# Patient Record
Sex: Female | Born: 1950
Health system: Southern US, Community
[De-identification: ages and names within clinical notes are randomized; demographics above are authoritative.]

## PROBLEM LIST (undated history)

## (undated) DIAGNOSIS — M5030 Other cervical disc degeneration, unspecified cervical region: Secondary | ICD-10-CM

## (undated) DIAGNOSIS — K5790 Diverticulosis of intestine, part unspecified, without perforation or abscess without bleeding: Secondary | ICD-10-CM

## (undated) DIAGNOSIS — G4730 Sleep apnea, unspecified: Secondary | ICD-10-CM

## (undated) DIAGNOSIS — Z8614 Personal history of Methicillin resistant Staphylococcus aureus infection: Secondary | ICD-10-CM

## (undated) DIAGNOSIS — L409 Psoriasis, unspecified: Secondary | ICD-10-CM

## (undated) DIAGNOSIS — R05 Cough: Secondary | ICD-10-CM

## (undated) DIAGNOSIS — I10 Essential (primary) hypertension: Secondary | ICD-10-CM

## (undated) DIAGNOSIS — N189 Chronic kidney disease, unspecified: Secondary | ICD-10-CM

## (undated) DIAGNOSIS — J45909 Unspecified asthma, uncomplicated: Secondary | ICD-10-CM

## (undated) DIAGNOSIS — K219 Gastro-esophageal reflux disease without esophagitis: Secondary | ICD-10-CM

## (undated) DIAGNOSIS — D631 Anemia in chronic kidney disease: Secondary | ICD-10-CM

## (undated) DIAGNOSIS — T7840XA Allergy, unspecified, initial encounter: Secondary | ICD-10-CM

## (undated) DIAGNOSIS — R42 Dizziness and giddiness: Secondary | ICD-10-CM

## (undated) DIAGNOSIS — E785 Hyperlipidemia, unspecified: Secondary | ICD-10-CM

## (undated) DIAGNOSIS — D126 Benign neoplasm of colon, unspecified: Secondary | ICD-10-CM

## (undated) DIAGNOSIS — F419 Anxiety disorder, unspecified: Secondary | ICD-10-CM

## (undated) DIAGNOSIS — F329 Major depressive disorder, single episode, unspecified: Secondary | ICD-10-CM

## (undated) DIAGNOSIS — G4700 Insomnia, unspecified: Secondary | ICD-10-CM

## (undated) DIAGNOSIS — Z860100 Personal history of colon polyps, unspecified: Secondary | ICD-10-CM

## (undated) DIAGNOSIS — R059 Cough, unspecified: Secondary | ICD-10-CM

## (undated) DIAGNOSIS — J302 Other seasonal allergic rhinitis: Secondary | ICD-10-CM

## (undated) DIAGNOSIS — M5137 Other intervertebral disc degeneration, lumbosacral region: Secondary | ICD-10-CM

## (undated) DIAGNOSIS — F32A Depression, unspecified: Secondary | ICD-10-CM

## (undated) DIAGNOSIS — E1140 Type 2 diabetes mellitus with diabetic neuropathy, unspecified: Secondary | ICD-10-CM

## (undated) DIAGNOSIS — Z8709 Personal history of other diseases of the respiratory system: Secondary | ICD-10-CM

## (undated) DIAGNOSIS — M1990 Unspecified osteoarthritis, unspecified site: Secondary | ICD-10-CM

## (undated) DIAGNOSIS — R112 Nausea with vomiting, unspecified: Secondary | ICD-10-CM

## (undated) DIAGNOSIS — R51 Headache: Secondary | ICD-10-CM

## (undated) DIAGNOSIS — Z9889 Other specified postprocedural states: Secondary | ICD-10-CM

## (undated) HISTORY — DX: Anemia in chronic kidney disease: D63.1

## (undated) HISTORY — PX: SHOULDER ARTHROSCOPY: SHX128

## (undated) HISTORY — PX: COLONOSCOPY: SHX174

## (undated) HISTORY — PX: CARDIAC CATHETERIZATION: SHX172

## (undated) HISTORY — DX: Chronic kidney disease, unspecified: N18.9

## (undated) HISTORY — DX: Other cervical disc degeneration, unspecified cervical region: M50.30

## (undated) HISTORY — DX: Benign neoplasm of colon, unspecified: D12.6

## (undated) HISTORY — PX: ELBOW SURGERY: SHX618

## (undated) HISTORY — PX: DILATION AND CURETTAGE OF UTERUS: SHX78

## (undated) HISTORY — PX: POLYPECTOMY: SHX149

## (undated) HISTORY — DX: Diverticulosis of intestine, part unspecified, without perforation or abscess without bleeding: K57.90

## (undated) HISTORY — PX: ESOPHAGOGASTRODUODENOSCOPY: SHX1529

## (undated) HISTORY — PX: TOE AMPUTATION: SHX809

## (undated) HISTORY — DX: Essential (primary) hypertension: I10

## (undated) HISTORY — DX: Allergy, unspecified, initial encounter: T78.40XA

## (undated) HISTORY — DX: Psoriasis, unspecified: L40.9

## (undated) HISTORY — DX: Other intervertebral disc degeneration, lumbosacral region: M51.37

## (undated) HISTORY — DX: Unspecified osteoarthritis, unspecified site: M19.90

## (undated) HISTORY — PX: BACK SURGERY: SHX140

## (undated) HISTORY — PX: ABDOMINAL HYSTERECTOMY: SHX81

## (undated) HISTORY — PX: CERVICAL SPINE SURGERY: SHX589

## (undated) HISTORY — PX: OTHER SURGICAL HISTORY: SHX169

---

## 1975-05-08 HISTORY — PX: CHOLECYSTECTOMY: SHX55

## 1998-03-07 ENCOUNTER — Other Ambulatory Visit: Admission: RE | Admit: 1998-03-07 | Discharge: 1998-03-07 | Payer: Self-pay | Admitting: Obstetrics & Gynecology

## 1998-09-01 ENCOUNTER — Encounter: Admission: RE | Admit: 1998-09-01 | Discharge: 1998-11-24 | Payer: Self-pay | Admitting: Anesthesiology

## 1999-05-02 ENCOUNTER — Other Ambulatory Visit: Admission: RE | Admit: 1999-05-02 | Discharge: 1999-05-02 | Payer: Self-pay | Admitting: Obstetrics & Gynecology

## 1999-06-04 ENCOUNTER — Emergency Department (HOSPITAL_COMMUNITY): Admission: EM | Admit: 1999-06-04 | Discharge: 1999-06-04 | Payer: Self-pay | Admitting: Podiatry

## 1999-07-30 ENCOUNTER — Emergency Department (HOSPITAL_COMMUNITY): Admission: EM | Admit: 1999-07-30 | Discharge: 1999-07-30 | Payer: Self-pay

## 1999-08-10 ENCOUNTER — Encounter: Payer: Self-pay | Admitting: Family Medicine

## 1999-08-10 ENCOUNTER — Encounter: Admission: RE | Admit: 1999-08-10 | Discharge: 1999-08-10 | Payer: Self-pay | Admitting: Family Medicine

## 1999-09-08 ENCOUNTER — Encounter: Admission: RE | Admit: 1999-09-08 | Discharge: 1999-09-08 | Payer: Self-pay | Admitting: Neurological Surgery

## 1999-09-08 ENCOUNTER — Encounter: Payer: Self-pay | Admitting: Neurological Surgery

## 2000-04-04 ENCOUNTER — Other Ambulatory Visit: Admission: RE | Admit: 2000-04-04 | Discharge: 2000-04-04 | Payer: Self-pay | Admitting: Obstetrics & Gynecology

## 2000-05-08 ENCOUNTER — Encounter: Admission: RE | Admit: 2000-05-08 | Discharge: 2000-06-19 | Payer: Self-pay | Admitting: Orthopedic Surgery

## 2000-08-21 ENCOUNTER — Encounter: Admission: RE | Admit: 2000-08-21 | Discharge: 2000-11-19 | Payer: Self-pay | Admitting: Orthopedic Surgery

## 2000-10-09 ENCOUNTER — Encounter: Admission: RE | Admit: 2000-10-09 | Discharge: 2000-10-09 | Payer: Self-pay | Admitting: Orthopedic Surgery

## 2000-10-09 ENCOUNTER — Encounter: Payer: Self-pay | Admitting: Orthopedic Surgery

## 2001-04-09 ENCOUNTER — Other Ambulatory Visit: Admission: RE | Admit: 2001-04-09 | Discharge: 2001-04-09 | Payer: Self-pay | Admitting: Obstetrics and Gynecology

## 2001-05-01 ENCOUNTER — Encounter: Admission: RE | Admit: 2001-05-01 | Discharge: 2001-05-01 | Payer: Self-pay | Admitting: Orthopaedic Surgery

## 2001-05-01 ENCOUNTER — Encounter: Payer: Self-pay | Admitting: Orthopaedic Surgery

## 2001-06-10 ENCOUNTER — Ambulatory Visit (HOSPITAL_BASED_OUTPATIENT_CLINIC_OR_DEPARTMENT_OTHER): Admission: RE | Admit: 2001-06-10 | Discharge: 2001-06-10 | Payer: Self-pay | Admitting: Orthopaedic Surgery

## 2002-08-06 ENCOUNTER — Other Ambulatory Visit: Admission: RE | Admit: 2002-08-06 | Discharge: 2002-08-06 | Payer: Self-pay | Admitting: Obstetrics and Gynecology

## 2002-08-27 ENCOUNTER — Encounter: Admission: RE | Admit: 2002-08-27 | Discharge: 2002-11-25 | Payer: Self-pay | Admitting: Family Medicine

## 2004-07-17 ENCOUNTER — Ambulatory Visit: Admission: RE | Admit: 2004-07-17 | Discharge: 2004-07-17 | Payer: Self-pay | Admitting: Neurosurgery

## 2004-10-27 ENCOUNTER — Inpatient Hospital Stay (HOSPITAL_COMMUNITY): Admission: RE | Admit: 2004-10-27 | Discharge: 2004-10-29 | Payer: Self-pay | Admitting: Neurosurgery

## 2005-01-09 ENCOUNTER — Encounter: Admission: RE | Admit: 2005-01-09 | Discharge: 2005-01-09 | Payer: Self-pay | Admitting: Neurosurgery

## 2005-04-17 ENCOUNTER — Encounter: Admission: RE | Admit: 2005-04-17 | Discharge: 2005-04-17 | Payer: Self-pay | Admitting: Neurosurgery

## 2006-09-05 ENCOUNTER — Encounter: Admission: RE | Admit: 2006-09-05 | Discharge: 2006-09-05 | Payer: Self-pay | Admitting: Neurosurgery

## 2006-09-08 ENCOUNTER — Encounter: Admission: RE | Admit: 2006-09-08 | Discharge: 2006-09-08 | Payer: Self-pay | Admitting: Neurosurgery

## 2008-05-07 HISTORY — PX: CARPAL TUNNEL RELEASE: SHX101

## 2008-09-15 ENCOUNTER — Ambulatory Visit (HOSPITAL_COMMUNITY): Admission: RE | Admit: 2008-09-15 | Discharge: 2008-09-15 | Payer: Self-pay | Admitting: Neurosurgery

## 2008-10-25 ENCOUNTER — Ambulatory Visit (HOSPITAL_COMMUNITY): Admission: RE | Admit: 2008-10-25 | Discharge: 2008-10-26 | Payer: Self-pay | Admitting: Neurosurgery

## 2010-06-27 ENCOUNTER — Other Ambulatory Visit: Payer: Self-pay | Admitting: Orthopaedic Surgery

## 2010-06-27 DIAGNOSIS — M25512 Pain in left shoulder: Secondary | ICD-10-CM

## 2010-06-29 ENCOUNTER — Ambulatory Visit
Admission: RE | Admit: 2010-06-29 | Discharge: 2010-06-29 | Disposition: A | Discharge status: Home / Self Care | Payer: BC Managed Care – PPO | Source: Ambulatory Visit | Attending: Orthopaedic Surgery | Admitting: Orthopaedic Surgery

## 2010-06-29 DIAGNOSIS — M25512 Pain in left shoulder: Secondary | ICD-10-CM

## 2010-08-14 LAB — POCT I-STAT EG7
Calcium, Ion: 1.18 mmol/L (ref 1.12–1.32)
Hemoglobin: 9.5 g/dL — ABNORMAL LOW (ref 12.0–15.0)
O2 Saturation: 86 %
Potassium: 4.3 mEq/L (ref 3.5–5.1)
Sodium: 139 mEq/L (ref 135–145)
pH, Ven: 7.401 — ABNORMAL HIGH (ref 7.250–7.300)

## 2010-08-14 LAB — COMPREHENSIVE METABOLIC PANEL
Albumin: 3.4 g/dL — ABNORMAL LOW (ref 3.5–5.2)
BUN: 20 mg/dL (ref 6–23)
CO2: 27 mEq/L (ref 19–32)
Calcium: 9.3 mg/dL (ref 8.4–10.5)
Chloride: 102 mEq/L (ref 96–112)
Sodium: 139 mEq/L (ref 135–145)
Total Bilirubin: 0.4 mg/dL (ref 0.3–1.2)
Total Protein: 6.2 g/dL (ref 6.0–8.3)

## 2010-08-14 LAB — CBC
HCT: 30.9 % — ABNORMAL LOW (ref 36.0–46.0)
Hemoglobin: 10.6 g/dL — ABNORMAL LOW (ref 12.0–15.0)
RBC: 3.35 MIL/uL — ABNORMAL LOW (ref 3.87–5.11)
WBC: 8.9 10*3/uL (ref 4.0–10.5)

## 2010-08-14 LAB — GLUCOSE, CAPILLARY
Glucose-Capillary: 146 mg/dL — ABNORMAL HIGH (ref 70–99)
Glucose-Capillary: 160 mg/dL — ABNORMAL HIGH (ref 70–99)
Glucose-Capillary: 220 mg/dL — ABNORMAL HIGH (ref 70–99)

## 2010-08-15 LAB — CBC
HCT: 30.2 % — ABNORMAL LOW (ref 36.0–46.0)
MCV: 92 fL (ref 78.0–100.0)
Platelets: 206 10*3/uL (ref 150–400)
WBC: 5.2 10*3/uL (ref 4.0–10.5)

## 2010-08-15 LAB — BASIC METABOLIC PANEL
CO2: 27 mEq/L (ref 19–32)
Chloride: 102 mEq/L (ref 96–112)
GFR calc Af Amer: >60 mL/min (ref >60)
GFR calc non Af Amer: >60 mL/min (ref >60)

## 2010-09-11 ENCOUNTER — Encounter (HOSPITAL_COMMUNITY)
Admission: RE | Admit: 2010-09-11 | Discharge: 2010-09-11 | Disposition: A | Discharge status: Home / Self Care | Payer: BC Managed Care – PPO | Source: Ambulatory Visit | Attending: Neurosurgery | Admitting: Neurosurgery

## 2010-09-11 ENCOUNTER — Ambulatory Visit (HOSPITAL_COMMUNITY)
Admission: RE | Admit: 2010-09-11 | Discharge: 2010-09-11 | Disposition: A | Discharge status: Home / Self Care | Payer: BC Managed Care – PPO | Source: Ambulatory Visit | Attending: Neurosurgery | Admitting: Neurosurgery

## 2010-09-11 ENCOUNTER — Other Ambulatory Visit (HOSPITAL_COMMUNITY): Payer: Self-pay | Admitting: Neurosurgery

## 2010-09-11 DIAGNOSIS — E119 Type 2 diabetes mellitus without complications: Secondary | ICD-10-CM | POA: Insufficient documentation

## 2010-09-11 DIAGNOSIS — M5020 Other cervical disc displacement, unspecified cervical region: Secondary | ICD-10-CM

## 2010-09-11 DIAGNOSIS — Z01818 Encounter for other preprocedural examination: Secondary | ICD-10-CM | POA: Insufficient documentation

## 2010-09-11 DIAGNOSIS — Z01812 Encounter for preprocedural laboratory examination: Secondary | ICD-10-CM | POA: Insufficient documentation

## 2010-09-11 DIAGNOSIS — R05 Cough: Secondary | ICD-10-CM | POA: Insufficient documentation

## 2010-09-11 DIAGNOSIS — G5600 Carpal tunnel syndrome, unspecified upper limb: Secondary | ICD-10-CM | POA: Insufficient documentation

## 2010-09-11 DIAGNOSIS — R059 Cough, unspecified: Secondary | ICD-10-CM | POA: Insufficient documentation

## 2010-09-11 DIAGNOSIS — G5601 Carpal tunnel syndrome, right upper limb: Secondary | ICD-10-CM

## 2010-09-11 DIAGNOSIS — I10 Essential (primary) hypertension: Secondary | ICD-10-CM | POA: Insufficient documentation

## 2010-09-11 DIAGNOSIS — Z01810 Encounter for preprocedural cardiovascular examination: Secondary | ICD-10-CM | POA: Insufficient documentation

## 2010-09-11 LAB — CBC
MCV: 91.6 fL (ref 78.0–100.0)
Platelets: 254 10*3/uL (ref 150–400)
WBC: 8.6 10*3/uL (ref 4.0–10.5)

## 2010-09-11 LAB — BASIC METABOLIC PANEL
Chloride: 102 mEq/L (ref 96–112)
Creatinine, Ser: 1.03 mg/dL (ref 0.4–1.2)
GFR calc Af Amer: >60 mL/min (ref >60)
Glucose, Bld: 219 mg/dL — ABNORMAL HIGH (ref 70–99)
Potassium: 5 mEq/L (ref 3.5–5.1)

## 2010-09-18 ENCOUNTER — Ambulatory Visit (HOSPITAL_COMMUNITY)
Admission: RE | Admit: 2010-09-18 | Discharge: 2010-09-19 | Disposition: A | Discharge status: Home / Self Care | Payer: BC Managed Care – PPO | Source: Ambulatory Visit | Attending: Neurosurgery | Admitting: Neurosurgery

## 2010-09-18 ENCOUNTER — Observation Stay (HOSPITAL_COMMUNITY): Payer: BC Managed Care – PPO

## 2010-09-18 DIAGNOSIS — M47812 Spondylosis without myelopathy or radiculopathy, cervical region: Secondary | ICD-10-CM | POA: Insufficient documentation

## 2010-09-18 DIAGNOSIS — E669 Obesity, unspecified: Secondary | ICD-10-CM | POA: Insufficient documentation

## 2010-09-18 DIAGNOSIS — Z01810 Encounter for preprocedural cardiovascular examination: Secondary | ICD-10-CM | POA: Insufficient documentation

## 2010-09-18 DIAGNOSIS — Z23 Encounter for immunization: Secondary | ICD-10-CM | POA: Insufficient documentation

## 2010-09-18 DIAGNOSIS — Z01818 Encounter for other preprocedural examination: Secondary | ICD-10-CM | POA: Insufficient documentation

## 2010-09-18 DIAGNOSIS — G5600 Carpal tunnel syndrome, unspecified upper limb: Secondary | ICD-10-CM | POA: Insufficient documentation

## 2010-09-18 DIAGNOSIS — M25519 Pain in unspecified shoulder: Secondary | ICD-10-CM | POA: Insufficient documentation

## 2010-09-18 DIAGNOSIS — E119 Type 2 diabetes mellitus without complications: Secondary | ICD-10-CM | POA: Insufficient documentation

## 2010-09-18 DIAGNOSIS — G4733 Obstructive sleep apnea (adult) (pediatric): Secondary | ICD-10-CM | POA: Insufficient documentation

## 2010-09-18 DIAGNOSIS — K219 Gastro-esophageal reflux disease without esophagitis: Secondary | ICD-10-CM | POA: Insufficient documentation

## 2010-09-18 DIAGNOSIS — I10 Essential (primary) hypertension: Secondary | ICD-10-CM | POA: Insufficient documentation

## 2010-09-18 DIAGNOSIS — Z01812 Encounter for preprocedural laboratory examination: Secondary | ICD-10-CM | POA: Insufficient documentation

## 2010-09-18 LAB — GLUCOSE, CAPILLARY
Glucose-Capillary: 253 mg/dL — ABNORMAL HIGH (ref 70–99)
Glucose-Capillary: 264 mg/dL — ABNORMAL HIGH (ref 70–99)

## 2010-09-19 LAB — GLUCOSE, CAPILLARY: Glucose-Capillary: 174 mg/dL — ABNORMAL HIGH (ref 70–99)

## 2010-09-19 NOTE — Op Note (Signed)
[name], [name]               ACCOUNT NO.:  [id number]   MEDICAL RECORD NO.:  [id number]          PATIENT TYPE:  INP   LOCATION:  3533                         FACILITY:  [facility]   PHYSICIAN:  Donalee Citrin, M.D.        DATE OF BIRTH:  Oct 30, 1950   DATE OF PROCEDURE:  10/25/2008  DATE OF DISCHARGE:                               OPERATIVE REPORT   PREOPERATIVE DIAGNOSIS:  Cervical spondylosis with stenosis on the left  side at C5, C6, and C7 radiculopathies from severe stenosis at C4-5, C5-  6, and C6-7.   PROCEDURE:  Intracervical diskectomies and fusion at C4-5, C5-6, and C6-  7 using 6-mm allograft at C4-5 and 7 mm at C5-6 and C6-7.   SURGEON:  Donalee Citrin, MD   ASSISTANT:  Kathaleen Maser. Pool, MD   ANESTHESIA:  General endotracheal.   HISTORY OF PRESENT ILLNESS:  The patient is a very pleasant 60 year old  female who has had a long-standing neck and bilateral arm pain, worse in  the left shoulder and left arm, going on now for several weeks to  months, failed all forms of conservative treatment.  MRI scan showed  severe cervical spondylosis with stenosis at C4-5, 5-6, and 6-7 with  spinal cord compression and biforaminal stenosis but worse on the left.  The patient failed conservative treatment and was recommended anterior  cervical diskectomy with fusion.  Risks and benefits of the operation  were explained to the patient.  She understand and agree to proceed  forward.   The patient was brought to the OR and induced under anesthesia,  positioned supine with the neck in slight extension, 5 pounds of Holter  traction.  The right side of the neck was prepped and draped in the  routine sterile fashion.  Preop x-ray localized to the appropriate  level.  A curvilinear incision was made just off to the midline to the  anterior border of the sternocleidomastoid.  The superficial layer of  the platysma was dissected out and divided longitudinally.  The  avascular plane between the  sternocleidomastoid and strap muscles was  developed down to the prevertebral fascia.  Prevertebral fascia was then  dissected with Kittner.  The intraoperative x-ray confirmed localization  at the appropriate level, so annulotomy was made at C4-5, C5-6, C6-C7  and marked the disk space, the longus colli was reflected laterally and  self-retaining retractor was placed.  All three interspaces were then  incised.  Large anterior osteophytes were bitten off the C4, C5, and C6  vertebral bodies respectively to identify the disk space.  The high-  speed drill was used to drill down the disk space to the posterior  annulus and osteophyte complex at all levels.  At this point, the  operative microscope was draped, brought into the field, and under  microscopic illumination first working at C6-7 the interspace was  further drilled down to the PLL which was removed in piecemeal fashion  with a 1-mm Kerrison punch, identifying a very large posterior spur  coming off the C6 vertebral body, this was all aggressively underbitten  decompressing the central canal, marching across down both uncinate  processes was removed, decompressing both C7 nerve roots, flushed with  pedicle.  After adequate diskectomy and decompression have been achieved  at this level, this was packed with Gelfoam, attention was taken to C5-  6.  In a similar fashion, C5-6 was drilled down.  Aggressive underbiting  of both C5-C6 vertebral bodies.  Marching across, there was a very large  bone spur coming off the uncinate process on the left causing severe  stenosis of the left C6 nerve root.  This was all aggressively  underbitten decompressing the left C6 nerve root, easily accepting a  nerve hook out the foramen.  This was then achieved also on the right  side, and after both C6 neural foramina were patent and then the central  canal was decompressed the 8-mm graft was then inserted after endplates  were prepped and scraped.   Then, working at C4-5 in a similar fashion  again predominantly a central spur with some extension towards the  right.  At C4-5, this was all aggressively underbitten.  The left C5  nerve root had extensive amount of foraminal stenosis.  This was all  aggressively underbitten, taking care not to injure the C5 nerve root  too significantly but enough to decompress the root and opened up the  foramen easily accepting a nerve hook.  After adequate foraminotomies  were achieved at both levels and the disk space was prepped, a 6-mm  graft was inserted at C4-5, the 7 mm was inserted at C6-7, and then the  wound was copiously irrigated and meticulous hemostasis was maintained.  A 57.5-mm Atlantis translational plate was collapsing down the superior  section was appropriate sizing.  Then, all 8 screws were placed.  All  screws had excellent purchase.  Locking mechanism was engaged.  Postop  fluoroscopy confirmed good position of plate, bone grafts, screws.  Then, the wound was closed in layers with interrupted Vicryl in the  platysma in a running 4-0 subcuticular.  Benzoin and Steri-Strips were  applied.  The patient went to recovery room in stable condition.  At the  end of the case, sponge and instrument count were correct.           ______________________________  Donalee Citrin, M.D.     GC/MEDQ  D:  10/25/2008  T:  10/26/2008  Job:  [id number]

## 2010-09-22 NOTE — Op Note (Signed)
[place]  Patient:    [name], [name] Visit Number: [id number] MRN: [id number]          Service Type: DSU Location: [facility] Attending Physician:  Marcene Corning Dictated by:   Lubertha Basque. Jerl Santos, M.D. Proc. Date: 06/10/01 Admit Date:  06/10/2001                             Operative Report  NO DICTATION. Dictated by:   Lubertha Basque Jerl Santos, M.D. Attending Physician:  Marcene Corning DD:  06/10/01 TD:  06/10/01 Job: [id number]

## 2010-09-22 NOTE — Op Note (Signed)
[place]. [facility]  Patient:    [name], [name] Visit Number: [id number] MRN: [id number]          Service Type: DSU Location: [facility] Attending Physician:  Marcene Corning Dictated by:   Lubertha Basque. Jerl Santos, M.D. Proc. Date: 06/10/01 Admit Date:  06/10/2001                             Operative Report  PREOPERATIVE DIAGNOSIS: 1. Right shoulder impingement. 2. Right shoulder rotator cuff tear.  POSTOPERATIVE DIAGNOSIS: 1. Right shoulder impingement. 2. Right shoulder acromioclavicular impingement. 3. Right shoulder labral tear.  OPERATION PERFORMED: 1. Right shoulder acromioplasty. 2. Right shoulder partial claviculectomy. 3. Right shoulder arthroscopic debridement.  ANESTHESIA:  General and block.  ATTENDING SURGEON:  Lubertha Basque. Jerl Santos, M.D.  ASSISTANT:  Lindwood Qua, P.A.  INDICATIONS FOR PROCEDURE:  The patient is a 60 year old woman with a long history of right shoulder pain.  This has persisted despite oral anti-inflammatories and activity restriction.  She also achieved transient relief from injection x 2.  She underwent a preoperative MRI scan which showed some irritation of her rotator cuff with a probable small tear.  She was offered an arthroscopy at this point.  The procedure was discussed with the patient and informed operative consent was obtained after discussion of possible complications of reaction to anesthesia and infection.  DESCRIPTION OF PROCEDURE:  The patient was taken to an operating suite where general anesthetic was applied after a block was given in the preanesthesia area.  She was then positioned in a beach chair position and prepped and draped in normal sterile fashion.  After the administration of preop intravenous antibiotics an arthroscopy of the right shoulder was performed through a total of three portals.  Glenohumeral joint exhibited no degenerative change and the biceps tendon was attached so there was a  type one SLAP lesion which was addressed with a debridement.  The rotator cuff appeared completely benign on undersurface inspection as did the biceps tendon and the inferior labrum.  The subacromial space was then entered.  She had some significant irritation of the rotator cuff but the entire structure was well visualized after a thorough bursectomy and no tear was found.  She had a prominent lateral subacromial tilt and this was addressed with an acromioplasty back to a flat surface.  This was done the bur in the lateral position followed by transfer of the bur to the posterior position.  She also appeared to be exhibiting some impingement from the distal clavicle and a partial claviculectomy was performed without formal AC decompression.  The shoulder was thoroughly irrigated followed placement of Marcaine with epinephrine and morphine.  Simple sutures of nylon were used to loosely reapproximate the portals followed by Adaptic and a dry gauze dressing with tape.  Estimated blood loss and intraoperative fluids can be obtained from Anesthesia records.   It should be noted that we did not use any Betadine during the prep due to an allergy and used Hibiclens scrub.  DISPOSITION:  The patient was extubated in the operating room and taken to the recovery room in stable condition.  Plans were for her to go home the same day and to follow up in the office in less than a week.  I will contact her by phone tonight. Dictated by:   Lubertha Basque Jerl Santos, M.D. Attending Physician:  Marcene Corning DD:  06/10/01 TD:  06/10/01 Job: [id number]

## 2010-09-22 NOTE — Op Note (Signed)
NAMECUMI, SANAGUSTIN NO.:  [id number]   MEDICAL RECORD NO.:  [id number]          PATIENT TYPE:  INP   LOCATION:  3009                         FACILITY:  [facility]   PHYSICIAN:  Donalee Citrin, M.D.        DATE OF BIRTH:  [birth year]   DATE OF PROCEDURE:  10/27/2004  DATE OF DISCHARGE:  10/29/2004                                 OPERATIVE REPORT   PREOPERATIVE DIAGNOSIS:  Severe lumbar spinal stenosis.   POSTOPERATIVE DIAGNOSIS:  Severe lumbar spinal stenosis with lumbar spinal  stenosis with spinal instability.   PROCEDURE:  1.  Decompressive lumbar laminectomy, L2-3 and L3-4, in excess of what is      needed for a standard interbody fusion.  2.  Posterior lumbar interbody fusion, L2-3 and L3-4, using a 10 x 22 mm      Telamon Peek cage packed with autograft and DBX on the left side, and      the right side was a 10 x 26 mm Tangent autograft wedge at L4-5, and the      same was repeated with a 10 x 22 Telamon on the right and the Tangent on      the left.  3.  Pedicle screw fixation, L2-3 and L3-4, using the M10 Legacy pedicle      screw system.  4.  Posterolateral arthrodesis, L2-3 and L3-4.  5.  Open reduction of spinal deformity, L2-3.  6.  Placement of medium Hemovac drain.   SURGEON:  Donalee Citrin, MD.   ASSISTANT:  Reinaldo Meeker, MD.   ANESTHESIA:  General endotracheal.   HISTORY OF PRESENT ILLNESS:  The patient is a very pleasant, 60 year old  female, who has longstanding back and bilateral leg pain with neurogenic  claudication at approximately half a block.  The patient failed all forms of  conservative treatment with __________  showed severe lumbar spinal stenosis  with superior thecal sac compression and biforaminal stenosis of the L2-3-4  nerve roots.  The patient was progressing with increased difficulty  ambulating, and patient was recommended for a radical decompressive  stabilization procedure as the patient's back pain was much greater than  her  leg pain, however, with significant leg pain and claudication.  The risks  and benefits were explained to the patient, and she understood and agreed to  proceed forward.   DESCRIPTION OF PROCEDURE:  The patient was brought to the OR and was induced  under general anesthesia.  She was turned prone onto a Wilson frame, and the  back was prepped and draped in the routine sterile fashion.  The patient had  had previous L4-5 and L5-S1 onlay __________ fusion, so her incision was  opened up superior to this exposing the 2, 3, and 4 laminae.  After the  laminae of 2, 3, and 4 were exposed and the __________ at 2, 3, and 4 were  exposed, then __________ mobilization of the L2 pedicle.  The L3 laminar  complex and the L2 laminar complex were remarkably hypermobile with large,  degenerative facets with medial ingrowth with an extensive amount  of scar  tissue overlaying the L3 lamina.  There was also noted marked kyphotic  deformity at the 2-3 and 3-4 disk spaces, so after the exposure was obtained  the spinous processes of L2 and L3 were removed, exposing the 2-3 and 3-4  disk spaces.  Then, the ligament was markedly hypertrophied, and the facets  were markedly incompetent.  These were all removed in a piecemeal fashion,  and a radical decompression was obtained.  Now predominantly at L2-3, there  was noted to be a very large degenerative facet complex and bony overgrowth  causing an hourglass compression of the thecal sac in the 2 and 3 nerve  roots on that side.  This was also markedly hypertrophic at the L3-4 disk  spaces, so using care with a #4 Penfield dissecting the plane between the  dura and the undersurface of the second __________ .  This was all  underbitten and the entire medial facet complexes at 2-3 and 3-4 were  removed.  This exposed the proximal 2, 3, and 4 nerve roots.  The L2 nerve  root was completely unroofed out the foramen and flush with the L2 pedicle,  and the L3  nerve root also was decompressed out through the foramen and  flush with the L3 pedicle.  This procedure was repeated on the left side  until and after all 6 nerve roots of L2, 3, and 4 on both sides were all  completely unroofed and decompressed.  Attention was taken to the pedicle  screw placement.  Using fluoroscopy and a high-speed drill, pilot holes were  drilled in the L2 pedicle, cannulated with the all probed tap with a 5.5  tap, and a 6.5 x 40 pedicle screw was inserted in L2 on the right.  The  procedure was repeated at 3 and 4 on the right with 6.5 x 40 screws inserted  at those levels as well.  All screws had excellent purchase and were probed  from within the pedicle as well as in the canal confirming the medial  lateral breach.  Then at L2, 3, and 4 on the left side, this procedure was  repeated and all screws had excellent purchase.  After all six screws were  placed, attention was taken to the interbody work first on the right side of  L2-3.  The right L3 nerve root was reflected medially.  The disk was noted  to be markedly collapsed.  It was incised with a long blade scalpel.  Pituitary rongeurs were attempted to be entered the disk space and were  barely able to fit.  Then a size 7 distractor was inserted and this opened  up the disk space.  The disk space was noted to be markedly hypermobile and  easily distracted with a 7, so this was stepped up to an 8 and a 9 and  subsequently a 10 distractor, which completely opened up the 2 and the 3  foramen. This was done in a stepwise fashion and the disk space was noted to  be markedly reduced, and the kyphotic deformity was reduced.  Then, with the  pin distractor in place, a size 10 cutter and chisel were used to prepare  the end plates to remove the disk on the left side.  Several fragments were  removed from the central compartment as well as the lateral compartment. After the disk end plates were scraped and the downgoing  Epstein curette as  well as the Telamon curettes, the Telamon  cage, the 10 x 22 mm Peek cage,  packed with bone graft, and DBX was inserted approximately __________  posterior vertebral line confirmed by fluoroscopy.  Then the distractor was  removed and the procedure repeated on the right side at L2-3, and on the  right side a locally harvested autograft packed against the Telamon on the  left and the right side allograft was inserted.  This was noted to have a  significant opening and reduction of the 2-3 disk space and the kyphotic  deformity than pictures taken of 3-4.  At L3-4, the L4 nerve root was  reflected medially on the left side.  Pituitary rongeurs were wrapped within  the disk space.  Several large fragments of disk were removed from this  level and a downgoing Epstein curette was used to clean out the central disk  spaces, then a size 10 distractor was inserted at this level, and this was  noted to have good apposition of the end plates.  Then on the right side,  the L4 nerve root was reflected medially and the disk space was again was  cleaned out and several large fragments were removed from the lateral  compartment at this level.  A size 10 cutter and chisel were used to prepare  the end plates.  A 10 x 22 mm Telamon cage again packed with a locally  harvested autograft and DBX were inserted in the right side.  Then on the  left side, the procedure was repeated and the autograft was inserted after a  locally harvested autograft was packed against the Telamon.  Then after all  grafts had been placed, fluoroscopy confirmed good position of bone grafts  as well as direct inspection of the disk spaces within the canal.  I did  undertake a copious irrigation and an aggressive decortication was carried  laterally.  __________ cutters and what  remained of the DBX mixed with  locally harvested autograft was packed in the lateral gutters from L2 to L4.  Then the prods were  inserted, the __________ tightened down at L4, the L3  pedicle screw was compressed against the L4, and the L2 compressed against  L3.  This again reduced some of the kyphotic deformity at L2-3 and a 4 x 22  cross link was inserted.  The foramina were re-explored with a hockey stick  and noted to be widely patent, confirming the migration of graft material.  Then again fluoroscopy confirmed good  position of bone graft screws and rods.  Gelfoam was overlaid on top of the  dura and the muscle fascia was reapproximated with 0 interrupted Vicryl and  a medium Hemovac drain was placed.  The __________ running 4-0 subcuticular  in the midline, and Steri-Strips applied.  The patient went to the recovery  room in stable condition.       GC/MEDQ  D:  10/29/2004  T:  10/29/2004  Job:  [id number]

## 2010-10-03 ENCOUNTER — Other Ambulatory Visit: Payer: Self-pay | Admitting: Neurosurgery

## 2010-10-03 ENCOUNTER — Ambulatory Visit
Admission: RE | Admit: 2010-10-03 | Discharge: 2010-10-03 | Disposition: A | Discharge status: Home / Self Care | Payer: BC Managed Care – PPO | Source: Ambulatory Visit | Attending: Neurosurgery | Admitting: Neurosurgery

## 2010-10-03 DIAGNOSIS — M542 Cervicalgia: Secondary | ICD-10-CM

## 2010-10-03 MED ORDER — IOHEXOL 300 MG/ML  SOLN
75.0000 mL | Freq: Once | INTRAMUSCULAR | Status: AC | PRN
  Administered 2010-10-03: 75 mL via INTRAVENOUS

## 2010-10-06 NOTE — Op Note (Signed)
[name], [name] NO.:  [id number]  MEDICAL RECORD NO.:  [id number]           PATIENT TYPE:  O  LOCATION:  XRAY                         FACILITY:  [facility]  PHYSICIAN:  Donalee Citrin, M.D.        DATE OF BIRTH:  1951/01/10  DATE OF PROCEDURE:  09/18/2010 DATE OF DISCHARGE:                              OPERATIVE REPORT   PREOPERATIVE DIAGNOSES:  Cervical spondylosis with stenosis at C3-4 with left-sided neck pain and shoulder pain in the webspace of her left neck.  SECONDARY DIAGNOSIS:  Right carpal tunnel syndrome.  PROCEDURE:  Intracervical diskectomy and fusion using the globus PEEK cage with anterior screw fixation through the PEEK cage and locally harvested autograft mixed with Actifuse and secondary procedure was right carpal tunnel release.  HISTORY OF PRESENT ILLNESS:  The patient is a very pleasant 60 year old female who has had longstanding neck and predominant left shoulder and arm pain.  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  Due to the patient's fracture, as treatment the patient is recommended anterior cervical discectomy and fusion at C3- 4 as well as the right carpal tunnel release.  We went over the risks and benefits of the operation with her.  She understands and agreed to proceed forward.  The patient was brought to the OR, was induced under general anesthesia, positioned supine with the neck in slight extension and 5 pounds of Halter traction.  Right side of her neck was prepped and draped in usual sterile fashion.  Preoperative x-ray localized the appropriate level, so a curvilinear incision was made just off midline to the anterior border of  sternocleidomastoid.  The superficial layer of the platysma was dissected out and divided longitudinally.  The avascular plane between the sternomastoid and the strap muscle  was developed down to the prevertebral fascia.  Prevertebral fascia was dissected with Kitners.  The plate was immediately identified.  The longus colli was reflected laterally in the disk space immediately and superior to the plate was dissected free and self-retaining retractor was placed.  Disk space was marked degenerated, large anterior osteophytes were bitten off with  Leksell rongeur.  Disk space was then drilled down capturing the bone shavings in mucus trap.  Then under microscope illumination, aggressive under biting of both endplates were carried out decompressing the central canal.  PLL was removed in piecemeal fashion, marched across laterally at level of C4 pedicle and both C4 nerve roots were identified.  Both C4 neural foramina were opened up, especially the left C4 nerve root was skeletonized, flushed with pedicle.  After adequate endplate perforation had been achieved, a PEEK cage from globus with the embedded screw hole construct was selected, this was 8-mm lordotic.  This was packed with local autograft mixed with Actifuse and inserted 1 mm deep to the anterior vertebral body line.  The awl was used to cannulate through the peak cage under fluoroscopy to ensure good depth and trajectory.  On initial placement of the cage, the inferior screw was biting  up against the screws of the plates, so the cage was repositioned and new holes were cannulated.  All screws had excellent purchase.  Locking mechanism was engaged.  The wound was then copiously irrigated and meticulous hemostasis was maintained.  The wound was then closed in layers with interrupted Vicryl and platysma with running forceps.  The patient was then repositioned for a right carpal tunnel release. The right hand was prepped and draped in usual sterile fashion. Incision was drawn about 4 cm long from the distal crease of the wrist along the palmar crease towards the middle finger.  This was infiltrated with 6  mL of lidocaine with epi.  A incision was made with #15 blade scalpel.  Bipolar cautery was used to maintain hemostasis.  The flexor retinaculum and transverse carpal ligament was immediately identified and divided sequentially in layers until the epineurium and median nerve was visualized and then using hemostat a plane underneath the significantly thickened and hypertrophied flexor retinaculum and transverse carpal ligament was developed over the median nerve which was then divided in piecemeal fashion both proximally and distally until easily accepting a hemostat both proximally and distally.  The wound was then copiously irrigated.  Meticulous hemostasis was maintained.  The skin was reapproximated with interrupted vertical mattress and skin was then closed with interrupted vertical mattress.  The hand was then dressed in bacitracin, Adaptic, fluffs, Kerlix roll, and Kling roll and the patient went to recovery room in stable condition.  At the end of the case, all sponge and instrument count were correct.          ______________________________ Donalee Citrin, M.D.     GC/MEDQ  D:  09/18/2010  T:  09/18/2010  Job:  [id number]  Electronically Signed by Donalee Citrin M.D. on 10/06/2010 07:04:00 AM

## 2010-10-25 ENCOUNTER — Ambulatory Visit (HOSPITAL_COMMUNITY)
Admission: RE | Admit: 2010-10-25 | Discharge: 2010-10-25 | Disposition: A | Discharge status: Home / Self Care | Payer: BC Managed Care – PPO | Source: Ambulatory Visit | Attending: Neurosurgery | Admitting: Neurosurgery

## 2010-10-25 ENCOUNTER — Other Ambulatory Visit (HOSPITAL_COMMUNITY): Payer: Self-pay | Admitting: Neurosurgery

## 2010-10-25 ENCOUNTER — Encounter (HOSPITAL_COMMUNITY)
Admission: RE | Admit: 2010-10-25 | Discharge: 2010-10-25 | Disposition: A | Discharge status: Home / Self Care | Payer: BC Managed Care – PPO | Source: Ambulatory Visit | Attending: Neurosurgery | Admitting: Neurosurgery

## 2010-10-25 DIAGNOSIS — Z01812 Encounter for preprocedural laboratory examination: Secondary | ICD-10-CM | POA: Insufficient documentation

## 2010-10-25 DIAGNOSIS — T86830 Bone graft rejection: Secondary | ICD-10-CM

## 2010-10-25 DIAGNOSIS — Z01818 Encounter for other preprocedural examination: Secondary | ICD-10-CM | POA: Insufficient documentation

## 2010-10-25 LAB — BASIC METABOLIC PANEL
BUN: 23 mg/dL (ref 6–23)
Chloride: 98 mEq/L (ref 96–112)
Glucose, Bld: 225 mg/dL — ABNORMAL HIGH (ref 70–99)
Potassium: 4.5 mEq/L (ref 3.5–5.1)

## 2010-10-25 LAB — CBC
HCT: 33.7 % — ABNORMAL LOW (ref 36.0–46.0)
Hemoglobin: 11.2 g/dL — ABNORMAL LOW (ref 12.0–15.0)
WBC: 8.4 10*3/uL (ref 4.0–10.5)

## 2010-10-27 ENCOUNTER — Ambulatory Visit (HOSPITAL_COMMUNITY): Payer: BC Managed Care – PPO

## 2010-10-27 ENCOUNTER — Ambulatory Visit (HOSPITAL_COMMUNITY)
Admission: RE | Admit: 2010-10-27 | Discharge: 2010-10-28 | DRG: 865 | Disposition: A | Discharge status: Home / Self Care | Payer: BC Managed Care – PPO | Source: Ambulatory Visit | Attending: Neurosurgery | Admitting: Neurosurgery

## 2010-10-27 DIAGNOSIS — Y92009 Unspecified place in unspecified non-institutional (private) residence as the place of occurrence of the external cause: Secondary | ICD-10-CM

## 2010-10-27 DIAGNOSIS — T84498A Other mechanical complication of other internal orthopedic devices, implants and grafts, initial encounter: Secondary | ICD-10-CM | POA: Diagnosis present

## 2010-10-27 DIAGNOSIS — G4733 Obstructive sleep apnea (adult) (pediatric): Secondary | ICD-10-CM | POA: Diagnosis present

## 2010-10-27 DIAGNOSIS — E669 Obesity, unspecified: Secondary | ICD-10-CM | POA: Diagnosis present

## 2010-10-27 DIAGNOSIS — Z79899 Other long term (current) drug therapy: Secondary | ICD-10-CM | POA: Insufficient documentation

## 2010-10-27 DIAGNOSIS — I10 Essential (primary) hypertension: Secondary | ICD-10-CM | POA: Diagnosis present

## 2010-10-27 DIAGNOSIS — E119 Type 2 diabetes mellitus without complications: Secondary | ICD-10-CM | POA: Diagnosis present

## 2010-10-27 DIAGNOSIS — Y831 Surgical operation with implant of artificial internal device as the cause of abnormal reaction of the patient, or of later complication, without mention of misadventure at the time of the procedure: Secondary | ICD-10-CM | POA: Diagnosis present

## 2010-10-27 DIAGNOSIS — J45909 Unspecified asthma, uncomplicated: Secondary | ICD-10-CM | POA: Diagnosis present

## 2010-10-27 LAB — GLUCOSE, CAPILLARY
Glucose-Capillary: 164 mg/dL — ABNORMAL HIGH (ref 70–99)
Glucose-Capillary: 235 mg/dL — ABNORMAL HIGH (ref 70–99)
Glucose-Capillary: 242 mg/dL — ABNORMAL HIGH (ref 70–99)
Glucose-Capillary: 329 mg/dL — ABNORMAL HIGH (ref 70–99)

## 2010-11-02 NOTE — Op Note (Signed)
NAMEROSALEAH, PERSON NO.:  [id number]  MEDICAL RECORD NO.:  [id number]  LOCATION:  3528                         FACILITY:  [facility]  PHYSICIAN:  Donalee Citrin, M.D.        DATE OF BIRTH:  1950/10/13  DATE OF PROCEDURE:  10/27/2010 DATE OF DISCHARGE:                              OPERATIVE REPORT   PREOPERATIVE DIAGNOSIS:  Failed fusion with displaced bone graft and hardware at C3-4  PROCEDURE:  Anterior cervical fusion revision at C3-4 with exploration of fusion, removal of hardware from C4-C7.  SURGEON:  Donalee Citrin, MD  ASSISTANT:  Yetta Barre.  ANESTHESIA:  General endotracheal.  HISTORY OF PRESENT ILLNESS:  The patient is a 59-year female who had undergone previous anterior cervical diskectomy fusion at C3-4 using the PEEK cage with imbedded screws above an old fusion at C4-C7.  The patient initially did very well.  However in serial radiographs postoperatively, it was noted that the patient had displacement of the PEEK graft with fracture of the anterior-inferior endplate of C3, so the patient is recommended reexploration of anterior cervical fusion and redo-anterior cervical fusion at C3-4.  We recommended exploration of fusion, removal of hardware from C4-C7.  The risks and benefits of the operation were explained to the patient.  She understood and agreed to proceed forth.  The patient was brought into the OR, was induced general anesthesia, positioned supine, neck flexed slightly and 5 pounds of Holter traction. Her old incision was opened up and localized and then working through the scar tissue the avascular between sternomastoid and strap muscle was developed down to prevertebral fascia.  Prevertebral fascia was dissected with Kittners.  The old plate was identified.  This was dissected free with Kittners and Bovie cautery.  The PEEK graft was also identified and once adequate exposure has been obtained, the screws were removed from the PEEK graft and  spacer and the graft was removed.  There was a partially fused fractured inferior endplate and anterior margin of the C2 vertebral body.  This was bitten with a Leksell rongeur to recontour the intervertebral body.  Then some additional drilling of the posterior aspect of C3 was drilled more posteriorly to gain further access to posterior margin of disk space for the new replacement bone graft.  After adequate preparation of this disk space has been achieved, the translational plate was then subsequently removed.  The fusion from C4 to C7 was solid.  Then at point an additional 9-mm PEEK cage packed with local autograft mixed with Actifuse was then inserted using fluoroscopy to confirm depth and position.  Then, a 12-mm globus addition plate was selected and sized up.  Screws were then drilled and placed.  All screws had excellent purchase except for the left screw at C6 which was replaced with a 14-mm after initial 12, 14 mm screws were placed to remainder of the C3-C4 vertebral bodies.  Then fluoroscopy again used at each step along the way to confirm depth and trajectory of screws.  After the plate was adequately placed and all screws had excellent purchase, locking mechanism was engaged.  The wound was copiously irrigated.  Meticulous hemostasis was maintained with Surgifoam  and Gelfoam and copious irrigation.  Drain was then placed and wound was closed in layers with interrupted Vicryl and platysma in a running 4-0 subcuticular.  Dermabond and Steri-Strips were applied.  The patient went to recovery room in stable condition.          ______________________________ Donalee Citrin, M.D.     GC/MEDQ  D:  10/27/2010  T:  10/28/2010  Job:  [id number]  Electronically Signed by Donalee Citrin M.D. on 11/02/2010 03:09:57 PM

## 2010-12-26 ENCOUNTER — Ambulatory Visit
Admission: RE | Admit: 2010-12-26 | Discharge: 2010-12-26 | Disposition: A | Discharge status: Home / Self Care | Payer: BC Managed Care – PPO | Source: Ambulatory Visit | Attending: Neurosurgery | Admitting: Neurosurgery

## 2010-12-26 ENCOUNTER — Other Ambulatory Visit: Payer: Self-pay | Admitting: Neurosurgery

## 2010-12-26 DIAGNOSIS — M542 Cervicalgia: Secondary | ICD-10-CM

## 2011-02-22 ENCOUNTER — Other Ambulatory Visit: Payer: Self-pay | Admitting: Neurosurgery

## 2011-02-22 ENCOUNTER — Ambulatory Visit
Admission: RE | Admit: 2011-02-22 | Discharge: 2011-02-22 | Disposition: A | Discharge status: Home / Self Care | Payer: BC Managed Care – PPO | Source: Ambulatory Visit | Attending: Neurosurgery | Admitting: Neurosurgery

## 2011-02-22 DIAGNOSIS — M542 Cervicalgia: Secondary | ICD-10-CM

## 2011-04-24 ENCOUNTER — Telehealth: Payer: Self-pay | Admitting: Internal Medicine

## 2011-04-24 NOTE — Telephone Encounter (Signed)
Called pt , left message appt is for 05/14/11

## 2011-04-26 ENCOUNTER — Telehealth: Payer: Self-pay | Admitting: *Deleted

## 2011-04-26 NOTE — Telephone Encounter (Signed)
Left pt message to call for appointment °

## 2011-04-27 ENCOUNTER — Telehealth: Payer: Self-pay | Admitting: *Deleted

## 2011-04-27 NOTE — Telephone Encounter (Signed)
Left pt message to call for appointment, per referring they wanted to see Dr. Myna Hidalgo. Bonita Quin has chart to call third time and letter to fax to referring.

## 2011-05-02 ENCOUNTER — Telehealth: Payer: Self-pay | Admitting: Hematology & Oncology

## 2011-05-02 NOTE — Telephone Encounter (Signed)
Pt called to sch New pt apt.  Apt was sch for 06/05/11

## 2011-05-14 ENCOUNTER — Ambulatory Visit: Payer: BC Managed Care – PPO | Admitting: Internal Medicine

## 2011-05-14 ENCOUNTER — Ambulatory Visit: Payer: BC Managed Care – PPO

## 2011-05-14 ENCOUNTER — Other Ambulatory Visit: Payer: BC Managed Care – PPO | Admitting: Lab

## 2011-06-05 ENCOUNTER — Other Ambulatory Visit (HOSPITAL_BASED_OUTPATIENT_CLINIC_OR_DEPARTMENT_OTHER): Payer: BC Managed Care – PPO | Admitting: Lab

## 2011-06-05 ENCOUNTER — Ambulatory Visit (HOSPITAL_BASED_OUTPATIENT_CLINIC_OR_DEPARTMENT_OTHER): Payer: BC Managed Care – PPO | Admitting: Hematology & Oncology

## 2011-06-05 ENCOUNTER — Ambulatory Visit: Payer: BC Managed Care – PPO

## 2011-06-05 ENCOUNTER — Encounter: Payer: Self-pay | Admitting: Hematology & Oncology

## 2011-06-05 DIAGNOSIS — D631 Anemia in chronic kidney disease: Secondary | ICD-10-CM

## 2011-06-05 DIAGNOSIS — N189 Chronic kidney disease, unspecified: Secondary | ICD-10-CM

## 2011-06-05 DIAGNOSIS — D649 Anemia, unspecified: Secondary | ICD-10-CM

## 2011-06-05 DIAGNOSIS — D509 Iron deficiency anemia, unspecified: Secondary | ICD-10-CM

## 2011-06-05 DIAGNOSIS — E119 Type 2 diabetes mellitus without complications: Secondary | ICD-10-CM

## 2011-06-05 DIAGNOSIS — M51379 Other intervertebral disc degeneration, lumbosacral region without mention of lumbar back pain or lower extremity pain: Secondary | ICD-10-CM

## 2011-06-05 DIAGNOSIS — I10 Essential (primary) hypertension: Secondary | ICD-10-CM

## 2011-06-05 DIAGNOSIS — L409 Psoriasis, unspecified: Secondary | ICD-10-CM | POA: Insufficient documentation

## 2011-06-05 DIAGNOSIS — M5030 Other cervical disc degeneration, unspecified cervical region: Secondary | ICD-10-CM | POA: Insufficient documentation

## 2011-06-05 DIAGNOSIS — M5137 Other intervertebral disc degeneration, lumbosacral region: Secondary | ICD-10-CM

## 2011-06-05 HISTORY — DX: Anemia in chronic kidney disease: D63.1

## 2011-06-05 HISTORY — DX: Anemia in chronic kidney disease: N18.9

## 2011-06-05 HISTORY — DX: Other intervertebral disc degeneration, lumbosacral region: M51.37

## 2011-06-05 HISTORY — DX: Other cervical disc degeneration, unspecified cervical region: M50.30

## 2011-06-05 HISTORY — DX: Other intervertebral disc degeneration, lumbosacral region without mention of lumbar back pain or lower extremity pain: M51.379

## 2011-06-05 HISTORY — DX: Essential (primary) hypertension: I10

## 2011-06-05 HISTORY — DX: Psoriasis, unspecified: L40.9

## 2011-06-05 LAB — CBC WITH DIFFERENTIAL (CANCER CENTER ONLY)
BASO#: 0 10*3/uL (ref 0.0–0.2)
EOS%: 0 % (ref 0.0–7.0)
HCT: 29.9 % — ABNORMAL LOW (ref 34.8–46.6)
HGB: 10 g/dL — ABNORMAL LOW (ref 11.6–15.9)
MCH: 30.8 pg (ref 26.0–34.0)
MCHC: 33.4 g/dL (ref 32.0–36.0)
MONO%: 6.4 % (ref 0.0–13.0)
NEUT#: 9.6 10*3/uL — ABNORMAL HIGH (ref 1.5–6.5)
NEUT%: 82.4 % — ABNORMAL HIGH (ref 39.6–80.0)

## 2011-06-05 LAB — TECHNOLOGIST REVIEW CHCC SATELLITE

## 2011-06-05 NOTE — Progress Notes (Signed)
CC:   [name], M.D. [name], M.D.  DIAGNOSES: 1. Anemia, likely multifactorial. 2. Non-insulin-dependent diabetes. 3. Psoriasis. 4. Hypertension. 5. Hyperlipidemia. 6. Situational depression.  HISTORY OF PRESENT ILLNESS:  [name] is a very charming 61 year old white female.  She certainly looks younger than 61 years old.  She has multiple medical issues.  She has noninsulin-dependent diabetes.  She has had this for about 4 years now.  She also has psoriasis.  She sees a dermatologist.  The dermatologist wanted to put her on methotrexate. However, she does not wish to take methotrexate after seeing the side effects that she may have.  She sees Dr. Lupita Padilla for her primary care needs.  [name] wanted to make sure that the anemia did not need further evaluation if she was to go on methotrexate.  [name] has known she has known that she has had anemia.  However, she has not had any problems with this.  She has had no bleeding.  Her last colonoscopy was a couple of years ago.  She has had multiple surgeries.  She has had gallbladder out.  She has had a hysterectomy.  She has had multiple neck and lower back surgeries.  She is scheduled to I think a left rotator cuff surgery next week by [name].  Her lab work that has been done by [name] I think back in December that showed a white cell count of 6.9, hemoglobin 10.5, hematocrit 31.2, platelet count was 230.  MCV was 92.  She had a relatively normal white cell differential.  [name] did say she did have a steroid injection I think into her right ankle yesterday.  She has had electrolytes done.  Electrolytes have shown the elevated blood sugars.  Her hemoglobin A1c was 7.7 I think back in December.  BUN and creatinine were 20 and 0.99.  She had normal LFTs.  She did have a B12 level of 582 back in December.  Her folic acid was greater than 20.  Her ferritin was only 34, which in my  opinion is low considering her other inflammatory conditions.  She does not have any "cravings."  She may chew ice a little bit more than she would like.  She is not a vegetarian.  She has not had any cough.  She does have occasional fatigue.  Again, [name] was very kind and referred her to the [facility] for an evaluation.  PAST MEDICAL HISTORY: 1. Noninsulin-dependent diabetes. 2. Hyperlipidemia. 3. Hypertension. 4. Asthma. 5. GERD. 6. Depression/anxiety. 7. Psoriasis. 8. Neck and spinal degeneration. 9. Cholecystectomy. 10.Hysterectomy.  ALLERGIES: 1. IV dye. 2. Every known narcotic. 3. Sulfa antibiotics. 4. Valium. 5. Wellbutrin.  CURRENT MEDICATIONS: 1. Amaryl 4 mg p.o. daily. 2. Zestoretic 20/25 one p.o. daily. 3. Ativan 1 mg p.o. b.i.d. p.r.n. 4. Metformin 1000 mg p.o. b.i.d. 5. Protonix 40 mg p.o. daily. 6. Requip 2 mg p.o. q.h.s. 7. Zocor 40 mg p.o. daily. 8. Januvia 100 mg p.o. daily. 9. Ultram 50 mg p.o. q.4 hours p.r.n. 10.Effexor XR 150 mg p.o. b.i.d. 11.Ambien 10 mg p.o. q.h.s.  SOCIAL HISTORY:  Negative for tobacco use.  There is no alcohol use. She has no obvious occupational exposures.  FAMILY HISTORY:  Relatively noncontributory.  REVIEW OF SYSTEMS:  As stated in history of present illness.  No additional findings are noted on a 12-system review.  PHYSICAL EXAMINATION:  This is a mildly obese white female in no obvious distress.  Vital signs:  Temperature 97, pulse 88, respiratory rate 18, blood pressure 150/71.  Weight is 218.  Head and neck:  Shows a normocephalic, atraumatic skull.  There are no ocular or oral lesions. There are no palpable cervical or supraclavicular lymph nodes.  Lungs: Clear to percussion and auscultation bilaterally.  Cardiac:  Regular rate and rhythm with a normal S1 and S2.  There are no murmurs, rubs or bruits.  Abdomen:  Soft with good bowel sounds.  There is no palpable abdominal mass.   There is no palpable hepatosplenomegaly.  Back:  No tenderness over the spine, ribs, or hips.  Extremities:  No clubbing, cyanosis or edema.  Neurologic:  No focal neurological deficits.  Skin: Shows the areas of plaque from psoriasis on her knees.  She does have some diabetic skin changes in her lower legs.  LABORATORY STUDIES:  White count is 11.6, hemoglobin 10, hematocrit 30, platelet count is 271.  MCV is 92.  Peripheral smear shows a normochromic, normocytic population of red blood cells.  There is maybe some slight hypochromic tendency.  There is no polychromasia.  She has no nucleated red blood cells.  There are no teardrop cells.  There is rouleaux formation.  She has no target cells. White cells appear normal in morphology and maturation.  There are no immature myeloid or lymphoid forms.  There are no hypersegmented polys. There may be a slight increase in neutrophils because of the steroid injection yesterday.  Platelets are adequate number and size.  Platelets are well-granulated.  IMPRESSION:  [name] is a very charming 61 year old white female with anemia.  I would have to believe is going to be multifactorial.  I suspect that she likely is iron-deficient.  Her ferritin of 34, in my mind, is low.  I will see what her ferritin and iron studies are today.  I also suspect that she likely has erythropoietin deficiency secondary to her diabetes.  I do not see anything that would suggest a pernicious anemia or a megaloblastic process.  She is not on methotrexate, so this would not be an issue.  She is on numerous medications.  She may have anemia secondary to polypharmacy.  I do not see any indication for a myelodysplasia.  I do not see any evidence to support a hemolytic anemia.  I do not see that we need to do a bone marrow biopsy on her.  I suspect that we will get the etiology for her anemia with our blood tests.  Once we get the results of her blood tests  back, I will be in touch with [name].  I suspect that she applied a combination of iron and ESA order to improve her anemia.  I do not see any contraindication to her having her rotator cuff surgery next week.  This should not result in a significant blood loss.  I will, again, call Ms. Aaron when I get the results back from the lab work.  Ms. Hetzer is awful nice.  I really did enjoy talking with her.  I did give her a prayer blanket, which she appreciated and will bring to her surgery next week.  I spent a good hour and a half with Ms. Odonoghue.    ______________________________ Josph Macho, M.D. PRE/MEDQ  D:  06/05/2011  T:  06/05/2011  Job:  [id number]  ADDENDUM:  M-Spike is (-).  Ferritin is 17.  %Sat is 12. EPO is 15.

## 2011-06-05 NOTE — Progress Notes (Signed)
This office note has been dictated.

## 2011-06-07 LAB — PROTEIN ELECTROPHORESIS, SERUM, WITH REFLEX
Albumin ELP: 60.1 % (ref 55.8–66.1)
Alpha-1-Globulin: 4.9 % (ref 2.9–4.9)
Alpha-2-Globulin: 11.5 % (ref 7.1–11.8)
Total Protein, Serum Electrophoresis: 6.5 g/dL (ref 6.0–8.3)

## 2011-06-07 LAB — RETICULOCYTES (CHCC)
ABS Retic: 113.2 10*3/uL (ref 19.0–186.0)
Retic Ct Pct: 3.4 % — ABNORMAL HIGH (ref 0.4–2.3)

## 2011-06-07 LAB — IRON AND TIBC: %SAT: 12 % — ABNORMAL LOW (ref 20–55)

## 2011-06-08 ENCOUNTER — Other Ambulatory Visit: Payer: Self-pay | Admitting: Hematology & Oncology

## 2011-06-08 ENCOUNTER — Telehealth: Payer: Self-pay | Admitting: Hematology & Oncology

## 2011-06-08 ENCOUNTER — Other Ambulatory Visit: Payer: Self-pay

## 2011-06-08 NOTE — Telephone Encounter (Signed)
Pt aware of 06-11-11 iron infusion. Ari aware to pre cert

## 2011-06-11 ENCOUNTER — Ambulatory Visit (HOSPITAL_BASED_OUTPATIENT_CLINIC_OR_DEPARTMENT_OTHER): Payer: BC Managed Care – PPO

## 2011-06-11 VITALS — BP 111/65 | HR 79 | Temp 98.7°F

## 2011-06-11 DIAGNOSIS — D509 Iron deficiency anemia, unspecified: Secondary | ICD-10-CM

## 2011-06-11 MED ORDER — SODIUM CHLORIDE 0.9 % IV SOLN
1020.0000 mg | Freq: Once | INTRAVENOUS | Status: AC
  Administered 2011-06-11: 1020 mg via INTRAVENOUS
  Filled 2011-06-11: qty 34

## 2011-06-11 MED ORDER — SODIUM CHLORIDE 0.9 % IJ SOLN
3.0000 mL | Freq: Once | INTRAMUSCULAR | Status: AC | PRN
  Administered 2011-06-11: 3 mL via INTRAVENOUS
  Filled 2011-06-11: qty 10

## 2011-08-30 ENCOUNTER — Telehealth: Payer: Self-pay | Admitting: Hematology & Oncology

## 2011-08-30 NOTE — Telephone Encounter (Signed)
Pt called said Dr. Clelia Croft wanted her to see Dr. Myna Hidalgo. Per MD can see anytime in May. Pt made 5-28 appointment

## 2011-10-02 ENCOUNTER — Ambulatory Visit: Payer: BC Managed Care – PPO | Admitting: Dietician

## 2011-10-02 ENCOUNTER — Ambulatory Visit (HOSPITAL_BASED_OUTPATIENT_CLINIC_OR_DEPARTMENT_OTHER): Payer: BC Managed Care – PPO | Admitting: Hematology & Oncology

## 2011-10-02 ENCOUNTER — Ambulatory Visit (HOSPITAL_BASED_OUTPATIENT_CLINIC_OR_DEPARTMENT_OTHER): Payer: BC Managed Care – PPO

## 2011-10-02 ENCOUNTER — Other Ambulatory Visit (HOSPITAL_BASED_OUTPATIENT_CLINIC_OR_DEPARTMENT_OTHER): Payer: BC Managed Care – PPO | Admitting: Lab

## 2011-10-02 VITALS — BP 140/67 | HR 86 | Temp 97.8°F | Ht 63.0 in | Wt 222.0 lb

## 2011-10-02 DIAGNOSIS — E119 Type 2 diabetes mellitus without complications: Secondary | ICD-10-CM

## 2011-10-02 DIAGNOSIS — N289 Disorder of kidney and ureter, unspecified: Secondary | ICD-10-CM

## 2011-10-02 DIAGNOSIS — D649 Anemia, unspecified: Secondary | ICD-10-CM

## 2011-10-02 DIAGNOSIS — D509 Iron deficiency anemia, unspecified: Secondary | ICD-10-CM

## 2011-10-02 DIAGNOSIS — D631 Anemia in chronic kidney disease: Secondary | ICD-10-CM

## 2011-10-02 DIAGNOSIS — N189 Chronic kidney disease, unspecified: Secondary | ICD-10-CM

## 2011-10-02 LAB — CBC WITH DIFFERENTIAL (CANCER CENTER ONLY)
BASO%: 0.1 % (ref 0.0–2.0)
LYMPH#: 1.5 10*3/uL (ref 0.9–3.3)
LYMPH%: 22.3 % (ref 14.0–48.0)
MONO#: 0.5 10*3/uL (ref 0.1–0.9)
Platelets: 210 10*3/uL (ref 145–400)
RDW: 12.8 % (ref 11.1–15.7)
WBC: 6.7 10*3/uL (ref 3.9–10.0)

## 2011-10-02 LAB — FERRITIN: Ferritin: 150 ng/mL (ref 10–291)

## 2011-10-02 LAB — IRON AND TIBC
TIBC: 313 ug/dL (ref 250–470)
UIBC: 180 ug/dL (ref 125–400)

## 2011-10-02 MED ORDER — DARBEPOETIN ALFA-POLYSORBATE 300 MCG/0.6ML IJ SOLN
300.0000 ug | Freq: Once | INTRAMUSCULAR | Status: AC
  Administered 2011-10-02: 300 ug via SUBCUTANEOUS

## 2011-10-02 NOTE — Progress Notes (Signed)
This office note has been dictated.

## 2011-10-02 NOTE — Patient Instructions (Signed)
Darbepoetin Alfa injection What is this medicine? DARBEPOETIN ALFA (dar be POE e tin AL fa) helps your body make more red blood cells. It is used to treat anemia caused by chronic kidney failure and chemotherapy. This medicine may be used for other purposes; ask your health care provider or pharmacist if you have questions. What should I tell my health care provider before I take this medicine? They need to know if you have any of these conditions: -blood clotting disorders or history of blood clots -cancer patient not on chemotherapy -cystic fibrosis -heart disease, such as angina, heart failure, or a history of a heart attack -hemoglobin level of 12 g/dL or greater -high blood pressure -low levels of folate, iron, or vitamin B12 -seizures -an unusual or allergic reaction to darbepoetin, erythropoietin, albumin, hamster proteins, latex, other medicines, foods, dyes, or preservatives -pregnant or trying to get pregnant -breast-feeding How should I use this medicine? This medicine is for injection into a vein or under the skin. It is usually given by a health care professional in a hospital or clinic setting. If you get this medicine at home, you will be taught how to prepare and give this medicine. Do not shake the solution before you withdraw a dose. Use exactly as directed. Take your medicine at regular intervals. Do not take your medicine more often than directed. It is important that you put your used needles and syringes in a special sharps container. Do not put them in a trash can. If you do not have a sharps container, call your pharmacist or healthcare provider to get one. Talk to your pediatrician regarding the use of this medicine in children. While this medicine may be used in children as young as 1 year for selected conditions, precautions do apply. Overdosage: If you think you have taken too much of this medicine contact a poison control center or emergency room at once. NOTE:  This medicine is only for you. Do not share this medicine with others. What if I miss a dose? If you miss a dose, take it as soon as you can. If it is almost time for your next dose, take only that dose. Do not take double or extra doses. What may interact with this medicine? Do not take this medicine with any of the following medications: -epoetin alfa This list may not describe all possible interactions. Give your health care provider a list of all the medicines, herbs, non-prescription drugs, or dietary supplements you use. Also tell them if you smoke, drink alcohol, or use illegal drugs. Some items may interact with your medicine. What should I watch for while using this medicine? Visit your prescriber or health care professional for regular checks on your progress and for the needed blood tests and blood pressure measurements. It is especially important for the doctor to make sure your hemoglobin level is in the desired range, to limit the risk of potential side effects and to give you the best benefit. Keep all appointments for any recommended tests. Check your blood pressure as directed. Ask your doctor what your blood pressure should be and when you should contact him or her. As your body makes more red blood cells, you may need to take iron, folic acid, or vitamin B supplements. Ask your doctor or health care provider which products are right for you. If you have kidney disease continue dietary restrictions, even though this medication can make you feel better. Talk with your doctor or health care professional about the   foods you eat and the vitamins that you take. What side effects may I notice from receiving this medicine? Side effects that you should report to your doctor or health care professional as soon as possible: -allergic reactions like skin rash, itching or hives, swelling of the face, lips, or tongue -breathing problems -changes in vision -chest pain -confusion, trouble speaking  or understanding -feeling faint or lightheaded, falls -high blood pressure -muscle aches or pains -pain, swelling, warmth in the leg -rapid weight gain -severe headaches -sudden numbness or weakness of the face, arm or leg -trouble walking, dizziness, loss of balance or coordination -seizures (convulsions) -swelling of the ankles, feet, hands -unusually weak or tired Side effects that usually do not require medical attention (report to your doctor or health care professional if they continue or are bothersome): -diarrhea -fever, chills (flu-like symptoms) -headaches -nausea, vomiting -redness, stinging, or swelling at site where injected This list may not describe all possible side effects. Call your doctor for medical advice about side effects. You may report side effects to FDA at 1-800-FDA-1088. Where should I keep my medicine? Keep out of the reach of children. Store in a refrigerator between 2 and 8 degrees C (36 and 46 degrees F). Do not freeze. Do not shake. Throw away any unused portion if using a single-dose vial. Throw away any unused medicine after the expiration date. NOTE: This sheet is a summary. It may not cover all possible information. If you have questions about this medicine, talk to your doctor, pharmacist, or health care provider.  2012, [employer]vier/Gold Standard. (04/06/2008 10:23:57 AM) 

## 2011-10-02 NOTE — Progress Notes (Signed)
CC:   [name], M.D.  DIAGNOSES: 1. Anemia of renal insufficiency. 2. Anemia secondary to iron deficiency anemia. 3. Diabetes, likely to be started on insulin.  CURRENT THERAPY: 1. Patient status post IV iron in February with Feraheme 1020 mg. 2. Patient to start Aranesp 300 mcg subcu q.4 weeks for hemoglobin     less than 11.  INTERIM HISTORY:  [name] comes in for her followup.  When we first saw her back in January, she was clearly iron deficient.  We found that her ferritin was 17.  Iron saturation was only 12%.  Her erythropoietin level was only 15.  We did go ahead and give her a dose of IV iron.  She got a dose of Feraheme 1020 mg in February.  The patient still says she feels tired. I think this is more related to all her other health issues.  Her blood sugars are still about 200.  She probably will be started on insulin soon.  The patient has an appointment with the diabetes center.  I do believe that Aranesp is going to be needed to try to get her blood count up better.  Again, hopefully this will make her feel better.  I think that if her blood sugars are still high, she is going to have problems.  She has had no obvious bleeding.  There has been no change in bowel or bladder habits.  She is not having any issues with her psoriasis "breaking out."  PHYSICAL EXAM:  General:  This is a well-developed, well-nourished white female in no obvious distress.  Vital signs:  Temperature of 97.8, pulse 86, respiratory rate 22, blood pressure 140/67, weight is 222.  Head and neck:  Normocephalic, atraumatic skull.  There are no ocular or oral lesions.  There are no palpable cervical, supraclavicular lymph nodes. Lungs:  Clear to percussion and auscultation bilaterally.  Cardiac: Regular rate and rhythm with a normal S1 and S2.  There are no murmurs, rubs, or bruits.  Abdomen:  Soft with good bowel sounds.  There is no palpable abdominal mass.  She has laparotomy  scars.  There is no palpable hepatosplenomegaly.  Back:  Shows laminectomy scar in her lumbar spine.  No tenderness is noted over the spine, ribs, or hips. Extremities:  Show no clubbing, cyanosis or edema.  She has good range of motion of her joints.  Skin:  No rashes, ecchymoses or petechiae.  LABORATORY STUDIES:  White cell count 6.7, hemoglobin 10.8, hematocrit 32, platelet count 210.  MCV is 96.  IMPRESSION:  [name] is a 61 year old white female with multiple medical problems.  She clearly has anemia secondary to renal insufficiency.  Her erythropoietin level is low.  Hopefully, Aranesp will help.  We will give her a dose of 300 mcg today.  We will then plan to get her back in 1 month.  We will see if she is feeling better.  I would have to expect her hemoglobin to be a little above 11.  I suspect that when we see her back, she should be on insulin.    ______________________________ Josph Macho, M.D. PRE/MEDQ  D:  10/02/2011  T:  10/02/2011  Job:  [id number]

## 2011-10-04 ENCOUNTER — Telehealth: Payer: Self-pay | Admitting: *Deleted

## 2011-10-04 NOTE — Telephone Encounter (Addendum)
Message copied by Mirian Capuchin on Thu Oct 04, 2011 11:13 AM ------      Message from: Arlan Organ R      Created: Wed Oct 03, 2011 10:04 PM       Call - iron is much better!!!  Cindee Lame This message mailed to pt.

## 2011-10-30 ENCOUNTER — Ambulatory Visit (HOSPITAL_BASED_OUTPATIENT_CLINIC_OR_DEPARTMENT_OTHER): Payer: BC Managed Care – PPO | Admitting: Hematology & Oncology

## 2011-10-30 ENCOUNTER — Ambulatory Visit: Payer: BC Managed Care – PPO

## 2011-10-30 ENCOUNTER — Other Ambulatory Visit (HOSPITAL_BASED_OUTPATIENT_CLINIC_OR_DEPARTMENT_OTHER): Payer: BC Managed Care – PPO | Admitting: Lab

## 2011-10-30 VITALS — BP 136/65 | HR 71 | Temp 97.4°F | Ht 63.0 in | Wt 225.0 lb

## 2011-10-30 DIAGNOSIS — N189 Chronic kidney disease, unspecified: Secondary | ICD-10-CM

## 2011-10-30 DIAGNOSIS — E119 Type 2 diabetes mellitus without complications: Secondary | ICD-10-CM

## 2011-10-30 DIAGNOSIS — D631 Anemia in chronic kidney disease: Secondary | ICD-10-CM

## 2011-10-30 DIAGNOSIS — D510 Vitamin B12 deficiency anemia due to intrinsic factor deficiency: Secondary | ICD-10-CM

## 2011-10-30 DIAGNOSIS — D509 Iron deficiency anemia, unspecified: Secondary | ICD-10-CM

## 2011-10-30 DIAGNOSIS — N289 Disorder of kidney and ureter, unspecified: Secondary | ICD-10-CM

## 2011-10-30 DIAGNOSIS — D649 Anemia, unspecified: Secondary | ICD-10-CM

## 2011-10-30 LAB — CBC WITH DIFFERENTIAL (CANCER CENTER ONLY)
BASO%: 0.1 % (ref 0.0–2.0)
EOS%: 0.1 % (ref 0.0–7.0)
Eosinophils Absolute: 0 10*3/uL (ref 0.0–0.5)
LYMPH%: 22.8 % (ref 14.0–48.0)
MCH: 32.9 pg (ref 26.0–34.0)
MCHC: 34 g/dL (ref 32.0–36.0)
MCV: 97 fL (ref 81–101)
MONO%: 7.5 % (ref 0.0–13.0)
NEUT#: 5.1 10*3/uL (ref 1.5–6.5)
Platelets: 251 10*3/uL (ref 145–400)
RDW: 12.6 % (ref 11.1–15.7)

## 2011-10-30 LAB — RETICULOCYTES (CHCC)
ABS Retic: 64.4 10*3/uL (ref 19.0–186.0)
RBC.: 3.22 MIL/uL — ABNORMAL LOW (ref 3.87–5.11)
Retic Ct Pct: 2 % (ref 0.4–2.3)

## 2011-10-30 LAB — FERRITIN: Ferritin: 115 ng/mL (ref 10–291)

## 2011-10-30 LAB — IRON AND TIBC: Iron: 67 ug/dL (ref 42–145)

## 2011-10-30 NOTE — Progress Notes (Signed)
This office note has been dictated.

## 2011-10-31 NOTE — Progress Notes (Signed)
CC:   [name], M.D.  DIAGNOSES: 1. Anemia secondary to renal insufficiency. 2. Iron deficiency anemia, intermittent. 3. Diabetes, marginal control.  CURRENT THERAPY: 1. IV iron as indicated. 2. Patient is status post 1 dose of Aranesp, 300 mcg.  INTERIM HISTORY:  [name] comes in for followup.  We did go ahead and give [name] a dose of Aranesp last month.  She only has an erythropoietin level of 15.  Her diabetes is not that well controlled.  She is not yet on insulin. She is going to try to exercise a little bit more.  Her family does not want her to really take any Aranesp.  They got on the Internet and saw all the bad things that could happen.  As such, we will just hold on Aranesp for right now.  I can understand [name]' hesitation.  I certainly respect this.  She still feels tired.  This I think is going to be a chronic problem for her as long as her blood sugars are not well controlled.  She has had no obvious bleeding.  There has been no change in bowel or bladder habits.  When we last saw her, her ferritin was 150 with an iron saturation of 42%.  PHYSICAL EXAMINATION:  General:  This is a somewhat obese white female in no obvious distress.  Vital Signs:  Temperature 97.4, pulse 71, respiratory rate 18, blood pressure 136/65, weight is 225.  Head and Neck Exam:  Shows a normocephalic, atraumatic skull.  There are no ocular or oral lesions.  There are no palpable cervical or supraclavicular lymph nodes.  Lungs:  Clear bilaterally.  Cardiac Exam: Regular rate and rhythm with a normal S1 and S2.  There are no murmurs, rubs, or bruits.  Abdominal Exam:  Soft with good bowel sounds.  There is no palpable abdominal mass.  There is no palpable hepatosplenomegaly. Back Exam:  No tenderness over the spine, ribs, or hips.  Extremities: Show no clubbing, cyanosis, or edema.  LABORATORY STUDIES:  White cell count is 7.4, hemoglobin 10.4, hematocrit 30.6,  platelet count is 251.  IMPRESSION:  [name] is a very nice 61 year old white female with multifactorial anemia that mostly is anemia of renal insufficiency. She, again, is reluctant to take the Aranesp.  As such, we will just follow for right now.  We will go ahead and plan to get her back in about 2 months' time now.  We are checking her iron studies today.  It is possible that she may need be on the low side with her iron.  If so, we can certainly give her another dose of Feraheme.    ______________________________ Josph Macho, M.D. PRE/MEDQ  D:  10/30/2011  T:  10/30/2011  Job:  [id number]

## 2011-11-02 ENCOUNTER — Telehealth: Payer: Self-pay | Admitting: *Deleted

## 2011-11-02 ENCOUNTER — Other Ambulatory Visit: Payer: Self-pay | Admitting: *Deleted

## 2011-11-02 DIAGNOSIS — D509 Iron deficiency anemia, unspecified: Secondary | ICD-10-CM

## 2011-11-02 NOTE — Telephone Encounter (Signed)
Message copied by Anselm Jungling on Fri Nov 02, 2011 10:59 AM ------      Message from: Arlan Organ R      Created: Thu Nov 01, 2011 12:46 PM       Please call her and tell her that her iron is dropping again. We need to give her another dose of FeraHeme at 1020 mg. Please set this up in one or 2 weeks. Cindee Lame

## 2011-11-02 NOTE — Telephone Encounter (Signed)
Called patient to let [name] know that [name] iron was dropping again per dr. Myna Hidalgo. Made appt with patient to come in on Tuesday at 10a.

## 2011-11-06 ENCOUNTER — Ambulatory Visit (HOSPITAL_BASED_OUTPATIENT_CLINIC_OR_DEPARTMENT_OTHER): Payer: BC Managed Care – PPO

## 2011-11-06 VITALS — BP 145/72 | HR 84 | Temp 98.3°F

## 2011-11-06 DIAGNOSIS — D509 Iron deficiency anemia, unspecified: Secondary | ICD-10-CM

## 2011-11-06 MED ORDER — SODIUM CHLORIDE 0.9 % IV SOLN
INTRAVENOUS | Status: DC
  Administered 2011-11-06: 11:00:00 via INTRAVENOUS

## 2011-11-06 MED ORDER — SODIUM CHLORIDE 0.9 % IV SOLN
1020.0000 mg | Freq: Once | INTRAVENOUS | Status: AC
  Administered 2011-11-06: 1020 mg via INTRAVENOUS
  Filled 2011-11-06: qty 34

## 2011-11-06 NOTE — Patient Instructions (Signed)
Ferumoxytol injection What is this medicine? FERUMOXYTOL is an iron complex. Iron is used to make healthy red blood cells, which carry oxygen and nutrients throughout the body. This medicine is used to treat iron deficiency anemia in people with chronic kidney disease. This medicine may be used for other purposes; ask your health care provider or pharmacist if you have questions. What should I tell my health care provider before I take this medicine? They need to know if you have any of these conditions: -anemia not caused by low iron levels -high levels of iron in the blood -magnetic resonance imaging (MRI) test scheduled -an unusual or allergic reaction to iron, other medicines, foods, dyes, or preservatives -pregnant or trying to get pregnant -breast-feeding How should I use this medicine? This medicine is for infusion into a vein. It is given by a health care professional in a hospital or clinic setting. Talk to your pediatrician regarding the use of this medicine in children. Special care may be needed. Overdosage: If you think you've taken too much of this medicine contact a poison control center or emergency room at once. Overdosage: If you think you have taken too much of this medicine contact a poison control center or emergency room at once. NOTE: This medicine is only for you. Do not share this medicine with others. What if I miss a dose? It is important not to miss your dose. Call your doctor or health care professional if you are unable to keep an appointment. What may interact with this medicine? This medicine may interact with the following medications: -other iron products This list may not describe all possible interactions. Give your health care provider a list of all the medicines, herbs, non-prescription drugs, or dietary supplements you use. Also tell them if you smoke, drink alcohol, or use illegal drugs. Some items may interact with your medicine. What should I watch  for while using this medicine? Visit your doctor or healthcare professional regularly. Tell your doctor or healthcare professional if your symptoms do not start to get better or if they get worse. You may need blood work done while you are taking this medicine. You may need to follow a special diet. Talk to your doctor. Foods that contain iron include: whole grains/cereals, dried fruits, beans, or peas, leafy green vegetables, and organ meats (liver, kidney). What side effects may I notice from receiving this medicine? Side effects that you should report to your doctor or health care professional as soon as possible: -allergic reactions like skin rash, itching or hives, swelling of the face, lips, or tongue -breathing problems -changes in blood pressure -feeling faint or lightheaded, falls -fever or chills -flushing, sweating, or hot feelings -swelling of the ankles or feet Side effects that usually do not require medical attention (Report these to your doctor or health care professional if they continue or are bothersome.): -diarrhea -headache -nausea, vomiting -stomach pain This list may not describe all possible side effects. Call your doctor for medical advice about side effects. You may report side effects to FDA at 1-800-FDA-1088. Where should I keep my medicine? This drug is given in a hospital or clinic and will not be stored at home. NOTE: This sheet is a summary. It may not cover all possible information. If you have questions about this medicine, talk to your doctor, pharmacist, or health care provider.  2012, [employer]. (01/14/2008 9:48:25 PM) 

## 2011-11-25 ENCOUNTER — Emergency Department (HOSPITAL_BASED_OUTPATIENT_CLINIC_OR_DEPARTMENT_OTHER)
Admission: EM | Admit: 2011-11-25 | Discharge: 2011-11-26 | Disposition: A | Discharge status: Home / Self Care | Payer: BC Managed Care – PPO | Attending: Emergency Medicine | Admitting: Emergency Medicine

## 2011-11-25 ENCOUNTER — Encounter (HOSPITAL_BASED_OUTPATIENT_CLINIC_OR_DEPARTMENT_OTHER): Payer: Self-pay | Admitting: *Deleted

## 2011-11-25 DIAGNOSIS — E119 Type 2 diabetes mellitus without complications: Secondary | ICD-10-CM | POA: Insufficient documentation

## 2011-11-25 DIAGNOSIS — L089 Local infection of the skin and subcutaneous tissue, unspecified: Secondary | ICD-10-CM | POA: Insufficient documentation

## 2011-11-25 DIAGNOSIS — L0390 Cellulitis, unspecified: Secondary | ICD-10-CM

## 2011-11-25 DIAGNOSIS — I10 Essential (primary) hypertension: Secondary | ICD-10-CM | POA: Insufficient documentation

## 2011-11-25 DIAGNOSIS — R739 Hyperglycemia, unspecified: Secondary | ICD-10-CM

## 2011-11-25 LAB — BASIC METABOLIC PANEL
CO2: 24 mEq/L (ref 19–32)
Calcium: 9.5 mg/dL (ref 8.4–10.5)
Chloride: 96 mEq/L (ref 96–112)
Potassium: 4.1 mEq/L (ref 3.5–5.1)
Sodium: 134 mEq/L — ABNORMAL LOW (ref 135–145)

## 2011-11-25 LAB — CBC WITH DIFFERENTIAL/PLATELET
Band Neutrophils: 0 % (ref 0–10)
Blasts: 0 %
HCT: 28.1 % — ABNORMAL LOW (ref 36.0–46.0)
MCHC: 34.5 g/dL (ref 30.0–36.0)
MCV: 94.6 fL (ref 78.0–100.0)
Metamyelocytes Relative: 0 %
Monocytes Absolute: 0.6 10*3/uL (ref 0.1–1.0)
Myelocytes: 0 %
Platelets: 195 10*3/uL (ref 150–400)
Promyelocytes Absolute: 0 %
RDW: 12.8 % (ref 11.5–15.5)

## 2011-11-25 LAB — GLUCOSE, CAPILLARY: Glucose-Capillary: 328 mg/dL — ABNORMAL HIGH (ref 70–99)

## 2011-11-25 MED ORDER — SODIUM CHLORIDE 0.9 % IV BOLUS (SEPSIS)
1000.0000 mL | Freq: Once | INTRAVENOUS | Status: AC
  Administered 2011-11-25: 1000 mL via INTRAVENOUS

## 2011-11-25 MED ORDER — CLINDAMYCIN PHOSPHATE 600 MG/50ML IV SOLN
600.0000 mg | Freq: Once | INTRAVENOUS | Status: AC
  Administered 2011-11-25: 600 mg via INTRAVENOUS
  Filled 2011-11-25: qty 50

## 2011-11-25 NOTE — ED Provider Notes (Addendum)
History   This chart was scribed for Sherri B. Bernette Mayers, MD by Melba Coon. The patient was seen in room MH04/MH04 and the patient's care was started at 3:22PM.    CSN: [id number]  Arrival date & time 11/25/11  1453   First MD Initiated Contact with Patient 11/25/11 1515      Chief Complaint  Patient presents with  . Wound Infection    (Consider location/radiation/quality/duration/timing/severity/associated sxs/prior treatment) HPI IOANA LOUKS is a 61 y.o. female who presents to the Emergency Department complaining of constant, moderate to severe right 2nd toe swelling onset 3 days ago. No associated pain.  No trauma to the affected area. Epson salt and peroxide do not alleviate the s/s. Fever of 101 and chills present. Sugars have been elevated. Increased urinary frequency present. No HA, neck pain, sore throat, rash, back pain, CP, SOB, abd pain, n/v/d, dysuria, or extremity weakness or tingling. Hx of diabetes. Allergic to Ultram; Betadine; Contrast media; Fentanyl and related; Hydrocodone; Morphine and related; Percocet; Sulfa antibiotics; Valium; Vicodin; Darvocet; and Wellbutrin. No other pertinent medical symptoms.  Past Medical History  Diagnosis Date  . Anemia in chronic renal disease 06/05/2011  . Psoriasis 06/05/2011  . DDD (degenerative disc disease), cervical 06/05/2011  . DDD (degenerative disc disease), lumbosacral 06/05/2011  . HTN (hypertension) 06/05/2011  . Diabetes mellitus     Past Surgical History  Procedure Date  . Back surgery   . Cholecystectomy   . Elbow surgery   . Abdominal hysterectomy     History reviewed. No pertinent family history.  History  Substance Use Topics  . Smoking status: Never Smoker   . Smokeless tobacco: Not on file  . Alcohol Use: No    OB History    Grav Para Term Preterm Abortions TAB SAB Ect Mult Living                  Review of Systems 10 Systems reviewed and all are negative for acute change except as noted in  the HPI.   Allergies  Ultram; Betadine; Contrast media; Fentanyl and related; Hydrocodone; Morphine and related; Percocet; Sulfa antibiotics; Valium; Vicodin; Darvocet; and Wellbutrin  Home Medications   Current Outpatient Rx  Name Route Sig Dispense Refill  . ALBUTEROL SULFATE HFA 108 (90 BASE) MCG/ACT IN AERS Inhalation Inhale 2 puffs into the lungs every 6 (six) hours as needed. For shortness of breath    . CALCIUM CARBONATE-VITAMIN D 500-200 MG-UNIT PO TABS Oral Take 1 tablet by mouth daily.    . CHROMIUM PICOLINATE PO Oral Take 1 tablet by mouth daily.    Marland Kitchen GLIMEPIRIDE 4 MG PO TABS Oral Take 8 mg by mouth at bedtime.     Marland Kitchen HYDROCORTISONE 1 % EX CREA Topical Apply 1 application topically daily. For Psoriasis    . LISINOPRIL-HYDROCHLOROTHIAZIDE 20-25 MG PO TABS Oral Take 1 tablet by mouth daily.    Marland Kitchen LORAZEPAM 1 MG PO TABS Oral Take 1 mg by mouth 2 (two) times daily before lunch and supper.    Marland Kitchen METFORMIN HCL 1000 MG PO TABS Oral Take 1,000 mg by mouth 2 (two) times daily with a meal.    . PANTOPRAZOLE SODIUM 40 MG PO TBEC Oral Take 40 mg by mouth daily as needed. For acid reflux    . SIMVASTATIN 40 MG PO TABS Oral Take 40 mg by mouth every evening.    . VENLAFAXINE HCL ER 150 MG PO CP24 Oral Take 150 mg by mouth 2 (two)  times daily.      BP 175/59  Pulse 98  Temp 98.3 F (36.8 C) (Oral)  Resp 20  Ht 5' 3.5" (1.613 m)  Wt 225 lb (102.059 kg)  BMI 39.23 kg/m2  SpO2 96%  Physical Exam  Nursing note and vitals reviewed. Constitutional: She is oriented to person, place, and time. She appears well-developed and well-nourished. No distress.  HENT:  Head: Normocephalic and atraumatic.  Right Ear: External ear normal.  Left Ear: External ear normal.  Eyes: EOM are normal.  Neck: Normal range of motion. Neck supple. No tracheal deviation present.  Cardiovascular: Normal rate.   No murmur heard. Pulmonary/Chest: Effort normal. No respiratory distress.  Abdominal: Soft. There is  no tenderness.  Musculoskeletal: Normal range of motion.  Neurological: She is alert and oriented to person, place, and time.  Skin: Skin is warm and dry. There is erythema (Swollen erythematous right 2nd toe with no focal fluctuance with redness extended to foot and proximal medial lower leg).  Psychiatric: She has a normal mood and affect. Her behavior is normal.    ED Course  Procedures (including critical care time)  DIAGNOSTIC STUDIES: Oxygen Saturation is 96% on room air, adequate by my interpretation.    COORDINATION OF CARE:  3:26PM - EDMD will order blood w/u, celocin, and IV fluids for the pt.   Labs Reviewed  GLUCOSE, CAPILLARY - Abnormal; Notable for the following:    Glucose-Capillary 328 (*)     All other components within normal limits  CBC WITH DIFFERENTIAL - Abnormal; Notable for the following:    RBC 2.97 (*)     Hemoglobin 9.7 (*)     HCT 28.1 (*)     All other components within normal limits  BASIC METABOLIC PANEL - Abnormal; Notable for the following:    Sodium 134 (*)     Glucose, Bld 330 (*)     GFR calc non Af Amer 61 (*)     GFR calc Af Amer 71 (*)     All other components within normal limits   No results found.   No diagnosis found.    MDM  Diabetic toe infection with proximal erythema and fever at home. Will given IV Clinda, IVF for hyperglycemia and reassess.   I personally performed the services described in the documentation, which were scribed in my presence. The recorded information has been reviewed and considered.     Note: Patient was in the department during unscheduled downtime. Her disposition was competed on paper forms. She did t want admission or Obs protocol for her cellulitis given a dose of Clinda in the ED and Rx for same. CBG improved with fluids. Advised 24hr followup.       Sherri B. Bernette Mayers, MD 11/26/11 1610  Bonnita Levan. Bernette Mayers, MD 11/26/11 [phone]

## 2011-11-25 NOTE — ED Notes (Signed)
P_t states her right 2nd toe has been painful and swollen for 2 days. Hx diabetes.

## 2011-11-26 LAB — GLUCOSE, CAPILLARY: Glucose-Capillary: 178 mg/dL — ABNORMAL HIGH (ref 70–99)

## 2011-12-31 ENCOUNTER — Ambulatory Visit (HOSPITAL_BASED_OUTPATIENT_CLINIC_OR_DEPARTMENT_OTHER): Payer: BC Managed Care – PPO

## 2011-12-31 ENCOUNTER — Other Ambulatory Visit (HOSPITAL_BASED_OUTPATIENT_CLINIC_OR_DEPARTMENT_OTHER): Payer: BC Managed Care – PPO | Admitting: Lab

## 2011-12-31 ENCOUNTER — Ambulatory Visit (HOSPITAL_BASED_OUTPATIENT_CLINIC_OR_DEPARTMENT_OTHER): Payer: BC Managed Care – PPO | Admitting: Hematology & Oncology

## 2011-12-31 VITALS — BP 135/71 | HR 78 | Temp 97.7°F | Resp 20 | Ht 63.0 in | Wt 223.0 lb

## 2011-12-31 DIAGNOSIS — N189 Chronic kidney disease, unspecified: Secondary | ICD-10-CM

## 2011-12-31 DIAGNOSIS — E119 Type 2 diabetes mellitus without complications: Secondary | ICD-10-CM

## 2011-12-31 DIAGNOSIS — N289 Disorder of kidney and ureter, unspecified: Secondary | ICD-10-CM

## 2011-12-31 DIAGNOSIS — D631 Anemia in chronic kidney disease: Secondary | ICD-10-CM

## 2011-12-31 DIAGNOSIS — D649 Anemia, unspecified: Secondary | ICD-10-CM

## 2011-12-31 DIAGNOSIS — D509 Iron deficiency anemia, unspecified: Secondary | ICD-10-CM

## 2011-12-31 DIAGNOSIS — C801 Malignant (primary) neoplasm, unspecified: Secondary | ICD-10-CM

## 2011-12-31 DIAGNOSIS — D510 Vitamin B12 deficiency anemia due to intrinsic factor deficiency: Secondary | ICD-10-CM

## 2011-12-31 LAB — CBC WITH DIFFERENTIAL (CANCER CENTER ONLY)
BASO%: 0.1 % (ref 0.0–2.0)
HCT: 31 % — ABNORMAL LOW (ref 34.8–46.6)
LYMPH#: 1.6 10*3/uL (ref 0.9–3.3)
MONO#: 0.5 10*3/uL (ref 0.1–0.9)
NEUT%: 70.7 % (ref 39.6–80.0)
RBC: 3.27 10*6/uL — ABNORMAL LOW (ref 3.70–5.32)
RDW: 12.9 % (ref 11.1–15.7)
WBC: 7.2 10*3/uL (ref 3.9–10.0)

## 2011-12-31 LAB — RETICULOCYTES (CHCC): Retic Ct Pct: 3 % — ABNORMAL HIGH (ref 0.4–2.3)

## 2011-12-31 LAB — IRON AND TIBC
%SAT: 36 % (ref 20–55)
Iron: 102 ug/dL (ref 42–145)
TIBC: 284 ug/dL (ref 250–470)

## 2011-12-31 MED ORDER — DARBEPOETIN ALFA-POLYSORBATE 300 MCG/0.6ML IJ SOLN
300.0000 ug | Freq: Once | INTRAMUSCULAR | Status: AC
  Administered 2011-12-31: 300 ug via SUBCUTANEOUS

## 2011-12-31 NOTE — Progress Notes (Signed)
This office note has been dictated.

## 2012-01-01 NOTE — Progress Notes (Signed)
CC:   [name], M.D. [name], M.D.  DIAGNOSES: 1. Anemia secondary to renal insufficiency. 2. Diabetes, marginal control. 3. Intermittent iron deficiency anemia.  CURRENT THERAPY: 1. IV iron as indicated. 2. Aranesp 300 mcg subcu as needed for hemoglobin less than 11.  INTERIM HISTORY:  [name] comes in for a followup.  Problem now is her left hip.  She is having a lot of problems with the left hip.  She sees Dr. Jerl Padilla for this later on this week.  We did give her iron back in early July.  Unfortunately, she still feels tired.  When I saw her, I wanted to give her Aranesp, but her family did not feel that this was a good idea.  As such, the Aranesp is on hold.  Her erythropoietin level is only 15 so I would think that Aranesp would probably not be a bad idea for her.  She is wondering about B12. I am checking a  B12 level on her today.  I told her that she can take vitamin B12 over-the-counter if she wished.  There has been no change in bowel or bladder habits.  She has had no bleeding.  There has been no leg swelling.  She has had no rashes.  PHYSICAL EXAMINATION:  This is a well-developed, well-nourished white female in no obvious distress.  Vital signs:  97.7, pulse 70, respiratory rate 20, blood pressure 135/71.  Weight is 223.  Head and neck:  Normocephalic, atraumatic skull.  There are no ocular or oral lesions.  She has no palpable cervical or supraclavicular lymph nodes. Lungs:  Clear bilaterally.  Cardiac:  Regular rate and rhythm with normal S1, S2.  There are no murmurs, rubs or bruits.  Abdomen:  Soft with good bowel sounds.  There is no palpable abdominal mass.  She is somewhat  obese.  There is a well-healed cholecystectomy scar.  There is no palpable splenomegaly.  Back:  No tenderness over the spine, ribs, or hips.  Extremities:  No clubbing, cyanosis or edema.  Skin:  No rashes, ecchymosis or petechia.  LABORATORY STUDIES:  White cell  count 7.2, hemoglobin 10.7, hematocrit 31, platelet count 199.  MCV is 95.  IMPRESSION:  [name] is a 61 year old white female with multifactorial anemia.  Her main problem, I think, is anemia of renal insufficiency.  Her erythropoietin level is quite low.  There is still some hesitancy to take the Aranesp.  I told her that if Aranesp is going to be used, we have to try to keep her on schedule. Again, her family has reservations about this.  I do not think she is iron deficient.  Her iron was given to her 6 weeks ago.  .  This should be enough to get her iron levels up.  She is diabetic.  I think as long as her diabetes is not under good control, anemia will be an issue.  She also has this hip issue.  It sounds like she may need to have surgery for this.  I told her that if she did need surgery, that I would really recommend the Aranesp.  I want see her back in another 3 weeks.  I want to try to stay on top of this so that we try to get her feeling better.    ______________________________ Josph Macho, M.D. PRE/MEDQ  D:  12/31/2011  T:  01/01/2012  Job:  [id number]

## 2012-01-22 ENCOUNTER — Ambulatory Visit: Payer: BC Managed Care – PPO

## 2012-01-22 ENCOUNTER — Other Ambulatory Visit (HOSPITAL_BASED_OUTPATIENT_CLINIC_OR_DEPARTMENT_OTHER): Payer: BC Managed Care – PPO | Admitting: Lab

## 2012-01-22 ENCOUNTER — Ambulatory Visit (HOSPITAL_BASED_OUTPATIENT_CLINIC_OR_DEPARTMENT_OTHER): Payer: BC Managed Care – PPO | Admitting: Hematology & Oncology

## 2012-01-22 VITALS — BP 141/80 | HR 80 | Temp 98.0°F | Resp 18 | Ht 63.0 in | Wt 224.0 lb

## 2012-01-22 DIAGNOSIS — N189 Chronic kidney disease, unspecified: Secondary | ICD-10-CM

## 2012-01-22 DIAGNOSIS — D631 Anemia in chronic kidney disease: Secondary | ICD-10-CM

## 2012-01-22 DIAGNOSIS — D649 Anemia, unspecified: Secondary | ICD-10-CM

## 2012-01-22 DIAGNOSIS — D509 Iron deficiency anemia, unspecified: Secondary | ICD-10-CM

## 2012-01-22 DIAGNOSIS — N039 Chronic nephritic syndrome with unspecified morphologic changes: Secondary | ICD-10-CM

## 2012-01-22 DIAGNOSIS — N289 Disorder of kidney and ureter, unspecified: Secondary | ICD-10-CM

## 2012-01-22 LAB — CBC WITH DIFFERENTIAL (CANCER CENTER ONLY)
BASO#: 0 10*3/uL (ref 0.0–0.2)
Eosinophils Absolute: 0 10*3/uL (ref 0.0–0.5)
HGB: 11 g/dL — ABNORMAL LOW (ref 11.6–15.9)
LYMPH%: 25.1 % (ref 14.0–48.0)
MCH: 32.9 pg (ref 26.0–34.0)
MCV: 96 fL (ref 81–101)
MONO%: 8 % (ref 0.0–13.0)
RBC: 3.34 10*6/uL — ABNORMAL LOW (ref 3.70–5.32)

## 2012-01-22 LAB — RETICULOCYTES (CHCC)
ABS Retic: 92.6 10*3/uL (ref 19.0–186.0)
RBC.: 3.43 MIL/uL — ABNORMAL LOW (ref 3.87–5.11)

## 2012-01-22 LAB — FERRITIN: Ferritin: 358 ng/mL — ABNORMAL HIGH (ref 10–291)

## 2012-01-22 LAB — CHCC SATELLITE - SMEAR

## 2012-01-22 NOTE — Patient Instructions (Signed)
Call if problems 

## 2012-01-22 NOTE — Progress Notes (Signed)
This office note has been dictated.

## 2012-01-23 NOTE — Progress Notes (Signed)
CC:   Donalee Citrin, M.D. Lubertha Basque Jerl Santos, M.D. Lupita Raider, M.D.  DIAGNOSES: 1. Anemia secondary to renal insufficiency. 2. Intermittent iron deficiency anemia. 3. Insulin dependent diabetes.  CURRENT THERAPY: 1. Aranesp 300 mcg subcu as needed for hemoglobin less than 11. 2. IV iron as indicated.  INTERIM HISTORY:  Ms. Magner comes in for followup.  Unfortunately, the pain in her hip is because of her back.  She sees Dr. Donalee Citrin of neurosurgery.  She has already had I think 2 back surgeries.  She had a spinal fusion back in 1997.  She is responding to Aranesp.  We gave her Aranesp back in August.  Her iron studies have been okay.  Her hemoglobin has gradually come up.  I thought about maybe B12 deficiency.  Vitamin B12 level was 428 back in August.  When we saw her back in August, her ferritin was 437 with an iron saturation of 36%.  PHYSICAL EXAMINATION:  General:  This is a well-developed, well- nourished white female in no obvious distress.  Vital signs:  98, pulse 80, respiratory rate 18, blood pressure 141/60.  Weight is 224.  Head and neck:  Normocephalic, atraumatic skull.  There are no ocular or oral lesions.  There are no palpable cervical or supraclavicular lymph nodes. Lungs:  Clear bilaterally.  Cardiac:  Regular rate and rhythm with a normal S1 and S2.  There are no murmurs, rubs or bruits.  Abdomen:  Soft with good bowel sounds.  There is no palpable abdominal mass.  There is no palpable hepatosplenomegaly.  Back:  No tenderness over the spine, ribs or hips.  Extremities:  Shows no clubbing, cyanosis or edema. Neurological:  Shows no focal neurological deficits.  LABORATORY STUDIES:  White blood cell count 6, hemoglobin 11, hematocrit 32.1, platelet count 232.  IMPRESSION:  Ms. Abrigo is a very nice 61 year old white female with anemia secondary to renal insufficiency.  Her erythropoietin level has been very low.  Again, she responds to erythropoietin.   She has had no complications from this.  Her hemoglobin is high enough now that we can hold off on the ESA.  I told Ms. Wojciak that she needs to let me know when she is going to have surgery.  It sounds like she will have surgery.  If so, then we will certainly see her beforehand so we can maximize her hemoglobin.  I want to see her back myself in about 6 weeks or so.    ______________________________ Josph Macho, M.D. PRE/MEDQ  D:  01/22/2012  T:  01/22/2012  Job:  [id number]

## 2012-02-09 ENCOUNTER — Emergency Department (HOSPITAL_BASED_OUTPATIENT_CLINIC_OR_DEPARTMENT_OTHER): Payer: BC Managed Care – PPO

## 2012-02-09 ENCOUNTER — Encounter (HOSPITAL_BASED_OUTPATIENT_CLINIC_OR_DEPARTMENT_OTHER): Payer: Self-pay | Admitting: Emergency Medicine

## 2012-02-09 DIAGNOSIS — Z79899 Other long term (current) drug therapy: Secondary | ICD-10-CM

## 2012-02-09 DIAGNOSIS — M9080 Osteopathy in diseases classified elsewhere, unspecified site: Secondary | ICD-10-CM | POA: Diagnosis present

## 2012-02-09 DIAGNOSIS — I10 Essential (primary) hypertension: Secondary | ICD-10-CM

## 2012-02-09 DIAGNOSIS — L02619 Cutaneous abscess of unspecified foot: Secondary | ICD-10-CM | POA: Diagnosis present

## 2012-02-09 DIAGNOSIS — N189 Chronic kidney disease, unspecified: Secondary | ICD-10-CM

## 2012-02-09 DIAGNOSIS — L409 Psoriasis, unspecified: Secondary | ICD-10-CM

## 2012-02-09 DIAGNOSIS — E119 Type 2 diabetes mellitus without complications: Secondary | ICD-10-CM

## 2012-02-09 DIAGNOSIS — M869 Osteomyelitis, unspecified: Secondary | ICD-10-CM

## 2012-02-09 DIAGNOSIS — M5030 Other cervical disc degeneration, unspecified cervical region: Secondary | ICD-10-CM

## 2012-02-09 DIAGNOSIS — D509 Iron deficiency anemia, unspecified: Secondary | ICD-10-CM

## 2012-02-09 DIAGNOSIS — M5137 Other intervertebral disc degeneration, lumbosacral region: Secondary | ICD-10-CM

## 2012-02-09 DIAGNOSIS — D631 Anemia in chronic kidney disease: Secondary | ICD-10-CM

## 2012-02-09 DIAGNOSIS — D649 Anemia, unspecified: Secondary | ICD-10-CM | POA: Diagnosis present

## 2012-02-09 DIAGNOSIS — M51379 Other intervertebral disc degeneration, lumbosacral region without mention of lumbar back pain or lower extremity pain: Secondary | ICD-10-CM

## 2012-02-09 DIAGNOSIS — E1149 Type 2 diabetes mellitus with other diabetic neurological complication: Secondary | ICD-10-CM | POA: Diagnosis present

## 2012-02-09 DIAGNOSIS — L408 Other psoriasis: Secondary | ICD-10-CM | POA: Diagnosis present

## 2012-02-09 DIAGNOSIS — E1169 Type 2 diabetes mellitus with other specified complication: Principal | ICD-10-CM | POA: Diagnosis present

## 2012-02-09 DIAGNOSIS — E1142 Type 2 diabetes mellitus with diabetic polyneuropathy: Secondary | ICD-10-CM | POA: Diagnosis present

## 2012-02-09 HISTORY — DX: Sleep apnea, unspecified: G47.30

## 2012-02-09 LAB — CBC WITH DIFFERENTIAL/PLATELET
Basophils Absolute: 0 10*3/uL (ref 0.0–0.1)
Basophils Relative: 0 % (ref 0–1)
HCT: 30.7 % — ABNORMAL LOW (ref 36.0–46.0)
MCHC: 34.2 g/dL (ref 30.0–36.0)
Monocytes Absolute: 0.7 10*3/uL (ref 0.1–1.0)
Neutro Abs: 4.8 10*3/uL (ref 1.7–7.7)
Neutrophils Relative %: 69 % (ref 43–77)
RDW: 12.9 % (ref 11.5–15.5)

## 2012-02-09 LAB — GLUCOSE, CAPILLARY

## 2012-02-09 LAB — BASIC METABOLIC PANEL
Chloride: 101 mEq/L (ref 96–112)
Creatinine, Ser: 0.9 mg/dL (ref 0.50–1.10)
GFR calc Af Amer: 80 mL/min — ABNORMAL LOW (ref >90)
GFR calc non Af Amer: 69 mL/min — ABNORMAL LOW (ref >90)

## 2012-02-09 LAB — SEDIMENTATION RATE: Sed Rate: 21 mm/hr (ref 0–22)

## 2012-02-09 MED ORDER — VANCOMYCIN HCL IN DEXTROSE 1-5 GM/200ML-% IV SOLN
1000.0000 mg | Freq: Two times a day (BID) | INTRAVENOUS | Status: DC
  Filled 2012-02-09: qty 200

## 2012-02-09 MED ORDER — CLINDAMYCIN PHOSPHATE 600 MG/50ML IV SOLN
600.0000 mg | Freq: Once | INTRAVENOUS | Status: AC
  Administered 2012-02-09: 600 mg via INTRAVENOUS
  Filled 2012-02-09: qty 50

## 2012-02-09 MED ORDER — SIMVASTATIN 40 MG PO TABS
40.0000 mg | ORAL_TABLET | Freq: Every evening | ORAL | Status: DC
  Administered 2012-02-09 – 2012-02-12 (×4): 40 mg via ORAL
  Filled 2012-02-09 (×5): qty 1

## 2012-02-09 MED ORDER — LISINOPRIL 20 MG PO TABS
20.0000 mg | ORAL_TABLET | Freq: Every day | ORAL | Status: DC
  Administered 2012-02-10 – 2012-02-13 (×4): 20 mg via ORAL
  Filled 2012-02-09 (×4): qty 1

## 2012-02-09 MED ORDER — SODIUM CHLORIDE 0.9 % IJ SOLN: 3.0000 mL | INTRAMUSCULAR | Status: DC | PRN

## 2012-02-09 MED ORDER — VANCOMYCIN HCL 1000 MG IV SOLR
2500.0000 mg | INTRAVENOUS | Status: AC
  Administered 2012-02-09: 2500 mg via INTRAVENOUS
  Filled 2012-02-09: qty 2500

## 2012-02-09 MED ORDER — GLIMEPIRIDE 4 MG PO TABS
8.0000 mg | ORAL_TABLET | Freq: Every day | ORAL | Status: DC
  Administered 2012-02-09 – 2012-02-11 (×3): 8 mg via ORAL
  Filled 2012-02-09 (×4): qty 2

## 2012-02-09 MED ORDER — SODIUM CHLORIDE 0.9 % IJ SOLN
3.0000 mL | Freq: Two times a day (BID) | INTRAMUSCULAR | Status: DC
  Administered 2012-02-11 – 2012-02-12 (×2): 3 mL via INTRAVENOUS

## 2012-02-09 MED ORDER — ALBUTEROL SULFATE HFA 108 (90 BASE) MCG/ACT IN AERS: 2.0000 | INHALATION_SPRAY | Freq: Four times a day (QID) | RESPIRATORY_TRACT | Status: DC | PRN

## 2012-02-09 MED ORDER — VENLAFAXINE HCL ER 150 MG PO CP24
150.0000 mg | ORAL_CAPSULE | Freq: Two times a day (BID) | ORAL | Status: DC
  Administered 2012-02-09 – 2012-02-13 (×8): 150 mg via ORAL
  Filled 2012-02-09 (×9): qty 1

## 2012-02-09 MED ORDER — ONDANSETRON HCL 4 MG/2ML IJ SOLN: 4.0000 mg | Freq: Four times a day (QID) | INTRAMUSCULAR | Status: DC | PRN

## 2012-02-09 MED ORDER — LISINOPRIL-HYDROCHLOROTHIAZIDE 20-25 MG PO TABS: 1.0000 | ORAL_TABLET | Freq: Every day | ORAL | Status: DC

## 2012-02-09 MED ORDER — HYDROCHLOROTHIAZIDE 25 MG PO TABS
25.0000 mg | ORAL_TABLET | Freq: Every day | ORAL | Status: DC
  Administered 2012-02-10 – 2012-02-11 (×2): 25 mg via ORAL
  Filled 2012-02-09 (×3): qty 1

## 2012-02-09 MED ORDER — MEPERIDINE HCL 50 MG PO TABS
50.0000 mg | ORAL_TABLET | ORAL | Status: DC | PRN
  Administered 2012-02-12 – 2012-02-13 (×2): 50 mg via ORAL
  Filled 2012-02-09 (×2): qty 1

## 2012-02-09 MED ORDER — INFLUENZA VIRUS VACC SPLIT PF IM SUSP
0.5000 mL | INTRAMUSCULAR | Status: AC
Start: 2012-02-10 — End: 2012-02-11
  Filled 2012-02-09 (×2): qty 0.5

## 2012-02-09 MED ORDER — INSULIN ASPART 100 UNIT/ML ~~LOC~~ SOLN
0.0000 [IU] | Freq: Three times a day (TID) | SUBCUTANEOUS | Status: DC
  Administered 2012-02-10: 5 [IU] via SUBCUTANEOUS
  Administered 2012-02-10: 3 [IU] via SUBCUTANEOUS
  Administered 2012-02-10: 2 [IU] via SUBCUTANEOUS
  Administered 2012-02-11 (×2): 5 [IU] via SUBCUTANEOUS
  Administered 2012-02-11: 8 [IU] via SUBCUTANEOUS
  Administered 2012-02-12: 3 [IU] via SUBCUTANEOUS
  Administered 2012-02-12: 5 [IU] via SUBCUTANEOUS
  Administered 2012-02-13: 3 [IU] via SUBCUTANEOUS

## 2012-02-09 MED ORDER — LORAZEPAM 1 MG PO TABS
1.0000 mg | ORAL_TABLET | Freq: Two times a day (BID) | ORAL | Status: DC
  Administered 2012-02-09 – 2012-02-12 (×7): 1 mg via ORAL
  Filled 2012-02-09 (×7): qty 1

## 2012-02-09 MED ORDER — ONDANSETRON HCL 4 MG PO TABS: 4.0000 mg | ORAL_TABLET | Freq: Four times a day (QID) | ORAL | Status: DC | PRN

## 2012-02-09 MED ORDER — SODIUM CHLORIDE 0.9 % IV SOLN
Freq: Once | INTRAVENOUS | Status: AC
  Administered 2012-02-09: 14:00:00 via INTRAVENOUS

## 2012-02-09 MED ORDER — SODIUM CHLORIDE 0.9 % IV SOLN: 250.0000 mL | INTRAVENOUS | Status: DC | PRN

## 2012-02-09 MED ORDER — LORAZEPAM 1 MG PO TABS: 1.0000 mg | ORAL_TABLET | Freq: Two times a day (BID) | ORAL | Status: DC

## 2012-02-09 MED ORDER — VANCOMYCIN HCL IN DEXTROSE 1-5 GM/200ML-% IV SOLN
1000.0000 mg | Freq: Two times a day (BID) | INTRAVENOUS | Status: DC
  Administered 2012-02-10 – 2012-02-12 (×5): 1000 mg via INTRAVENOUS
  Filled 2012-02-09 (×8): qty 200

## 2012-02-09 MED ORDER — DEXTROSE 5 % IV SOLN
1.0000 g | Freq: Three times a day (TID) | INTRAVENOUS | Status: DC
  Administered 2012-02-09 – 2012-02-12 (×8): 1 g via INTRAVENOUS
  Filled 2012-02-09 (×11): qty 1

## 2012-02-09 NOTE — Consult Note (Signed)
Padilla,[name], MD Chief Complaint: Right 2nd toe infection History: With history of diabetes and diabetic neuropathy presented to the ED after noticing some localized swelling at her 2 digit of her right foot today. Denies any recent trauma and states that today is the first day she has noticed it this swollen and red. It is not painful to the touch. Nothing she is aware of makes it better or worse. Problem since first noticed has been persistent. Pt denies any fever, chills, nausea, or emesis. The problem at this juncture is not associated with increased discomfort.  Was evaluated in the ED and found to have osteomyelitis on x ray and we were subsequently consulted for further evaluation and recommendations. While in the ED patient was given clindamycin and found to be afebrile with a normal WBC of 7.0.  Past Medical History  Diagnosis Date  . Anemia in chronic renal disease 06/05/2011  . Psoriasis 06/05/2011  . DDD (degenerative disc disease), cervical 06/05/2011  . DDD (degenerative disc disease), lumbosacral 06/05/2011  . HTN (hypertension) 06/05/2011  . Diabetes mellitus     Allergies  Allergen Reactions  . Ultram (Tramadol) Rash  . Betadine (Povidone Iodine) Other (See Comments)    Skin peels  . Contrast Media (Iodinated Diagnostic Agents) Nausea And Vomiting  . Fentanyl And Related     Fentanyl patch  . Hydrocodone Other (See Comments)    headache  . Morphine And Related Nausea And Vomiting  . Percocet (Oxycodone-Acetaminophen) Other (See Comments)    Headache   . Sulfa Antibiotics Itching  . Valium Other (See Comments)    headache  . Vicodin (Hydrocodone-Acetaminophen) Other (See Comments)    headache  . Darvocet (Propoxyphene-Acetaminophen) Rash and Other (See Comments)    headache  . Wellbutrin (Bupropion Hcl) Rash    No current facility-administered medications on file prior to encounter.   Current Outpatient Prescriptions on File Prior to Encounter  Medication Sig  Dispense Refill  . albuterol (PROVENTIL HFA;VENTOLIN HFA) 108 (90 BASE) MCG/ACT inhaler Inhale 2 puffs into the lungs every 6 (six) hours as needed. For shortness of breath      . glimepiride (AMARYL) 4 MG tablet Take 8 mg by mouth at bedtime.       Marland Kitchen lisinopril-hydrochlorothiazide (PRINZIDE,ZESTORETIC) 20-25 MG per tablet Take 1 tablet by mouth daily.      Marland Kitchen LORazepam (ATIVAN) 1 MG tablet Take 1 mg by mouth 2 (two) times daily before lunch and supper.      . metFORMIN (GLUCOPHAGE) 1000 MG tablet Take 1,000 mg by mouth 2 (two) times daily with a meal.      . pantoprazole (PROTONIX) 40 MG tablet Take 40 mg by mouth daily as needed. For acid reflux      . simvastatin (ZOCOR) 40 MG tablet Take 40 mg by mouth every evening.      . venlafaxine (EFFEXOR-XR) 150 MG 24 hr capsule Take 150 mg by mouth 2 (two) times daily.        Physical Exam: A+O X3 NO SOB/CP ABD: soft/nt No pain with rom of the UE/LE Decreased DP/PT pulses bilaterally Decreased sensation to LT diffusely int he LE (c/w diabetic neuropathy) 2nd toe: swollen, red, ulceration at the tip.  No active drainage  Image: Dg Toe 2nd Right  02/09/2012  *RADIOLOGY REPORT*  Clinical Data: Wound and infection of the distal right second toe.  RIGTH SECOND TOE  Comparison: Right foot films dated 11/26/2011  Findings: There is evidence of new bony destruction and  erosion involving the lateral aspect of the second distal phalanx. Associated surrounding soft tissue swelling present.  Findings are consistent with osteomyelitis.  No soft tissue gas is identified.  IMPRESSION: New bony destruction involving the second distal phalanx.  Findings are consistent with osteomyelitis.   Original Report Authenticated By: Reola Calkins, M.D.     A/P:  Patient with 4 month history of right 2nd toe infection.  Initially treated with antibiotics.  Over the course of the last few weeks the ulceration has returned and the swelling and erythema has worsened over  the last few days.  AS a result she was admitted for IV Abx and further management of the infection. Radiographs consistent with osteomyelitis of the 2nd toe.  Ortho consultation requested. Plan: patient is not currently septic and does not require urgent or emergent surgical management Recommend  1. MRI of the foot to r/o abscess formation and to determine extent of the infection.     2. Also recommend vascular study (ABI) to determine likely hood that abx treatment will be successful.   3. Will contact ortho tech for rocker bottom shoe to take pressure of the toe during ambulation.   4. Will review case with Dr Victorino Dike for definitive management

## 2012-02-09 NOTE — H&P (Signed)
[facility] History and Physical  [name] RUE:[id number] DOB: October 19, 1950 DOA: 02/09/2012  Referring physician: Elson Areas and ED attending Dr. Ignacia Palma PCP: Lupita Raider, MD  Specialists: Consult Orthopaedic surgeon  Chief Complaint: Swollen and red 2 digit of right foot  HPI: [name] is a 61 y.o. female  With history of diabetes and diabetic neuropathy presented to the ED after noticing some localized swelling at her 2 digit of her right foot today.  Denies any recent trauma and states that today is the first day she has noticed it this swollen and red.  It is not painful to the touch.  Nothing she is aware of makes it better or worse.  Problem since first noticed has been persistent.  Pt denies any fever, chills, nausea, or emesis.  The problem at this juncture is not associated with increased discomfort.  Was evaluated in the ED and found to have osteomyelitis on x ray and we were subsequently consulted for further evaluation and recommendations.  While in the ED patient was given clindamycin and found to be afebrile with a normal WBC of 7.0.  Review of Systems: The patient denies anorexia, fever, weight loss,, vision loss, decreased hearing, hoarseness, chest pain, syncope, dyspnea on exertion, peripheral edema, balance deficits, hemoptysis, abdominal pain, melena, hematochezia, severe indigestion/heartburn, hematuria, incontinence, genital sores, muscle weakness, suspicious skin lesions, transient blindness, difficulty walking, depression, unusual weight change, abnormal bleeding, enlarged lymph nodes, angioedema, and breast masses.    Past Medical History  Diagnosis Date  . Anemia in chronic renal disease 06/05/2011  . Psoriasis 06/05/2011  . DDD (degenerative disc disease), cervical 06/05/2011  . DDD (degenerative disc disease), lumbosacral 06/05/2011  . HTN (hypertension) 06/05/2011  . Diabetes mellitus    Past Surgical History  Procedure Date  . Back  surgery   . Cholecystectomy   . Elbow surgery   . Abdominal hysterectomy    Social History:  reports that she has never smoked. She does not have any smokeless tobacco history on file. She reports that she does not drink alcohol or use illicit drugs. Lives at home  Can patient participate in ADLs? yes  Allergies  Allergen Reactions  . Ultram (Tramadol) Rash  . Betadine (Povidone Iodine) Other (See Comments)    Skin peels  . Contrast Media (Iodinated Diagnostic Agents) Nausea And Vomiting  . Fentanyl And Related     Fentanyl patch  . Hydrocodone Other (See Comments)    headache  . Morphine And Related Nausea And Vomiting  . Percocet (Oxycodone-Acetaminophen) Other (See Comments)    Headache   . Sulfa Antibiotics Itching  . Valium Other (See Comments)    headache  . Vicodin (Hydrocodone-Acetaminophen) Other (See Comments)    headache  . Darvocet (Propoxyphene-Acetaminophen) Rash and Other (See Comments)    headache  . Wellbutrin (Bupropion Hcl) Rash    No family history on file. Father died of massive heart attack, strokes, and cancer run in the family  Prior to Admission medications   Medication Sig Start Date End Date Taking? Authorizing Provider  albuterol (PROVENTIL HFA;VENTOLIN HFA) 108 (90 BASE) MCG/ACT inhaler Inhale 2 puffs into the lungs every 6 (six) hours as needed. For shortness of breath   Yes Historical Provider, MD  glimepiride (AMARYL) 4 MG tablet Take 8 mg by mouth at bedtime.    Yes Historical Provider, MD  lisinopril-hydrochlorothiazide (PRINZIDE,ZESTORETIC) 20-25 MG per tablet Take 1 tablet by mouth daily.   Yes Historical Provider, MD  LORazepam (ATIVAN)  1 MG tablet Take 1 mg by mouth 2 (two) times daily before lunch and supper.   Yes Historical Provider, MD  metFORMIN (GLUCOPHAGE) 1000 MG tablet Take 1,000 mg by mouth 2 (two) times daily with a meal.   Yes Historical Provider, MD  pantoprazole (PROTONIX) 40 MG tablet Take 40 mg by mouth daily as needed.  For acid reflux   Yes Historical Provider, MD  simvastatin (ZOCOR) 40 MG tablet Take 40 mg by mouth every evening.   Yes Historical Provider, MD  venlafaxine (EFFEXOR-XR) 150 MG 24 hr capsule Take 150 mg by mouth 2 (two) times daily.   Yes Historical Provider, MD   Physical Exam: Filed Vitals:   02/09/12 1128 02/09/12 1609 02/09/12 1710  BP: 158/83 133/52 165/70  Pulse: 95 88 89  Temp: 98.7 F (37.1 C)  99 F (37.2 C)  TempSrc: Oral  Oral  Resp: 16 18 20   Height: 5\' 3"  (1.6 m)  5\' 3"  (1.6 m)  Weight: 99.791 kg (220 lb)  103.4 kg (227 lb 15.3 oz)  SpO2: 97% 98% 97%     General:  Pt in NAD, A and O x 3  Eyes: EOMI, no discharge  ENT: no masses on visual examination, moist mucus membranes  Neck: supple, no goiter  Cardiovascular: RRR, No MRG  Respiratory: CTA BL, no wheezes  Abdomen: Soft, nt, nd  Skin: warm and dry. At digit 2 of right foot there is erythema and edema near nail bed with open sores with no purulent discharge on examination.  Musculoskeletal: no cyanosis or clubbing  Psychiatric: mood and affect appropriate  Neurologic: answers questions appropriately, moves all extremities.  Labs on Admission:  Basic Metabolic Panel:  Lab 02/09/12 1610  NA 135  K 4.2  CL 101  CO2 23  GLUCOSE 136*  BUN 22  CREATININE 0.90  CALCIUM 9.2  MG --  PHOS --   Liver Function Tests: No results found for this basename: AST:5,ALT:5,ALKPHOS:5,BILITOT:5,PROT:5,ALBUMIN:5 in the last 168 hours No results found for this basename: LIPASE:5,AMYLASE:5 in the last 168 hours No results found for this basename: AMMONIA:5 in the last 168 hours CBC:  Lab 02/09/12 1340  WBC 7.0  NEUTROABS 4.8  HGB 10.5*  HCT 30.7*  MCV 93.6  PLT 207   Cardiac Enzymes: No results found for this basename: CKTOTAL:5,CKMB:5,CKMBINDEX:5,TROPONINI:5 in the last 168 hours  BNP (last 3 results) No results found for this basename: PROBNP:3 in the last 8760 hours CBG:  Lab 02/09/12 1723    GLUCAP 153*    Radiological Exams on Admission: Dg Toe 2nd Right  02/09/2012  *RADIOLOGY REPORT*  Clinical Data: Wound and infection of the distal right second toe.  RIGTH SECOND TOE  Comparison: Right foot films dated 11/26/2011  Findings: There is evidence of new bony destruction and erosion involving the lateral aspect of the second distal phalanx. Associated surrounding soft tissue swelling present.  Findings are consistent with osteomyelitis.  No soft tissue gas is identified.  IMPRESSION: New bony destruction involving the second distal phalanx.  Findings are consistent with osteomyelitis.   Original Report Authenticated By: Reola Calkins, M.D.     EKG: Independently reviewed. EKG on 09/13/10 reviewed normal sinus rhythm with no st elevation or depression  Assessment/Plan Active Problems:  Anemia in chronic renal disease  DM (diabetes mellitus)  DDD (degenerative disc disease), lumbosacral  HTN (hypertension)  Osteomyelitis   1. Osteomyelitis: - obtain CRP, ESR, wound culture, blood cultures x 2 - Start broad spectrum  antibiotics with vancomycin and cefepime (for gram negative coverage) - consult orthopaedic surgeon for further recommendations +/- debridement - place NPO after midnight. - Depending on evaluation and recommendations from specialist patient may require therapy further therapy with either prolonged IV antibiotic administration or possible surgical intervention which likely will require prolonged hospitalization (past 2 days) - Will consider ID consult pending clinical course.  2. DM - continue glimiperide - hold metformin - place on SSI and diabetic diet  3. DDD - continue home pain regimen per my discussion with patient with demerol  4. Anemia - Stable at this juncture - anemia panel reviewed, patient has history of iron deficiency anemia in the past but iron levels within normal limits. - Monitor levels  Code Status: Full Family Communication: Spoke  with patient and husband at bedside Disposition Plan: Pending specialist evaluation and recommendations  Time spent: > 55 minutes.  Penny Pia [facility] Pager [phone]  If 7PM-7AM, please contact night-coverage [email or web address] Password [email or web address] 02/09/2012, 6:11 PM

## 2012-02-09 NOTE — ED Provider Notes (Signed)
Medical screening examination/treatment/procedure(s) were conducted as a shared visit with non-physician practitioner(s) and myself.  I personally evaluated the patient during the encounter 2:41 PM  61 yo diabetic woman has red swollen right second toe. X-rays show osteomyelitis of the right second toe distal phalanx. Will need admission for IV antibiotics, ? Ortho consult.       Carleene Cooper III, MD 02/09/12 [phone]

## 2012-02-09 NOTE — Progress Notes (Signed)
ANTIBIOTIC CONSULT NOTE - INITIAL  Pharmacy Consult for Vancomycin / Cefepime Indication: Osteomyelitis  Allergies  Allergen Reactions  . Ultram (Tramadol) Rash  . Betadine (Povidone Iodine) Other (See Comments)    Skin peels  . Contrast Media (Iodinated Diagnostic Agents) Nausea And Vomiting  . Fentanyl And Related     Fentanyl patch  . Hydrocodone Other (See Comments)    headache  . Morphine And Related Nausea And Vomiting  . Percocet (Oxycodone-Acetaminophen) Other (See Comments)    Headache   . Sulfa Antibiotics Itching  . Valium Other (See Comments)    headache  . Vicodin (Hydrocodone-Acetaminophen) Other (See Comments)    headache  . Darvocet (Propoxyphene-Acetaminophen) Rash and Other (See Comments)    headache  . Wellbutrin (Bupropion Hcl) Rash    Patient Measurements: Height: 5\' 3"  (160 cm) Weight: 227 lb 15.3 oz (103.4 kg) IBW/kg (Calculated) : 52.4    Vital Signs: Temp: 99 F (37.2 C) (10/05 1710) Temp src: Oral (10/05 1710) BP: 165/70 mmHg (10/05 1710) Pulse Rate: 89  (10/05 1710) Intake/Output from previous day:   Intake/Output from this shift:    Labs:  Basename 02/09/12 1340  WBC 7.0  HGB 10.5*  PLT 207  LABCREA --  CREATININE 0.90   Estimated Creatinine Clearance: 78.3 ml/min (by C-G formula based on Cr of 0.9). No results found for this basename: VANCOTROUGH:2,VANCOPEAK:2,VANCORANDOM:2,GENTTROUGH:2,GENTPEAK:2,GENTRANDOM:2,TOBRATROUGH:2,TOBRAPEAK:2,TOBRARND:2,AMIKACINPEAK:2,AMIKACINTROU:2,AMIKACIN:2, in the last 72 hours   Microbiology: No results found for this or any previous visit (from the past 720 hour(s)).  Medical History: Past Medical History  Diagnosis Date  . Anemia in chronic renal disease 06/05/2011  . Psoriasis 06/05/2011  . DDD (degenerative disc disease), cervical 06/05/2011  . DDD (degenerative disc disease), lumbosacral 06/05/2011  . HTN (hypertension) 06/05/2011  . Diabetes mellitus     Medications:  Prescriptions  prior to admission  Medication Sig Dispense Refill  . albuterol (PROVENTIL HFA;VENTOLIN HFA) 108 (90 BASE) MCG/ACT inhaler Inhale 2 puffs into the lungs every 6 (six) hours as needed. For shortness of breath      . glimepiride (AMARYL) 4 MG tablet Take 8 mg by mouth at bedtime.       Marland Kitchen lisinopril-hydrochlorothiazide (PRINZIDE,ZESTORETIC) 20-25 MG per tablet Take 1 tablet by mouth daily.      Marland Kitchen LORazepam (ATIVAN) 1 MG tablet Take 1 mg by mouth 2 (two) times daily before lunch and supper.      . metFORMIN (GLUCOPHAGE) 1000 MG tablet Take 1,000 mg by mouth 2 (two) times daily with a meal.      . pantoprazole (PROTONIX) 40 MG tablet Take 40 mg by mouth daily as needed. For acid reflux      . simvastatin (ZOCOR) 40 MG tablet Take 40 mg by mouth every evening.      . venlafaxine (EFFEXOR-XR) 150 MG 24 hr capsule Take 150 mg by mouth 2 (two) times daily.       Scheduled:    . sodium chloride   Intravenous Once  . clindamycin (CLEOCIN) IV  600 mg Intravenous Once  . glimepiride  8 mg Oral QHS  . insulin aspart  0-15 Units Subcutaneous TID WC  . lisinopril-hydrochlorothiazide  1 tablet Oral Daily  . LORazepam  1 mg Oral BID AC  . simvastatin  40 mg Oral QPM  . sodium chloride  3 mL Intravenous Q12H  . venlafaxine XR  150 mg Oral BID   Assessment: 61 yo female with PMH of diabetes admitted with osteomyelitis of the right second toe  distal phalanx per X-rays. Admitted for IV antibiotics (Vancomycin and Cefepime).  Her SCr is 0.9,  Estimated CrCl ~ 78 ml/min  Goal of Therapy:  Vancomycin trough level 15-20 mcg/ml  Plan:  Cefepime 1gm IV q8h Vancomycin 2500 mg x  Loading dose, then 1000 mg IV q12h.   Noah Delaine, RPh Clinical Pharmacist Pager: [phone] 02/09/2012,6:24 PM

## 2012-02-09 NOTE — ED Provider Notes (Signed)
History     CSN: [id number]  Arrival date & time 02/09/12  1105   First MD Initiated Contact with Patient 02/09/12 1209      Chief Complaint  Patient presents with  . Wound Check    (Consider location/radiation/quality/duration/timing/severity/associated sxs/prior treatment) Patient is a 61 y.o. female presenting with toe pain. The history is provided by the patient.  Toe Pain This is a recurrent problem. The current episode started yesterday. The problem occurs constantly. The problem has been gradually worsening. Associated symptoms include myalgias. Nothing aggravates the symptoms. She has tried rest for the symptoms. The treatment provided no relief.  Pt reports she had an infection her right 2nd toe in July.   Pt reports area began swelling again yesterday.   Pt reports last time she got iv antibiotics and oral antibiotics and infection resolved  Past Medical History  Diagnosis Date  . Anemia in chronic renal disease 06/05/2011  . Psoriasis 06/05/2011  . DDD (degenerative disc disease), cervical 06/05/2011  . DDD (degenerative disc disease), lumbosacral 06/05/2011  . HTN (hypertension) 06/05/2011  . Diabetes mellitus     Past Surgical History  Procedure Date  . Back surgery   . Cholecystectomy   . Elbow surgery   . Abdominal hysterectomy     No family history on file.  History  Substance Use Topics  . Smoking status: Never Smoker   . Smokeless tobacco: Not on file  . Alcohol Use: No    OB History    Grav Para Term Preterm Abortions TAB SAB Ect Mult Living                  Review of Systems  Musculoskeletal: Positive for myalgias.  Skin: Positive for wound.  All other systems reviewed and are negative.    Allergies  Ultram; Betadine; Contrast media; Fentanyl and related; Hydrocodone; Morphine and related; Percocet; Sulfa antibiotics; Valium; Vicodin; Darvocet; and Wellbutrin  Home Medications   Current Outpatient Rx  Name Route Sig Dispense Refill  .  ALBUTEROL SULFATE HFA 108 (90 BASE) MCG/ACT IN AERS Inhalation Inhale 2 puffs into the lungs every 6 (six) hours as needed. For shortness of breath    . CALCIUM CARBONATE-VITAMIN D 500-200 MG-UNIT PO TABS Oral Take 1 tablet by mouth daily.    . CHROMIUM PICOLINATE PO Oral Take 1 tablet by mouth daily.    Marland Kitchen GLIMEPIRIDE 4 MG PO TABS Oral Take 8 mg by mouth at bedtime.     Marland Kitchen HYDROCORTISONE 1 % EX CREA Topical Apply 1 application topically daily. For Psoriasis    . LISINOPRIL-HYDROCHLOROTHIAZIDE 20-25 MG PO TABS Oral Take 1 tablet by mouth daily.    Marland Kitchen LORAZEPAM 1 MG PO TABS Oral Take 1 mg by mouth 2 (two) times daily before lunch and supper.    Marland Kitchen METFORMIN HCL 1000 MG PO TABS Oral Take 1,000 mg by mouth 2 (two) times daily with a meal.    . PANTOPRAZOLE SODIUM 40 MG PO TBEC Oral Take 40 mg by mouth daily as needed. For acid reflux    . SIMVASTATIN 40 MG PO TABS Oral Take 40 mg by mouth every evening.    . VENLAFAXINE HCL ER 150 MG PO CP24 Oral Take 150 mg by mouth 2 (two) times daily.      BP 158/83  Pulse 95  Temp 98.7 F (37.1 C) (Oral)  Resp 16  Ht 5\' 3"  (1.6 m)  Wt 220 lb (99.791 kg)  BMI 38.97 kg/m2  SpO2 97%  Physical Exam  Nursing note and vitals reviewed. Constitutional: She appears well-developed and well-nourished.  HENT:  Head: Normocephalic.  Eyes: Pupils are equal, round, and reactive to light.  Cardiovascular: Normal rate.   Pulmonary/Chest: Effort normal.  Musculoskeletal: She exhibits tenderness.       Swollen red draining right 2nd toe,    Neurological: She is alert.  Skin: There is erythema.  Psychiatric: She has a normal mood and affect.    ED Course  Procedures (including critical care time)  Labs Reviewed - No data to display No results found.   No diagnosis found.  Xray shows osteomylitis  Pt given IV clindamycin.   Dr. Ignacia Palma in to see.    MDM  Pt to be admitted to hospitalist        Elson Areas, [place] 02/09/12 1447

## 2012-02-09 NOTE — ED Notes (Signed)
[facility] has been notified of room 5530 at [facility].

## 2012-02-09 NOTE — ED Notes (Signed)
Pt has wound to right second toe, has seemed to be getting worse since yesterday.  Was seen here for same several months ago and also seen her regular MD since then.   Pt here for recheck.  Pt states she feels some burning sensation.

## 2012-02-09 NOTE — Progress Notes (Signed)
2:41 PM 61 yo diabetic woman has red swollen right second toe.  X-rays show osteomyelitis of the right second toe distal phalanx.  Will need admission for IV antibiotics, ? Ortho consult.

## 2012-02-09 NOTE — Progress Notes (Signed)
Orthopedic Tech Progress Note Patient Details:  [name] 15-May-1950 [id number]  Ortho Devices Type of Ortho Device: Other (comment) (darco shoe) Ortho Device/Splint Location: (R) LE Ortho Device/Splint Interventions: Application   Jennye Moccasin 02/09/2012, 9:16 PM

## 2012-02-09 NOTE — ED Notes (Signed)
The patient's wound was wrapped with a wet to dry dressing.

## 2012-02-10 ENCOUNTER — Inpatient Hospital Stay (HOSPITAL_COMMUNITY): Payer: BC Managed Care – PPO

## 2012-02-10 DIAGNOSIS — M5030 Other cervical disc degeneration, unspecified cervical region: Secondary | ICD-10-CM

## 2012-02-10 DIAGNOSIS — R0989 Other specified symptoms and signs involving the circulatory and respiratory systems: Secondary | ICD-10-CM

## 2012-02-10 DIAGNOSIS — E119 Type 2 diabetes mellitus without complications: Secondary | ICD-10-CM

## 2012-02-10 LAB — BASIC METABOLIC PANEL
BUN: 20 mg/dL (ref 6–23)
Calcium: 9.2 mg/dL (ref 8.4–10.5)
Creatinine, Ser: 0.96 mg/dL (ref 0.50–1.10)
GFR calc Af Amer: 74 mL/min — ABNORMAL LOW (ref >90)
GFR calc non Af Amer: 64 mL/min — ABNORMAL LOW (ref >90)

## 2012-02-10 LAB — GLUCOSE, CAPILLARY
Glucose-Capillary: 217 mg/dL — ABNORMAL HIGH (ref 70–99)
Glucose-Capillary: 149 mg/dL — ABNORMAL HIGH (ref 70–99)
Glucose-Capillary: 252 mg/dL — ABNORMAL HIGH (ref 70–99)

## 2012-02-10 LAB — HEMOGLOBIN A1C: Mean Plasma Glucose: 163 mg/dL — ABNORMAL HIGH (ref <117)

## 2012-02-10 LAB — CBC
MCH: 32.1 pg (ref 26.0–34.0)
MCHC: 34.6 g/dL (ref 30.0–36.0)
RDW: 13.1 % (ref 11.5–15.5)

## 2012-02-10 MED ORDER — GADOBENATE DIMEGLUMINE 529 MG/ML IV SOLN
15.0000 mL | Freq: Once | INTRAVENOUS | Status: AC | PRN
  Administered 2012-02-10: 15 mL via INTRAVENOUS

## 2012-02-10 MED ORDER — MUPIROCIN 2 % EX OINT
1.0000 "application " | TOPICAL_OINTMENT | Freq: Two times a day (BID) | CUTANEOUS | Status: DC
  Administered 2012-02-10 – 2012-02-13 (×6): 1 via NASAL
  Filled 2012-02-10: qty 22

## 2012-02-10 MED ORDER — CHLORHEXIDINE GLUCONATE CLOTH 2 % EX PADS
6.0000 | MEDICATED_PAD | Freq: Every day | CUTANEOUS | Status: DC
  Administered 2012-02-11 – 2012-02-13 (×2): 6 via TOPICAL

## 2012-02-10 NOTE — Progress Notes (Signed)
VASCULAR LAB PRELIMINARY  ARTERIAL  ABI completed:    RIGHT    LEFT    PRESSURE WAVEFORM  PRESSURE WAVEFORM  BRACHIAL 140  T BRACHIAL 146 T  DP   DP    AT 168 T AT 153 T  PT 145 T PT 152 T  PER   PER    GREAT TOE  NA GREAT TOE  NA    RIGHT LEFT  ABI >1.0 >1.0     [name], 02/10/2012, 2:27 PM

## 2012-02-10 NOTE — Progress Notes (Signed)
[facility] PROGRESS NOTE  [name] ZOX:[id number] DOB: 1951-04-28 DOA: 02/09/2012 PCP: Lupita Raider, MD HPI/Subjective: Denies any pain, denies fever chills   Assessment/Plan:  Osteomyelitis: - obtain CRP, ESR, wound culture, blood cultures x 2  - Start broad spectrum antibiotics with vancomycin and cefepime (for gram negative coverage)  - Orthopedics consulted for further recommendations +/- debridement  - place NPO after midnight.  - Depending on evaluation and recommendations from specialist patient may require therapy further therapy with either prolonged IV antibiotic administration or possible surgical intervention which likely will require prolonged hospitalization (past 2 days)  - Will consider ID consult, depending on the clinical course.   DM 2  - continue glimiperide  - hold metformin  - place on SSI and diabetic diet   DDD  - continue home pain regimen per my discussion with patient with demerol   Anemia  - Stable at this juncture  - anemia panel reviewed, patient has history of iron deficiency anemia in the past but iron levels within normal limits.  - Monitor levels   Code Status: Full Family Communication: Spoke to the patient and her husband who is at bedside Disposition Plan: Remains inpatient   Consultants:  Shon Baton of orthopedics  Procedures:  None  Antibiotics:  Vancomycin started 02/09/2012.  Cefepime started 02/09/2012   Objective: Filed Vitals:   02/09/12 1609 02/09/12 1710 02/09/12 2135 02/10/12 0500  BP: 133/52 165/70 168/78 132/68  Pulse: 88 89 102 78  Temp:  99 F (37.2 C) 99.1 F (37.3 C) 99.1 F (37.3 C)  TempSrc:  Oral Oral Oral  Resp: 18 20 18 16   Height:  5\' 3"  (1.6 m)    Weight:  103.4 kg (227 lb 15.3 oz)    SpO2: 98% 97% 95% 94%   No intake or output data in the 24 hours ending 02/10/12 1156 Filed Weights   02/09/12 1128 02/09/12 1710  Weight: 99.791 kg (220 lb) 103.4 kg (227 lb 15.3 oz)     Exam:  General: Alert and awake, oriented x3, not in any acute distress. HEENT: anicteric sclera, pupils reactive to light and accommodation, EOMI CVS: S1-S2 clear, no murmur rubs or gallops Chest: clear to auscultation bilaterally, no wheezing, rales or rhonchi Abdomen: soft nontender, nondistended, normal bowel sounds, no organomegaly Extremities: no cyanosis, clubbing or edema noted bilaterally Neuro: Cranial nerves II-XII intact, no focal neurological deficits  Data Reviewed: Basic Metabolic Panel:  Lab 02/10/12 8119 02/09/12 1340  NA 136 135  K 4.3 4.2  CL 102 101  CO2 24 23  GLUCOSE 177* 136*  BUN 20 22  CREATININE 0.96 0.90  CALCIUM 9.2 9.2  MG -- --  PHOS -- --   Liver Function Tests: No results found for this basename: AST:5,ALT:5,ALKPHOS:5,BILITOT:5,PROT:5,ALBUMIN:5 in the last 168 hours No results found for this basename: LIPASE:5,AMYLASE:5 in the last 168 hours No results found for this basename: AMMONIA:5 in the last 168 hours CBC:  Lab 02/10/12 0530 02/09/12 1340  WBC 6.7 7.0  NEUTROABS -- 4.8  HGB 10.2* 10.5*  HCT 29.5* 30.7*  MCV 92.8 93.6  PLT 181 207   Cardiac Enzymes: No results found for this basename: CKTOTAL:5,CKMB:5,CKMBINDEX:5,TROPONINI:5 in the last 168 hours BNP (last 3 results) No results found for this basename: PROBNP:3 in the last 8760 hours CBG:  Lab 02/10/12 0743 02/09/12 2134 02/09/12 1723  GLUCAP 217* 221* 153*    No results found for this or any previous visit (from the past 240 hour(s)).   Studies:  Dg Toe 2nd Right  02/09/2012  *RADIOLOGY REPORT*  Clinical Data: Wound and infection of the distal right second toe.  RIGTH SECOND TOE  Comparison: Right foot films dated 11/26/2011  Findings: There is evidence of new bony destruction and erosion involving the lateral aspect of the second distal phalanx. Associated surrounding soft tissue swelling present.  Findings are consistent with osteomyelitis.  No soft tissue gas is  identified.  IMPRESSION: New bony destruction involving the second distal phalanx.  Findings are consistent with osteomyelitis.   Original Report Authenticated By: Reola Calkins, M.D.     Scheduled Meds:   . sodium chloride   Intravenous Once  . ceFEPime (MAXIPIME) IV  1 g Intravenous Q8H  . clindamycin (CLEOCIN) IV  600 mg Intravenous Once  . glimepiride  8 mg Oral QHS  . hydrochlorothiazide  25 mg Oral Daily  . influenza  inactive virus vaccine  0.5 mL Intramuscular Tomorrow-1000  . insulin aspart  0-15 Units Subcutaneous TID WC  . lisinopril  20 mg Oral Daily  . LORazepam  1 mg Oral BID  . simvastatin  40 mg Oral QPM  . sodium chloride  3 mL Intravenous Q12H  . vancomycin  2,500 mg Intravenous NOW  . vancomycin  1,000 mg Intravenous Q12H  . venlafaxine XR  150 mg Oral BID  . DISCONTD: lisinopril-hydrochlorothiazide  1 tablet Oral Daily  . DISCONTD: LORazepam  1 mg Oral BID AC  . DISCONTD: vancomycin  1,000 mg Intravenous Q12H   Continuous Infusions:   Active Problems:  Anemia in chronic renal disease  DM (diabetes mellitus)  DDD (degenerative disc disease), lumbosacral  HTN (hypertension)  Osteomyelitis    Time spent: 35 minutes    [facility]  [facility] Pager [phone] If 8PM-8AM, please contact night-coverage at [email or web address], password [email or web address] 02/10/2012, 11:56 AM  LOS: 1 day

## 2012-02-10 NOTE — Consult Note (Signed)
WOC consult Note Reason for Consult: Right 2nd toe Wound type:Suspected infection Pressure Ulcer POA: No Dressing procedure/placement/frequency: Patient not seen today as simultaneous referral has been made to orthopedics and their orders supercede nursing recommendations.  I will support the nursing staff by providing conservative orders for topical care i.e.,  for twice daily NS dressings to the right foot, second digit. I will not follow.  Please re-consult if needed. Thanks, Ladona Mow, MSN, RN, [facility], CWOCN [phone])

## 2012-02-11 ENCOUNTER — Encounter (HOSPITAL_COMMUNITY): Payer: Self-pay | Admitting: *Deleted

## 2012-02-11 ENCOUNTER — Encounter (HOSPITAL_COMMUNITY): Payer: Self-pay | Admitting: Anesthesiology

## 2012-02-11 DIAGNOSIS — I10 Essential (primary) hypertension: Secondary | ICD-10-CM

## 2012-02-11 LAB — BASIC METABOLIC PANEL
BUN: 20 mg/dL (ref 6–23)
CO2: 24 mEq/L (ref 19–32)
GFR calc non Af Amer: 69 mL/min — ABNORMAL LOW (ref >90)
Glucose, Bld: 237 mg/dL — ABNORMAL HIGH (ref 70–99)
Potassium: 4.4 mEq/L (ref 3.5–5.1)
Sodium: 134 mEq/L — ABNORMAL LOW (ref 135–145)

## 2012-02-11 LAB — CBC
Hemoglobin: 10.5 g/dL — ABNORMAL LOW (ref 12.0–15.0)
MCH: 31.9 pg (ref 26.0–34.0)
MCHC: 34.8 g/dL (ref 30.0–36.0)
MCV: 91.8 fL (ref 78.0–100.0)
RBC: 3.29 MIL/uL — ABNORMAL LOW (ref 3.87–5.11)

## 2012-02-11 LAB — GLUCOSE, CAPILLARY
Glucose-Capillary: 224 mg/dL — ABNORMAL HIGH (ref 70–99)
Glucose-Capillary: 273 mg/dL — ABNORMAL HIGH (ref 70–99)

## 2012-02-11 MED ORDER — CHLORHEXIDINE GLUCONATE 4 % EX LIQD
60.0000 mL | Freq: Once | CUTANEOUS | Status: AC
  Administered 2012-02-12: 4 via TOPICAL
  Filled 2012-02-11: qty 60

## 2012-02-11 MED ORDER — SODIUM CHLORIDE 0.9 % IV SOLN
INTRAVENOUS | Status: DC
  Administered 2012-02-12: 01:00:00 via INTRAVENOUS

## 2012-02-11 MED ORDER — INFLUENZA VIRUS VACC SPLIT PF IM SUSP
0.5000 mL | INTRAMUSCULAR | Status: AC
  Administered 2012-02-13: 0.5 mL via INTRAMUSCULAR
  Filled 2012-02-11: qty 0.5

## 2012-02-11 NOTE — Progress Notes (Signed)
Addendum  Patient seen and examined, chart and data base reviewed.  I agree with the above assessment and plan  For full details please see Mrs. Algis Downs PA. Note.  Awaits surgery likely left  2nd toe amputation in AM.  Clint Lipps Pager: [phone] 02/11/2012, 2:13 PM

## 2012-02-11 NOTE — Progress Notes (Signed)
Subjective: 61 y/o female with pmh of diabetes presented with cellulitis of the right foot and worsening of a 69 month old right 2nd toe infection.  MRI shows osteo of the 2nd toe and no abscess.  ABIs show >1 for both feet.  No h/o smoking.  No CAD.  Pt denies any pain in the foot.  Objective: Vital signs in last 24 hours: Temp:  [98.5 F (36.9 C)-99.3 F (37.4 C)] 98.5 F (36.9 C) (10/07 0520) Pulse Rate:  [80-88] 85  (10/07 0520) Resp:  [18-20] 18  (10/07 0520) BP: (113-124)/(55-72) 124/55 mmHg (10/07 0520) SpO2:  [95 %-100 %] 100 % (10/07 0520)  Intake/Output from previous day:   Intake/Output this shift:     Basename 02/10/12 0530 02/09/12 1340  HGB 10.2* 10.5*    Basename 02/10/12 0530 02/09/12 1340  WBC 6.7 7.0  RBC 3.18* 3.28*  HCT 29.5* 30.7*  PLT 181 207    Basename 02/10/12 0530 02/09/12 1340  NA 136 135  K 4.3 4.2  CL 102 101  CO2 24 23  BUN 20 22  CREATININE 0.96 0.90  GLUCOSE 177* 136*  CALCIUM 9.2 9.2   No results found for this basename: LABPT:2,INR:2 in the last 72 hours  wn wd woman in nad.  A and O x 4.  Mood and affect normal.  EOMI.  Respirations unlabored.  R 2nd toe with ulcer distally.  Swolllen distally.  No cellulitis.  2+ dp and pt pulses.  LT diminshed distally.  No lymphadenopathy.  Skin o/w healthy.  Assessment/Plan: L 2nd toe osteomyelitis - to OR for L 2nd toe amputation.  O/w the cellulitis appears to be treated effectively on the current abx regimen.  She understands the plan and agrees.  I'll work on OR time for tomorrow.   Toni Arthurs 02/11/2012, 7:25 AM

## 2012-02-11 NOTE — Progress Notes (Signed)
Patient ID: [name], female   DOB: May 18, 1950, 61 y.o.   MRN: [id number] [facility] PROGRESS NOTE  MANHATTAN MCCUEN GEX:[id number] DOB: 1950/05/19 DOA: 02/09/2012 PCP: Lupita Raider, MD   HPI/Subjective: Feels ok.  No complaints.  Awaiting surgery.  Denies any pain, denies fever chills   Assessment/Plan:  Osteomyelitis: - blood culture negative to date.  Would culture positive for staph aureus.  Awaiting sensitivities. - Start broad spectrum antibiotics with vancomycin and cefepime (for gram negative coverage)  - Orthopedics to perform debridment of toe 02/12/2012.  (Dr. Victorino Dike) - place NPO after midnight.  - Hopefully patient will not require long term antibiotics after surgery and can be discharged on PO medications when medically. stable.  DM 2  - continue glimiperide  - hold metformin until after surgery. - place on SSI and diabetic diet   DDD  - continue home pain regiment with demerol.   Anemia  - Stable at this juncture  - anemia panel reviewed, patient has history of iron deficiency anemia in the past but iron levels within normal limits.  - Monitor levels   Code Status: Full Family Communication: Spoke to the patient and her husband who is at bedside Disposition Plan: Remains inpatient date of d/c will be determined after surgery.   Consultants:  Shon Baton of orthopedics  Procedures: Surgery scheduled 10/8/ 2013  Antibiotics:  Vancomycin started 02/09/2012.  Cefepime started 02/09/2012   Objective: Filed Vitals:   02/10/12 0500 02/10/12 1340 02/10/12 2058 02/11/12 0520  BP: 132/68 120/72 113/67 124/55  Pulse: 78 80 88 85  Temp: 99.1 F (37.3 C) 98.5 F (36.9 C) 99.3 F (37.4 C) 98.5 F (36.9 C)  TempSrc: Oral Oral Oral Oral  Resp: 16 18 20 18   Height:      Weight:      SpO2: 94% 96% 95% 100%   No intake or output data in the 24 hours ending 02/11/12 1213 Filed Weights   02/09/12 1128 02/09/12 1710  Weight: 99.791 kg (220 lb)  103.4 kg (227 lb 15.3 oz)    Exam:  General: Alert and awake, oriented x3, not in any acute distress. Very pleasant. CVS: S1-S2 clear, no murmur rubs or gallops Chest: clear to auscultation bilaterally, no wheezing, rales or rhonchi Abdomen: soft nontender, somewhat obese, nondistended, normal bowel sounds, no organomegaly Extremities: no cyanosis, clubbing or edema noted bilaterally Neuro: Cranial nerves II-XII intact, no focal neurological deficits  Data Reviewed: Basic Metabolic Panel:  Lab 02/11/12 0102 02/10/12 0530 02/09/12 1340  NA 134* 136 135  K 4.4 4.3 4.2  CL 99 102 101  CO2 24 24 23   GLUCOSE 237* 177* 136*  BUN 20 20 22   CREATININE 0.90 0.96 0.90  CALCIUM 9.8 9.2 9.2  MG -- -- --  PHOS -- -- --   CBC:  Lab 02/11/12 0740 02/10/12 0530 02/09/12 1340  WBC 6.4 6.7 7.0  NEUTROABS -- -- 4.8  HGB 10.5* 10.2* 10.5*  HCT 30.2* 29.5* 30.7*  MCV 91.8 92.8 93.6  PLT 181 181 207   CBG:  Lab 02/11/12 1204 02/11/12 0747 02/10/12 2054 02/10/12 1700 02/10/12 1210  GLUCAP 273* 224* 252* 149* 189*    Recent Results (from the past 240 hour(s))  WOUND CULTURE     Status: Normal (Preliminary result)   Collection Time   02/09/12  6:23 PM      Component Value Range Status Comment   Specimen Description WOUND RIGHT TOE 2ND   Final    Special Requests  SECOND   Final    Gram Stain PENDING   Incomplete    Culture     Final    Value: MODERATE STAPHYLOCOCCUS AUREUS     Note: RIFAMPIN AND GENTAMICIN SHOULD NOT BE USED AS SINGLE DRUGS FOR TREATMENT OF STAPH INFECTIONS.   Report Status PENDING   Incomplete   CULTURE, BLOOD (ROUTINE X 2)     Status: Normal (Preliminary result)   Collection Time   02/09/12  6:40 PM      Component Value Range Status Comment   Specimen Description BLOOD LEFT ARM   Final    Special Requests BOTTLES DRAWN AEROBIC AND ANAEROBIC 10 CC   Final    Culture  Setup Time 02/10/2012 11:37   Final    Culture     Final    Value:        BLOOD CULTURE RECEIVED  NO GROWTH TO DATE CULTURE WILL BE HELD FOR 5 DAYS BEFORE ISSUING A FINAL NEGATIVE REPORT   Report Status PENDING   Incomplete   CULTURE, BLOOD (ROUTINE X 2)     Status: Normal (Preliminary result)   Collection Time   02/09/12  6:50 PM      Component Value Range Status Comment   Specimen Description BLOOD RIGHT ARM   Final    Special Requests BOTTLES DRAWN AEROBIC AND ANAEROBIC 10CC   Final    Culture  Setup Time 02/10/2012 11:38   Final    Culture     Final    Value:        BLOOD CULTURE RECEIVED NO GROWTH TO DATE CULTURE WILL BE HELD FOR 5 DAYS BEFORE ISSUING A FINAL NEGATIVE REPORT   Report Status PENDING   Incomplete   SURGICAL PCR SCREEN     Status: Abnormal   Collection Time   02/10/12  4:28 PM      Component Value Range Status Comment   MRSA, PCR POSITIVE (*) NEGATIVE Final    Staphylococcus aureus POSITIVE (*) NEGATIVE Final      Studies: Mr Foot Right W Wo Contrast  02/10/2012  *RADIOLOGY REPORT*  Clinical Data: Open wound on 2nd toe for 1 week.  History of diabetes.  Question osteomyelitis.  MRI OF THE RIGHT FOREFOOT WITHOUT AND WITH CONTRAST  Technique:  Multiplanar, multisequence MR imaging was performed both before and after administration of intravenous contrast.  Contrast: 15mL MULTIHANCE GADOBENATE DIMEGLUMINE 529 MG/ML IV SOLN  Comparison: Radiographs 02/09/2012.  Findings: As demonstrated radiographically, there is near complete destruction of the second distal phalanx consistent with osteomyelitis. There is mild T2 hyperintensity within the second middle phalanx without associated T1 signal abnormality.  The additional phalanges and metatarsals appear normal aside from mild degenerative changes at the first metatarsal phalangeal joint.  The second toe is diffusely swollen with heterogeneous enhancement following contrast.  There is no focal fluid collection or significant joint effusion.  The open wound appears to be along the distal aspect of the second toe.  There is dorsal  subcutaneous edema throughout the forefoot.  T2 hyperintensity and atrophy throughout the forefoot musculature is likely secondary to underlying diabetic myopathy.  IMPRESSION:  1.  As demonstrated radiographically, there is destruction of the second distal phalanx consistent with osteomyelitis. 2.  Mild nonspecific marrow hyperintensity within the second middle phalanx without T1 signal abnormality. 3.  Forefoot cellulitis without evidence of drainable soft tissue abscess.   Original Report Authenticated By: Gerrianne Scale, M.D.    Dg Toe 2nd Right  02/09/2012  *RADIOLOGY REPORT*  Clinical Data: Wound and infection of the distal right second toe.  RIGTH SECOND TOE  Comparison: Right foot films dated 11/26/2011  Findings: There is evidence of new bony destruction and erosion involving the lateral aspect of the second distal phalanx. Associated surrounding soft tissue swelling present.  Findings are consistent with osteomyelitis.  No soft tissue gas is identified.  IMPRESSION: New bony destruction involving the second distal phalanx.  Findings are consistent with osteomyelitis.   Original Report Authenticated By: Reola Calkins, M.D.     Scheduled Meds:    . ceFEPime (MAXIPIME) IV  1 g Intravenous Q8H  . Chlorhexidine Gluconate Cloth  6 each Topical Q0600  . glimepiride  8 mg Oral QHS  . hydrochlorothiazide  25 mg Oral Daily  . influenza  inactive virus vaccine  0.5 mL Intramuscular Tomorrow-1000  . influenza  inactive virus vaccine  0.5 mL Intramuscular Tomorrow-1000  . insulin aspart  0-15 Units Subcutaneous TID WC  . lisinopril  20 mg Oral Daily  . LORazepam  1 mg Oral BID  . mupirocin ointment  1 application Nasal BID  . simvastatin  40 mg Oral QPM  . sodium chloride  3 mL Intravenous Q12H  . vancomycin  1,000 mg Intravenous Q12H  . venlafaxine XR  150 mg Oral BID   Continuous Infusions:   Active Problems:  Anemia in chronic renal disease  DM (diabetes mellitus)  DDD  (degenerative disc disease), lumbosacral  HTN (hypertension)  Osteomyelitis    Time spent: 35 minutes    Stephani Police  [facility] Pager [phone]  02/11/2012, 12:13 PM  LOS: 2 days

## 2012-02-11 NOTE — Progress Notes (Signed)
Inpatient Diabetes Program Recommendations  AACE/ADA: New Consensus Statement on Inpatient Glycemic Control (2013)  Target Ranges:  Prepandial:   less than 140 mg/dL      Peak postprandial:   less than 180 mg/dL (1-2 hours)      Critically ill patients:  140 - 180 mg/dL    Inpatient Diabetes Program Recommendations Insulin - Basal: Add Lantus 10 units  Oral Agents: Change Amaryl time to with Breakfast bc not usually given at HS due to increased risk of hypoglycemia  HgbA1C: = 7.3  Thank you  Piedad Climes [facility] Inpatient Diabetes Coordinator [phone]

## 2012-02-12 ENCOUNTER — Encounter (HOSPITAL_COMMUNITY): Payer: Self-pay | Admitting: Anesthesiology

## 2012-02-12 ENCOUNTER — Encounter (HOSPITAL_COMMUNITY): Payer: Self-pay | Admitting: Certified Registered Nurse Anesthetist

## 2012-02-12 ENCOUNTER — Inpatient Hospital Stay (HOSPITAL_COMMUNITY): Payer: BC Managed Care – PPO | Admitting: Anesthesiology

## 2012-02-12 ENCOUNTER — Encounter (HOSPITAL_COMMUNITY)
Admission: EM | Disposition: A | Discharge status: Home / Self Care | Payer: Self-pay | Source: Home / Self Care | Attending: Internal Medicine

## 2012-02-12 DIAGNOSIS — M869 Osteomyelitis, unspecified: Secondary | ICD-10-CM

## 2012-02-12 HISTORY — PX: AMPUTATION: SHX166

## 2012-02-12 LAB — GLUCOSE, CAPILLARY
Glucose-Capillary: 231 mg/dL — ABNORMAL HIGH (ref 70–99)
Glucose-Capillary: 136 mg/dL — ABNORMAL HIGH (ref 70–99)
Glucose-Capillary: 170 mg/dL — ABNORMAL HIGH (ref 70–99)

## 2012-02-12 LAB — WOUND CULTURE: Gram Stain: NONE SEEN

## 2012-02-12 SURGERY — AMPUTATION, FOOT, RAY
Anesthesia: Monitor Anesthesia Care | Site: Toe | Laterality: Right | Wound class: Clean

## 2012-02-12 MED ORDER — BACITRACIN ZINC 500 UNIT/GM EX OINT
TOPICAL_OINTMENT | CUTANEOUS | Status: AC
  Filled 2012-02-12: qty 15

## 2012-02-12 MED ORDER — GLIMEPIRIDE 4 MG PO TABS
8.0000 mg | ORAL_TABLET | Freq: Every day | ORAL | Status: DC
  Administered 2012-02-13: 8 mg via ORAL
  Filled 2012-02-12 (×3): qty 2

## 2012-02-12 MED ORDER — INSULIN GLARGINE 100 UNIT/ML ~~LOC~~ SOLN
10.0000 [IU] | Freq: Every day | SUBCUTANEOUS | Status: DC
  Administered 2012-02-12: 10 [IU] via SUBCUTANEOUS

## 2012-02-12 MED ORDER — LACTATED RINGERS IV SOLN
INTRAVENOUS | Status: DC | PRN
  Administered 2012-02-12: 13:00:00 via INTRAVENOUS

## 2012-02-12 MED ORDER — PROPOFOL INFUSION 10 MG/ML OPTIME
INTRAVENOUS | Status: DC | PRN
  Administered 2012-02-12: 120 ug/kg/min via INTRAVENOUS

## 2012-02-12 MED ORDER — HYDRALAZINE HCL 20 MG/ML IJ SOLN
10.0000 mg | Freq: Four times a day (QID) | INTRAMUSCULAR | Status: DC | PRN
  Filled 2012-02-12: qty 0.5

## 2012-02-12 MED ORDER — 0.9 % SODIUM CHLORIDE (POUR BTL) OPTIME
TOPICAL | Status: DC | PRN
  Administered 2012-02-12: 1000 mL

## 2012-02-12 MED ORDER — MIDAZOLAM HCL 5 MG/5ML IJ SOLN
INTRAMUSCULAR | Status: DC | PRN
  Administered 2012-02-12: 2 mg via INTRAVENOUS

## 2012-02-12 MED ORDER — PROPOFOL 10 MG/ML IV BOLUS
INTRAVENOUS | Status: DC | PRN
  Administered 2012-02-12: 30 mg via INTRAVENOUS

## 2012-02-12 MED ORDER — LACTATED RINGERS IV SOLN
INTRAVENOUS | Status: DC
  Administered 2012-02-12: 12:00:00 via INTRAVENOUS

## 2012-02-12 MED ORDER — SULFAMETHOXAZOLE-TMP DS 800-160 MG PO TABS
2.0000 | ORAL_TABLET | Freq: Two times a day (BID) | ORAL | Status: DC
  Administered 2012-02-12 – 2012-02-13 (×2): 2 via ORAL
  Filled 2012-02-12 (×5): qty 2

## 2012-02-12 MED ORDER — SODIUM CHLORIDE 0.9 % IV SOLN
INTRAVENOUS | Status: DC
  Administered 2012-02-12: 16:00:00 via INTRAVENOUS

## 2012-02-12 MED ORDER — FENTANYL CITRATE 0.05 MG/ML IJ SOLN
INTRAMUSCULAR | Status: AC
  Filled 2012-02-12: qty 2

## 2012-02-12 MED ORDER — MIDAZOLAM HCL 2 MG/2ML IJ SOLN
1.0000 mg | INTRAMUSCULAR | Status: DC | PRN
  Administered 2012-02-12 (×3): 0.5 mg via INTRAVENOUS

## 2012-02-12 MED ORDER — MIDAZOLAM HCL 2 MG/2ML IJ SOLN
INTRAMUSCULAR | Status: AC
  Filled 2012-02-12: qty 2

## 2012-02-12 SURGICAL SUPPLY — 45 items
BANDAGE ELASTIC 6 VELCRO ST LF (GAUZE/BANDAGES/DRESSINGS) ×1 IMPLANT
BANDAGE GAUZE 4  KLING STR (GAUZE/BANDAGES/DRESSINGS) ×1 IMPLANT
BLADE LONG MED 31X9 (MISCELLANEOUS) ×2 IMPLANT
BNDG CMPR 9X4 STRL LF SNTH (GAUZE/BANDAGES/DRESSINGS)
BNDG COHESIVE 4X5 TAN STRL (GAUZE/BANDAGES/DRESSINGS) ×1 IMPLANT
BNDG COHESIVE 6X5 TAN STRL LF (GAUZE/BANDAGES/DRESSINGS) ×1 IMPLANT
BNDG ESMARK 4X9 LF (GAUZE/BANDAGES/DRESSINGS) ×1 IMPLANT
CHLORAPREP W/TINT 26ML (MISCELLANEOUS) ×2 IMPLANT
CLOTH BEACON ORANGE TIMEOUT ST (SAFETY) ×2 IMPLANT
CUFF TOURNIQUET SINGLE 34IN LL (TOURNIQUET CUFF) IMPLANT
CUFF TOURNIQUET SINGLE 44IN (TOURNIQUET CUFF) IMPLANT
DRAPE U-SHAPE 47X51 STRL (DRAPES) ×3 IMPLANT
DRSG ADAPTIC 3X8 NADH LF (GAUZE/BANDAGES/DRESSINGS) ×1 IMPLANT
DRSG PAD ABDOMINAL 8X10 ST (GAUZE/BANDAGES/DRESSINGS) ×1 IMPLANT
ELECT REM PT RETURN 9FT ADLT (ELECTROSURGICAL) ×2
ELECTRODE REM PT RTRN 9FT ADLT (ELECTROSURGICAL) ×1 IMPLANT
GLOVE BIO SURGEON STRL SZ8 (GLOVE) ×4 IMPLANT
GLOVE BIOGEL PI IND STRL 7.0 (GLOVE) IMPLANT
GLOVE BIOGEL PI IND STRL 8 (GLOVE) ×1 IMPLANT
GLOVE BIOGEL PI INDICATOR 7.0 (GLOVE) ×1
GLOVE BIOGEL PI INDICATOR 8 (GLOVE) ×1
GLOVE ECLIPSE 6.5 STRL STRAW (GLOVE) ×1 IMPLANT
GOWN PREVENTION PLUS XLARGE (GOWN DISPOSABLE) ×2 IMPLANT
GOWN STRL NON-REIN LRG LVL3 (GOWN DISPOSABLE) ×2 IMPLANT
KIT BASIN OR (CUSTOM PROCEDURE TRAY) ×2 IMPLANT
KIT ROOM TURNOVER OR (KITS) ×2 IMPLANT
MANIFOLD NEPTUNE II (INSTRUMENTS) ×2 IMPLANT
NS IRRIG 1000ML POUR BTL (IV SOLUTION) ×2 IMPLANT
PACK ORTHO EXTREMITY (CUSTOM PROCEDURE TRAY) ×2 IMPLANT
PAD ARMBOARD 7.5X6 YLW CONV (MISCELLANEOUS) ×4 IMPLANT
PAD CAST 4YDX4 CTTN HI CHSV (CAST SUPPLIES) ×1 IMPLANT
PADDING CAST ABS 4INX4YD NS (CAST SUPPLIES) ×1
PADDING CAST ABS COTTON 4X4 ST (CAST SUPPLIES) IMPLANT
PADDING CAST COTTON 4X4 STRL (CAST SUPPLIES)
SPONGE GAUZE 4X4 12PLY (GAUZE/BANDAGES/DRESSINGS) ×1 IMPLANT
SPONGE LAP 18X18 X RAY DECT (DISPOSABLE) ×2 IMPLANT
STAPLER VISISTAT 35W (STAPLE) IMPLANT
STOCKINETTE IMPERVIOUS LG (DRAPES) IMPLANT
SUCTION FRAZIER TIP 10 FR DISP (SUCTIONS) ×2 IMPLANT
SUT ETHILON 2 0 PSLX (SUTURE) IMPLANT
TOWEL OR 17X24 6PK STRL BLUE (TOWEL DISPOSABLE) ×2 IMPLANT
TOWEL OR 17X26 10 PK STRL BLUE (TOWEL DISPOSABLE) ×2 IMPLANT
TUBE CONNECTING 12X1/4 (SUCTIONS) ×2 IMPLANT
UNDERPAD 30X30 INCONTINENT (UNDERPADS AND DIAPERS) ×2 IMPLANT
WATER STERILE IRR 1000ML POUR (IV SOLUTION) ×1 IMPLANT

## 2012-02-12 NOTE — Anesthesia Postprocedure Evaluation (Signed)
  Anesthesia Post-op Note  Patient: [name]  Procedure(s) Performed: Procedure(s) (LRB) with comments: AMPUTATION RAY (Right) - RIGHT 2ND TOE AMPUTATION   Patient Location: PACU  Anesthesia Type: MAC  Level of Consciousness: awake  Airway and Oxygen Therapy: Patient Spontanous Breathing  Post-op Pain: mild  Post-op Assessment: Post-op Vital signs reviewed  Post-op Vital Signs: Reviewed  Complications: No apparent anesthesia complications

## 2012-02-12 NOTE — Interval H&P Note (Signed)
History and Physical Interval Note:  02/12/2012 12:24 PM  [name]  has presented today for surgery, with the diagnosis of right 2nd toe osteomylitits   The various methods of treatment have been discussed with the patient and family. After consideration of risks, benefits and other options for treatment, the patient has consented to  Procedure(s) (LRB) with comments: AMPUTATION RAY (Right) - RIGHT 2ND TOE AMPUTATION  as a surgical intervention .  The patient's history has been reviewed, patient examined, no change in status, stable for surgery.  I have reviewed the patient's chart and labs.  Questions were answered to the patient's satisfaction.  The risks and benefits of the alternative treatment options have been discussed in detail.  The patient wishes to proceed with surgery and specifically understands risks of bleeding, infection, nerve damage, blood clots, need for additional surgery, revision amputation and death.     Toni Arthurs

## 2012-02-12 NOTE — Anesthesia Preprocedure Evaluation (Addendum)
Anesthesia Evaluation  Patient identified by MRN, date of birth, ID band Patient awake    Reviewed: Allergy & Precautions, H&P , NPO status , Patient's Chart, lab work & pertinent test results  Airway Mallampati: II      Dental   Pulmonary sleep apnea and Continuous Positive Airway Pressure Ventilation ,          Cardiovascular hypertension, Pt. on medications Rhythm:Regular Rate:Normal     Neuro/Psych    GI/Hepatic negative GI ROS, Neg liver ROS,   Endo/Other  diabetes, Type 2  Renal/GU negative Renal ROS     Musculoskeletal   Abdominal   Peds  Hematology negative hematology ROS (+)   Anesthesia Other Findings   Reproductive/Obstetrics                          Anesthesia Physical Anesthesia Plan  ASA: III  Anesthesia Plan: MAC   Post-op Pain Management:    Induction: Intravenous  Airway Management Planned: Simple Face Mask  Additional Equipment:   Intra-op Plan:   Post-operative Plan:   Informed Consent: I have reviewed the patients History and Physical, chart, labs and discussed the procedure including the risks, benefits and alternatives for the proposed anesthesia with the patient or authorized representative who has indicated his/her understanding and acceptance.   Dental advisory given  Plan Discussed with: CRNA and Anesthesiologist  Anesthesia Plan Comments:        Anesthesia Quick Evaluation

## 2012-02-12 NOTE — Brief Op Note (Signed)
02/09/2012 - 02/12/2012  1:45 PM  PATIENT:  [name]  61 y.o. female  PRE-OPERATIVE DIAGNOSIS:  right 2nd toe osteomylitits   POST-OPERATIVE DIAGNOSIS:  right 2nd toe osteomylitits   Procedure(s): Right 2nd toe amputation through the MP joint  SURGEON:  Toni Arthurs, MD  ASSISTANT: n/a  ANESTHESIA:   MAC, regional  EBL:  minimal   TOURNIQUET:  approx 10 min with ankle esmarch  COMPLICATIONS:  None apparent  DISPOSITION:  Extubated, awake and stable to recovery.  DICTATION ID:  [id number]

## 2012-02-12 NOTE — Progress Notes (Signed)
ANTIBIOTIC CONSULT NOTE   Pharmacy Consult for Vancomycin Indication: Osteomyelitis  Allergies  Allergen Reactions  . Ultram (Tramadol) Rash  . Betadine (Povidone Iodine) Other (See Comments)    Skin peels  . Contrast Media (Iodinated Diagnostic Agents) Nausea And Vomiting  . Fentanyl And Related     Fentanyl patch  . Hydrocodone Other (See Comments)    headache  . Morphine And Related Nausea And Vomiting  . Percocet (Oxycodone-Acetaminophen) Other (See Comments)    Headache   . Sulfa Antibiotics Itching  . Valium Other (See Comments)    headache  . Vicodin (Hydrocodone-Acetaminophen) Other (See Comments)    headache  . Darvocet (Propoxyphene-Acetaminophen) Rash and Other (See Comments)    headache  . Wellbutrin (Bupropion Hcl) Rash    Patient Measurements: Height: 5\' 3"  (160 cm) Weight: 227 lb 15.3 oz (103.4 kg) IBW/kg (Calculated) : 52.4    Vital Signs: Temp: 98.5 F (36.9 C) (10/08 0549) Temp src: Oral (10/08 0549) BP: 164/80 mmHg (10/08 0549) Pulse Rate: 88  (10/08 0549) Intake/Output from previous day:   Intake/Output from this shift:    Labs:  Basename 02/11/12 0740 02/10/12 0530 02/09/12 1340  WBC 6.4 6.7 7.0  HGB 10.5* 10.2* 10.5*  PLT 181 181 207  LABCREA -- -- --  CREATININE 0.90 0.96 0.90   Estimated Creatinine Clearance: 78.3 ml/min (by C-G formula based on Cr of 0.9). No results found for this basename: VANCOTROUGH:2,VANCOPEAK:2,VANCORANDOM:2,GENTTROUGH:2,GENTPEAK:2,GENTRANDOM:2,TOBRATROUGH:2,TOBRAPEAK:2,TOBRARND:2,AMIKACINPEAK:2,AMIKACINTROU:2,AMIKACIN:2, in the last 72 hours   Microbiology: Recent Results (from the past 720 hour(s))  WOUND CULTURE     Status: Normal (Preliminary result)   Collection Time   02/09/12  6:23 PM      Component Value Range Status Comment   Specimen Description WOUND RIGHT TOE 2ND   Final    Special Requests SECOND   Final    Gram Stain PENDING   Incomplete    Culture     Final    Value: MODERATE METHICILLIN  RESISTANT STAPHYLOCOCCUS AUREUS     Note: RIFAMPIN AND GENTAMICIN SHOULD NOT BE USED AS SINGLE DRUGS FOR TREATMENT OF STAPH INFECTIONS. CRITICAL RESULT CALLED TO, READ BACK BY AND VERIFIED WITH: LISA JOHNSON 02/12/12 AT 820 AM BY [facility]   Report Status PENDING   Incomplete    Organism ID, Bacteria METHICILLIN RESISTANT STAPHYLOCOCCUS AUREUS   Final   CULTURE, BLOOD (ROUTINE X 2)     Status: Normal (Preliminary result)   Collection Time   02/09/12  6:40 PM      Component Value Range Status Comment   Specimen Description BLOOD LEFT ARM   Final    Special Requests BOTTLES DRAWN AEROBIC AND ANAEROBIC 10 CC   Final    Culture  Setup Time 02/10/2012 11:37   Final    Culture     Final    Value:        BLOOD CULTURE RECEIVED NO GROWTH TO DATE CULTURE WILL BE HELD FOR 5 DAYS BEFORE ISSUING A FINAL NEGATIVE REPORT   Report Status PENDING   Incomplete   CULTURE, BLOOD (ROUTINE X 2)     Status: Normal (Preliminary result)   Collection Time   02/09/12  6:50 PM      Component Value Range Status Comment   Specimen Description BLOOD RIGHT ARM   Final    Special Requests BOTTLES DRAWN AEROBIC AND ANAEROBIC 10CC   Final    Culture  Setup Time 02/10/2012 11:38   Final    Culture  Final    Value:        BLOOD CULTURE RECEIVED NO GROWTH TO DATE CULTURE WILL BE HELD FOR 5 DAYS BEFORE ISSUING A FINAL NEGATIVE REPORT   Report Status PENDING   Incomplete   SURGICAL PCR SCREEN     Status: Abnormal   Collection Time   02/10/12  4:28 PM      Component Value Range Status Comment   MRSA, PCR POSITIVE (*) NEGATIVE Final    Staphylococcus aureus POSITIVE (*) NEGATIVE Final     Medical History: Past Medical History  Diagnosis Date  . Anemia in chronic renal disease 06/05/2011  . Psoriasis 06/05/2011  . DDD (degenerative disc disease), cervical 06/05/2011  . DDD (degenerative disc disease), lumbosacral 06/05/2011  . HTN (hypertension) 06/05/2011  . Diabetes mellitus   . Sleep apnea     Medications:    Prescriptions prior to admission  Medication Sig Dispense Refill  . albuterol (PROVENTIL HFA;VENTOLIN HFA) 108 (90 BASE) MCG/ACT inhaler Inhale 2 puffs into the lungs every 6 (six) hours as needed. For shortness of breath      . glimepiride (AMARYL) 4 MG tablet Take 8 mg by mouth at bedtime.       Marland Kitchen lisinopril-hydrochlorothiazide (PRINZIDE,ZESTORETIC) 20-25 MG per tablet Take 1 tablet by mouth daily.      Marland Kitchen LORazepam (ATIVAN) 1 MG tablet Take 1 mg by mouth 2 (two) times daily before lunch and supper.      . metFORMIN (GLUCOPHAGE) 1000 MG tablet Take 1,000 mg by mouth 2 (two) times daily with a meal.      . pantoprazole (PROTONIX) 40 MG tablet Take 40 mg by mouth daily as needed. For acid reflux      . simvastatin (ZOCOR) 40 MG tablet Take 40 mg by mouth every evening.      . venlafaxine (EFFEXOR-XR) 150 MG 24 hr capsule Take 150 mg by mouth 2 (two) times daily.       Scheduled:     . chlorhexidine  60 mL Topical Once  . Chlorhexidine Gluconate Cloth  6 each Topical Q0600  . glimepiride  8 mg Oral Q breakfast  . influenza  inactive virus vaccine  0.5 mL Intramuscular Tomorrow-1000  . insulin aspart  0-15 Units Subcutaneous TID WC  . insulin glargine  10 Units Subcutaneous QHS  . lisinopril  20 mg Oral Daily  . LORazepam  1 mg Oral BID  . mupirocin ointment  1 application Nasal BID  . simvastatin  40 mg Oral QPM  . sodium chloride  3 mL Intravenous Q12H  . vancomycin  1,000 mg Intravenous Q12H  . venlafaxine XR  150 mg Oral BID  . DISCONTD: ceFEPime (MAXIPIME) IV  1 g Intravenous Q8H  . DISCONTD: glimepiride  8 mg Oral QHS  . DISCONTD: hydrochlorothiazide  25 mg Oral Daily   Assessment: 61 yo female with PMH of diabetes admitted with osteomyelitis of the right second toe distal phalanx per X-rays. Admitted for IV antibiotics.  Renal function is stable.  MRSA sens to vanc and bactrim.  Unfortunately pt has sulfa allergy.  Goal of Therapy:  Vancomycin trough level 15-20  mcg/ml  Plan:  Cont vanc 1000 mg IV q12h.  F/u Abx plans  Talbert Cage, PharmD Clinical Pharmacist Pager: [phone] 02/12/2012,10:37 AM

## 2012-02-12 NOTE — Anesthesia Procedure Notes (Signed)
Anesthesia Regional Block:  Ankle block  Pre-Anesthetic Checklist: ,, timeout performed, Correct Patient, Correct Site, Correct Laterality, Correct Procedure, Correct Position, site marked, Risks and benefits discussed, Surgical consent,  Pre-op evaluation,  At surgeon's request  Laterality: Right  Prep: Betadine       Needles:       Needle Gauge: 25 and 25 G    Additional Needles: Ankle block Narrative:  Start time: 02/12/2012 12:15 PM End time: 02/12/2012 12:30 PM  Performed by: Personally  Anesthesiologist: Dr. Randa Evens

## 2012-02-12 NOTE — Progress Notes (Signed)
Addendum  Patient seen and examined, chart and data base reviewed.  I agree with the above assessment and plan.  For full details please see Mrs. Algis Downs PA. Note.  Left second toe osteomyelitis, MRSA.   ID to see for Abx recommendations, she is allergic to Sulfa, probably can be D/C on oral antibiotics.   Clint Lipps, MD [facility] Pager: [phone] 02/12/2012, 1:07 PM

## 2012-02-12 NOTE — Consult Note (Signed)
[facility]     Reason for Consult: osteomyelitis    Referring Physician: Dr. Arthor Captain  Active Problems:  Anemia in chronic renal disease  DM (diabetes mellitus)  DDD (degenerative disc disease), lumbosacral  HTN (hypertension)  Osteomyelitis of toe      . chlorhexidine  60 mL Topical Once  . Chlorhexidine Gluconate Cloth  6 each Topical Q0600  . glimepiride  8 mg Oral Q breakfast  . influenza  inactive virus vaccine  0.5 mL Intramuscular Tomorrow-1000  . insulin aspart  0-15 Units Subcutaneous TID WC  . insulin glargine  10 Units Subcutaneous QHS  . lisinopril  20 mg Oral Daily  . LORazepam  1 mg Oral BID  . mupirocin ointment  1 application Nasal BID  . simvastatin  40 mg Oral QPM  . sodium chloride  3 mL Intravenous Q12H  . sulfamethoxazole-trimethoprim  2 tablet Oral Q12H  . venlafaxine XR  150 mg Oral BID  . DISCONTD: ceFEPime (MAXIPIME) IV  1 g Intravenous Q8H  . DISCONTD: glimepiride  8 mg Oral QHS  . DISCONTD: hydrochlorothiazide  25 mg Oral Daily  . DISCONTD: vancomycin  1,000 mg Intravenous Q12H    Recommendations: Bactrim, add Rifampin if tolerated.  I would like to do a few days of antibiotics if possible.   Assessment: Osteomyelitis of toe.  Now s/p amputation.  I will await report from Dr. Victorino Dike to assure gross margins are clear and could go with a short course of antibiotics.  Bactrim is not a true allergy and the patient is willing to try despite itching in the past.  I will add rifampin as well at discharge.  I would avoid Zyvox since she is on an SNRI which can cause serotonin syndrome in rare cases.     Antibiotics: Vancomycin 10/5 - 10/8 Cefepime 10/5 - 10/7  HPI: [name] is a 61 y.o. female with diabetes and recent ulcer on her 2nd toe who presented with localized swelling of the foot.  She had been on an antibiotic recently (thinks it was clindamycin).  She was evaluated and Xray c/w osteomyelitis and admitted  for further management. She had this ongoing for about 4 months and decision was made to proceed with amputation. Vascular studies with ABI > 1 and so proceeded with toe amputation.  No fever, no chills.  Amputation today.  Wound culture swab with MRSA, tetracycline and clindamycin resistant.     Review of Systems: Pertinent items are noted in HPI.  Past Medical History  Diagnosis Date  . Anemia in chronic renal disease 06/05/2011  . Psoriasis 06/05/2011  . DDD (degenerative disc disease), cervical 06/05/2011  . DDD (degenerative disc disease), lumbosacral 06/05/2011  . HTN (hypertension) 06/05/2011  . Diabetes mellitus   . Sleep apnea     History  Substance Use Topics  . Smoking status: Never Smoker   . Smokeless tobacco: Not on file  . Alcohol Use: No    History reviewed. No pertinent family history. Allergies  Allergen Reactions  . Ultram (Tramadol) Rash  . Betadine (Povidone Iodine) Other (See Comments)    Skin peels  . Contrast Media (Iodinated Diagnostic Agents) Nausea And Vomiting  . Fentanyl And Related     Fentanyl patch  . Hydrocodone Other (See Comments)    headache  . Morphine And Related Nausea And Vomiting  . Percocet (Oxycodone-Acetaminophen) Other (See Comments)    Headache   . Sulfa Antibiotics Itching  .  Valium Other (See Comments)    headache  . Vicodin (Hydrocodone-Acetaminophen) Other (See Comments)    headache  . Darvocet (Propoxyphene-Acetaminophen) Rash and Other (See Comments)    headache  . Wellbutrin (Bupropion Hcl) Rash    OBJECTIVE: Blood pressure 141/78, pulse 85, temperature 98.3 F (36.8 C), temperature source Oral, resp. rate 18, height 5\' 3"  (1.6 m), weight 227 lb 15.3 oz (103.4 kg), SpO2 99.00%. General: AAO x 3, nad Skin: no rashes Lungs: CTA B Cor: RRR Abdomen: Soft, ntnd, + bs   Microbiology: Recent Results (from the past 240 hour(s))  WOUND CULTURE     Status: Normal   Collection Time   02/09/12  6:23 PM      Component  Value Range Status Comment   Specimen Description WOUND RIGHT TOE 2ND   Final    Special Requests SECOND   Final    Gram Stain     Final    Value: NO WBC SEEN     NO SQUAMOUS EPITHELIAL CELLS SEEN     RARE GRAM POSITIVE COCCI IN CLUSTERS     IN PAIRS   Culture     Final    Value: MODERATE METHICILLIN RESISTANT STAPHYLOCOCCUS AUREUS     Note: RIFAMPIN AND GENTAMICIN SHOULD NOT BE USED AS SINGLE DRUGS FOR TREATMENT OF STAPH INFECTIONS. CRITICAL RESULT CALLED TO, READ BACK BY AND VERIFIED WITH: LISA JOHNSON 02/12/12 AT 820 AM BY [facility]   Report Status 02/12/2012 FINAL   Final    Organism ID, Bacteria METHICILLIN RESISTANT STAPHYLOCOCCUS AUREUS   Final   CULTURE, BLOOD (ROUTINE X 2)     Status: Normal (Preliminary result)   Collection Time   02/09/12  6:40 PM      Component Value Range Status Comment   Specimen Description BLOOD LEFT ARM   Final    Special Requests BOTTLES DRAWN AEROBIC AND ANAEROBIC 10 CC   Final    Culture  Setup Time 02/10/2012 11:37   Final    Culture     Final    Value:        BLOOD CULTURE RECEIVED NO GROWTH TO DATE CULTURE WILL BE HELD FOR 5 DAYS BEFORE ISSUING A FINAL NEGATIVE REPORT   Report Status PENDING   Incomplete   CULTURE, BLOOD (ROUTINE X 2)     Status: Normal (Preliminary result)   Collection Time   02/09/12  6:50 PM      Component Value Range Status Comment   Specimen Description BLOOD RIGHT ARM   Final    Special Requests BOTTLES DRAWN AEROBIC AND ANAEROBIC 10CC   Final    Culture  Setup Time 02/10/2012 11:38   Final    Culture     Final    Value:        BLOOD CULTURE RECEIVED NO GROWTH TO DATE CULTURE WILL BE HELD FOR 5 DAYS BEFORE ISSUING A FINAL NEGATIVE REPORT   Report Status PENDING   Incomplete   SURGICAL PCR SCREEN     Status: Abnormal   Collection Time   02/10/12  4:28 PM      Component Value Range Status Comment   MRSA, PCR POSITIVE (*) NEGATIVE Final    Staphylococcus aureus POSITIVE (*) NEGATIVE Final     Staci Righter, MD [facility] [phone] pager  [phone] cell 02/12/2012, 5:26 PM

## 2012-02-12 NOTE — Transfer of Care (Cosign Needed)
Immediate Anesthesia Transfer of Care Note  Patient: [name]  Procedure(s) Performed: Procedure(s) (LRB) with comments: AMPUTATION RAY (Right) - RIGHT 2ND TOE AMPUTATION   Patient Location: PACU  Anesthesia Type: MAC  Level of Consciousness: awake, alert  and oriented  Airway & Oxygen Therapy: Patient Spontanous Breathing and Patient connected to nasal cannula oxygen  Post-op Assessment: Report given to PACU RN, Post -op Vital signs reviewed and stable and Patient moving all extremities  Post vital signs: Reviewed and stable  Complications: No apparent anesthesia complications

## 2012-02-12 NOTE — Progress Notes (Signed)
Patient ID: [name], female   DOB: [birth year], 62 y.o.   MRN: [id number]  [facility] PROGRESS NOTE  [name] QIO:[id number] DOB: May 12, 1950 DOA: 02/09/2012 PCP: Lupita Raider, MD   HPI/Subjective: Had some pain in her toe last night.  Not enough to request pain med.    Assessment/Plan:  Osteomyelitis - blood culture negative to date.  Would culture positive for MRSA. Sensitive to Vanc and TMP. - Will continue vanc IV inpatient.  Will d/c cefepime.   - Orthopedics to perform debridment of toe 02/12/2012 at 12:00. -  patient will likely not require long term antibiotics after surgery and can be discharged on PO medications when medically stable.  Infectious disease has been consulted to determine appropriate antibiotic (if any) and duration therapy.  DM 2  - Per Diabetic Educator's recommendations started Lantus 10 Units qhs and changed glimiperide to AM dosing.  - hold metformin until after surgery - placed on SSI and diabetic diet   DDD  - continue home pain regiment with demerol.   Anemia  - Stable at this juncture  - anemia panel reviewed, patient has history of iron deficiency anemia in the past but iron levels within normal limits.  - Monitor levels  HTN -Home medications held until post surgery. -Currently NPO with Hydralazine IV PRN   Code Status: Full Family Communication: Spoke to the patient and her husband who is at bedside Disposition Plan: Remains inpatient date of d/c will be determined after surgery.   Consultants:  Shon Baton of orthopedics  Infectious disease  Procedures: Surgery scheduled 10/8/ 2013  Antibiotics:  Vancomycin started 02/09/2012.  Cefepime started 02/09/2012 d/c'd 02/12/2012   Objective: Filed Vitals:   02/11/12 0520 02/11/12 1400 02/11/12 2019 02/12/12 0549  BP: 124/55 113/71 134/75 164/80  Pulse: 85 88 85 88  Temp: 98.5 F (36.9 C) 98.9 F (37.2 C) 99.2 F (37.3 C) 98.5 F (36.9 C)  TempSrc: Oral   Oral Oral  Resp: 18 16 18 18   Height:      Weight:      SpO2: 100% 96% 96% 95%   No intake or output data in the 24 hours ending 02/12/12 0836 Filed Weights   02/09/12 1128 02/09/12 1710  Weight: 99.791 kg (220 lb) 103.4 kg (227 lb 15.3 oz)    Exam:  General: Alert and awake, oriented x3, not in any acute distress. Very pleasant. CVS: S1-S2 clear, no murmur rubs or gallops Chest: clear to auscultation bilaterally, no wheezing, rales or rhonchi Abdomen: soft nontender, somewhat obese, nondistended, normal bowel sounds, no organomegaly Extremities: no cyanosis, clubbing or edema noted bilaterally Neuro: Cranial nerves II-XII intact, no focal neurological deficits  Data Reviewed: Basic Metabolic Panel:  Lab 02/11/12 3244 02/10/12 0530 02/09/12 1340  NA 134* 136 135  K 4.4 4.3 4.2  CL 99 102 101  CO2 24 24 23   GLUCOSE 237* 177* 136*  BUN 20 20 22   CREATININE 0.90 0.96 0.90  CALCIUM 9.8 9.2 9.2  MG -- -- --  PHOS -- -- --   CBC:  Lab 02/11/12 0740 02/10/12 0530 02/09/12 1340  WBC 6.4 6.7 7.0  NEUTROABS -- -- 4.8  HGB 10.5* 10.2* 10.5*  HCT 30.2* 29.5* 30.7*  MCV 91.8 92.8 93.6  PLT 181 181 207   CBG:  Lab 02/12/12 0751 02/11/12 2212 02/11/12 1654 02/11/12 1204 02/11/12 0747  GLUCAP 231* 302* 261* 273* 224*    Recent Results (from the past 240 hour(s))  WOUND CULTURE  Status: Normal (Preliminary result)   Collection Time   02/09/12  6:23 PM      Component Value Range Status Comment   Specimen Description WOUND RIGHT TOE 2ND   Final    Special Requests SECOND   Final    Gram Stain PENDING   Incomplete    Culture     Final    Value: MODERATE METHICILLIN RESISTANT STAPHYLOCOCCUS AUREUS     Note: RIFAMPIN AND GENTAMICIN SHOULD NOT BE USED AS SINGLE DRUGS FOR TREATMENT OF STAPH INFECTIONS. CRITICAL RESULT CALLED TO, READ BACK BY AND VERIFIED WITH: LISA JOHNSON 02/12/12 AT 820 AM BY [facility]   Report Status PENDING   Incomplete    Organism ID, Bacteria METHICILLIN  RESISTANT STAPHYLOCOCCUS AUREUS   Final   CULTURE, BLOOD (ROUTINE X 2)     Status: Normal (Preliminary result)   Collection Time   02/09/12  6:40 PM      Component Value Range Status Comment   Specimen Description BLOOD LEFT ARM   Final    Special Requests BOTTLES DRAWN AEROBIC AND ANAEROBIC 10 CC   Final    Culture  Setup Time 02/10/2012 11:37   Final    Culture     Final    Value:        BLOOD CULTURE RECEIVED NO GROWTH TO DATE CULTURE WILL BE HELD FOR 5 DAYS BEFORE ISSUING A FINAL NEGATIVE REPORT   Report Status PENDING   Incomplete   CULTURE, BLOOD (ROUTINE X 2)     Status: Normal (Preliminary result)   Collection Time   02/09/12  6:50 PM      Component Value Range Status Comment   Specimen Description BLOOD RIGHT ARM   Final    Special Requests BOTTLES DRAWN AEROBIC AND ANAEROBIC 10CC   Final    Culture  Setup Time 02/10/2012 11:38   Final    Culture     Final    Value:        BLOOD CULTURE RECEIVED NO GROWTH TO DATE CULTURE WILL BE HELD FOR 5 DAYS BEFORE ISSUING A FINAL NEGATIVE REPORT   Report Status PENDING   Incomplete   SURGICAL PCR SCREEN     Status: Abnormal   Collection Time   02/10/12  4:28 PM      Component Value Range Status Comment   MRSA, PCR POSITIVE (*) NEGATIVE Final    Staphylococcus aureus POSITIVE (*) NEGATIVE Final      Studies: Mr Foot Right W Wo Contrast  02/10/2012  *RADIOLOGY REPORT*  Clinical Data: Open wound on 2nd toe for 1 week.  History of diabetes.  Question osteomyelitis.  MRI OF THE RIGHT FOREFOOT WITHOUT AND WITH CONTRAST  Technique:  Multiplanar, multisequence MR imaging was performed both before and after administration of intravenous contrast.  Contrast: 15mL MULTIHANCE GADOBENATE DIMEGLUMINE 529 MG/ML IV SOLN  Comparison: Radiographs 02/09/2012.  Findings: As demonstrated radiographically, there is near complete destruction of the second distal phalanx consistent with osteomyelitis. There is mild T2 hyperintensity within the second middle phalanx  without associated T1 signal abnormality.  The additional phalanges and metatarsals appear normal aside from mild degenerative changes at the first metatarsal phalangeal joint.  The second toe is diffusely swollen with heterogeneous enhancement following contrast.  There is no focal fluid collection or significant joint effusion.  The open wound appears to be along the distal aspect of the second toe.  There is dorsal subcutaneous edema throughout the forefoot.  T2 hyperintensity and atrophy throughout  the forefoot musculature is likely secondary to underlying diabetic myopathy.  IMPRESSION:  1.  As demonstrated radiographically, there is destruction of the second distal phalanx consistent with osteomyelitis. 2.  Mild nonspecific marrow hyperintensity within the second middle phalanx without T1 signal abnormality. 3.  Forefoot cellulitis without evidence of drainable soft tissue abscess.   Original Report Authenticated By: Gerrianne Scale, M.D.     Scheduled Meds:    . ceFEPime (MAXIPIME) IV  1 g Intravenous Q8H  . chlorhexidine  60 mL Topical Once  . Chlorhexidine Gluconate Cloth  6 each Topical Q0600  . glimepiride  8 mg Oral Q breakfast  . influenza  inactive virus vaccine  0.5 mL Intramuscular Tomorrow-1000  . influenza  inactive virus vaccine  0.5 mL Intramuscular Tomorrow-1000  . insulin aspart  0-15 Units Subcutaneous TID WC  . insulin glargine  10 Units Subcutaneous QHS  . lisinopril  20 mg Oral Daily  . LORazepam  1 mg Oral BID  . mupirocin ointment  1 application Nasal BID  . simvastatin  40 mg Oral QPM  . sodium chloride  3 mL Intravenous Q12H  . vancomycin  1,000 mg Intravenous Q12H  . venlafaxine XR  150 mg Oral BID  . DISCONTD: glimepiride  8 mg Oral QHS  . DISCONTD: hydrochlorothiazide  25 mg Oral Daily   Continuous Infusions:    . sodium chloride 100 mL/hr at 02/12/12 0114    Active Problems:  Anemia in chronic renal disease  DM (diabetes mellitus)  DDD  (degenerative disc disease), lumbosacral  HTN (hypertension)  Osteomyelitis of toe    Time spent: 25 minutes    Stephani Police  [facility] Pager [phone]  02/12/2012, 8:36 AM  LOS: 3 days

## 2012-02-13 ENCOUNTER — Encounter (HOSPITAL_COMMUNITY): Payer: Self-pay | Admitting: Orthopedic Surgery

## 2012-02-13 LAB — BASIC METABOLIC PANEL
CO2: 22 mEq/L (ref 19–32)
Calcium: 9.8 mg/dL (ref 8.4–10.5)
Creatinine, Ser: 0.99 mg/dL (ref 0.50–1.10)
GFR calc Af Amer: 71 mL/min — ABNORMAL LOW (ref >90)
Sodium: 134 mEq/L — ABNORMAL LOW (ref 135–145)

## 2012-02-13 LAB — GLUCOSE, CAPILLARY: Glucose-Capillary: 221 mg/dL — ABNORMAL HIGH (ref 70–99)

## 2012-02-13 MED ORDER — MEPERIDINE HCL 50 MG PO TABS: 50.0000 mg | ORAL_TABLET | ORAL | Status: DC | PRN

## 2012-02-13 MED ORDER — RIFAMPIN 150 MG PO CAPS: 150.0000 mg | ORAL_CAPSULE | Freq: Every day | ORAL | Status: DC

## 2012-02-13 MED ORDER — SULFAMETHOXAZOLE-TMP DS 800-160 MG PO TABS: 2.0000 | ORAL_TABLET | Freq: Two times a day (BID) | ORAL | Status: DC

## 2012-02-13 MED ORDER — RIFAMPIN 300 MG PO CAPS: 150.0000 mg | ORAL_CAPSULE | Freq: Two times a day (BID) | ORAL | Status: DC

## 2012-02-13 NOTE — Discharge Summary (Signed)
Physician Discharge Summary  HENDEL PAMPERIN JYN:[id number] DOB: Nov 04, 1950 DOA: 02/09/2012  PCP: Lupita Raider, MD  Admit date: 02/09/2012 Discharge date: 02/13/2012  Recommendations for Outpatient Follow-up:  Follow up with Dr. Cecilie Kicks in 1 - 2 weeks for right foot.  Follow up with Dr. Lupita Raider for Diabetes and Hypertension.    Discharge Diagnoses:  Active Problems:  Anemia in chronic renal disease  DM (diabetes mellitus)  DDD (degenerative disc disease), lumbosacral  HTN (hypertension)  Osteomyelitis of toe    Discharge Condition: Good  Diet recommendation: Carb modified  History of present illness at the time of admission:  ANDREANNA MCDANIEL is a 61 y.o. female with history of diabetes and diabetic neuropathy presented to the ED after noticing some localized swelling at her 2 digit of her right foot.  Denies any recent trauma and states that today is the first day she has noticed it this swollen and red. It is not painful to the touch. Nothing she is aware of makes it better or worse. Problem since first noticed has been persistent. Pt denies any fever, chills, nausea, or emesis. The problem at this juncture is not associated with increased discomfort.  Was evaluated in the ED and found to have osteomyelitis on x ray and we were subsequently consulted for further evaluation and recommendations. While in the ED patient was given clindamycin and found to be afebrile with a normal WBC of 7.0.   Hospital Course:   Osteomyelitis of the Right Second Metarsal. The osteo was identified on xray at the time of admission.  Orthopedic surgery was consulted. Dr. Victorino Dike removed the toe on 10.8.2013.  Blood cultures are still negative to date. Tissue cultures obtained during surgery are pending.  Wound culture is positive for MRSA. Sensitive to Vanc and TMP.  She was treated with Vancomycin IV inpatient for three days and changed to Bactrim on 02/12/12.  At the direction of Dr. Luciana Axe (Infectious  Disease), she will receive Rifampin and Bactrim for 7 days post discharge.  Per Dr. Luciana Axe, if the patient has a reaction to these medications (itching or diarrhea) stop the antibiotics.  She will follow up with Dr. Victorino Dike in 2 weeks.  DM 2  Hgb A1c 7.3 on 10/6.  She was maintained on lantus and ssi as an inpatient.  She will return to oral metformin and amaryl at discharge.  DDD  Continue home pain regimen with demerol.   Anemia  Stable at this juncture.  Anemia panel reviewed, patient has history of iron deficiency anemia in the past but iron levels within normal limits.    HTN  Controlled.  Maintained on IV hydralazine inpatient.  She will be discharged on her previous home medications.     Procedures:  Right second toe amputation though the metatarsophalangeal joint.  Consultations:  Dr. Toni Arthurs, Orthopedic Surgeon  Dr. Merceda Elks, Infectious Disease.  Discharge Exam: Filed Vitals:   02/13/12 0443  BP: 138/51  Pulse: 92  Temp: 98.6 F (37 C)  Resp: 18   Filed Vitals:   02/12/12 1415 02/12/12 1432 02/12/12 2146 02/13/12 0443  BP:  141/78 141/58 138/51  Pulse: 90 85 83 92  Temp: 97 F (36.1 C) 98.3 F (36.8 C) 98.3 F (36.8 C) 98.6 F (37 C)  TempSrc:  Oral Oral Oral  Resp: 16 18 18 18   Height:      Weight:      SpO2: 100% 99% 99% 100%   General: A&O, NAD, Requesting discharge to home  ASAP. Cardiovascular: RRR, No M/R/G Respiratory: CTA, No w/c/r Right Lower Extremity.  Wrapped after surgery, bandage clean and dry.  Discharge Instructions      Discharge Orders    Future Appointments: Provider: Department: Dept Phone: Center:   03/03/2012 8:00 AM Rachael Fee [facility] [phone] None   03/03/2012 8:30 AM Josph Macho, MD [facility] [phone] None   03/03/2012 9:15 AM [facility] [phone] None     Future Orders Please Complete By Expires   Diet - low sodium heart healthy      Increase activity  slowly          Medication List     As of 02/13/2012  8:32 AM    TAKE these medications         albuterol 108 (90 BASE) MCG/ACT inhaler   Commonly known as: PROVENTIL HFA;VENTOLIN HFA   Inhale 2 puffs into the lungs every 6 (six) hours as needed. For shortness of breath      glimepiride 4 MG tablet   Commonly known as: AMARYL   Take 8 mg by mouth at bedtime.      lisinopril-hydrochlorothiazide 20-25 MG per tablet   Commonly known as: PRINZIDE,ZESTORETIC   Take 1 tablet by mouth daily.      LORazepam 1 MG tablet   Commonly known as: ATIVAN   Take 1 mg by mouth 2 (two) times daily before lunch and supper.      meperidine 50 MG tablet   Commonly known as: DEMEROL   Take 1 tablet (50 mg total) by mouth every 4 (four) hours as needed for pain.      metFORMIN 1000 MG tablet   Commonly known as: GLUCOPHAGE   Take 1,000 mg by mouth 2 (two) times daily with a meal.      pantoprazole 40 MG tablet   Commonly known as: PROTONIX   Take 40 mg by mouth daily as needed. For acid reflux      rifampin 300 MG capsule   Commonly known as: RIFADIN   Take 1 capsule (300 mg total) by mouth 2 (two) times daily.      simvastatin 40 MG tablet   Commonly known as: ZOCOR   Take 40 mg by mouth every evening.      sulfamethoxazole-trimethoprim 800-160 MG per tablet   Commonly known as: BACTRIM DS   Take 2 tablets by mouth every 12 (twelve) hours.      venlafaxine XR 150 MG 24 hr capsule   Commonly known as: EFFEXOR-XR   Take 150 mg by mouth 2 (two) times daily.        Follow-up Information    Follow up with HEWITT, Jonny Ruiz, MD. Schedule an appointment as soon as possible for a visit in 1 week.   Contact information:   [address], [address] [place] [address] [phone]       Follow up with Lupita Raider, MD. Schedule an appointment as soon as possible for a visit in 1 week.   Contact information:   [address] [place] [address] [phone]            The results of significant diagnostics from this hospitalization (including imaging, microbiology, ancillary and laboratory) are listed below for reference.    Significant Diagnostic Studies: Mr Foot Right W Wo Contrast  02/10/2012  *RADIOLOGY REPORT*  Clinical Data: Open wound on 2nd toe for 1 week.  History of diabetes.  Question osteomyelitis.  MRI OF THE RIGHT FOREFOOT WITHOUT AND WITH CONTRAST  Technique:  Multiplanar, multisequence MR imaging was performed both before and after administration of intravenous contrast.  Contrast: 15mL MULTIHANCE GADOBENATE DIMEGLUMINE 529 MG/ML IV SOLN  Comparison: Radiographs 02/09/2012.  Findings: As demonstrated radiographically, there is near complete destruction of the second distal phalanx consistent with osteomyelitis. There is mild T2 hyperintensity within the second middle phalanx without associated T1 signal abnormality.  The additional phalanges and metatarsals appear normal aside from mild degenerative changes at the first metatarsal phalangeal joint.  The second toe is diffusely swollen with heterogeneous enhancement following contrast.  There is no focal fluid collection or significant joint effusion.  The open wound appears to be along the distal aspect of the second toe.  There is dorsal subcutaneous edema throughout the forefoot.  T2 hyperintensity and atrophy throughout the forefoot musculature is likely secondary to underlying diabetic myopathy.  IMPRESSION:  1.  As demonstrated radiographically, there is destruction of the second distal phalanx consistent with osteomyelitis. 2.  Mild nonspecific marrow hyperintensity within the second middle phalanx without T1 signal abnormality. 3.  Forefoot cellulitis without evidence of drainable soft tissue abscess.   Original Report Authenticated By: Gerrianne Scale, M.D.    Dg Toe 2nd Right  02/09/2012  *RADIOLOGY REPORT*  Clinical Data: Wound and infection of the distal right second toe.  RIGTH SECOND  TOE  Comparison: Right foot films dated 11/26/2011  Findings: There is evidence of new bony destruction and erosion involving the lateral aspect of the second distal phalanx. Associated surrounding soft tissue swelling present.  Findings are consistent with osteomyelitis.  No soft tissue gas is identified.  IMPRESSION: New bony destruction involving the second distal phalanx.  Findings are consistent with osteomyelitis.   Original Report Authenticated By: Reola Calkins, M.D.     Microbiology: Recent Results (from the past 240 hour(s))  WOUND CULTURE     Status: Normal   Collection Time   02/09/12  6:23 PM      Component Value Range Status Comment   Specimen Description WOUND RIGHT TOE 2ND   Final    Special Requests SECOND   Final    Gram Stain     Final    Value: NO WBC SEEN     NO SQUAMOUS EPITHELIAL CELLS SEEN     RARE GRAM POSITIVE COCCI IN CLUSTERS     IN PAIRS   Culture     Final    Value: MODERATE METHICILLIN RESISTANT STAPHYLOCOCCUS AUREUS     Note: RIFAMPIN AND GENTAMICIN SHOULD NOT BE USED AS SINGLE DRUGS FOR TREATMENT OF STAPH INFECTIONS. CRITICAL RESULT CALLED TO, READ BACK BY AND VERIFIED WITH: LISA JOHNSON 02/12/12 AT 820 AM BY [facility]   Report Status 02/12/2012 FINAL   Final    Organism ID, Bacteria METHICILLIN RESISTANT STAPHYLOCOCCUS AUREUS   Final   CULTURE, BLOOD (ROUTINE X 2)     Status: Normal (Preliminary result)   Collection Time   02/09/12  6:40 PM      Component Value Range Status Comment   Specimen Description BLOOD LEFT ARM   Final    Special Requests BOTTLES DRAWN AEROBIC AND ANAEROBIC 10 CC   Final    Culture  Setup Time 02/10/2012 11:37   Final    Culture     Final    Value:        BLOOD CULTURE RECEIVED NO GROWTH TO DATE CULTURE WILL BE HELD FOR 5 DAYS BEFORE ISSUING A  FINAL NEGATIVE REPORT   Report Status PENDING   Incomplete   CULTURE, BLOOD (ROUTINE X 2)     Status: Normal (Preliminary result)   Collection Time   02/09/12  6:50 PM      Component  Value Range Status Comment   Specimen Description BLOOD RIGHT ARM   Final    Special Requests BOTTLES DRAWN AEROBIC AND ANAEROBIC 10CC   Final    Culture  Setup Time 02/10/2012 11:38   Final    Culture     Final    Value:        BLOOD CULTURE RECEIVED NO GROWTH TO DATE CULTURE WILL BE HELD FOR 5 DAYS BEFORE ISSUING A FINAL NEGATIVE REPORT   Report Status PENDING   Incomplete   SURGICAL PCR SCREEN     Status: Abnormal   Collection Time   02/10/12  4:28 PM      Component Value Range Status Comment   MRSA, PCR POSITIVE (*) NEGATIVE Final    Staphylococcus aureus POSITIVE (*) NEGATIVE Final   TISSUE CULTURE     Status: Normal (Preliminary result)   Collection Time   02/12/12  1:39 PM      Component Value Range Status Comment   Specimen Description TISSUE RIGHT TOE BONE   Final    Special Requests RIGHT 2ND PT ON VANCOMYCIN,CEFEPIME   Final    Gram Stain PENDING   Incomplete    Culture NO GROWTH 1 DAY   Final    Report Status PENDING   Incomplete      Labs: Basic Metabolic Panel:  Lab 02/13/12 1610 02/11/12 0740 02/10/12 0530 02/09/12 1340  NA 134* 134* 136 135  K 4.6 4.4 4.3 4.2  CL 100 99 102 101  CO2 22 24 24 23   GLUCOSE 218* 237* 177* 136*  BUN 19 20 20 22   CREATININE 0.99 0.90 0.96 0.90  CALCIUM 9.8 9.8 9.2 9.2  MG -- -- -- --  PHOS -- -- -- --   CBC:  Lab 02/11/12 0740 02/10/12 0530 02/09/12 1340  WBC 6.4 6.7 7.0  NEUTROABS -- -- 4.8  HGB 10.5* 10.2* 10.5*  HCT 30.2* 29.5* 30.7*  MCV 91.8 92.8 93.6  PLT 181 181 207   CBG:  Lab 02/13/12 0822 02/12/12 2152 02/12/12 1701 02/12/12 1400 02/12/12 0751  GLUCAP 199* 221* 170* 136* 231*    Time coordinating discharge: 40 min.  Signed:  Algis Downs, PA-C [facility] Pager: [phone]   02/13/2012, 8:32 AM  Attending Patient seen and examined. Agree with the assessment and plan as outlined above, patient seems to be doing well post-op-has been seen by both Dr's Hewitt and Comer-will be dishcarged today  on the above mentioned antibiotics. She has been instructed by me to follow up with Dr Victorino Dike in 1-2 weeks and also with her PCP as well.  Dr Windell Norfolk

## 2012-02-13 NOTE — Op Note (Signed)
[name], BROHL NO.:  [id number]  MEDICAL RECORD NO.:  [id number]  LOCATION:  5530                         FACILITY:  [facility]  PHYSICIAN:  Toni Arthurs, MD        DATE OF BIRTH:  February 17, 1951  DATE OF PROCEDURE:  02/12/2012 DATE OF DISCHARGE:                              OPERATIVE REPORT   PREOPERATIVE DIAGNOSIS:  Right second toe osteomyelitis.  POSTOPERATIVE DIAGNOSIS:  Right second toe osteomyelitis.  PROCEDURE:  Right second toe amputation through the metatarsophalangeal joint.  SURGEON:  Toni Arthurs, MD  ANESTHESIA:  MAC, regional.  ESTIMATED BLOOD LOSS:  Minimal.  TOURNIQUET TIME:  Approximately 10 minutes with an ankle Esmarch.  COMPLICATIONS:  None apparent.  DISPOSITION:  Extubated, awake and stable to recovery.  INDICATION FOR PROCEDURE:  The patient is a 61 year old female with a past medical history significant for type 2 diabetes.  She has a history of an ulcer at the right second toe for about 10 months.  She was admitted for cellulitis.  X-rays and MRI reveal osteomyelitis of the second toe.  There was no abscess formation evident.  She presents now for amputation of the second toe.  She understands the risks, benefits, the alternative treatment options and elects surgical treatment.  She specifically understands the risks of bleeding, infection, nerve damage, blood clots, need for additional surgery, revision amputation, and death.  PROCEDURE IN DETAIL:  After preoperative consent was obtained and the correct operative site was identified, the patient was brought to the operating room and placed supine on the operating table.  IV sedation was administered, regional anesthesia had previously been administered. A surgical time-out was taken.  The patient was already on therapeutic antibiotics.  A plantar flap incision was marked on the skin.  The extremity was exsanguinated and a 4-inch Esmarch tourniquet wrap was wrapped around the  ankle.  The previously marked incision was made, and sharp dissection was carried down through the skin and subcutaneous tissue to the level of the bone.  Subperiosteal dissection was carried down to the level of the MP joint.  The toe was disarticulated through the MP joint.  The tendons were trimmed.  Neurovascular bundles were cauterized.  Wound was irrigated copiously.  The flap incision was repaired with 2-0 nylon horizontal mattress sutures.  Sterile dressings were applied followed by compression wrap.  Tourniquet was released at approximately 10 minutes.  Attention was then turned to the toe on the back table where a specimen of the distal phalanx was obtained with a rongeur and sent as a specimen to Microbiology for aerobic and anaerobic culture.  The patient was then awakened from anesthesia and transported to the recovery room in stable condition.  FOLLOWUP PLAN:  The patient will be weightbearing as tolerated on her right foot in a hard-sole shoe.  She will follow up with me in 2 weeks for suture removal.  Antibiotics will be administered per her primary team.     Toni Arthurs, MD     JH/MEDQ  D:  02/12/2012  T:  02/13/2012  Job:  [id number]

## 2012-02-13 NOTE — Progress Notes (Signed)
Discharge instructions reviewed with patient and husband.  Prescriptions given to patient . Skin WNL except ortho shoe and compression wrap dsg to right foot . Left floor via volunteer and husband.

## 2012-02-13 NOTE — Care Management Note (Signed)
    Page 1 of 1   02/13/2012     10:17:57 AM   CARE MANAGEMENT NOTE 02/13/2012  Patient:  [name], [name]   Account Number:  [id number]  Date Initiated:  02/13/2012  Documentation initiated by:  Letha Cape  Subjective/Objective Assessment:   dx ostiomyelitis  admit- lives with spouse, pta independent.     Action/Plan:   Anticipated DC Date:  02/13/2012   Anticipated DC Plan:  HOME W HOME HEALTH SERVICES      DC Planning Services  CM consult      [facility] Choice  HOME HEALTH   Choice offered to / List presented to:  C-1 Patient        HH arranged  HH-1 RN      [facility] agency  [facility]   Status of service:  Completed, signed off Medicare Important Message given?   (If response is "NO", the following Medicare IM given date fields will be blank) Date Medicare IM given:   Date Additional Medicare IM given:    Discharge Disposition:  HOME W HOME HEALTH SERVICES  Per UR Regulation:  Reviewed for med. necessity/level of care/duration of stay  If discussed at Long Length of Stay Meetings, dates discussed:    Comments:  02/13/12 10:15 Letha Cape RN, BSN [phone] patient lives with spouse, she states she has a rolling walker at home she can use.  Patient has medication coverage and transportation. Patient will need wound care, she chose [facility] with [facility] from agency list.  Referral made to to [facility], Marie notified.  Soc will begin 24-48 hrs post discharge. Patient is for dc today.

## 2012-02-13 NOTE — Progress Notes (Signed)
Subjective: 1 Day Post-Op Procedure(s) (LRB): AMPUTATION RAY (Right) Patient reports pain as mild.  Well controlled with oral pain meds.  Objective: Vital signs in last 24 hours: Temp:  [97 F (36.1 C)-98.6 F (37 C)] 98.6 F (37 C) (10/09 0443) Pulse Rate:  [83-92] 92  (10/09 0443) Resp:  [12-19] 18  (10/09 0443) BP: (103-141)/(51-78) 138/51 mmHg (10/09 0443) SpO2:  [99 %-100 %] 100 % (10/09 0443)  Intake/Output from previous day: 10/08 0701 - 10/09 0700 In: 1400 [P.O.:100; I.V.:1300] Out: -  Intake/Output this shift:     Basename 02/11/12 0740  HGB 10.5*    Basename 02/11/12 0740  WBC 6.4  RBC 3.29*  HCT 30.2*  PLT 181    Basename 02/13/12 0545 02/11/12 0740  NA 134* 134*  K 4.6 4.4  CL 100 99  CO2 22 24  BUN 19 20  CREATININE 0.99 0.90  GLUCOSE 218* 237*  CALCIUM 9.8 9.8   No results found for this basename: LABPT:2,INR:2 in the last 72 hours  woiund dressed and dry.  Assessment/Plan: 1 Day Post-Op Procedure(s) (LRB): AMPUTATION RAY (Right) Discharge home with home health F/u with me in a week.  Toni Arthurs 02/13/2012, 7:43 AM

## 2012-02-15 LAB — TISSUE CULTURE
Culture: NO GROWTH
Gram Stain: NONE SEEN

## 2012-02-16 LAB — CULTURE, BLOOD (ROUTINE X 2): Culture: NO GROWTH

## 2012-02-17 LAB — ANAEROBIC CULTURE

## 2012-02-25 ENCOUNTER — Telehealth: Payer: Self-pay | Admitting: Hematology & Oncology

## 2012-02-25 NOTE — Telephone Encounter (Addendum)
Message copied by Cathi Roan on Mon Feb 25, 2012  9:36 AM ------      Message from: [place], [place] N      Created: Fri Jan 25, 2012 10:19 AM                   ----- Message -----         From: Josph Macho, MD         Sent: 01/25/2012   7:27 AM           To: Onc Nurse Hp            Call and let her that her iron levels are still holding steady. Thanks. Cindee Lame  02-25-12  9:37am   Called and spoke to patients at home regarding above MD note, Pt verbalized understanding. Lupita Raider LPN

## 2012-03-03 ENCOUNTER — Ambulatory Visit: Payer: BC Managed Care – PPO

## 2012-03-03 ENCOUNTER — Ambulatory Visit (HOSPITAL_BASED_OUTPATIENT_CLINIC_OR_DEPARTMENT_OTHER): Payer: BC Managed Care – PPO | Admitting: Hematology & Oncology

## 2012-03-03 ENCOUNTER — Other Ambulatory Visit (HOSPITAL_BASED_OUTPATIENT_CLINIC_OR_DEPARTMENT_OTHER): Payer: BC Managed Care – PPO | Admitting: Lab

## 2012-03-03 VITALS — BP 116/59 | HR 71 | Temp 98.0°F | Resp 16 | Ht 63.0 in | Wt 224.0 lb

## 2012-03-03 DIAGNOSIS — D649 Anemia, unspecified: Secondary | ICD-10-CM

## 2012-03-03 DIAGNOSIS — D631 Anemia in chronic kidney disease: Secondary | ICD-10-CM

## 2012-03-03 DIAGNOSIS — D509 Iron deficiency anemia, unspecified: Secondary | ICD-10-CM

## 2012-03-03 DIAGNOSIS — E119 Type 2 diabetes mellitus without complications: Secondary | ICD-10-CM

## 2012-03-03 DIAGNOSIS — N189 Chronic kidney disease, unspecified: Secondary | ICD-10-CM

## 2012-03-03 LAB — CBC WITH DIFFERENTIAL (CANCER CENTER ONLY)
BASO#: 0 10*3/uL (ref 0.0–0.2)
BASO%: 0.1 % (ref 0.0–2.0)
Eosinophils Absolute: 0 10*3/uL (ref 0.0–0.5)
HCT: 30.9 % — ABNORMAL LOW (ref 34.8–46.6)
HGB: 10.2 g/dL — ABNORMAL LOW (ref 11.6–15.9)
LYMPH%: 25.7 % (ref 14.0–48.0)
MCV: 98 fL (ref 81–101)
MONO#: 0.8 10*3/uL (ref 0.1–0.9)
NEUT%: 66.4 % (ref 39.6–80.0)
RDW: 13.2 % (ref 11.1–15.7)
WBC: 10.5 10*3/uL — ABNORMAL HIGH (ref 3.9–10.0)

## 2012-03-03 LAB — IRON AND TIBC
%SAT: 30 % (ref 20–55)
TIBC: 312 ug/dL (ref 250–470)
UIBC: 217 ug/dL (ref 125–400)

## 2012-03-03 LAB — COMPREHENSIVE METABOLIC PANEL
ALT: 24 U/L (ref 0–35)
AST: 16 U/L (ref 0–37)
Albumin: 4 g/dL (ref 3.5–5.2)
Alkaline Phosphatase: 55 U/L (ref 39–117)
Potassium: 4.2 mEq/L (ref 3.5–5.3)
Sodium: 139 mEq/L (ref 135–145)
Total Bilirubin: 0.3 mg/dL (ref 0.3–1.2)
Total Protein: 6.1 g/dL (ref 6.0–8.3)

## 2012-03-03 LAB — FERRITIN: Ferritin: 248 ng/mL (ref 10–291)

## 2012-03-03 NOTE — Progress Notes (Signed)
This office note has been dictated.

## 2012-03-04 NOTE — Progress Notes (Signed)
CC:   [name], M.D. Sherri Arthurs, MD [name], M.D.  DIAGNOSES: 1. Anemia secondary to renal insufficiency. 2. Intermittent iron-deficiency anemia. 3. Recent 2nd toe amputation of her right foot secondary to     cellulitis/diabetes.  CURRENT THERAPY: 1. Aranesp 300 mcg subcu as needed for hemoglobin less than 11. 2. IV iron as indicated.  INTERIM HISTORY:  [name] comes in for follow-up.  Unfortunately, she was hospitalized a couple weeks ago.  She needed to have 1 of her toes amputated because of cellulitis and osteomyelitis.  She has diabetes that is not under good control.  She was started on, I think, Victoza which she had a reaction to.  Dr. Victorino Dike did the amputation.  The 2nd toe of her right foot was amputated.  She grew out MRSA.  She was hospitalized for about 5 days.  She has some soreness over on the right foot.  She has had no obvious bleeding.  When we last saw her back in September, her ferritin was 358 with iron saturation of 32%.  The last time that she got Aranesp was back in August.  The last time she got iron was back in July.  PHYSICAL EXAMINATION:  General:  This is an obese white female in no obvious distress.  Vital signs:  98, pulse 71, respiratory rate 16, blood pressure 116/59.  Weight is 224.  Head and neck:  Normocephalic, atraumatic skull.  There are no ocular or oral lesions.  There are no palpable cervical or supraclavicular lymph nodes.  Lungs:  Clear bilaterally.  Cardiac:  Regular rate and rhythm with a normal S1 and S2. There are no murmurs, rubs, or bruits.  Abdomen:  Soft with good bowel sounds.  There is no palpable abdominal mass.  There is no fluid wave. No palpable hepatosplenomegaly.  Extremities:  Dressing around her right foot.  Left foot was unremarkable.  There is no edema in her legs. Skin:  No rashes, ecchymoses, or petechiae.  LABORATORY STUDIES:  White cell count is 10.5, hemoglobin 10.2, hematocrit 38.9,  platelet count 234.  MCV is 98.  IMPRESSION:  [name] is a 61 year old white female with diabetes. She has anemia.  The anemia is multifactorial.  I think we can probably hold off on giving her Aranesp today.  I realize that her hemoglobin is below 11, but I just do not think that she would benefit all that much.  She still is having back and hip issues.  She had an MRI last week.  She sees Dr. Wynetta Emery I think later this week.  I want to see her back in another month or so.  I want to make sure that we stay close in follow-up with her so that we can address the anemia, which I am sure will become more of a problem.    ______________________________ Josph Macho, M.D. PRE/MEDQ  D:  03/03/2012  T:  03/04/2012  Job:  [id number]

## 2012-04-06 ENCOUNTER — Emergency Department (HOSPITAL_BASED_OUTPATIENT_CLINIC_OR_DEPARTMENT_OTHER): Payer: BC Managed Care – PPO

## 2012-04-06 ENCOUNTER — Emergency Department (HOSPITAL_BASED_OUTPATIENT_CLINIC_OR_DEPARTMENT_OTHER)
Admission: EM | Admit: 2012-04-06 | Discharge: 2012-04-06 | Disposition: A | Discharge status: Home / Self Care | Payer: BC Managed Care – PPO | Attending: Emergency Medicine | Admitting: Emergency Medicine

## 2012-04-06 ENCOUNTER — Encounter (HOSPITAL_BASED_OUTPATIENT_CLINIC_OR_DEPARTMENT_OTHER): Payer: Self-pay | Admitting: *Deleted

## 2012-04-06 DIAGNOSIS — S98139A Complete traumatic amputation of one unspecified lesser toe, initial encounter: Secondary | ICD-10-CM | POA: Insufficient documentation

## 2012-04-06 DIAGNOSIS — E1129 Type 2 diabetes mellitus with other diabetic kidney complication: Secondary | ICD-10-CM | POA: Insufficient documentation

## 2012-04-06 DIAGNOSIS — Y939 Activity, unspecified: Secondary | ICD-10-CM | POA: Insufficient documentation

## 2012-04-06 DIAGNOSIS — Z872 Personal history of diseases of the skin and subcutaneous tissue: Secondary | ICD-10-CM | POA: Insufficient documentation

## 2012-04-06 DIAGNOSIS — Z79899 Other long term (current) drug therapy: Secondary | ICD-10-CM | POA: Insufficient documentation

## 2012-04-06 DIAGNOSIS — IMO0002 Reserved for concepts with insufficient information to code with codable children: Secondary | ICD-10-CM | POA: Insufficient documentation

## 2012-04-06 DIAGNOSIS — Z8739 Personal history of other diseases of the musculoskeletal system and connective tissue: Secondary | ICD-10-CM | POA: Insufficient documentation

## 2012-04-06 DIAGNOSIS — S92919A Unspecified fracture of unspecified toe(s), initial encounter for closed fracture: Secondary | ICD-10-CM

## 2012-04-06 DIAGNOSIS — Y929 Unspecified place or not applicable: Secondary | ICD-10-CM | POA: Insufficient documentation

## 2012-04-06 DIAGNOSIS — I129 Hypertensive chronic kidney disease with stage 1 through stage 4 chronic kidney disease, or unspecified chronic kidney disease: Secondary | ICD-10-CM | POA: Insufficient documentation

## 2012-04-06 DIAGNOSIS — N189 Chronic kidney disease, unspecified: Secondary | ICD-10-CM | POA: Insufficient documentation

## 2012-04-06 MED ORDER — AMOXICILLIN-POT CLAVULANATE 500-125 MG PO TABS: 1.0000 | ORAL_TABLET | Freq: Three times a day (TID) | ORAL | Status: DC

## 2012-04-06 NOTE — ED Notes (Signed)
Patient states that middle toe on her right foot is inflamed and hurts, she thinks she may have bumped it last week. Concerned because she is a diabetic and had a tope amputated due to mrsa. Able to ambulate

## 2012-04-06 NOTE — ED Provider Notes (Signed)
History     CSN: [id number]  Arrival date & time 04/06/12  1123   First MD Initiated Contact with Patient 04/06/12 1205      Chief Complaint  Patient presents with  . Toe Pain    (Consider location/radiation/quality/duration/timing/severity/associated sxs/prior treatment) HPI Comments: Patient with history of DM, recently had right second toe amputated due to osteomyelitis.  She bumped the third toe two days ago and now it is swollen and red.  No fevers or chills.    Patient is a 61 y.o. female presenting with toe pain. The history is provided by the patient.  Toe Pain This is a new problem. The current episode started 2 days ago. The problem occurs constantly. The problem has been gradually worsening.    Past Medical History  Diagnosis Date  . Anemia in chronic renal disease 06/05/2011  . Psoriasis 06/05/2011  . DDD (degenerative disc disease), cervical 06/05/2011  . DDD (degenerative disc disease), lumbosacral 06/05/2011  . HTN (hypertension) 06/05/2011  . Diabetes mellitus   . Sleep apnea     Past Surgical History  Procedure Date  . Back surgery   . Cholecystectomy   . Elbow surgery   . Abdominal hysterectomy   . Amputation 02/12/2012    Procedure: AMPUTATION RAY;  Surgeon: Toni Arthurs, MD;  Location: [facility] OR;  Service: Orthopedics;  Laterality: Right;  RIGHT 2ND TOE AMPUTATION     No family history on file.  History  Substance Use Topics  . Smoking status: Never Smoker   . Smokeless tobacco: Not on file  . Alcohol Use: No    OB History    Grav Para Term Preterm Abortions TAB SAB Ect Mult Living                  Review of Systems  All other systems reviewed and are negative.    Allergies  Ultram; Betadine; Contrast media; Fentanyl and related; Hydrocodone; Morphine and related; Percocet; Sulfa antibiotics; Valium; Vicodin; Victoza; Darvocet; and Wellbutrin  Home Medications   Current Outpatient Rx  Name  Route  Sig  Dispense  Refill  . BYDUREON [place]  Subcutaneous   Inject into the skin once a week.         Marland Kitchen ZOLPIDEM TARTRATE 10 MG PO TABS   Oral   Take 10 mg by mouth at bedtime as needed.         . ALBUTEROL SULFATE HFA 108 (90 BASE) MCG/ACT IN AERS   Inhalation   Inhale 2 puffs into the lungs every 6 (six) hours as needed. For shortness of breath         . GLIMEPIRIDE 4 MG PO TABS   Oral   Take 8 mg by mouth at bedtime.          Marland Kitchen LISINOPRIL-HYDROCHLOROTHIAZIDE 20-25 MG PO TABS   Oral   Take 1 tablet by mouth daily.         Marland Kitchen LORAZEPAM 1 MG PO TABS   Oral   Take 1 mg by mouth 2 (two) times daily before lunch and supper.         Marland Kitchen MEPERIDINE HCL 50 MG PO TABS   Oral   Take 1 tablet (50 mg total) by mouth every 4 (four) hours as needed for pain.   1 tablet        Resume as prior to admission.   Marland Kitchen METFORMIN HCL 1000 MG PO TABS   Oral   Take 1,000 mg by mouth  2 (two) times daily with a meal.         . PANTOPRAZOLE SODIUM 40 MG PO TBEC   Oral   Take 40 mg by mouth daily as needed. For acid reflux         . SIMVASTATIN 40 MG PO TABS   Oral   Take 40 mg by mouth every evening.         . VENLAFAXINE HCL ER 150 MG PO CP24   Oral   Take 150 mg by mouth 2 (two) times daily.           BP 151/67  Pulse 90  Temp 98.3 F (36.8 C) (Oral)  Resp 20  SpO2 97%  Physical Exam  Nursing note and vitals reviewed. Constitutional: She is oriented to person, place, and time. She appears well-developed and well-nourished.  HENT:  Head: Normocephalic and atraumatic.  Neck: Normal range of motion. Neck supple.  Musculoskeletal:       The right third toe is noted to be erythematous and warm to the touch.  There is no ulcer or lesion noted.  Neurological: She is alert and oriented to person, place, and time.  Skin: Skin is warm and dry.    ED Course  Procedures (including critical care time)  Labs Reviewed - No data to display No results found.   No diagnosis found.    MDM  The xrays show a  fracture of the proximal phalanx of the third toe.  Will treat this with augmentin due to the recent osteomyelitis of the second toe, follow up with orthopedics.        Geoffery Lyons, MD 04/06/12 [phone]

## 2012-04-14 ENCOUNTER — Ambulatory Visit (HOSPITAL_BASED_OUTPATIENT_CLINIC_OR_DEPARTMENT_OTHER): Payer: BC Managed Care – PPO | Admitting: Hematology & Oncology

## 2012-04-14 ENCOUNTER — Other Ambulatory Visit (HOSPITAL_BASED_OUTPATIENT_CLINIC_OR_DEPARTMENT_OTHER): Payer: BC Managed Care – PPO | Admitting: Lab

## 2012-04-14 VITALS — BP 124/60 | HR 90 | Temp 98.3°F | Resp 16 | Ht 63.0 in | Wt 221.0 lb

## 2012-04-14 DIAGNOSIS — D631 Anemia in chronic kidney disease: Secondary | ICD-10-CM

## 2012-04-14 DIAGNOSIS — D649 Anemia, unspecified: Secondary | ICD-10-CM

## 2012-04-14 DIAGNOSIS — D509 Iron deficiency anemia, unspecified: Secondary | ICD-10-CM

## 2012-04-14 LAB — RETICULOCYTES (CHCC)
ABS Retic: 125.3 10*3/uL (ref 19.0–186.0)
RBC.: 3.48 MIL/uL — ABNORMAL LOW (ref 3.87–5.11)

## 2012-04-14 LAB — CBC WITH DIFFERENTIAL (CANCER CENTER ONLY)
BASO%: 0.1 % (ref 0.0–2.0)
HCT: 32.4 % — ABNORMAL LOW (ref 34.8–46.6)
LYMPH%: 21.9 % (ref 14.0–48.0)
MCH: 32.4 pg (ref 26.0–34.0)
MCV: 95 fL (ref 81–101)
MONO#: 0.6 10*3/uL (ref 0.1–0.9)
MONO%: 7 % (ref 0.0–13.0)
NEUT%: 70.9 % (ref 39.6–80.0)
Platelets: 228 10*3/uL (ref 145–400)
RDW: 12.9 % (ref 11.1–15.7)

## 2012-04-14 LAB — FERRITIN: Ferritin: 332 ng/mL — ABNORMAL HIGH (ref 10–291)

## 2012-04-14 LAB — IRON AND TIBC
%SAT: 28 % (ref 20–55)
Iron: 92 ug/dL (ref 42–145)

## 2012-04-14 NOTE — Progress Notes (Signed)
This office note has been dictated.

## 2012-04-16 NOTE — Progress Notes (Signed)
CC:   Lupita Raider, M.D. Donalee Citrin, M.D. Toni Arthurs, MD  DIAGNOSES: 1. Anemia of renal insufficiency. 2. Intermittent iron deficiency anemia. 3. Insulin dependent diabetes.  CURRENT THERAPY: 1. Aranesp 300 mcg subcu as needed for hemoglobin less than 11. 2. IV iron as indicated.  INTERIM HISTORY:  [name] comes in for followup.  Unfortunately she bumped the 3rd toe of her right foot.  She did this a week ago.  She has a walking boot on.  She sees Dr. Wynetta Emery of neurosurgery soon.  It sounds like she is going to have back surgery.  She has already had I think 4 surgeries.  She is still having a lot of pain.  When we last saw her, her iron studies showed a ferritin of 248 with iron saturation of 30%.  She may be a little bit lower this time.  She still feels tired.  PHYSICAL EXAMINATION:  General:  This is a well-developed, well- nourished white female in no obvious distress.  Vital signs:  Show a temperature of 98.3, pulse 90, respiratory rate 16, blood pressure 124/80.  Weight is 221.  Head and neck:  Shows a normocephalic, atraumatic skull.  There are no ocular or oral lesions.  There are no palpable cervical or supraclavicular lymph nodes.  Lungs:  Clear bilaterally.  Cardiac:  Regular rate and rhythm with a normal S1 and S2. There are no murmurs, rubs or bruits.  Abdomen:  Soft with good bowel sounds.  There is no palpable abdominal mass.  There is no fluid wave. There is no palpable hepatosplenomegaly.  Extremities:  Show no clubbing, cyanosis or edema.  She has the walking boot on the right foot.  Neurological:  Shows no focal neurological deficits.  LABORATORY STUDIES:  White cell count is 8.5, hemoglobin 11, hematocrit 32.4, platelet count 228.  MCV is 95.  IMPRESSION:  [name] is a 61 year old white female with multifactorial anemia.  She does not need anything today.  We will see what her iron studies show.  Again, she may be a little on the low side. If  so, IV iron should be able to help her out a little bit.  We will go ahead and plan to get her back to see me in another month.  I suspect that she may be having surgery for her back.  If so, we can always readjust our appointments.    ______________________________ Josph Macho, M.D. PRE/MEDQ  D:  04/14/2012  T:  04/15/2012  Job:  [id number]

## 2012-05-12 ENCOUNTER — Ambulatory Visit (HOSPITAL_BASED_OUTPATIENT_CLINIC_OR_DEPARTMENT_OTHER): Payer: BC Managed Care – PPO | Admitting: Medical

## 2012-05-12 ENCOUNTER — Other Ambulatory Visit (HOSPITAL_BASED_OUTPATIENT_CLINIC_OR_DEPARTMENT_OTHER): Payer: BC Managed Care – PPO | Admitting: Lab

## 2012-05-12 ENCOUNTER — Ambulatory Visit (HOSPITAL_BASED_OUTPATIENT_CLINIC_OR_DEPARTMENT_OTHER): Payer: BC Managed Care – PPO

## 2012-05-12 ENCOUNTER — Other Ambulatory Visit: Payer: BC Managed Care – PPO | Admitting: Lab

## 2012-05-12 ENCOUNTER — Ambulatory Visit: Payer: BC Managed Care – PPO | Admitting: Hematology & Oncology

## 2012-05-12 VITALS — BP 110/50 | HR 63 | Temp 98.0°F | Resp 16 | Ht 63.0 in | Wt 220.0 lb

## 2012-05-12 DIAGNOSIS — N289 Disorder of kidney and ureter, unspecified: Secondary | ICD-10-CM

## 2012-05-12 DIAGNOSIS — D649 Anemia, unspecified: Secondary | ICD-10-CM

## 2012-05-12 DIAGNOSIS — D509 Iron deficiency anemia, unspecified: Secondary | ICD-10-CM

## 2012-05-12 DIAGNOSIS — D631 Anemia in chronic kidney disease: Secondary | ICD-10-CM

## 2012-05-12 DIAGNOSIS — N189 Chronic kidney disease, unspecified: Secondary | ICD-10-CM

## 2012-05-12 LAB — CBC WITH DIFFERENTIAL (CANCER CENTER ONLY)
BASO%: 0.1 % (ref 0.0–2.0)
EOS%: 0 % (ref 0.0–7.0)
HCT: 30.4 % — ABNORMAL LOW (ref 34.8–46.6)
LYMPH#: 1.5 10*3/uL (ref 0.9–3.3)
MCHC: 33.9 g/dL (ref 32.0–36.0)
MONO#: 0.5 10*3/uL (ref 0.1–0.9)
NEUT#: 5 10*3/uL (ref 1.5–6.5)
RDW: 13.1 % (ref 11.1–15.7)
WBC: 6.9 10*3/uL (ref 3.9–10.0)

## 2012-05-12 LAB — IRON AND TIBC
%SAT: 24 % (ref 20–55)
Iron: 78 ug/dL (ref 42–145)
TIBC: 325 ug/dL (ref 250–470)
UIBC: 247 ug/dL (ref 125–400)

## 2012-05-12 MED ORDER — DARBEPOETIN ALFA-POLYSORBATE 300 MCG/0.6ML IJ SOLN
300.0000 ug | Freq: Once | INTRAMUSCULAR | Status: AC
  Administered 2012-05-12: 300 ug via SUBCUTANEOUS

## 2012-05-12 NOTE — Progress Notes (Signed)
Diagnoses: #1   anemia of renal insufficiency. #2.  Intermittent iron deficiency anemia. #3.  Insulin-dependent diabetes.  Current therapy: #1.  Aranesp 300 mcg subcutaneous as needed.  For hemoglobin less than 11. #2.  IV iron as indicated.  Last, time, she received IV iron was back on July 2.  Interim history: [name] presents today for an office followup visit.  She reports, that she's doing relatively well except for some back issues.  She states she started had 3 back surgeries, and she might need a fourth.  She does see Dr. Wynetta Emery of neurosurgery.  Her last iron studies were good.  Her iron was 92, with 20% saturation.  Her ferritin was 332.  The last, time, she had to have IV iron was back in July.  She reports, her sugars have been running about 149.  She was recently placed on Bydureon injection once a week, which have improved her overall, glucose level.  She does not report any new problems or new medicines.  She reports, she has a good appetite.  She denies any nausea, vomiting, diarrhea, constipation, chest pain, shortness of breath, or cough.  She denies any fevers, chills, or night sweats.  She denies any lower leg swelling.  She denies any abdominal pain.  She denies any headaches, visual changes, or rashes.  Review of Systems: Constitutional:Negative for malaise/fatigue, fever, chills, weight loss, diaphoresis, activity change, appetite change, and unexpected weight change.  HEENT: Negative for double vision, blurred vision, visual loss, ear pain, tinnitus, congestion, rhinorrhea, epistaxis sore throat or sinus disease, oral pain/lesion, tongue soreness Respiratory: Negative for cough, chest tightness, shortness of breath, wheezing and stridor.  Cardiovascular: Negative for chest pain, palpitations, leg swelling, orthopnea, PND, DOE or claudication Gastrointestinal: Negative for nausea, vomiting, abdominal pain, diarrhea, constipation, blood in stool, melena, hematochezia,  abdominal distention, anal bleeding, rectal pain, anorexia and hematemesis.  Genitourinary: Negative for dysuria, frequency, hematuria,  Musculoskeletal: Negative for myalgias, back pain, joint swelling, arthralgias and gait problem.  Skin: Negative for rash, color change, pallor and wound.  Neurological:. Negative for dizziness/light-headedness, tremors, seizures, syncope, facial asymmetry, speech difficulty, weakness, numbness, headaches and paresthesias.  Hematological: Negative for adenopathy. Does not bruise/bleed easily.  Psychiatric/Behavioral:  Negative for depression, no loss of interest in normal activity or change in sleep pattern.   Physical Exam: This is a pleasant, 62 year old, well-developed, well-nourished, white female, in no obvious distress Vitals: Temperature 90.0 degrees, pulse 63, respirations 16, blood pressure 110/50 weight 220 pounds HEENT reveals a normocephalic, atraumatic skull, no scleral icterus, no oral lesions  Neck is supple without any cervical or supraclavicular adenopathy.  Lungs are clear to auscultation bilaterally. There are no wheezes, rales or rhonci Cardiac is regular rate and rhythm with a normal S1 and S2. There are no murmurs, rubs, or bruits.  Abdomen is soft with good bowel sounds, there is no palpable mass. There is no palpable hepatosplenomegaly. There is no palpable fluid wave.  Musculoskeletal no tenderness of the spine, ribs, or hips.  Extremities there are no clubbing, cyanosis, or edema.  Skin no petechia, purpura or ecchymosis Neurologic is nonfocal.  Laboratory Data: White count 6.9, hemoglobin 10.3, hematocrit 30.4, platelets 225,000  Current Outpatient Prescriptions on File Prior to Visit  Medication Sig Dispense Refill  . clobetasol (OLUX) 0.05 % topical foam Apply topically as needed.       . Exenatide (BYDUREON [place]) Inject into the skin once a week.      Marland Kitchen glimepiride (AMARYL) 4  MG tablet Take 8 mg by mouth at bedtime.       Marland Kitchen  lisinopril-hydrochlorothiazide (PRINZIDE,ZESTORETIC) 20-25 MG per tablet Take 1 tablet by mouth daily.      Marland Kitchen LORazepam (ATIVAN) 1 MG tablet Take 1 mg by mouth 2 (two) times daily before lunch and supper.      . meperidine (DEMEROL) 50 MG tablet Take 50 mg by mouth as needed.      . metFORMIN (GLUCOPHAGE) 1000 MG tablet Take 1,000 mg by mouth 2 (two) times daily with a meal.      . pantoprazole (PROTONIX) 40 MG tablet Take 40 mg by mouth daily as needed. For acid reflux      . simvastatin (ZOCOR) 40 MG tablet Take 40 mg by mouth every evening.      . venlafaxine (EFFEXOR-XR) 150 MG 24 hr capsule Take 150 mg by mouth 2 (two) times daily.      Marland Kitchen zolpidem (AMBIEN) 10 MG tablet Take 10 mg by mouth at bedtime as needed.       Assessment/Plan: This is a pleasant, 62 year old, white female, with the following issues:  #1.  Anemia of renal insufficiency.  Her hemoglobin is less than 11, as, such, she will receive an Aranesp injection.  #2.  Intermittent iron deficiency anemia.  We did get an iron panel on her today.  We will see what the results are and bring her in for IV iron as needed.  #3.  Followup.  We will follow back up with [name] in one month, but before then should there be questions or concerns.

## 2012-05-12 NOTE — Patient Instructions (Signed)
Darbepoetin Alfa injection What is this medicine? DARBEPOETIN ALFA (dar be POE e tin AL fa) helps your body make more red blood cells. It is used to treat anemia caused by chronic kidney failure and chemotherapy. This medicine may be used for other purposes; ask your health care provider or pharmacist if you have questions. What should I tell my health care provider before I take this medicine? They need to know if you have any of these conditions: -blood clotting disorders or history of blood clots -cancer patient not on chemotherapy -cystic fibrosis -heart disease, such as angina, heart failure, or a history of a heart attack -hemoglobin level of 12 g/dL or greater -high blood pressure -low levels of folate, iron, or vitamin B12 -seizures -an unusual or allergic reaction to darbepoetin, erythropoietin, albumin, hamster proteins, latex, other medicines, foods, dyes, or preservatives -pregnant or trying to get pregnant -breast-feeding How should I use this medicine? This medicine is for injection into a vein or under the skin. It is usually given by a health care professional in a hospital or clinic setting. If you get this medicine at home, you will be taught how to prepare and give this medicine. Do not shake the solution before you withdraw a dose. Use exactly as directed. Take your medicine at regular intervals. Do not take your medicine more often than directed. It is important that you put your used needles and syringes in a special sharps container. Do not put them in a trash can. If you do not have a sharps container, call your pharmacist or healthcare provider to get one. Talk to your pediatrician regarding the use of this medicine in children. While this medicine may be used in children as young as 1 year for selected conditions, precautions do apply. Overdosage: If you think you have taken too much of this medicine contact a poison control center or emergency room at once. NOTE:  This medicine is only for you. Do not share this medicine with others. What if I miss a dose? If you miss a dose, take it as soon as you can. If it is almost time for your next dose, take only that dose. Do not take double or extra doses. What may interact with this medicine? Do not take this medicine with any of the following medications: -epoetin alfa This list may not describe all possible interactions. Give your health care provider a list of all the medicines, herbs, non-prescription drugs, or dietary supplements you use. Also tell them if you smoke, drink alcohol, or use illegal drugs. Some items may interact with your medicine. What should I watch for while using this medicine? Visit your prescriber or health care professional for regular checks on your progress and for the needed blood tests and blood pressure measurements. It is especially important for the doctor to make sure your hemoglobin level is in the desired range, to limit the risk of potential side effects and to give you the best benefit. Keep all appointments for any recommended tests. Check your blood pressure as directed. Ask your doctor what your blood pressure should be and when you should contact him or her. As your body makes more red blood cells, you may need to take iron, folic acid, or vitamin B supplements. Ask your doctor or health care provider which products are right for you. If you have kidney disease continue dietary restrictions, even though this medication can make you feel better. Talk with your doctor or health care professional about the   foods you eat and the vitamins that you take. What side effects may I notice from receiving this medicine? Side effects that you should report to your doctor or health care professional as soon as possible: -allergic reactions like skin rash, itching or hives, swelling of the face, lips, or tongue -breathing problems -changes in vision -chest pain -confusion, trouble speaking  or understanding -feeling faint or lightheaded, falls -high blood pressure -muscle aches or pains -pain, swelling, warmth in the leg -rapid weight gain -severe headaches -sudden numbness or weakness of the face, arm or leg -trouble walking, dizziness, loss of balance or coordination -seizures (convulsions) -swelling of the ankles, feet, hands -unusually weak or tired Side effects that usually do not require medical attention (report to your doctor or health care professional if they continue or are bothersome): -diarrhea -fever, chills (flu-like symptoms) -headaches -nausea, vomiting -redness, stinging, or swelling at site where injected This list may not describe all possible side effects. Call your doctor for medical advice about side effects. You may report side effects to FDA at 1-800-FDA-1088. Where should I keep my medicine? Keep out of the reach of children. Store in a refrigerator between 2 and 8 degrees C (36 and 46 degrees F). Do not freeze. Do not shake. Throw away any unused portion if using a single-dose vial. Throw away any unused medicine after the expiration date. NOTE: This sheet is a summary. It may not cover all possible information. If you have questions about this medicine, talk to your doctor, pharmacist, or health care provider.  2012, [employer]vier/Gold Standard. (04/06/2008 10:23:57 AM) 

## 2012-05-14 ENCOUNTER — Telehealth: Payer: Self-pay | Admitting: *Deleted

## 2012-05-14 NOTE — Telephone Encounter (Signed)
Called patient to let her know that her iron levels were good per dr. Lupita Leash

## 2012-05-14 NOTE — Telephone Encounter (Signed)
Message copied by Anselm Jungling on Wed May 14, 2012 11:15 AM ------      Message from: Arlan Organ R      Created: Tue May 13, 2012  2:03 PM       CALL - IRON LEVEL IS GOOD.  pETE

## 2012-06-09 ENCOUNTER — Ambulatory Visit (HOSPITAL_BASED_OUTPATIENT_CLINIC_OR_DEPARTMENT_OTHER): Payer: BC Managed Care – PPO | Admitting: Hematology & Oncology

## 2012-06-09 ENCOUNTER — Other Ambulatory Visit (HOSPITAL_BASED_OUTPATIENT_CLINIC_OR_DEPARTMENT_OTHER): Payer: BC Managed Care – PPO | Admitting: Lab

## 2012-06-09 ENCOUNTER — Ambulatory Visit (HOSPITAL_BASED_OUTPATIENT_CLINIC_OR_DEPARTMENT_OTHER): Payer: BC Managed Care – PPO

## 2012-06-09 VITALS — BP 116/51 | HR 69 | Temp 98.4°F | Resp 16 | Ht 63.0 in | Wt 220.0 lb

## 2012-06-09 DIAGNOSIS — D509 Iron deficiency anemia, unspecified: Secondary | ICD-10-CM

## 2012-06-09 DIAGNOSIS — D649 Anemia, unspecified: Secondary | ICD-10-CM

## 2012-06-09 DIAGNOSIS — D631 Anemia in chronic kidney disease: Secondary | ICD-10-CM

## 2012-06-09 DIAGNOSIS — N189 Chronic kidney disease, unspecified: Secondary | ICD-10-CM

## 2012-06-09 DIAGNOSIS — M549 Dorsalgia, unspecified: Secondary | ICD-10-CM

## 2012-06-09 LAB — CBC WITH DIFFERENTIAL (CANCER CENTER ONLY)
BASO#: 0 10*3/uL (ref 0.0–0.2)
Eosinophils Absolute: 0 10*3/uL (ref 0.0–0.5)
HGB: 10.5 g/dL — ABNORMAL LOW (ref 11.6–15.9)
MCH: 32.5 pg (ref 26.0–34.0)
MONO%: 8.7 % (ref 0.0–13.0)
NEUT#: 5.3 10*3/uL (ref 1.5–6.5)
RBC: 3.23 10*6/uL — ABNORMAL LOW (ref 3.70–5.32)

## 2012-06-09 LAB — IRON AND TIBC
%SAT: 28 % (ref 20–55)
Iron: 92 ug/dL (ref 42–145)
UIBC: 233 ug/dL (ref 125–400)

## 2012-06-09 MED ORDER — DARBEPOETIN ALFA-POLYSORBATE 300 MCG/0.6ML IJ SOLN
300.0000 ug | Freq: Once | INTRAMUSCULAR | Status: AC
  Administered 2012-06-09: 300 ug via SUBCUTANEOUS

## 2012-06-09 NOTE — Progress Notes (Signed)
This office note has been dictated.

## 2012-06-10 NOTE — Progress Notes (Signed)
CC:   [name], M.D. [name], M.D.  DIAGNOSES: 1. Anemia of renal insufficiency. 2. Intermittent iron-deficiency anemia. 3. Insulin-dependent diabetes. 4. Severe back issues.  CURRENT THERAPY: 1. Aranesp 300 mcg subcu as needed for hemoglobin less than 11. 2. IV iron with Feraheme as indicated.  INTERIM HISTORY:  [name] comes in for her followup.  She will be undergoing back surgery.  It sounds like a lower back fusion.  Dr. Wynetta Emery will perform the surgery.  She is not sure when this will be.  She told me that he was supposed to send over a release form a week and a half ago.  We have not yet gotten one.  We will call his office to see about having one sent over.  For my point of view, I do not see any issues with her having spine surgery.  Her hematologic issues will not impact the surgery or the healing.  She is followed with iron studies every time we see her.  When we last saw her, her ferritin was 388 with an iron saturation of 24%.  Iron was 78.  We are checking the iron studies today.  It is possible that she may need to have another dose of iron before back surgery.  Her last iron was back in July.  Her blood sugars are fairly well controlled.  She is not on any medicine for this.  She has had no bleeding.  No change in bowel or bladder habits.  She has had no leg swelling.  There have been no rashes.  PHYSICAL EXAMINATION:  General:  This is a well-developed, well- nourished white female in no obvious distress.  Vital signs: Temperature of 98.4, pulse 69, respiratory rate 16, blood pressure 116/51.  Weight is 220.  Head and neck:  Normocephalic, atraumatic skull.  There are no ocular or oral lesions.  There are no palpable cervical or supraclavicular lymph nodes.  Lungs:  Clear bilaterally. Cardiac:  Regular rate and rhythm with a normal S1 and S2.  There are no murmurs, rubs, or bruits.  Abdomen:  Soft with good bowel sounds.  There is no palpable  abdominal mass.  There is no palpable hepatosplenomegaly. Extremities:  Some trace edema in her lower legs.  Back:  No tenderness over the spine.  LABORATORY STUDIES:  White cell count 7.6, hemoglobin 10.5, hematocrit 31.1, platelet count 236.  MCV is 96.  IMPRESSION:  [name] is a very nice 61 year old white female with multifactorial anemia.  We will go ahead and give her a dose of Aranesp today.  It is also possible that she may need iron.  Her iron saturation is falling.  Her total iron is falling.  The ferritin is on the high side, I think because of the inflammatory issues with her back.  Again, I see no problems with her having spine surgery.  I do not see any problems with her healing up or bleeding from her anemia.  We will have [name] come back to see [place] probably in about 2 months' time.    ______________________________ Josph Macho, M.D. PRE/MEDQ  D:  06/09/2012  T:  06/10/2012  Job:  [id number]

## 2012-06-25 ENCOUNTER — Other Ambulatory Visit: Payer: Self-pay | Admitting: Neurosurgery

## 2012-06-26 ENCOUNTER — Encounter (HOSPITAL_COMMUNITY): Payer: Self-pay | Admitting: Pharmacist

## 2012-07-02 ENCOUNTER — Encounter (HOSPITAL_COMMUNITY): Payer: Self-pay

## 2012-07-02 ENCOUNTER — Encounter (HOSPITAL_COMMUNITY)
Admission: RE | Admit: 2012-07-02 | Discharge: 2012-07-02 | Disposition: A | Discharge status: Home / Self Care | Payer: BC Managed Care – PPO | Source: Ambulatory Visit | Attending: Neurosurgery | Admitting: Neurosurgery

## 2012-07-02 ENCOUNTER — Encounter (HOSPITAL_COMMUNITY)
Admission: RE | Admit: 2012-07-02 | Discharge: 2012-07-02 | Disposition: A | Discharge status: Home / Self Care | Payer: BC Managed Care – PPO | Source: Ambulatory Visit | Attending: Anesthesiology | Admitting: Anesthesiology

## 2012-07-02 DIAGNOSIS — E669 Obesity, unspecified: Secondary | ICD-10-CM | POA: Insufficient documentation

## 2012-07-02 DIAGNOSIS — N039 Chronic nephritic syndrome with unspecified morphologic changes: Secondary | ICD-10-CM | POA: Insufficient documentation

## 2012-07-02 DIAGNOSIS — I2510 Atherosclerotic heart disease of native coronary artery without angina pectoris: Secondary | ICD-10-CM | POA: Insufficient documentation

## 2012-07-02 DIAGNOSIS — D631 Anemia in chronic kidney disease: Secondary | ICD-10-CM | POA: Insufficient documentation

## 2012-07-02 DIAGNOSIS — K219 Gastro-esophageal reflux disease without esophagitis: Secondary | ICD-10-CM | POA: Insufficient documentation

## 2012-07-02 DIAGNOSIS — L408 Other psoriasis: Secondary | ICD-10-CM | POA: Insufficient documentation

## 2012-07-02 DIAGNOSIS — Z01818 Encounter for other preprocedural examination: Secondary | ICD-10-CM | POA: Insufficient documentation

## 2012-07-02 DIAGNOSIS — D509 Iron deficiency anemia, unspecified: Secondary | ICD-10-CM | POA: Insufficient documentation

## 2012-07-02 DIAGNOSIS — E119 Type 2 diabetes mellitus without complications: Secondary | ICD-10-CM | POA: Insufficient documentation

## 2012-07-02 DIAGNOSIS — I10 Essential (primary) hypertension: Secondary | ICD-10-CM | POA: Insufficient documentation

## 2012-07-02 DIAGNOSIS — G4733 Obstructive sleep apnea (adult) (pediatric): Secondary | ICD-10-CM | POA: Insufficient documentation

## 2012-07-02 DIAGNOSIS — E785 Hyperlipidemia, unspecified: Secondary | ICD-10-CM | POA: Insufficient documentation

## 2012-07-02 DIAGNOSIS — Z01812 Encounter for preprocedural laboratory examination: Secondary | ICD-10-CM | POA: Insufficient documentation

## 2012-07-02 DIAGNOSIS — J45909 Unspecified asthma, uncomplicated: Secondary | ICD-10-CM | POA: Insufficient documentation

## 2012-07-02 HISTORY — DX: Personal history of colonic polyps: Z86.010

## 2012-07-02 HISTORY — DX: Headache: R51

## 2012-07-02 HISTORY — DX: Insomnia, unspecified: G47.00

## 2012-07-02 HISTORY — DX: Other specified postprocedural states: Z98.890

## 2012-07-02 HISTORY — DX: Personal history of colon polyps, unspecified: Z86.0100

## 2012-07-02 HISTORY — DX: Other specified postprocedural states: R11.2

## 2012-07-02 HISTORY — DX: Cough: R05

## 2012-07-02 HISTORY — DX: Anxiety disorder, unspecified: F41.9

## 2012-07-02 HISTORY — DX: Personal history of Methicillin resistant Staphylococcus aureus infection: Z86.14

## 2012-07-02 HISTORY — DX: Personal history of other diseases of the respiratory system: Z87.09

## 2012-07-02 HISTORY — DX: Major depressive disorder, single episode, unspecified: F32.9

## 2012-07-02 HISTORY — DX: Dizziness and giddiness: R42

## 2012-07-02 HISTORY — DX: Gastro-esophageal reflux disease without esophagitis: K21.9

## 2012-07-02 HISTORY — DX: Cough, unspecified: R05.9

## 2012-07-02 HISTORY — DX: Depression, unspecified: F32.A

## 2012-07-02 HISTORY — DX: Type 2 diabetes mellitus with diabetic neuropathy, unspecified: E11.40

## 2012-07-02 HISTORY — DX: Hyperlipidemia, unspecified: E78.5

## 2012-07-02 HISTORY — DX: Unspecified asthma, uncomplicated: J45.909

## 2012-07-02 HISTORY — DX: Other seasonal allergic rhinitis: J30.2

## 2012-07-02 LAB — TYPE AND SCREEN
ABO/RH(D): A POS
Antibody Screen: NEGATIVE

## 2012-07-02 LAB — BASIC METABOLIC PANEL
Calcium: 8.9 mg/dL (ref 8.4–10.5)
Creatinine, Ser: 1.18 mg/dL — ABNORMAL HIGH (ref 0.50–1.10)
GFR calc Af Amer: 57 mL/min — ABNORMAL LOW (ref >90)
GFR calc non Af Amer: 49 mL/min — ABNORMAL LOW (ref >90)
Sodium: 137 mEq/L (ref 135–145)

## 2012-07-02 LAB — CBC
Platelets: 281 10*3/uL (ref 150–400)
RDW: 13.3 % (ref 11.5–15.5)
WBC: 9.7 10*3/uL (ref 4.0–10.5)

## 2012-07-02 LAB — SURGICAL PCR SCREEN
MRSA, PCR: POSITIVE — AB
Staphylococcus aureus: POSITIVE — AB

## 2012-07-02 NOTE — Progress Notes (Signed)
Sleep study done > 79yrs ago and pt does use a cpap

## 2012-07-02 NOTE — Progress Notes (Signed)
Left a message on [name]'s machine that I need orders to be placed into epic

## 2012-07-02 NOTE — Progress Notes (Addendum)
Dr.Jonathan Allyson Sabal is cardiologist-has been several yrs since she has seen him  Heart cath/stress/echo reports done bc pt c/o heaviness-done >3yrs ago  Dr.[name] [name]-Medical MD  Denies EKG or CXR being done within past yr

## 2012-07-02 NOTE — Pre-Procedure Instructions (Signed)
[name]  07/02/2012   Your procedure is scheduled on:  Mon, Mar 3 @ 7:30 AM  Report to [facility] at 5:30 AM.  Call this number if you have problems the morning of surgery: [phone]   Remember:   Do not eat food or drink liquids after midnight.   Take these medicines the morning of surgery with A SIP OF WATER: Albuterol<Bring Your Inhaler With You>,Cymbalta(Duloxetine),Ativan(Lorazepam),Pain Pill(if needed),and Pantoprazole(Protonix)               No Aspirin,Ibuprofen,Goody's,BC's,Aleve,Fish Oil,or any Herbal Medications   Do not wear jewelry, make-up or nail polish.  Do not wear lotions, powders, or perfumes. You may wear deodorant.  Do not shave 48 hours prior to surgery.   Do not bring valuables to the hospital.  Contacts, dentures or bridgework may not be worn into surgery.  Leave suitcase in the car. After surgery it may be brought to your room.  For patients admitted to the hospital, checkout time is 11:00 AM the day of  discharge.   Patients discharged the day of surgery will not be allowed to drive  home.    Special Instructions: Shower using CHG 2 nights before surgery and the night before surgery.  If you shower the day of surgery use CHG.  Use special wash - you have one bottle of CHG for all showers.  You should use approximately 1/3 of the bottle for each shower.   Please read over the following fact sheets that you were given: Pain Booklet, Coughing and Deep Breathing, Blood Transfusion Information, MRSA Information and Surgical Site Infection Prevention

## 2012-07-03 ENCOUNTER — Encounter (HOSPITAL_COMMUNITY): Payer: Self-pay

## 2012-07-03 NOTE — Progress Notes (Signed)
Anesthesia chart review: Patient is a 62 year old female posted for posterior lumbar fusion 1 with hardware removal on 07/07/2012 by Dr. Wynetta Emery. History includes obesity, nonsmoker, postoperative nausea/vomiting, psoriasis, hypertension, hyperlipidemia, asthma, diabetes mellitus type 2, obstructive sleep apnea, GERD, anemia of renal insufficiency and iron deficiency (Dr. Myna Hidalgo).  PCP is Dr. Lupita Raider.  Both Dr. Myna Hidalgo and Dr. Clelia Croft have cleared patient for this procedure.    She has been evaluated by Cardiologist Dr. Allyson Sabal [facility]) in the past for non-critical CAD by cath at [facility] of 08/09/04.  This showed a normal left main, LAD, ramus intermedius branch, but a 50% hypodense lesion in the mid AV groove circumflex, EF > 60%.  It appears she was last seen around 2010.  Her last nuclear stress test was on 08/18/08 and showed normal myocardial perfusion demonstrating an attenuation artifact in the anterior region of the myocardium. No ischemia or infarct/scar seen in the remaining myocardium. Post stress LV function is normal with EF 76%. Low risk scan.  EKG on 07/02/12 showed NSR, cannot rule out anterior infarct (age undetermined), motion in leads V5, V6. Overall, I think her EKG appears stable since 10/08/07.    Preoperative CXR and labs noted.  She has been cleared by her hematologist and PCP.  Her EKG appears stable.  If no acute changes then would anticipate she could proceed as planned.  Shonna Chock, PA-C 07/03/12 [phone]

## 2012-07-06 MED ORDER — CEFAZOLIN SODIUM-DEXTROSE 2-3 GM-% IV SOLR: 2.0000 g | INTRAVENOUS | Status: DC

## 2012-07-07 ENCOUNTER — Encounter (HOSPITAL_COMMUNITY): Payer: Self-pay | Admitting: Certified Registered"

## 2012-07-07 ENCOUNTER — Inpatient Hospital Stay (HOSPITAL_COMMUNITY): Payer: BC Managed Care – PPO

## 2012-07-07 ENCOUNTER — Inpatient Hospital Stay (HOSPITAL_COMMUNITY)
Admission: RE | Admit: 2012-07-07 | Discharge: 2012-07-22 | DRG: 755 | Disposition: A | Discharge status: Home / Self Care | Payer: BC Managed Care – PPO | Source: Ambulatory Visit | Attending: Neurosurgery | Admitting: Neurosurgery

## 2012-07-07 ENCOUNTER — Inpatient Hospital Stay (HOSPITAL_COMMUNITY): Payer: BC Managed Care – PPO | Admitting: Anesthesiology

## 2012-07-07 ENCOUNTER — Encounter (HOSPITAL_COMMUNITY)
Admission: RE | Disposition: A | Discharge status: Home / Self Care | Payer: Self-pay | Source: Ambulatory Visit | Attending: Neurosurgery

## 2012-07-07 ENCOUNTER — Encounter (HOSPITAL_COMMUNITY): Payer: Self-pay | Admitting: Vascular Surgery

## 2012-07-07 DIAGNOSIS — F329 Major depressive disorder, single episode, unspecified: Secondary | ICD-10-CM | POA: Diagnosis present

## 2012-07-07 DIAGNOSIS — E1149 Type 2 diabetes mellitus with other diabetic neurological complication: Secondary | ICD-10-CM | POA: Diagnosis present

## 2012-07-07 DIAGNOSIS — M4716 Other spondylosis with myelopathy, lumbar region: Secondary | ICD-10-CM

## 2012-07-07 DIAGNOSIS — F411 Generalized anxiety disorder: Secondary | ICD-10-CM | POA: Diagnosis present

## 2012-07-07 DIAGNOSIS — G4730 Sleep apnea, unspecified: Secondary | ICD-10-CM | POA: Diagnosis present

## 2012-07-07 DIAGNOSIS — Z860100 Personal history of colon polyps, unspecified: Secondary | ICD-10-CM

## 2012-07-07 DIAGNOSIS — M47817 Spondylosis without myelopathy or radiculopathy, lumbosacral region: Principal | ICD-10-CM | POA: Diagnosis present

## 2012-07-07 DIAGNOSIS — M5137 Other intervertebral disc degeneration, lumbosacral region: Secondary | ICD-10-CM | POA: Diagnosis present

## 2012-07-07 DIAGNOSIS — G9741 Accidental puncture or laceration of dura during a procedure: Secondary | ICD-10-CM | POA: Diagnosis not present

## 2012-07-07 DIAGNOSIS — E1142 Type 2 diabetes mellitus with diabetic polyneuropathy: Secondary | ICD-10-CM | POA: Diagnosis present

## 2012-07-07 DIAGNOSIS — G970 Cerebrospinal fluid leak from spinal puncture: Secondary | ICD-10-CM

## 2012-07-07 DIAGNOSIS — M51379 Other intervertebral disc degeneration, lumbosacral region without mention of lumbar back pain or lower extremity pain: Secondary | ICD-10-CM | POA: Diagnosis present

## 2012-07-07 DIAGNOSIS — E785 Hyperlipidemia, unspecified: Secondary | ICD-10-CM | POA: Diagnosis present

## 2012-07-07 DIAGNOSIS — R5082 Postprocedural fever: Secondary | ICD-10-CM | POA: Diagnosis not present

## 2012-07-07 DIAGNOSIS — I10 Essential (primary) hypertension: Secondary | ICD-10-CM | POA: Diagnosis present

## 2012-07-07 DIAGNOSIS — M5030 Other cervical disc degeneration, unspecified cervical region: Secondary | ICD-10-CM | POA: Diagnosis present

## 2012-07-07 DIAGNOSIS — M5126 Other intervertebral disc displacement, lumbar region: Secondary | ICD-10-CM | POA: Diagnosis present

## 2012-07-07 DIAGNOSIS — F3289 Other specified depressive episodes: Secondary | ICD-10-CM | POA: Diagnosis present

## 2012-07-07 DIAGNOSIS — IMO0002 Reserved for concepts with insufficient information to code with codable children: Secondary | ICD-10-CM | POA: Diagnosis not present

## 2012-07-07 DIAGNOSIS — G4700 Insomnia, unspecified: Secondary | ICD-10-CM | POA: Diagnosis present

## 2012-07-07 DIAGNOSIS — K219 Gastro-esophageal reflux disease without esophagitis: Secondary | ICD-10-CM | POA: Diagnosis present

## 2012-07-07 DIAGNOSIS — J9819 Other pulmonary collapse: Secondary | ICD-10-CM | POA: Diagnosis not present

## 2012-07-07 DIAGNOSIS — J45909 Unspecified asthma, uncomplicated: Secondary | ICD-10-CM | POA: Diagnosis present

## 2012-07-07 LAB — GLUCOSE, CAPILLARY
Glucose-Capillary: 130 mg/dL — ABNORMAL HIGH (ref 70–99)
Glucose-Capillary: 235 mg/dL — ABNORMAL HIGH (ref 70–99)

## 2012-07-07 SURGERY — POSTERIOR LUMBAR FUSION 1 WITH HARDWARE REMOVAL
Anesthesia: General | Site: Back | Wound class: Clean

## 2012-07-07 MED ORDER — OXYMETAZOLINE HCL 0.05 % NA SOLN
NASAL | Status: DC | PRN
  Administered 2012-07-07: 2 via NASAL

## 2012-07-07 MED ORDER — LORAZEPAM 1 MG PO TABS
1.0000 mg | ORAL_TABLET | Freq: Three times a day (TID) | ORAL | Status: DC
  Administered 2012-07-07 – 2012-07-22 (×45): 1 mg via ORAL
  Filled 2012-07-07 (×45): qty 1

## 2012-07-07 MED ORDER — LISINOPRIL 20 MG PO TABS
20.0000 mg | ORAL_TABLET | Freq: Every day | ORAL | Status: DC
  Administered 2012-07-07 – 2012-07-22 (×14): 20 mg via ORAL
  Filled 2012-07-07 (×18): qty 1

## 2012-07-07 MED ORDER — HYDROMORPHONE HCL PF 1 MG/ML IJ SOLN
0.5000 mg | INTRAMUSCULAR | Status: DC | PRN
  Administered 2012-07-07 – 2012-07-08 (×4): 1 mg via INTRAVENOUS
  Administered 2012-07-08: 0.5 mg via INTRAVENOUS
  Filled 2012-07-07 (×5): qty 1

## 2012-07-07 MED ORDER — ACETAMINOPHEN 325 MG PO TABS
650.0000 mg | ORAL_TABLET | ORAL | Status: DC | PRN
  Administered 2012-07-08 – 2012-07-10 (×6): 650 mg via ORAL
  Administered 2012-07-10: 325 mg via ORAL
  Administered 2012-07-10: 650 mg via ORAL
  Administered 2012-07-11: 325 mg via ORAL
  Administered 2012-07-11 – 2012-07-14 (×8): 650 mg via ORAL
  Filled 2012-07-07 (×3): qty 2
  Filled 2012-07-07: qty 1
  Filled 2012-07-07 (×14): qty 2

## 2012-07-07 MED ORDER — ASPIRIN 81 MG PO TABS: 81.0000 mg | ORAL_TABLET | Freq: Every day | ORAL | Status: DC

## 2012-07-07 MED ORDER — PROPOFOL 10 MG/ML IV BOLUS
INTRAVENOUS | Status: DC | PRN
  Administered 2012-07-07: 150 mg via INTRAVENOUS

## 2012-07-07 MED ORDER — ALBUTEROL SULFATE HFA 108 (90 BASE) MCG/ACT IN AERS
1.0000 | INHALATION_SPRAY | RESPIRATORY_TRACT | Status: DC | PRN
  Filled 2012-07-07: qty 6.7

## 2012-07-07 MED ORDER — GLYCOPYRROLATE 0.2 MG/ML IJ SOLN
INTRAMUSCULAR | Status: DC | PRN
  Administered 2012-07-07: 0.6 mg via INTRAVENOUS
  Administered 2012-07-07: 0.2 mg via INTRAVENOUS

## 2012-07-07 MED ORDER — DEXAMETHASONE SODIUM PHOSPHATE 10 MG/ML IJ SOLN
INTRAMUSCULAR | Status: AC
  Filled 2012-07-07: qty 1

## 2012-07-07 MED ORDER — MENTHOL 3 MG MT LOZG: 1.0000 | LOZENGE | OROMUCOSAL | Status: DC | PRN

## 2012-07-07 MED ORDER — ONDANSETRON HCL 4 MG/2ML IJ SOLN
4.0000 mg | INTRAMUSCULAR | Status: DC | PRN
  Administered 2012-07-08: 4 mg via INTRAVENOUS
  Filled 2012-07-07 (×2): qty 2

## 2012-07-07 MED ORDER — ROCURONIUM BROMIDE 100 MG/10ML IV SOLN
INTRAVENOUS | Status: DC | PRN
  Administered 2012-07-07: 10 mg via INTRAVENOUS
  Administered 2012-07-07: 50 mg via INTRAVENOUS
  Administered 2012-07-07: 10 mg via INTRAVENOUS

## 2012-07-07 MED ORDER — POTASSIUM CHLORIDE IN NACL 20-0.45 MEQ/L-% IV SOLN
INTRAVENOUS | Status: DC
  Administered 2012-07-07 – 2012-07-16 (×6): via INTRAVENOUS
  Administered 2012-07-17: 1000 mL via INTRAVENOUS
  Administered 2012-07-19: 75 mL/h via INTRAVENOUS
  Filled 2012-07-07 (×35): qty 1000

## 2012-07-07 MED ORDER — SODIUM CHLORIDE 0.9 % IV SOLN
10.0000 mg | INTRAVENOUS | Status: DC | PRN
  Administered 2012-07-07: 10 ug/min via INTRAVENOUS
  Administered 2012-07-07: 25 ug/min via INTRAVENOUS

## 2012-07-07 MED ORDER — ASPIRIN EC 81 MG PO TBEC
81.0000 mg | DELAYED_RELEASE_TABLET | Freq: Every day | ORAL | Status: DC
  Administered 2012-07-07 – 2012-07-21 (×14): 81 mg via ORAL
  Filled 2012-07-07 (×17): qty 1

## 2012-07-07 MED ORDER — ALUM & MAG HYDROXIDE-SIMETH 200-200-20 MG/5ML PO SUSP
30.0000 mL | Freq: Four times a day (QID) | ORAL | Status: DC | PRN
  Administered 2012-07-18 (×2): 30 mL via ORAL
  Filled 2012-07-07 (×2): qty 30

## 2012-07-07 MED ORDER — PROMETHAZINE HCL 25 MG/ML IJ SOLN: 6.2500 mg | INTRAMUSCULAR | Status: DC | PRN

## 2012-07-07 MED ORDER — DULOXETINE HCL 60 MG PO CPEP
60.0000 mg | ORAL_CAPSULE | Freq: Every day | ORAL | Status: DC
  Administered 2012-07-07 – 2012-07-22 (×15): 60 mg via ORAL
  Filled 2012-07-07 (×17): qty 1

## 2012-07-07 MED ORDER — ACETAMINOPHEN 10 MG/ML IV SOLN
INTRAVENOUS | Status: AC
  Administered 2012-07-07: 1000 mg via INTRAVENOUS
  Filled 2012-07-07: qty 100

## 2012-07-07 MED ORDER — DOCUSATE SODIUM 100 MG PO CAPS
100.0000 mg | ORAL_CAPSULE | Freq: Two times a day (BID) | ORAL | Status: DC
  Administered 2012-07-07 – 2012-07-14 (×16): 100 mg via ORAL
  Filled 2012-07-07 (×17): qty 1

## 2012-07-07 MED ORDER — DEXAMETHASONE SODIUM PHOSPHATE 10 MG/ML IJ SOLN
10.0000 mg | INTRAMUSCULAR | Status: AC
  Administered 2012-07-07: 10 mg via INTRAVENOUS

## 2012-07-07 MED ORDER — BIOTIN 5000 MCG PO CAPS: 5000.0000 ug | ORAL_CAPSULE | Freq: Every day | ORAL | Status: DC

## 2012-07-07 MED ORDER — VANCOMYCIN HCL 1000 MG IV SOLR
750.0000 mg | Freq: Two times a day (BID) | INTRAVENOUS | Status: AC
  Administered 2012-07-07 – 2012-07-09 (×4): 750 mg via INTRAVENOUS
  Filled 2012-07-07 (×4): qty 750

## 2012-07-07 MED ORDER — NEOSTIGMINE METHYLSULFATE 1 MG/ML IJ SOLN
INTRAMUSCULAR | Status: DC | PRN
  Administered 2012-07-07: 4 mg via INTRAVENOUS

## 2012-07-07 MED ORDER — SIMVASTATIN 40 MG PO TABS
40.0000 mg | ORAL_TABLET | Freq: Every day | ORAL | Status: DC
  Administered 2012-07-07 – 2012-07-21 (×15): 40 mg via ORAL
  Filled 2012-07-07 (×18): qty 1

## 2012-07-07 MED ORDER — HYDROMORPHONE HCL PF 1 MG/ML IJ SOLN: 0.2500 mg | INTRAMUSCULAR | Status: DC | PRN

## 2012-07-07 MED ORDER — ACETAMINOPHEN 650 MG RE SUPP: 650.0000 mg | RECTAL | Status: DC | PRN

## 2012-07-07 MED ORDER — SODIUM CHLORIDE 0.9 % IJ SOLN: 3.0000 mL | INTRAMUSCULAR | Status: DC | PRN

## 2012-07-07 MED ORDER — VANCOMYCIN HCL IN DEXTROSE 1-5 GM/200ML-% IV SOLN
INTRAVENOUS | Status: AC
  Administered 2012-07-07: 1000 mg via INTRAVENOUS
  Filled 2012-07-07: qty 200

## 2012-07-07 MED ORDER — KETAMINE HCL 100 MG/ML IJ SOLN
INTRAMUSCULAR | Status: AC
  Filled 2012-07-07: qty 1

## 2012-07-07 MED ORDER — PANTOPRAZOLE SODIUM 40 MG IV SOLR
40.0000 mg | Freq: Every day | INTRAVENOUS | Status: DC
  Administered 2012-07-07: 40 mg via INTRAVENOUS
  Filled 2012-07-07 (×2): qty 40

## 2012-07-07 MED ORDER — METFORMIN HCL 500 MG PO TABS
1000.0000 mg | ORAL_TABLET | Freq: Two times a day (BID) | ORAL | Status: DC
  Administered 2012-07-07 – 2012-07-22 (×30): 1000 mg via ORAL
  Filled 2012-07-07 (×35): qty 2

## 2012-07-07 MED ORDER — CYCLOBENZAPRINE HCL 10 MG PO TABS
10.0000 mg | ORAL_TABLET | Freq: Three times a day (TID) | ORAL | Status: DC | PRN
  Filled 2012-07-07 (×2): qty 1

## 2012-07-07 MED ORDER — FENTANYL CITRATE 0.05 MG/ML IJ SOLN
INTRAMUSCULAR | Status: DC | PRN
  Administered 2012-07-07 (×2): 50 ug via INTRAVENOUS
  Administered 2012-07-07: 100 ug via INTRAVENOUS
  Administered 2012-07-07: 50 ug via INTRAVENOUS
  Administered 2012-07-07: 100 ug via INTRAVENOUS

## 2012-07-07 MED ORDER — KETAMINE HCL 10 MG/ML IJ SOLN
INTRAMUSCULAR | Status: DC | PRN
  Administered 2012-07-07: 20 mg via INTRAVENOUS

## 2012-07-07 MED ORDER — HYDROMORPHONE HCL 2 MG PO TABS
2.0000 mg | ORAL_TABLET | ORAL | Status: DC | PRN
  Administered 2012-07-07 – 2012-07-12 (×7): 2 mg via ORAL
  Filled 2012-07-07 (×10): qty 1

## 2012-07-07 MED ORDER — SODIUM CHLORIDE 0.9 % IJ SOLN
3.0000 mL | Freq: Two times a day (BID) | INTRAMUSCULAR | Status: DC
  Administered 2012-07-07 – 2012-07-18 (×11): 3 mL via INTRAVENOUS

## 2012-07-07 MED ORDER — SODIUM CHLORIDE 0.9 % IR SOLN
Status: DC | PRN
  Administered 2012-07-07: 09:00:00

## 2012-07-07 MED ORDER — CHLORHEXIDINE GLUCONATE CLOTH 2 % EX PADS
6.0000 | MEDICATED_PAD | Freq: Every day | CUTANEOUS | Status: AC
  Administered 2012-07-08 – 2012-07-11 (×4): 6 via TOPICAL

## 2012-07-07 MED ORDER — PHENOL 1.4 % MT LIQD: 1.0000 | OROMUCOSAL | Status: DC | PRN

## 2012-07-07 MED ORDER — BACITRACIN 50000 UNITS IM SOLR
INTRAMUSCULAR | Status: AC
  Filled 2012-07-07: qty 1

## 2012-07-07 MED ORDER — CLOBETASOL PROPIONATE 0.05 % EX CREA
TOPICAL_CREAM | Freq: Every day | CUTANEOUS | Status: DC | PRN
  Filled 2012-07-07: qty 15

## 2012-07-07 MED ORDER — MEPERIDINE HCL 50 MG PO TABS
50.0000 mg | ORAL_TABLET | Freq: Two times a day (BID) | ORAL | Status: DC | PRN
  Administered 2012-07-14 – 2012-07-17 (×3): 50 mg via ORAL
  Filled 2012-07-07 (×3): qty 1

## 2012-07-07 MED ORDER — HEMOSTATIC AGENTS (NO CHARGE) OPTIME
TOPICAL | Status: DC | PRN
  Administered 2012-07-07: 1 via TOPICAL

## 2012-07-07 MED ORDER — PANTOPRAZOLE SODIUM 40 MG PO TBEC
40.0000 mg | DELAYED_RELEASE_TABLET | Freq: Every day | ORAL | Status: DC | PRN
  Filled 2012-07-07 (×7): qty 1

## 2012-07-07 MED ORDER — LISINOPRIL-HYDROCHLOROTHIAZIDE 20-25 MG PO TABS: 1.0000 | ORAL_TABLET | Freq: Every day | ORAL | Status: DC

## 2012-07-07 MED ORDER — MUPIROCIN 2 % EX OINT
1.0000 "application " | TOPICAL_OINTMENT | Freq: Two times a day (BID) | CUTANEOUS | Status: AC
  Administered 2012-07-08 – 2012-07-12 (×9): 1 via NASAL
  Filled 2012-07-07 (×2): qty 22

## 2012-07-07 MED ORDER — LACTATED RINGERS IV SOLN
INTRAVENOUS | Status: DC | PRN
  Administered 2012-07-07 (×3): via INTRAVENOUS

## 2012-07-07 MED ORDER — THROMBIN 20000 UNITS EX SOLR
CUTANEOUS | Status: DC | PRN
  Administered 2012-07-07: 07:00:00 via TOPICAL

## 2012-07-07 MED ORDER — SODIUM CHLORIDE 0.9 % IV SOLN
500.0000 mg | INTRAVENOUS | Status: DC | PRN
  Administered 2012-07-07: 10 ug/kg/min via INTRAVENOUS

## 2012-07-07 MED ORDER — LIDOCAINE-EPINEPHRINE 1 %-1:100000 IJ SOLN
INTRAMUSCULAR | Status: DC | PRN
  Administered 2012-07-07: 10 mL

## 2012-07-07 MED ORDER — GLIMEPIRIDE 4 MG PO TABS
8.0000 mg | ORAL_TABLET | Freq: Every day | ORAL | Status: DC
  Administered 2012-07-07 – 2012-07-21 (×15): 8 mg via ORAL
  Filled 2012-07-07 (×17): qty 2

## 2012-07-07 MED ORDER — BUPIVACAINE HCL (PF) 0.25 % IJ SOLN
INTRAMUSCULAR | Status: DC | PRN
  Administered 2012-07-07: 10 mL

## 2012-07-07 MED ORDER — 0.9 % SODIUM CHLORIDE (POUR BTL) OPTIME
TOPICAL | Status: DC | PRN
  Administered 2012-07-07: 1000 mL

## 2012-07-07 MED ORDER — MIDAZOLAM HCL 5 MG/5ML IJ SOLN
INTRAMUSCULAR | Status: DC | PRN
  Administered 2012-07-07: 2 mg via INTRAVENOUS

## 2012-07-07 MED ORDER — CLOBETASOL PROPIONATE 0.05 % EX FOAM: 1.0000 "application " | Freq: Every day | CUTANEOUS | Status: DC | PRN

## 2012-07-07 MED ORDER — SODIUM CHLORIDE 0.9 % IV SOLN
INTRAVENOUS | Status: AC
  Filled 2012-07-07: qty 500

## 2012-07-07 MED ORDER — ONDANSETRON HCL 4 MG/2ML IJ SOLN
INTRAMUSCULAR | Status: DC | PRN
  Administered 2012-07-07: 4 mg via INTRAVENOUS

## 2012-07-07 MED ORDER — SODIUM CHLORIDE 0.9 % IV SOLN: 250.0000 mL | INTRAVENOUS | Status: DC

## 2012-07-07 MED ORDER — LIDOCAINE HCL (CARDIAC) 20 MG/ML IV SOLN
INTRAVENOUS | Status: DC | PRN
  Administered 2012-07-07: 80 mg via INTRAVENOUS

## 2012-07-07 MED ORDER — HYDROCHLOROTHIAZIDE 25 MG PO TABS
25.0000 mg | ORAL_TABLET | Freq: Every day | ORAL | Status: DC
  Administered 2012-07-07 – 2012-07-22 (×14): 25 mg via ORAL
  Filled 2012-07-07 (×17): qty 1

## 2012-07-07 SURGICAL SUPPLY — 74 items
ADH SKN CLS APL DERMABOND .7 (GAUZE/BANDAGES/DRESSINGS) ×2
APL SKNCLS STERI-STRIP NONHPOA (GAUZE/BANDAGES/DRESSINGS) ×1
BAG DECANTER FOR FLEXI CONT (MISCELLANEOUS) ×2 IMPLANT
BENZOIN TINCTURE PRP APPL 2/3 (GAUZE/BANDAGES/DRESSINGS) ×2 IMPLANT
BLADE SURG 11 STRL SS (BLADE) ×2 IMPLANT
BLADE SURG ROTATE 9660 (MISCELLANEOUS) IMPLANT
BRUSH SCRUB EZ PLAIN DRY (MISCELLANEOUS) ×2 IMPLANT
BUR MATCHSTICK NEURO 3.0 LAGG (BURR) ×2 IMPLANT
BUR PRECISION FLUTE 6.0 (BURR) ×2 IMPLANT
CANISTER SUCTION 2500CC (MISCELLANEOUS) ×2 IMPLANT
CLOTH BEACON ORANGE TIMEOUT ST (SAFETY) ×2 IMPLANT
CONT SPEC 4OZ CLIKSEAL STRL BL (MISCELLANEOUS) ×4 IMPLANT
COVER BACK TABLE 24X17X13 BIG (DRAPES) IMPLANT
COVER TABLE BACK 60X90 (DRAPES) ×2 IMPLANT
DECANTER SPIKE VIAL GLASS SM (MISCELLANEOUS) ×2 IMPLANT
DERMABOND ADVANCED (GAUZE/BANDAGES/DRESSINGS) ×2
DERMABOND ADVANCED .7 DNX12 (GAUZE/BANDAGES/DRESSINGS) ×1 IMPLANT
DRAPE C-ARM 42X72 X-RAY (DRAPES) ×4 IMPLANT
DRAPE LAPAROTOMY 100X72X124 (DRAPES) ×2 IMPLANT
DRAPE POUCH INSTRU U-SHP 10X18 (DRAPES) ×2 IMPLANT
DRAPE PROXIMA HALF (DRAPES) IMPLANT
DRAPE SURG 17X23 STRL (DRAPES) ×2 IMPLANT
DRSG OPSITE 4X5.5 SM (GAUZE/BANDAGES/DRESSINGS) ×3 IMPLANT
DURAFORM COLLAGEN 1X1 5-PACK (Neuro Prosthesis/Implant) ×1 IMPLANT
DURASEAL SPINE SEALANT 3ML (MISCELLANEOUS) ×1 IMPLANT
ELECT REM PT RETURN 9FT ADLT (ELECTROSURGICAL) ×2
ELECTRODE REM PT RTRN 9FT ADLT (ELECTROSURGICAL) ×1 IMPLANT
EVACUATOR 3/16  PVC DRAIN (DRAIN) ×1
EVACUATOR 3/16 PVC DRAIN (DRAIN) ×1 IMPLANT
GAUZE SPONGE 4X4 16PLY XRAY LF (GAUZE/BANDAGES/DRESSINGS) IMPLANT
GLOVE BIO SURGEON STRL SZ7 (GLOVE) ×1 IMPLANT
GLOVE BIO SURGEON STRL SZ8 (GLOVE) ×4 IMPLANT
GLOVE ECLIPSE 7.5 STRL STRAW (GLOVE) IMPLANT
GLOVE EXAM NITRILE LRG STRL (GLOVE) ×1 IMPLANT
GLOVE EXAM NITRILE MD LF STRL (GLOVE) IMPLANT
GLOVE EXAM NITRILE XL STR (GLOVE) IMPLANT
GLOVE EXAM NITRILE XS STR PU (GLOVE) IMPLANT
GLOVE INDICATOR 7.0 STRL GRN (GLOVE) ×2 IMPLANT
GLOVE INDICATOR 7.5 STRL GRN (GLOVE) ×1 IMPLANT
GLOVE INDICATOR 8.5 STRL (GLOVE) ×4 IMPLANT
GLOVE SS BIOGEL STRL SZ 6.5 (GLOVE) IMPLANT
GLOVE SUPERSENSE BIOGEL SZ 6.5 (GLOVE) ×3
GOWN BRE IMP SLV AUR LG STRL (GOWN DISPOSABLE) ×2 IMPLANT
GOWN BRE IMP SLV AUR XL STRL (GOWN DISPOSABLE) ×4 IMPLANT
GOWN STRL REIN 2XL LVL4 (GOWN DISPOSABLE) ×1 IMPLANT
KIT BASIN OR (CUSTOM PROCEDURE TRAY) ×2 IMPLANT
KIT ROOM TURNOVER OR (KITS) ×2 IMPLANT
MILL MEDIUM DISP (BLADE) ×1 IMPLANT
MIX DBX 10CC 35% BONE (Bone Implant) ×1 IMPLANT
NDL HYPO 25X1 1.5 SAFETY (NEEDLE) ×1 IMPLANT
NEEDLE HYPO 25X1 1.5 SAFETY (NEEDLE) ×2 IMPLANT
NS IRRIG 1000ML POUR BTL (IV SOLUTION) ×2 IMPLANT
PACK LAMINECTOMY NEURO (CUSTOM PROCEDURE TRAY) ×2 IMPLANT
PAD ARMBOARD 7.5X6 YLW CONV (MISCELLANEOUS) ×6 IMPLANT
PATTIES SURGICAL .5 X.5 (GAUZE/BANDAGES/DRESSINGS) ×1 IMPLANT
ROD DANEK 40MM (Rod) ×2 IMPLANT
SCREW PEDICLE VA L635 5.5X45M (Screw) ×2 IMPLANT
SCREW SET NON BREAK OFF (Screw) ×4 IMPLANT
SPACER CALIBER 10X22MM 8-12MM (Spacer) ×2 IMPLANT
SPONGE GAUZE 4X4 12PLY (GAUZE/BANDAGES/DRESSINGS) ×2 IMPLANT
SPONGE LAP 4X18 X RAY DECT (DISPOSABLE) IMPLANT
SPONGE SURGIFOAM ABS GEL 100 (HEMOSTASIS) ×2 IMPLANT
STRIP CLOSURE SKIN 1/2X4 (GAUZE/BANDAGES/DRESSINGS) ×4 IMPLANT
SUT NURALON 4 0 TR CR/8 (SUTURE) ×1 IMPLANT
SUT PROLENE 6 0 BV (SUTURE) ×1 IMPLANT
SUT VIC AB 0 CT1 18XCR BRD8 (SUTURE) ×2 IMPLANT
SUT VIC AB 0 CT1 8-18 (SUTURE) ×6
SUT VIC AB 2-0 CT1 18 (SUTURE) ×2 IMPLANT
SUT VICRYL 4-0 PS2 18IN ABS (SUTURE) ×2 IMPLANT
SYR 20ML ECCENTRIC (SYRINGE) ×2 IMPLANT
TOWEL OR 17X24 6PK STRL BLUE (TOWEL DISPOSABLE) ×2 IMPLANT
TOWEL OR 17X26 10 PK STRL BLUE (TOWEL DISPOSABLE) ×2 IMPLANT
TRAY FOLEY CATH 14FRSI W/METER (CATHETERS) ×2 IMPLANT
WATER STERILE IRR 1000ML POUR (IV SOLUTION) ×2 IMPLANT

## 2012-07-07 NOTE — Anesthesia Preprocedure Evaluation (Addendum)
Anesthesia Evaluation  Patient identified by MRN, date of birth, ID band Patient awake    Reviewed: Allergy & Precautions, H&P , NPO status , Patient's Chart, lab work & pertinent test results  History of Anesthesia Complications (+) PONV  Airway Mallampati: II TM Distance: >3 FB Neck ROM: Full    Dental  (+) Dental Advisory Given and Caps   Pulmonary asthma , sleep apnea and Continuous Positive Airway Pressure Ventilation ,    Pulmonary exam normal       Cardiovascular hypertension,     Neuro/Psych PSYCHIATRIC DISORDERS Anxiety Depression    GI/Hepatic Neg liver ROS, GERD-  Medicated,  Endo/Other  diabetes  Renal/GU Renal InsufficiencyRenal disease     Musculoskeletal   Abdominal   Peds  Hematology negative hematology ROS (+)   Anesthesia Other Findings   Reproductive/Obstetrics                          Anesthesia Physical Anesthesia Plan  ASA: III  Anesthesia Plan: General   Post-op Pain Management:    Induction: Intravenous  Airway Management Planned: Oral ETT  Additional Equipment:   Intra-op Plan:   Post-operative Plan: Extubation in OR  Informed Consent: I have reviewed the patients History and Physical, chart, labs and discussed the procedure including the risks, benefits and alternatives for the proposed anesthesia with the patient or authorized representative who has indicated his/her understanding and acceptance.   Dental advisory given  Plan Discussed with: CRNA, Anesthesiologist and Surgeon  Anesthesia Plan Comments:         Anesthesia Quick Evaluation

## 2012-07-07 NOTE — Transfer of Care (Signed)
Immediate Anesthesia Transfer of Care Note  Patient: [name]  Procedure(s) Performed: Procedure(s) with comments: POSTERIOR LUMBAR FUSION 1 WITH HARDWARE REMOVAL (N/A) - Posterior lumbar fusion one-two with hardware removal lumbar two-four  Patient Location: PACU  Anesthesia Type:General  Level of Consciousness: sedated  Airway & Oxygen Therapy: Patient Spontanous Breathing and Patient connected to face mask oxygen  Post-op Assessment: Report given to PACU RN and Post -op Vital signs reviewed and stable  Post vital signs: Reviewed and stable  Complications: No apparent anesthesia complications

## 2012-07-07 NOTE — Progress Notes (Signed)
ANTIBIOTIC CONSULT NOTE - INITIAL  Pharmacy Consult:  Vancomycin Indication:   Surgical prophylaxis  Allergies  Allergen Reactions  . Victoza (Liraglutide)     Severe rash and hives.  Marland Kitchen Ultram (Tramadol) Rash  . Betadine (Povidone Iodine) Other (See Comments)    Skin peels  . Codeine     Severe headaches  . Contrast Media (Iodinated Diagnostic Agents) Nausea And Vomiting  . Hydrocodone Other (See Comments)    headache  . Iodine     Iodine put on for surgery  . Januvia (Sitagliptin) Diarrhea  . Morphine And Related Nausea And Vomiting  . Percocet (Oxycodone-Acetaminophen) Other (See Comments)    Headache   . Septra (Sulfamethoxazole W-Trimethoprim)     rash  . Sulfa Antibiotics Itching  . Valium Other (See Comments)    headache  . Vicodin (Hydrocodone-Acetaminophen) Other (See Comments)    headache  . Amoxicillin Hives and Rash  . Darvocet (Propoxyphene-Acetaminophen) Rash and Other (See Comments)    headache  . Fentanyl And Related Rash  . Wellbutrin (Bupropion Hcl) Rash    Patient Measurements: Height: 5\' 3"  (160 cm) Weight: 216 lb 7.9 oz (98.2 kg) IBW/kg (Calculated) : 52.4  Vital Signs: Temp: 98.3 F (36.8 C) (03/03 1247) Temp src: Oral (03/03 0624) BP: 131/76 mmHg (03/03 0624) Pulse Rate: 96 (03/03 1200) Intake/Output from this shift: Total I/O In: 2700 [I.V.:2700] Out: 500 [Urine:200; Blood:300]  Labs: No results found for this basename: WBC, HGB, PLT, LABCREA, CREATININE,  in the last 72 hours Estimated Creatinine Clearance: 57.3 ml/min (by C-G formula based on Cr of 1.18). No results found for this basename: VANCOTROUGH, VANCOPEAK, VANCORANDOM, GENTTROUGH, GENTPEAK, GENTRANDOM, TOBRATROUGH, TOBRAPEAK, TOBRARND, AMIKACINPEAK, AMIKACINTROU, AMIKACIN,  in the last 72 hours   Microbiology: No results found for this or any previous visit (from the past 720 hour(s)).  Medical History: Past Medical History  Diagnosis Date  . Anemia in chronic renal  disease 06/05/2011  . Psoriasis 06/05/2011    on legs and uses cream prn  . DDD (degenerative disc disease), cervical 06/05/2011  . DDD (degenerative disc disease), lumbosacral 06/05/2011  . PONV (postoperative nausea and vomiting)     hard to wake up  . HTN (hypertension) 06/05/2011    takes Prinizide daily  . Hyperlipidemia     takes Zocor daily  . Asthma     has an inhaler prn  . Cough   . Seasonal allergies     but doesn't take any meds  . History of bronchitis     last time 33yrs ago  . Headache     occasionally  . Dizziness   . Diabetic neuropathy   . Sleep apnea     uses CPAP  . GERD (gastroesophageal reflux disease)     takes Protonix prn  . Diarrhea   . History of colon polyps   . Diabetes mellitus     takes Amaryl and Metformin daily  . Depression     takes Cymbalta daily  . Anxiety     takes Ativan tid  . Insomnia     not on any meds at present time  . History of MRSA infection         Assessment: 35 YOF s/p posterior lumbar fusion with hardware removal to received vancomycin for post-surgical prophylaxis.  Noted patient received vancomycin 1gm IV this AM around 0730.  SCr 1.18, CrCL ~57 ml/min.  Verified with MD, patient to receive vancomycin for 48 hrs post-op.   Goal of  Therapy:  Infection prevention   Plan:  - Vanc 750mg  IV Q12H x 4 doses - Monitor renal function and adjust vanc as indicated     Thuy D. Laney Potash, PharmD, BCPS Pager:  [phone] 07/07/2012, 1:21 PM

## 2012-07-07 NOTE — Anesthesia Postprocedure Evaluation (Signed)
Anesthesia Post Note  Patient: [name]  Procedure(s) Performed: Procedure(s) (LRB): POSTERIOR LUMBAR FUSION 1 WITH HARDWARE REMOVAL (N/A)  Anesthesia type: general  Patient location: PACU  Post pain: Pain level controlled  Post assessment: Patient's Cardiovascular Status Stable  Last Vitals:  Filed Vitals:   07/07/12 1247  BP:   Pulse:   Temp: 36.8 C  Resp:     Post vital signs: Reviewed and stable  Level of consciousness: sedated  Complications: No apparent anesthesia complications

## 2012-07-07 NOTE — Progress Notes (Signed)
Dr singer said he would sign pt out.

## 2012-07-07 NOTE — Progress Notes (Signed)
Placed pt on CPAP via nasal prongs (pt home mask and tubing) home setting of 15.0 cm H20 with 2.5 O2 bleed in.  Pt. Tolerating well. RN aware.

## 2012-07-07 NOTE — Clinical Social Work Note (Signed)
Clinical Social Work   CSW received consult for SNF. CSW reviewed chart. Per chart review, pt had surgery today. Awaiting PT/OT evals for discharge recommendations which are needed for prior auth for SNF placement. CSW will assess for SNF, if appropriate. Please call with any urgent needs. CSW will continue to follow.   Dede Query, MSW, Theresia Majors [phone]

## 2012-07-07 NOTE — H&P (Signed)
[name] is an 62 y.o. female.   Chief Complaint: Back and bilateral hip and leg pain HPI: Patient is a very pleasant 62 year old female who has had long-standing history with her back in her neck over last several weeks and months of progressive worsening back pain and radiating to both hips as well as in sizable thighs consistent with an L2 nerve root pattern. Workup has shown progressive breakdown at L1 to above her previous L2-L4 fusion with marked vacuum phenomena disc space disc degeneration lateral recess stenosis and scoliotic deformity. Patient has failed all forms of conservative treatment with physical therapy epidural steroid injections and time and now presents for reexploration of fusion removal of hardware L2-L4 and posterior lumbar interbody fusion L1-2. I extensively went over the risks and benefits of the operation with the patient as well as perioperative course and expectations of outcome and alternatives to surgery and she understands and agrees to proceed forward.  Past Medical History  Diagnosis Date  . Anemia in chronic renal disease 06/05/2011  . Psoriasis 06/05/2011    on legs and uses cream prn  . DDD (degenerative disc disease), cervical 06/05/2011  . DDD (degenerative disc disease), lumbosacral 06/05/2011  . PONV (postoperative nausea and vomiting)     hard to wake up  . HTN (hypertension) 06/05/2011    takes Prinizide daily  . Hyperlipidemia     takes Zocor daily  . Asthma     has an inhaler prn  . Cough   . Seasonal allergies     but doesn't take any meds  . History of bronchitis     last time 30yrs ago  . Headache     occasionally  . Dizziness   . Diabetic neuropathy   . Sleep apnea     uses CPAP  . GERD (gastroesophageal reflux disease)     takes Protonix prn  . Diarrhea   . History of colon polyps   . Diabetes mellitus     takes Amaryl and Metformin daily  . Depression     takes Cymbalta daily  . Anxiety     takes Ativan tid  . Insomnia    not on any meds at present time  . History of MRSA infection     Past Surgical History  Procedure Laterality Date  . Elbow surgery      left d/t tendonitis  . Amputation  02/12/2012    Procedure: AMPUTATION RAY;  Surgeon: Toni Arthurs, MD;  Location: [facility] OR;  Service: Orthopedics;  Laterality: Right;  RIGHT 2ND TOE AMPUTATION   . Cholecystectomy  1977  . Abdominal hysterectomy  1982 and 1984  . Right knee arthroscopy      x 2  . Back surgery      x 2  . Shoulder arthroscopy    . Cervical spine surgery    . Dilation and curettage of uterus    . Colonoscopy    . Esophagogastroduodenoscopy    . Cardiac catheterization      08/09/04: 50% mid AV groove CX lesion, NL LM, LAD, Ramus, EF > 60% (Dr. Allyson Sabal)    No family history on file. Social History:  reports that she has never smoked. She does not have any smokeless tobacco history on file. She reports that she does not drink alcohol or use illicit drugs.  Allergies:  Allergies  Allergen Reactions  . Victoza (Liraglutide)     Severe rash and hives.  Marland Kitchen Ultram (Tramadol) Rash  . Betadine (  Povidone Iodine) Other (See Comments)    Skin peels  . Codeine     Severe headaches  . Contrast Media (Iodinated Diagnostic Agents) Nausea And Vomiting  . Hydrocodone Other (See Comments)    headache  . Iodine     Iodine put on for surgery  . Januvia (Sitagliptin) Diarrhea  . Morphine And Related Nausea And Vomiting  . Percocet (Oxycodone-Acetaminophen) Other (See Comments)    Headache   . Septra (Sulfamethoxazole W-Trimethoprim)     rash  . Sulfa Antibiotics Itching  . Valium Other (See Comments)    headache  . Vicodin (Hydrocodone-Acetaminophen) Other (See Comments)    headache  . Amoxicillin Hives and Rash  . Darvocet (Propoxyphene-Acetaminophen) Rash and Other (See Comments)    headache  . Fentanyl And Related Rash  . Wellbutrin (Bupropion Hcl) Rash    Medications Prior to Admission  Medication Sig Dispense Refill  . albuterol  (PROVENTIL HFA;VENTOLIN HFA) 108 (90 BASE) MCG/ACT inhaler Inhale 1 puff into the lungs every 4 (four) hours as needed for shortness of breath.      Marland Kitchen aspirin 81 MG tablet Take 81 mg by mouth daily.      . Biotin 5000 MCG CAPS Take 5,000 mcg by mouth daily.      . clobetasol (OLUX) 0.05 % topical foam Apply 1 application topically daily as needed (apply to head for dry scalp).       . DULoxetine (CYMBALTA) 60 MG capsule Take 60 mg by mouth daily.      Marland Kitchen glimepiride (AMARYL) 4 MG tablet Take 8 mg by mouth daily after supper.       Marland Kitchen GREEN COFFEE BEAN PO Take 1 tablet by mouth daily.      Marland Kitchen HYDROmorphone (DILAUDID) 2 MG tablet Take 2 mg by mouth every 4 (four) hours as needed for pain.      Marland Kitchen lisinopril-hydrochlorothiazide (PRINZIDE,ZESTORETIC) 20-25 MG per tablet Take 1 tablet by mouth daily.      Marland Kitchen LORazepam (ATIVAN) 1 MG tablet Take 1 mg by mouth 3 (three) times daily.       . meperidine (DEMEROL) 50 MG tablet Take 50 mg by mouth 2 (two) times daily as needed for pain.       . metFORMIN (GLUCOPHAGE) 1000 MG tablet Take 1,000 mg by mouth 2 (two) times daily with a meal.      . Multiple Vitamin (MULTI-VITAMIN DAILY PO) Take 1 tablet by mouth daily.       . pantoprazole (PROTONIX) 40 MG tablet Take 40 mg by mouth daily as needed (for reflux). For acid reflux      . simvastatin (ZOCOR) 40 MG tablet Take 40 mg by mouth at bedtime.         Results for orders placed during the hospital encounter of 07/07/12 (from the past 48 hour(s))  GLUCOSE, CAPILLARY     Status: Abnormal   Collection Time    07/07/12  6:25 AM      Result Value Range   Glucose-Capillary 130 (*) 70 - 99 mg/dL   No results found.  Review of Systems  Constitutional: Negative.   HENT: Positive for neck pain.   Eyes: Negative.   Respiratory: Negative.   Cardiovascular: Negative.   Gastrointestinal: Negative.   Genitourinary: Negative.   Musculoskeletal: Positive for myalgias, back pain, joint pain and falls.  Skin:  Negative.   Neurological: Positive for tingling.  Psychiatric/Behavioral: Negative.     Blood pressure 131/76, pulse 85, temperature 98.6  F (37 C), temperature source Oral, resp. rate 20, SpO2 97.00%. Physical Exam  Constitutional: She is oriented to person, place, and time. She appears well-developed and well-nourished.  HENT:  Head: Normocephalic.  Eyes: Pupils are equal, round, and reactive to light.  Neck: Normal range of motion.  Cardiovascular: Normal rate.   Respiratory: Effort normal.  GI: Soft.  Neurological: She is alert and oriented to person, place, and time. She has normal strength. GCS eye subscore is 4. GCS verbal subscore is 5. GCS motor subscore is 6.  Reflex Scores:      Brachioradialis reflexes are 0 on the right side and 0 on the left side.      Patellar reflexes are 0 on the right side and 0 on the left side.      Achilles reflexes are 0 on the right side and 0 on the left side. Strength is 5 out of 5 in her iliopsoas, quads, hamstrings, gastrocs, anterior tibialis, and EHL.  Skin: Skin is warm and dry.     Assessment/Plan 62 year old female presents for a posterior lumbar interbody fusion L1-2 and exploration of fusion removal of hardware L2-L4  CRAM,GARY P 07/07/2012, 7:12 AM

## 2012-07-07 NOTE — Op Note (Signed)
Preoperative diagnosis: Degenerative disc disease L1-2,  lumbar spinal stenosis L1-2 with severe stenosis of the L1 and L2 nerve root bilateral L1-2 radiculopathy and mechanical back pain  Postoperative diagnosis: Same  Procedure: Decompressive lumbar laminectomy L1-2 in the excess will be needed with a standard interbody fusion with radical foraminotomies of the L1 and L2 nerve roots bilaterally and complete facetectomies  #2 posterior lumbar interbody fusion L1-2  #3 pedicle screw fixation L1-2 using Medtronic legacy 6.35 pedicle to system  #4 exploration of fusion removal of hardware L2-L4 with removal of the L3 and L4 pedicle screws  #5 posterior lateral arthrodesis L1-2 using local autograft mixed DBX  #6 repair of inadvertent dural tear  Surgeon: Jillyn Hidden Carmelina Balducci  Assistant: Barnett Abu  Anesthesia: Gen.  EBL: 300  History of present illness: Patient is a very pleasant 62 year old female whose had long-standing issues with her back undergone previous lumbar fusions from L2-L4 in previous operations. She presented with progressive worsening back and bilateral leg pain rating to her hips the insides of her groin and down the inside of her thigh consistent with an L1 and L2 nerve root pattern workup a CT scan MRI scan showed progressive degenerative breakdown at L1-2 with severe stenosis of the L1 and L2 nerve roots and marked degenerative disc disease with kyphosis due to patient's failure conservative treatment progression of clinical syndrome and imaging findings I recommended a reexploration of fusion removal of hardware decompression at L1-2 with decompression of the L1 and L2 nerve roots foraminotomies facetectomies and posterior lumbar in by fusion at this level risks and benefits of the operation as well as therapy course expectations about alternatives of surgery were all spine the patient she agreed to proceed forward.  Operative procedure: Patient brought into the or was induced  under general anesthesia and positioned prone the Wilson frame her back was prepped and draped in sterile fashion a rolled incision was partially opened up extending cephalad subperiosteal dissections care lamina of L1 and L2 exposed the hardware from L2-L4 the fusion did appear to be solid so this hardware was disconnected nuts removed the cross-link was removed and the rod was removed all the pedicles were then waxed the L2 screws were left intact. Then the spinous process at L1 was removed central decompression was begun there was a dense amount of scar tissue at the inferior aspect of the of the L1-1 disc space level from the previous surgery at L2-3 so work the scar tissue centigrays was completed complete medial facetectomies were performed with extensive foraminotomies performed at L1 and L2 this allowed access lateral margins of the disc space there was a large spur coming off the lamina on the left at L2 this was densely adherent to the dura and the reduction no intact underlying dura under the spur. That she's the spur off of the thecal sac with a dental dissector a large dural tear was created so I decompress the L2 nerve root and elected not to remove the entirety of the spur secondary to the lack of intact dura underneath and so the muscle patch with Nurolon the overlying it to stop the CSF egress. At this point after complete decompression been achieved in the L1-L2 nerve root directly decompressed attention second pedicle screw placement using a high-speed drill pilot holes were drilled at L1 cannulated with the awl probed With a 4.5 tap to probed again and a 5 5 x 45 screw inserted at L1 bilaterally. After fluoroscopy confirmed good position of screws  at this taken the interbody work the space was incised cleanout radically from both sides sequential distraction was carried out to open up the disc space weaving in a distractor on the right side the spaces and cleanout the right using a 7 rotating  cutter and Epstein curettes endplates were prepared in a cage was then packed and inserted this was opened up to approximately 9 mm this had good apposition the endplates of of the appropriate sizing for the cage in significantly opened up the disc space and decompress the L1-L2 nerve root on the left. 10 distractor was removed the right side and endplates were prepared in a similar fashion local autograft was packed centrally and right-sided cage was inserted again fluoroscopy used in some way confirm Deppen trajectory after adequate placement implants fluoroscopy confirmed good position in AP and lateral projection then the wound was copiously are good fixing space was maintained aggressive decortication was care MTPs or lateral gutters day and local are graft pack posterior laterally Gelfoam was laid over the foramen after confirming wide patency 50 mm rods were placed all tightness were tightened down into place then the M. foramina were reinspected the muscle patch was then coated with DuraSeal piece of DuraGen and more DuraSeal. Gelfoam was overlaid over the dura otherwise the then the wounds closed in layers with after Vicryl and the skin was closed running 4 septic or benzoin and Steri-Strips were applied patient recovered in stable condition. At the end of case on it counts and sponge counts were correct.

## 2012-07-07 NOTE — Preoperative (Signed)
Beta Blockers   Reason not to administer Beta Blockers:Not Applicable 

## 2012-07-07 NOTE — Anesthesia Procedure Notes (Signed)
Procedure Name: Intubation Date/Time: 07/07/2012 7:39 AM Performed by: Armandina Gemma Pre-anesthesia Checklist: Patient identified, Emergency Drugs available, Suction available, Patient being monitored and Timeout performed Patient Re-evaluated:Patient Re-evaluated prior to inductionOxygen Delivery Method: Circle system utilized Preoxygenation: Pre-oxygenation with 100% oxygen Intubation Type: IV induction Ventilation: Mask ventilation without difficulty Laryngoscope Size: Miller and 2 Tube type: Oral Tube size: 7.0 mm Number of attempts: 1 (atraumatic teeth and mouth as preop- bilat BS Singer MD) Airway Equipment and Method: Stylet Placement Confirmation: ETT inserted through vocal cords under direct vision and positive ETCO2 Secured at: 22 cm Tube secured with: Tape Dental Injury: Teeth and Oropharynx as per pre-operative assessment

## 2012-07-08 LAB — BASIC METABOLIC PANEL
Calcium: 7.9 mg/dL — ABNORMAL LOW (ref 8.4–10.5)
GFR calc Af Amer: 55 mL/min — ABNORMAL LOW (ref >90)
GFR calc non Af Amer: 48 mL/min — ABNORMAL LOW (ref >90)
Sodium: 135 mEq/L (ref 135–145)

## 2012-07-08 LAB — GLUCOSE, CAPILLARY
Glucose-Capillary: 174 mg/dL — ABNORMAL HIGH (ref 70–99)
Glucose-Capillary: 214 mg/dL — ABNORMAL HIGH (ref 70–99)
Glucose-Capillary: 170 mg/dL — ABNORMAL HIGH (ref 70–99)

## 2012-07-08 MED ORDER — SODIUM CHLORIDE 0.9 % IV SOLN
Freq: Once | INTRAVENOUS | Status: DC
  Administered 2012-07-08: 16:00:00 via INTRAVENOUS

## 2012-07-08 MED ORDER — PANTOPRAZOLE SODIUM 40 MG PO TBEC
40.0000 mg | DELAYED_RELEASE_TABLET | Freq: Every day | ORAL | Status: DC
  Administered 2012-07-08 – 2012-07-22 (×14): 40 mg via ORAL
  Filled 2012-07-08 (×6): qty 1

## 2012-07-08 NOTE — Progress Notes (Deleted)
Placed patient on CPAP of 15 with her home mask (nasal prongs) and filled water back to max fill line. Patient tolerating well, RT will continue to monitor.

## 2012-07-08 NOTE — Progress Notes (Signed)
PT/OT Cancellation Note  Patient Details Name: [name] MRN: [id number] DOB: Jul 07, 1950   Cancelled Treatment:    Reason Eval/Treat Not Completed: Patient not medically ready. Pt remains to be on bed rest per Dr. Wynetta Emery note for today. Will begin to mobilize tomorrow per MD.   Marcene Brawn 07/08/2012, 2:11 PM  Lewis Shock, PT, DPT Pager #: [phone] Office #: [phone]

## 2012-07-08 NOTE — Progress Notes (Signed)
Placed patient on CPAP of 15 with her home mask (nasal pillows) and filled water back to max fill line. Patient tolerating well, RT will continue to monitor.

## 2012-07-08 NOTE — Progress Notes (Signed)
Subjective: Patient reports She's doing okay just feels that she is a bowel movement although she denies passing gas she denies any leg pain or any numbness and tingling or legs. She states the pain medication patient appears to be working.  Objective: Vital signs in last 24 hours: Temp:  [98 F (36.7 C)-100.8 F (38.2 C)] 98 F (36.7 C) (03/04 1012) Pulse Rate:  [80-114] 114 (03/04 1012) Resp:  [14-20] 16 (03/04 1012) BP: (87-132)/(35-62) 122/62 mmHg (03/04 1012) SpO2:  [83 %-98 %] 91 % (03/04 1012) Weight:  [98.2 kg (216 lb 7.9 oz)-99.9 kg (220 lb 3.8 oz)] 99.9 kg (220 lb 3.8 oz) (03/03 1312)  Intake/Output from previous day: 03/03 0701 - 03/04 0700 In: 3035 [I.V.:3035] Out: 500 [Urine:200; Blood:300] Intake/Output this shift:    Strength is 5 out of 5 wound is clean and dry  Lab Results: No results found for this basename: WBC, HGB, HCT, PLT,  in the last 72 hours BMET  Recent Labs  07/08/12 0645  NA 135  K 4.8  CL 100  CO2 24  GLUCOSE 187*  BUN 25*  CREATININE 1.22*  CALCIUM 7.9*    Studies/Results: Dg Lumbar Spine 2-3 Views  07/07/2012  *RADIOLOGY REPORT*  Clinical Data: Lumbar fusion hardware removal  DG C-ARM 1-60 MIN,LUMBAR SPINE - 2-3 VIEW  Comparison: MR 09/08/2006  Findings: Two fluoroscopic spot images document bilateral pedicle screw placement at L1 and L2 with graft devices projecting in the L1-2 interspace.  Graft markers at L2-3 and L3-4 are noted.  IMPRESSION:  1.  P L I F L1-2   Original Report Authenticated By: D. Andria Rhein, MD    Dg C-arm 1-60 Min  07/07/2012  *RADIOLOGY REPORT*  Clinical Data: Lumbar fusion hardware removal  DG C-ARM 1-60 MIN,LUMBAR SPINE - 2-3 VIEW  Comparison: MR 09/08/2006  Findings: Two fluoroscopic spot images document bilateral pedicle screw placement at L1 and L2 with graft devices projecting in the L1-2 interspace.  Graft markers at L2-3 and L3-4 are noted.  IMPRESSION:  1.  P L I F L1-2   Original Report Authenticated By: D.  Andria Rhein, MD     Assessment/Plan: Posterior they want to plate with a CSF leak continued flat bedrest 1 more day slowly start to mobilize or tomorrow she did have some episodes of hypotension overnight her electrolytes look okay with a creatinine 1.2 to sodium 135 will bolus her a liter of saline  LOS: 1 day     CRAM,GARY P 07/08/2012, 10:14 AM

## 2012-07-08 NOTE — Progress Notes (Signed)
I agree with the following treatment note after reviewing documentation.   Johnston, Tarina Volk Brynn   OTR/L Pager: [phone] Office: [phone] .   

## 2012-07-08 NOTE — Plan of Care (Signed)
Problem: Phase I Progression Outcomes Goal: OOB as tolerated unless otherwise ordered Outcome: Not Progressing Bed rest with HOB flat until tomorrow am

## 2012-07-08 NOTE — Progress Notes (Signed)
Inpatient Diabetes Program Recommendations  AACE/ADA: New Consensus Statement on Inpatient Glycemic Control (2013)  Target Ranges:  Prepandial:   less than 140 mg/dL      Peak postprandial:   less than 180 mg/dL (1-2 hours)      Critically ill patients:  140 - 180 mg/dL  Results for HAVA, [place] (MRN [id number]) as of 07/08/2012 12:56  Ref. Range 07/07/2012 12:05 07/07/2012 15:03 07/07/2012 16:39 07/08/2012 06:52 07/08/2012 11:30  Glucose-Capillary Latest Range: 70-99 mg/dL 621 (H) 308 (H) 657 (H) 174 (H) 239 (H)   Inpatient Diabetes Program Recommendations Correction (SSI): start MODERATE scale TID + HS per Glycemic Control Order Set Thank you  Piedad Climes BSN, RN,CDE Inpatient Diabetes Coordinator [phone] (team pager)

## 2012-07-09 ENCOUNTER — Inpatient Hospital Stay (HOSPITAL_COMMUNITY): Payer: BC Managed Care – PPO

## 2012-07-09 LAB — GLUCOSE, CAPILLARY: Glucose-Capillary: 177 mg/dL — ABNORMAL HIGH (ref 70–99)

## 2012-07-09 NOTE — Progress Notes (Signed)
Pt. With temp 101.4; already had Tylenol >1 hour ago. Dr. Danielle Dess notified; new orders received. Will monitor.

## 2012-07-09 NOTE — Progress Notes (Signed)
Inpatient Diabetes Program Recommendations  AACE/ADA: New Consensus Statement on Inpatient Glycemic Control (2013)  Target Ranges:  Prepandial:   less than 140 mg/dL      Peak postprandial:   less than 180 mg/dL (1-2 hours)      Critically ill patients:  140 - 180 mg/dL   Inpatient Diabetes Program Recommendations Correction (SSI): start MODERATE scale TID + HS per Glycemic Control Order Set Thank you  Piedad Climes BSN, RN,CDE Inpatient Diabetes Coordinator [phone] (team pager)

## 2012-07-09 NOTE — Clinical Social Work Note (Signed)
Clinical Social Work   CSW recviewed chart and discussed pt with RN during progression. Upon chart review, PT is not recommending any PT follow up. CSW updated RNCM. CSW is signing off as no further needs identified. Please reconsult if a needed arises prior to discharge.   Dede Query, MSW, Theresia Majors [phone]

## 2012-07-09 NOTE — Progress Notes (Signed)
07/09/2012 1500 No NCM needs identified. Isidoro Donning RN CCM Case Mgmt phone [phone]

## 2012-07-09 NOTE — Evaluation (Signed)
Physical Therapy Evaluation Patient Details Name: [name] MRN: [id number] DOB: 05/13/1950 Today's Date: 07/09/2012 Time: 4098-1191 PT Time Calculation (min): 37 min  PT Assessment / Plan / Recommendation Clinical Impression  Pt is a 62 yo female s/p PLIF mobilizing extremely well for first time OOB. Anticipate pt to be safe for d/c home with spouse when medically approved. PT to acutely follow to address mentioned deficits below.    PT Assessment  Patient needs continued PT services    Follow Up Recommendations  No PT follow up;Supervision - Intermittent    Does the patient have the potential to tolerate intense rehabilitation      Barriers to Discharge None      Equipment Recommendations  None recommended by PT (pt has access to all equip)    Recommendations for Other Services     Frequency Min 5X/week    Precautions / Restrictions Precautions Precautions: Back;Fall Precaution Booklet Issued: Yes (comment) Precaution Comments: Reviewed back precautions. Required Braces or Orthoses: Spinal Brace Spinal Brace: Lumbar corset;Applied in sitting position Restrictions Weight Bearing Restrictions: No   Pertinent Vitals/Pain 3/10 surgical back pain      Mobility  Bed Mobility Bed Mobility: Rolling Right;Right Sidelying to Sit Rolling Right: 4: Min assist;With rail Right Sidelying to Sit: 4: Min assist;HOB flat;With rails Details for Bed Mobility Assistance: v/c's for logroll technique Transfers Transfers: Sit to Stand;Stand to Sit Sit to Stand: 4: Min guard;With upper extremity assist;From bed;From chair/3-in-1 Stand to Sit: 4: Min guard;With upper extremity assist;To chair/3-in-1 Details for Transfer Assistance: verbal cues for hand placement Ambulation/Gait Ambulation/Gait Assistance: 4: Min guard Ambulation Distance (Feet): 100 Feet Assistive device: Rolling walker Ambulation/Gait Assistance Details: pt guarded. no LOB, good posture, safe use of RW Gait  Pattern: Within Functional Limits;Step-through pattern Gait velocity: decr Stairs: No    Exercises     PT Diagnosis: Difficulty walking  PT Problem List: Decreased activity tolerance;Decreased mobility PT Treatment Interventions: DME instruction;Gait training;Stair training;Functional mobility training;Therapeutic activities;Therapeutic exercise   PT Goals Acute Rehab PT Goals PT Goal Formulation: With patient Time For Goal Achievement: 07/16/12 Potential to Achieve Goals: Good Pt will Roll Supine to Right Side: with min assist;with modified independence PT Goal: Rolling Supine to Right Side - Progress: Goal set today Pt will go Supine/Side to Sit: with modified independence PT Goal: Supine/Side to Sit - Progress: Goal set today Pt will go Sit to Supine/Side: with modified independence;with HOB 0 degrees PT Goal: Sit to Supine/Side - Progress: Goal set today Pt will Ambulate: >150 feet;with modified independence;with rolling walker PT Goal: Ambulate - Progress: Goal set today Pt will Go Up / Down Stairs: 3-5 stairs;with min assist (assist of spouse) PT Goal: Up/Down Stairs - Progress: Goal set today Additional Goals Additional Goal #1: Pt independent with recall of back precautions and 100% compliant PT Goal: Additional Goal #1 - Progress: Goal set today  Visit Information  Last PT Received On: 07/09/12 Assistance Needed: +1    Subjective Data  Subjective: Pt received supine in bed with report "I need to have BM." Patient Stated Goal: home   Prior Functioning  Home Living Lives With: Spouse Available Help at Discharge: Family;Available 24 hours/day Type of Home: House Home Access: Stairs to enter [employer] of Steps: 2 Entrance Stairs-Rails: None Home Layout: One level Bathroom Shower/Tub: Tub/shower unit;Walk-in shower Bathroom Toilet: Standard Bathroom Accessibility: Yes How Accessible: Accessible via walker Home Adaptive Equipment: Walker -  rolling;Shower chair with back;Bedside commode/3-in-1;Reacher Prior Function Level of Independence:  Independent Able to Take Stairs?: Yes Driving: Yes Vocation: Retired Musician: No difficulties Dominant Hand: Right    Cognition  Cognition Overall Cognitive Status: Appears within functional limits for tasks assessed/performed Arousal/Alertness: Awake/alert Orientation Level: Oriented X4 / Intact Behavior During Session: WFL for tasks performed    Extremity/Trunk Assessment Right Upper Extremity Assessment RUE ROM/Strength/Tone: WFL for tasks assessed RUE Sensation: WFL - Light Touch RUE Coordination: WFL - gross/fine motor Left Upper Extremity Assessment LUE ROM/Strength/Tone: WFL for tasks assessed LUE Sensation: WFL - Light Touch LUE Coordination: WFL - gross/fine motor Right Lower Extremity Assessment RLE ROM/Strength/Tone: WFL for tasks assessed RLE Sensation: WFL - Light Touch Left Lower Extremity Assessment LLE ROM/Strength/Tone: WFL for tasks assessed LLE Sensation: WFL - Light Touch Trunk Assessment Trunk Assessment: Normal   Balance    End of Session PT - End of Session Equipment Utilized During Treatment: Gait belt;Back brace Activity Tolerance: Patient tolerated treatment well Patient left: in chair;with call bell/phone within reach;with family/visitor present Nurse Communication: Mobility status  GP     Marcene Brawn 07/09/2012, 10:49 AM  Lewis Shock, PT, DPT Pager #: [phone] Office #: [phone]

## 2012-07-09 NOTE — Progress Notes (Signed)
Subjective: Patient reports Patient states yesterday was rough. The need for bowel movement constipated. Feels comfortable sitting at 30 head of bed raised without headache. Anxious to be mobilized.  Objective: Vital signs in last 24 hours: Temp:  [98 F (36.7 C)-100.6 F (38.1 C)] 100.4 F (38 C) (03/05 1610) Pulse Rate:  [60-114] 109 (03/05 0608) Resp:  [16-18] 18 (03/05 0608) BP: (116-140)/(43-70) 137/57 mmHg (03/05 0608) SpO2:  [90 %-99 %] 94 % (03/05 0608)  Intake/Output from previous day: 03/04 0701 - 03/05 0700 In: 100 [P.O.:100] Out: 1300 [Urine:1300] Intake/Output this shift:    Dressing is intact flat without significant edema appears dry underneath transparent dressing. Motor function is good in lower extremities.  Lab Results: No results found for this basename: WBC, HGB, HCT, PLT,  in the last 72 hours BMET  Recent Labs  07/08/12 0645  NA 135  K 4.8  CL 100  CO2 24  GLUCOSE 187*  BUN 25*  CREATININE 1.22*  CALCIUM 7.9*    Studies/Results: Dg Lumbar Spine 2-3 Views  07/07/2012  *RADIOLOGY REPORT*  Clinical Data: Lumbar fusion hardware removal  DG C-ARM 1-60 MIN,LUMBAR SPINE - 2-3 VIEW  Comparison: MR 09/08/2006  Findings: Two fluoroscopic spot images document bilateral pedicle screw placement at L1 and L2 with graft devices projecting in the L1-2 interspace.  Graft markers at L2-3 and L3-4 are noted.  IMPRESSION:  1.  P L I F L1-2   Original Report Authenticated By: D. Andria Rhein, MD    Dg C-arm 1-60 Min  07/07/2012  *RADIOLOGY REPORT*  Clinical Data: Lumbar fusion hardware removal  DG C-ARM 1-60 MIN,LUMBAR SPINE - 2-3 VIEW  Comparison: MR 09/08/2006  Findings: Two fluoroscopic spot images document bilateral pedicle screw placement at L1 and L2 with graft devices projecting in the L1-2 interspace.  Graft markers at L2-3 and L3-4 are noted.  IMPRESSION:  1.  P L I F L1-2   Original Report Authenticated By: D. Andria Rhein, MD     Assessment/Plan: Stable  postop.  LOS: 2 days  Mobilizing patient today to bear weight. Feels need to have bowel movement. Could not do so on back pain. We'll remove Foley when out of bed.   Baer Hinton J 07/09/2012, 8:22 AM

## 2012-07-09 NOTE — Progress Notes (Signed)
Pt places self on/off cpap.  Informed pt to call RN if she needs assistance from RT.

## 2012-07-09 NOTE — Evaluation (Signed)
Occupational Therapy Evaluation Patient Details Name: [name] MRN: [id number] DOB: 1950/06/28 Today's Date: 07/09/2012 Time: 4696-2952 OT Time Calculation (min): 28 min  OT Assessment / Plan / Recommendation Clinical Impression  Pt is recovering from PLIF.  She is moving well on POD 2, requiring min to min guard assist.  Will follow to instruct in back precautions and ADL.  Anticipate pt will be able to return home with family and no further OT.    OT Assessment  Patient needs continued OT Services    Follow Up Recommendations  No OT follow up    Barriers to Discharge      Equipment Recommendations  None recommended by OT    Recommendations for Other Services    Frequency  Min 3X/week    Precautions / Restrictions Precautions Precautions: Back;Fall Precaution Booklet Issued: Yes (comment) Precaution Comments: Reviewed back precautions. Required Braces or Orthoses: Spinal Brace Spinal Brace: Lumbar corset;Applied in sitting position Restrictions Weight Bearing Restrictions: No   Pertinent Vitals/Pain 3/10 back, premedicated, repositioned    ADL  Eating/Feeding: Independent Where Assessed - Eating/Feeding: Chair Grooming: Wash/dry hands;Wash/dry face;Set up Where Assessed - Grooming: Unsupported sitting Upper Body Bathing: Set up Where Assessed - Upper Body Bathing: Unsupported sitting Lower Body Bathing: Moderate assistance Where Assessed - Lower Body Bathing: Unsupported sitting;Supported sit to stand Upper Body Dressing: Set up Where Assessed - Upper Body Dressing: Unsupported sitting Lower Body Dressing: Moderate assistance Where Assessed - Lower Body Dressing: Unsupported sitting;Supported sit to stand Toilet Transfer: Min Pension scheme manager Method: Surveyor, minerals: Bedside commode Toileting - Architect and Hygiene: Min guard Where Assessed - Engineer, mining and Hygiene: Standing Equipment Used: Back  brace;Gait belt;Rolling walker Transfers/Ambulation Related to ADLs: min guard assist with RW, verbal cues for hand placement    OT Diagnosis: Generalized weakness;Acute pain  OT Problem List: Decreased strength;Impaired balance (sitting and/or standing);Decreased knowledge of use of DME or AE;Decreased knowledge of precautions;Obesity;Pain OT Treatment Interventions: Self-care/ADL training;Therapeutic activities;DME and/or AE instruction;Patient/family education   OT Goals Acute Rehab OT Goals OT Goal Formulation: With patient Time For Goal Achievement: 07/16/12 Potential to Achieve Goals: Good ADL Goals Pt Will Perform Grooming: with modified independence;Standing at sink (at least two activities) ADL Goal: Grooming - Progress: Goal set today Pt Will Perform Lower Body Bathing: with modified independence;Sitting, edge of bed;Sit to stand from bed;with adaptive equipment ADL Goal: Lower Body Bathing - Progress: Goal set today Pt Will Perform Lower Body Dressing: with modified independence;Sitting, bed;Sit to stand from bed;with adaptive equipment ADL Goal: Lower Body Dressing - Progress: Goal set today Pt Will Transfer to Toilet: with modified independence;Ambulation;3-in-1;Maintaining back safety precautions (over toilet) ADL Goal: Toilet Transfer - Progress: Goal set today Pt Will Perform Toileting - Clothing Manipulation: with modified independence;Standing ADL Goal: Toileting - Clothing Manipulation - Progress: Goal set today Pt Will Perform Toileting - Hygiene: with modified independence;Standing at 3-in-1/toilet ADL Goal: Toileting - Hygiene - Progress: Goal set today Pt Will Perform Tub/Shower Transfer: Shower transfer;Ambulation;Shower seat with back;Maintaining back safety precautions;with supervision ADL Goal: Tub/Shower Transfer - Progress: Goal set today Miscellaneous OT Goals Miscellaneous OT Goal #1: Pt will generalize back precautions in ADL 100% of the time. OT Goal:  Miscellaneous Goal #1 - Progress: Goal set today  Visit Information  Last OT Received On: 07/09/12 Assistance Needed: +1 PT/OT Co-Evaluation/Treatment: Yes    Subjective Data  Subjective: "I need to use the bathroom." Patient Stated Goal: Return with family to her  home.   Prior Functioning     Home Living Lives With: Spouse Available Help at Discharge: Family;Available 24 hours/day Type of Home: House Home Access: Stairs to enter [employer] of Steps: 2 Entrance Stairs-Rails: None Home Layout: One level Bathroom Shower/Tub: Tub/shower unit;Walk-in shower Bathroom Toilet: Standard Bathroom Accessibility: Yes How Accessible: Accessible via walker Home Adaptive Equipment: Walker - rolling;Shower chair with back;Bedside commode/3-in-1;Reacher Prior Function Level of Independence: Independent Able to Take Stairs?: Yes Driving: Yes Vocation: Retired Musician: No difficulties Dominant Hand: Right         Vision/Perception Vision - History Baseline Vision: No visual deficits Patient Visual Report: No change from baseline   Cognition  Cognition Overall Cognitive Status: Appears within functional limits for tasks assessed/performed Arousal/Alertness: Awake/alert Orientation Level: Oriented X4 / Intact Behavior During Session: WFL for tasks performed    Extremity/Trunk Assessment Right Upper Extremity Assessment RUE ROM/Strength/Tone: WFL for tasks assessed RUE Sensation: WFL - Light Touch RUE Coordination: WFL - gross/fine motor Left Upper Extremity Assessment LUE ROM/Strength/Tone: WFL for tasks assessed LUE Sensation: WFL - Light Touch LUE Coordination: WFL - gross/fine motor  Trunk Assessment Trunk Assessment: Normal     Mobility Bed Mobility Bed Mobility: Rolling Right;Right Sidelying to Sit Rolling Right: 4: Min assist;With rail Right Sidelying to Sit: 4: Min assist;HOB flat;With rails Details for Bed Mobility Assistance:  v/c's for logroll technique Transfers Transfers: Sit to Stand;Stand to Sit Sit to Stand: 4: Min guard;With upper extremity assist;From bed;From chair/3-in-1 Stand to Sit: 4: Min guard;With upper extremity assist;To chair/3-in-1 Details for Transfer Assistance: verbal cues for hand placement     Exercise     Balance     End of Session OT - End of Session Activity Tolerance: Patient tolerated treatment well Patient left: in chair;with call bell/phone within reach;with family/visitor present  GO     Evern Bio 07/09/2012, 10:24 AM [phone]

## 2012-07-10 LAB — GLUCOSE, CAPILLARY
Glucose-Capillary: 138 mg/dL — ABNORMAL HIGH (ref 70–99)
Glucose-Capillary: 83 mg/dL (ref 70–99)
Glucose-Capillary: 156 mg/dL — ABNORMAL HIGH (ref 70–99)
Glucose-Capillary: 51 mg/dL — ABNORMAL LOW (ref 70–99)
Glucose-Capillary: 53 mg/dL — ABNORMAL LOW (ref 70–99)

## 2012-07-10 MED FILL — Sodium Chloride IV Soln 0.9%: INTRAVENOUS | Qty: 1000 | Status: AC

## 2012-07-10 MED FILL — Heparin Sodium (Porcine) Inj 1000 Unit/ML: INTRAMUSCULAR | Qty: 30 | Status: AC

## 2012-07-10 NOTE — Progress Notes (Signed)
Subjective: Patient reports No headache. Temp to 101.4. CXR shows atelectasis. Cultures pending. Legs feel better.  Objective: Vital signs in last 24 hours: Temp:  [98.4 F (36.9 C)-101.4 F (38.6 C)] 98.9 F (37.2 C) (03/06 0600) Pulse Rate:  [98-118] 98 (03/06 0600) Resp:  [18-20] 20 (03/06 0600) BP: (115-159)/(50-74) 143/52 mmHg (03/06 0600) SpO2:  [95 %-100 %] 100 % (03/06 0600)  Intake/Output from previous day:   Intake/Output this shift:    Dressing dry and intact. Motor function good in IP Quads. Ambulating. Lungs clear, except at bases. Few crackles.  Lab Results: No results found for this basename: WBC, HGB, HCT, PLT,  in the last 72 hours BMET  Recent Labs  07/08/12 0645  NA 135  K 4.8  CL 100  CO2 24  GLUCOSE 187*  BUN 25*  CREATININE 1.22*  CALCIUM 7.9*    Studies/Results: Dg Chest 2 View  07/09/2012  *RADIOLOGY REPORT*  Clinical Data: Fever  CHEST - 2 VIEW  Comparison: 07/02/2012  Findings: Cardiomegaly.   Elevation of the right diaphragmatic leaflet.  No effusion.  Linear subsegmental atelectasis in both lung bases. Regional bones unremarkable.  IMPRESSION:  1.  New elevation of the right diaphragmatic leaflet.  Patchy bibasilar atelectasis.   Original Report Authenticated By: D. Andria Rhein, MD     Assessment/Plan: Post op fever secondary to atelectasis.   LOS: 3 days  Encouraged pulmonary toilet with deep breathing and incentive spirometer. Mobilizing nicely. Change dressing tomorrow.   ELSNER,HENRY J 07/10/2012, 8:54 AM

## 2012-07-10 NOTE — Progress Notes (Signed)
Hypoglycemic Event  CBG: 53  Treatment: 15 GM carbohydrate snack  Symptoms: None  Follow-up CBG: Time: 2346 CBG Result: 78  Possible Reasons for Event: Unknown  Comments/MD notified:    Alvira Philips  Remember to initiate Hypoglycemia Order Set & complete

## 2012-07-10 NOTE — Progress Notes (Signed)
Occupational Therapy Treatment Patient Details Name: [name] MRN: [id number] DOB: 01-03-1951 Today's Date: 07/10/2012 Time: 1230-1310 OT Time Calculation (min): 40 min  OT Assessment / Plan / Recommendation Comments on Treatment Session Pt is progressing steadily in mobility and is knowledgeable in use of AE and DME and in back precautions.  Will have supervision of family at d/c. Encouraged incentive spirometer--pt with atelectasis.    Follow Up Recommendations  No OT follow up    Barriers to Discharge       Equipment Recommendations  None recommended by OT    Recommendations for Other Services    Frequency     Plan Discharge plan remains appropriate    Precautions / Restrictions Precautions Precautions: Back;Fall Precaution Comments: Pt generalized back precautions in ADL and mobility. Required Braces or Orthoses: Spinal Brace Spinal Brace: Lumbar corset;Applied in sitting position   Pertinent Vitals/Pain Soreness at back incision    ADL  Grooming: Wash/dry hands;Supervision/safety Where Assessed - Grooming: Unsupported standing Lower Body Bathing:  (educated in use of long handled sponge and reacher) Where Assessed - Lower Body Bathing: Unsupported sitting;Supported sit to stand Upper Body Dressing: Set up (back brace, front opening gown) Where Assessed - Upper Body Dressing: Unsupported sitting Lower Body Dressing: Supervision/safety (with AE) Where Assessed - Lower Body Dressing: Unsupported sitting;Supported sit to stand Toilet Transfer: Supervision/safety Toilet Transfer Method: Sit to Barista: Comfort height toilet Toileting - Clothing Manipulation and Hygiene: Modified independent Where Assessed - Engineer, mining and Hygiene: Standing Equipment Used: Back brace;Gait belt;Rolling walker Transfers/Ambulation Related to ADLs: supervision with RW. ADL Comments: Pt is knowledgeable in use of AE and in back precautions.     OT Diagnosis:    OT Problem List:   OT Treatment Interventions:     OT Goals ADL Goals Pt Will Perform Grooming: with modified independence;Standing at sink ADL Goal: Grooming - Progress: Progressing toward goals Pt Will Perform Lower Body Bathing: with modified independence;Sitting, edge of bed;Sit to stand from bed;with adaptive equipment ADL Goal: Lower Body Bathing - Progress: Progressing toward goals Pt Will Perform Lower Body Dressing: with modified independence;Sitting, bed;Sit to stand from bed;with adaptive equipment ADL Goal: Lower Body Dressing - Progress: Progressing toward goals Pt Will Transfer to Toilet: with modified independence;Ambulation;3-in-1;Maintaining back safety precautions ADL Goal: Toilet Transfer - Progress: Progressing toward goals Pt Will Perform Toileting - Clothing Manipulation: with modified independence;Standing ADL Goal: Toileting - Clothing Manipulation - Progress: Progressing toward goals Pt Will Perform Toileting - Hygiene: with modified independence;Standing at 3-in-1/toilet ADL Goal: Toileting - Hygiene - Progress: Met Pt Will Perform Tub/Shower Transfer: Shower transfer;Ambulation;Shower seat with back;Maintaining back safety precautions;with supervision Miscellaneous OT Goals Miscellaneous OT Goal #1: Pt will generalize back precautions in ADL 100% of the time. OT Goal: Miscellaneous Goal #1 - Progress: Met  Visit Information  Last OT Received On: 07/10/12 Assistance Needed: +1    Subjective Data      Prior Functioning       Cognition  Cognition Overall Cognitive Status: Appears within functional limits for tasks assessed/performed Arousal/Alertness: Awake/alert Orientation Level: Oriented X4 / Intact Behavior During Session: [facility] for tasks performed    Mobility  Bed Mobility Bed Mobility: Not assessed Transfers Transfers: Sit to Stand;Stand to Sit Sit to Stand: With upper extremity assist;5: Supervision;From chair/3-in-1;From  toilet Stand to Sit: 6: Modified independent (Device/Increase time);With upper extremity assist;To chair/3-in-1;To toilet Details for Transfer Assistance: moves slowly    Exercises      Balance  End of Session OT - End of Session Activity Tolerance: Patient tolerated treatment well Patient left: in chair;with call bell/phone within reach;with family/visitor present  GO     Evern Bio 07/10/2012, 1:16 PM [phone]

## 2012-07-10 NOTE — Progress Notes (Signed)
PT just called me back and said she was confused. She does wear a CPAP and he husband puts he on and off it. I told her to call if he has any troubles

## 2012-07-10 NOTE — Progress Notes (Signed)
Physical Therapy Treatment Patient Details Name: [name] MRN: [id number] DOB: 06/03/1950 Today's Date: 07/10/2012 Time: 3086-5784 PT Time Calculation (min): 23 min  PT Assessment / Plan / Recommendation Comments on Treatment Session  Pt. progressing with her mobility.  Atelectasiss on chest XR    Follow Up Recommendations  No PT follow up;Supervision - Intermittent     Does the patient have the potential to tolerate intense rehabilitation     Barriers to Discharge        Equipment Recommendations  None recommended by PT    Recommendations for Other Services    Frequency Min 5X/week   Plan Discharge plan remains appropriate    Precautions / Restrictions Precautions Precautions: Back;Fall Precaution Comments: Pt generalized back precautions in ADL and mobility. Required Braces or Orthoses: Spinal Brace Spinal Brace: Lumbar corset;Applied in sitting position Restrictions Weight Bearing Restrictions: No   Pertinent Vitals/Pain See vitals tab    Mobility  Bed Mobility Bed Mobility: Rolling Right;Right Sidelying to Sit Rolling Right: With rail;4: Min guard Right Sidelying to Sit: 4: Min assist;HOB flat;With rails Details for Bed Mobility Assistance: pt. needed reminder for log rolling technque Transfers Transfers: Sit to Stand;Stand to Sit Sit to Stand: 5: Supervision;With upper extremity assist;From bed Stand to Sit: 6: Modified independent (Device/Increase time);With upper extremity assist;To bed Details for Transfer Assistance: slow transitions Ambulation/Gait Ambulation/Gait Assistance: 4: Min guard Ambulation Distance (Feet): 120 Feet Assistive device: Rolling walker Ambulation/Gait Assistance Details: managing well with no overt LOB Gait Pattern: Within Functional Limits;Step-through pattern Gait velocity: decr Stairs: No    Exercises     PT Diagnosis:    PT Problem List:   PT Treatment Interventions:     PT Goals Acute Rehab PT Goals Pt will  Roll Supine to Right Side: with min assist;with modified independence PT Goal: Rolling Supine to Right Side - Progress: Progressing toward goal Pt will go Supine/Side to Sit: with modified independence PT Goal: Supine/Side to Sit - Progress: Progressing toward goal Pt will Ambulate: >150 feet;with modified independence;with rolling walker PT Goal: Ambulate - Progress: Progressing toward goal Additional Goals Additional Goal #1: Pt independent with recall of back precautions and 100% compliant PT Goal: Additional Goal #1 - Progress: Progressing toward goal  Visit Information  Last PT Received On: 07/10/12 Assistance Needed: +1    Subjective Data  Subjective: Pt. sleeping soundly upon PT entry into her room, however she is willing to participate. Patient Stated Goal: home   Cognition  Cognition Overall Cognitive Status: Appears within functional limits for tasks assessed/performed Arousal/Alertness: Awake/alert Orientation Level: Oriented X4 / Intact Behavior During Session: [facility] for tasks performed    Balance     End of Session PT - End of Session Equipment Utilized During Treatment: Gait belt;Back brace Activity Tolerance: Patient tolerated treatment well Patient left: in bed;with call bell/phone within reach;with family/visitor present Nurse Communication: Mobility status   GP     Ferman Hamming 07/10/2012, 3:24 PM Weldon Picking PT Acute Rehab Services [phone] Beeper [phone]

## 2012-07-10 NOTE — Progress Notes (Signed)
PT stated that she does not wear a CPAP. She wore one on the 4TH according to doc flow sheets. She said she does not want to wear one tonight

## 2012-07-11 LAB — GLUCOSE, CAPILLARY
Glucose-Capillary: 59 mg/dL — ABNORMAL LOW (ref 70–99)
Glucose-Capillary: 101 mg/dL — ABNORMAL HIGH (ref 70–99)
Glucose-Capillary: 164 mg/dL — ABNORMAL HIGH (ref 70–99)

## 2012-07-11 NOTE — Progress Notes (Signed)
Hypoglycemic Event  CBG: 59  Treatment: 15 GM carbohydrate snack  Symptoms: None  Follow-up CBG: Time:0700 CBG Result:101  Possible Reasons for Event: Unknown  Comments/MD notified:    Alvira Philips  Remember to initiate Hypoglycemia Order Set & complete

## 2012-07-11 NOTE — Progress Notes (Signed)
Pt's incision draining moderate to large amount of serosanguineous fluid.  Pt's dressing was soaked with fluid and dripping.  RN changed pt's dressing. MD paged.

## 2012-07-11 NOTE — Progress Notes (Signed)
Patient ID: [name], female   DOB: 07-Sep-1950, 62 y.o.   MRN: [id number] Vital signs have been stable. Dressing this morning apparently became saturated. He was changed about 9:00. At 5 PM it was moist. Incision appears clean however moisture as released all the Steri-Strips. Pain in the incision with Betadine and redressed it with gauze and tape. We should continue to do this on a twice a day basis. I'll outpatient to shower. Noted the patient until incision is completely dry she will require staying here in the hospital.
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
## 2012-07-12 LAB — GLUCOSE, CAPILLARY
Glucose-Capillary: 131 mg/dL — ABNORMAL HIGH (ref 70–99)
Glucose-Capillary: 207 mg/dL — ABNORMAL HIGH (ref 70–99)

## 2012-07-12 NOTE — Progress Notes (Signed)
Filed Vitals:   07/11/12 1848 07/11/12 2102 07/12/12 0238 07/12/12 0632  BP: 130/54 168/57 141/58 140/57  Pulse: 91 100 76 67  Temp: 100 F (37.8 C) 98.8 F (37.1 C) 97.9 F (36.6 C) 97.5 F (36.4 C)  TempSrc: Oral Oral Oral Oral  Resp: 20 18 20 20   Height:      Weight:      SpO2: 100% 100% 100% 100%     Patient resting comfortably in bed. Has been up and ambulating in halls. Bandage has moderate amount of serosanguineous drainage. Has been being changed by nursing staff twice a day.  Plan: Continue current care. Encouraged to ambulate.  Hewitt Shorts, MD 07/12/2012, 8:43 AM

## 2012-07-12 NOTE — Progress Notes (Signed)
0315 am patient called was diaphoretic and said that she felt as if her blood sugar was low.  Checked  CBG = 47; Adm. 240cc regular cola, 1 cup mixed fruit, 8oz cheese +4 crackers.  Patient stated she did not eat her snack at HS. Re-checked CBG = 100.  Will continue to monitor & resume AC/ check  B'fast.

## 2012-07-13 LAB — GLUCOSE, CAPILLARY
Glucose-Capillary: 175 mg/dL — ABNORMAL HIGH (ref 70–99)
Glucose-Capillary: 140 mg/dL — ABNORMAL HIGH (ref 70–99)

## 2012-07-13 NOTE — Progress Notes (Signed)
Subjective: Patient reports back still draining  Objective: Vital signs in last 24 hours: Temp:  [97.5 F (36.4 C)-99.5 F (37.5 C)] 98.6 F (37 C) (03/09 0200) Pulse Rate:  [67-86] 83 (03/09 0200) Resp:  [18-20] 18 (03/09 0200) BP: (128-150)/(50-58) 139/54 mmHg (03/09 0200) SpO2:  [98 %-100 %] 98 % (03/09 0200)  Intake/Output from previous day: 03/08 0701 - 03/09 0700 In: 360 [P.O.:360] Out: -  Intake/Output this shift:    Physical Exam: No complaint of headache or leg pain.  Current dressing dry and recently changed.  Old dressing with serosanguinous drainage.  Lab Results: No results found for this basename: WBC, HGB, HCT, PLT,  in the last 72 hours BMET No results found for this basename: NA, K, CL, CO2, GLUCOSE, BUN, CREATININE, CALCIUM,  in the last 72 hours  Studies/Results: No results found.  Assessment/Plan: Dr. Wynetta Emery to reassess in am and make decision as to need for re-exploration.     LOS: 6 days    Dorian Heckle, MD 07/13/2012, 5:26 AM

## 2012-07-13 NOTE — Progress Notes (Signed)
PT puts herself on CPAP. I instructed her to call if she needed any help
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
## 2012-07-14 LAB — GLUCOSE, CAPILLARY
Glucose-Capillary: 135 mg/dL — ABNORMAL HIGH (ref 70–99)
Glucose-Capillary: 135 mg/dL — ABNORMAL HIGH (ref 70–99)

## 2012-07-14 MED ORDER — DOXYCYCLINE HYCLATE 50 MG PO CAPS: 100.0000 mg | ORAL_CAPSULE | Freq: Two times a day (BID) | ORAL | Status: DC

## 2012-07-14 MED ORDER — CYCLOBENZAPRINE HCL 10 MG PO TABS: 10.0000 mg | ORAL_TABLET | Freq: Three times a day (TID) | ORAL | Status: DC | PRN

## 2012-07-14 MED ORDER — HYDROMORPHONE HCL 2 MG PO TABS: 2.0000 mg | ORAL_TABLET | ORAL | Status: DC | PRN

## 2012-07-14 NOTE — Discharge Summary (Signed)
Physician Discharge Summary  Patient ID: [name] MRN: [id number] DOB/AGE: 1950/06/24 62 y.o.  Admit date: 07/07/2012 Discharge date: 07/14/2012  Admission Diagnoses: Lumbar spondylosis with stenosis and herniated nucleus pulposis L1-2  Discharge Diagnoses: Same Active Problems:   * No active hospital problems. *   Discharged Condition: good  Hospital Course: This is been the hospital underwent a posterior lumbar interbody fusion L1 to with extension of fusion removal of hardware and redo posterior lateral fusion at L1-2. Postoperatively patient did very well recovered in the floor on the floor patient was convalescing well initially had begin flat the first couple days secondary to intraoperative spinal fluid leak. As the patient mobilize she started draining out of her incision however this progressively slow down over the next couple days. And on arrival on postoperative day 7 she was ambulating and voiding spontaneously tolerating regular diet pain was well controlled on pills she had very minimal drainage from her incision throughout the last 24 hours and patient expressed extensive interest and wanted to go home. I explained the risks and benefits of going home with some potential drainage with her spinal fluid or infection. She understood this however the fact that she has had very minimal drainage and has been up and ambulating I do not think this spinal fluid. The incision showed no other sign of infection other than a small amount of more serous-looking drainage. We agreed to g home with short followup on antibiotics as well as pain medication. I explained to her I want him to do nothing physical and wire around the house for the most part while drainage slowed down to calm a for any signs of increased redness erythema drainage or fevers. The patient and her husband expired express that they understood this and we'll plan come back to see me on Thursday.  Consults: Significant  Diagnostic Studies: Treatments: Posterior lumbar fusion L1-2 Discharge Exam: Blood pressure 152/56, pulse 86, temperature 99.1 F (37.3 C), temperature source Oral, resp. rate 18, height 5' 3.5" (1.613 m), weight 99.9 kg (220 lb 3.8 oz), SpO2 100.00%. Strength out of 5 wound clean dry  Disposition: Home     Medication List    TAKE these medications       albuterol 108 (90 BASE) MCG/ACT inhaler  Commonly known as:  PROVENTIL HFA;VENTOLIN HFA  Inhale 1 puff into the lungs every 4 (four) hours as needed for shortness of breath.     aspirin 81 MG tablet  Take 81 mg by mouth daily.     Biotin 5000 MCG Caps  Take 5,000 mcg by mouth daily.     clobetasol 0.05 % topical foam  Commonly known as:  OLUX  Apply 1 application topically daily as needed (apply to head for dry scalp).     cyclobenzaprine 10 MG tablet  Commonly known as:  FLEXERIL  Take 1 tablet (10 mg total) by mouth 3 (three) times daily as needed for muscle spasms.     CYMBALTA 60 MG capsule  Generic drug:  DULoxetine  Take 60 mg by mouth daily.     doxycycline 50 MG capsule  Commonly known as:  VIBRAMYCIN  Take 2 capsules (100 mg total) by mouth 2 (two) times daily.     glimepiride 4 MG tablet  Commonly known as:  AMARYL  Take 8 mg by mouth daily after supper.     GREEN COFFEE BEAN PO  Take 1 tablet by mouth daily.     HYDROmorphone 2 MG tablet  Commonly known as:  DILAUDID  Take 1 tablet (2 mg total) by mouth every 4 (four) hours as needed.     HYDROmorphone 2 MG tablet  Commonly known as:  DILAUDID  Take 2 mg by mouth every 4 (four) hours as needed for pain.     lisinopril-hydrochlorothiazide 20-25 MG per tablet  Commonly known as:  PRINZIDE,ZESTORETIC  Take 1 tablet by mouth daily.     LORazepam 1 MG tablet  Commonly known as:  ATIVAN  Take 1 mg by mouth 3 (three) times daily.     meperidine 50 MG tablet  Commonly known as:  DEMEROL  Take 50 mg by mouth 2 (two) times daily as needed for pain.      metFORMIN 1000 MG tablet  Commonly known as:  GLUCOPHAGE  Take 1,000 mg by mouth 2 (two) times daily with a meal.     MULTI-VITAMIN DAILY PO  Take 1 tablet by mouth daily.     pantoprazole 40 MG tablet  Commonly known as:  PROTONIX  Take 40 mg by mouth daily as needed (for reflux). For acid reflux     simvastatin 40 MG tablet  Commonly known as:  ZOCOR  Take 40 mg by mouth at bedtime.           Follow-up Information   Follow up In 3 days.      Signed: Mahlik Lenn P 07/14/2012, 7:38 AM

## 2012-07-14 NOTE — Progress Notes (Signed)
Pt states she can place herself on CPAP when ready later tonight. Will notify if needed by RT.

## 2012-07-14 NOTE — Progress Notes (Signed)
Subjective: Patient reports Overall she's doing okay she said her pain level is about 3/10 she is very minimally taking her pain medication she has a lot of headache but is not affected by weather she is lying down or sitting up and she really wants to go home  Objective: Vital signs in last 24 hours: Temp:  [98.3 F (36.8 C)-99.7 F (37.6 C)] 99.1 F (37.3 C) (03/10 0600) Pulse Rate:  [72-86] 86 (03/10 0600) Resp:  [18] 18 (03/10 0600) BP: (114-155)/(39-59) 152/56 mmHg (03/10 0600) SpO2:  [98 %-100 %] 100 % (03/10 0600)  Intake/Output from previous day: 03/09 0701 - 03/10 0700 In: 1080 [P.O.:1080] Out: -  Intake/Output this shift:    Awake alert strength out of 5 wound shows very minimal drainage of serous-type fluid  Lab Results: No results found for this basename: WBC, HGB, HCT, PLT,  in the last 72 hours BMET No results found for this basename: NA, K, CL, CO2, GLUCOSE, BUN, CREATININE, CALCIUM,  in the last 72 hours  Studies/Results: No results found.  Assessment/Plan: Posterior day 7 from plus overall she's doing very well she'll of the drainage from her wound over the weekend however slowed down significantly very little drainage throughout the day yesterday and overnight she has just a low-grade temperature 99.2 but she's afebrile otherwise. She is expressed it is really wanted to go home and I do not think that she is leaking spinal fluid her about her wound looked good so no sign of infection think it's okay for her to go home with assistance primarily discharging her on antibiotics to keep her from this getting infected well as well as down the drainage in that scheduled early followup approximately on Thursday  LOS: 7 days     CRAM,GARY P 07/14/2012, 7:34 AM

## 2012-07-15 ENCOUNTER — Encounter (HOSPITAL_COMMUNITY): Payer: Self-pay | Admitting: Anesthesiology

## 2012-07-15 ENCOUNTER — Encounter (HOSPITAL_COMMUNITY)
Admission: RE | Disposition: A | Discharge status: Home / Self Care | Payer: Self-pay | Source: Ambulatory Visit | Attending: Neurosurgery

## 2012-07-15 ENCOUNTER — Inpatient Hospital Stay (HOSPITAL_COMMUNITY): Payer: BC Managed Care – PPO | Admitting: Anesthesiology

## 2012-07-15 HISTORY — PX: LUMBAR WOUND DEBRIDEMENT: SHX1988

## 2012-07-15 LAB — CULTURE, BLOOD (ROUTINE X 2)
Culture: NO GROWTH
Culture: NO GROWTH

## 2012-07-15 LAB — GLUCOSE, CAPILLARY
Glucose-Capillary: 114 mg/dL — ABNORMAL HIGH (ref 70–99)
Glucose-Capillary: 144 mg/dL — ABNORMAL HIGH (ref 70–99)

## 2012-07-15 SURGERY — LUMBAR WOUND DEBRIDEMENT
Anesthesia: General

## 2012-07-15 MED ORDER — BACITRACIN ZINC 500 UNIT/GM EX OINT
TOPICAL_OINTMENT | CUTANEOUS | Status: DC | PRN
  Administered 2012-07-15: 1 via TOPICAL

## 2012-07-15 MED ORDER — VANCOMYCIN HCL 1000 MG IV SOLR
1000.0000 mg | INTRAVENOUS | Status: DC | PRN
  Administered 2012-07-15: 1000 mg via INTRAVENOUS

## 2012-07-15 MED ORDER — ONDANSETRON HCL 4 MG/2ML IJ SOLN
4.0000 mg | INTRAMUSCULAR | Status: DC | PRN
  Administered 2012-07-18 – 2012-07-21 (×2): 4 mg via INTRAVENOUS
  Filled 2012-07-15: qty 2

## 2012-07-15 MED ORDER — SODIUM CHLORIDE 0.9 % IV SOLN
INTRAVENOUS | Status: AC
  Filled 2012-07-15: qty 500

## 2012-07-15 MED ORDER — THROMBIN 5000 UNITS EX SOLR
CUTANEOUS | Status: DC | PRN
  Administered 2012-07-15 (×2): 5000 [IU] via TOPICAL

## 2012-07-15 MED ORDER — PROPOFOL 10 MG/ML IV BOLUS
INTRAVENOUS | Status: DC | PRN
  Administered 2012-07-15: 230 mg via INTRAVENOUS

## 2012-07-15 MED ORDER — PHENOL 1.4 % MT LIQD: 1.0000 | OROMUCOSAL | Status: DC | PRN

## 2012-07-15 MED ORDER — HEMOSTATIC AGENTS (NO CHARGE) OPTIME
TOPICAL | Status: DC | PRN
  Administered 2012-07-15: 1 via TOPICAL

## 2012-07-15 MED ORDER — VANCOMYCIN HCL 1000 MG IV SOLR
750.0000 mg | Freq: Two times a day (BID) | INTRAVENOUS | Status: DC
  Administered 2012-07-15 – 2012-07-20 (×10): 750 mg via INTRAVENOUS
  Filled 2012-07-15 (×11): qty 750

## 2012-07-15 MED ORDER — DOCUSATE SODIUM 100 MG PO CAPS
100.0000 mg | ORAL_CAPSULE | Freq: Two times a day (BID) | ORAL | Status: DC
  Administered 2012-07-15 – 2012-07-22 (×12): 100 mg via ORAL
  Filled 2012-07-15 (×16): qty 1

## 2012-07-15 MED ORDER — GLYCOPYRROLATE 0.2 MG/ML IJ SOLN
INTRAMUSCULAR | Status: DC | PRN
  Administered 2012-07-15: 0.4 mg via INTRAVENOUS

## 2012-07-15 MED ORDER — LIDOCAINE HCL (CARDIAC) 20 MG/ML IV SOLN
INTRAVENOUS | Status: DC | PRN
  Administered 2012-07-15: 100 mg via INTRAVENOUS

## 2012-07-15 MED ORDER — ROCURONIUM BROMIDE 100 MG/10ML IV SOLN
INTRAVENOUS | Status: DC | PRN
  Administered 2012-07-15: 10 mg via INTRAVENOUS
  Administered 2012-07-15: 25 mg via INTRAVENOUS

## 2012-07-15 MED ORDER — LIDOCAINE HCL 4 % MT SOLN
OROMUCOSAL | Status: DC | PRN
  Administered 2012-07-15: 4 mL via TOPICAL

## 2012-07-15 MED ORDER — SODIUM CHLORIDE 0.9 % IJ SOLN: 3.0000 mL | INTRAMUSCULAR | Status: DC | PRN

## 2012-07-15 MED ORDER — FENTANYL CITRATE 0.05 MG/ML IJ SOLN
INTRAMUSCULAR | Status: DC | PRN
  Administered 2012-07-15 (×2): 50 ug via INTRAVENOUS
  Administered 2012-07-15: 25 ug via INTRAVENOUS
  Administered 2012-07-15 (×2): 50 ug via INTRAVENOUS
  Administered 2012-07-15: 25 ug via INTRAVENOUS

## 2012-07-15 MED ORDER — CYCLOBENZAPRINE HCL 10 MG PO TABS
10.0000 mg | ORAL_TABLET | Freq: Three times a day (TID) | ORAL | Status: DC | PRN
  Filled 2012-07-15: qty 1

## 2012-07-15 MED ORDER — BACITRACIN 50000 UNITS IM SOLR
INTRAMUSCULAR | Status: AC
  Filled 2012-07-15: qty 1

## 2012-07-15 MED ORDER — ACETAMINOPHEN 650 MG RE SUPP: 650.0000 mg | RECTAL | Status: DC | PRN

## 2012-07-15 MED ORDER — ACETAMINOPHEN 325 MG PO TABS
650.0000 mg | ORAL_TABLET | ORAL | Status: DC | PRN
  Administered 2012-07-16 – 2012-07-20 (×4): 650 mg via ORAL
  Filled 2012-07-15 (×4): qty 2

## 2012-07-15 MED ORDER — ONDANSETRON HCL 4 MG/2ML IJ SOLN
INTRAMUSCULAR | Status: DC | PRN
  Administered 2012-07-15: 4 mg via INTRAVENOUS

## 2012-07-15 MED ORDER — 0.9 % SODIUM CHLORIDE (POUR BTL) OPTIME
TOPICAL | Status: DC | PRN
  Administered 2012-07-15: 1000 mL

## 2012-07-15 MED ORDER — SODIUM CHLORIDE 0.9 % IV SOLN: 250.0000 mL | INTRAVENOUS | Status: DC

## 2012-07-15 MED ORDER — OXYCODONE-ACETAMINOPHEN 5-325 MG PO TABS: 1.0000 | ORAL_TABLET | ORAL | Status: DC | PRN

## 2012-07-15 MED ORDER — HYDROMORPHONE HCL PF 1 MG/ML IJ SOLN: 0.2500 mg | INTRAMUSCULAR | Status: DC | PRN

## 2012-07-15 MED ORDER — NEOSTIGMINE METHYLSULFATE 1 MG/ML IJ SOLN
INTRAMUSCULAR | Status: DC | PRN
  Administered 2012-07-15: 3 mg via INTRAVENOUS

## 2012-07-15 MED ORDER — HYDROMORPHONE HCL PF 1 MG/ML IJ SOLN
0.5000 mg | INTRAMUSCULAR | Status: DC | PRN
Start: 2012-07-15 — End: 2012-07-22
  Filled 2012-07-15: qty 1

## 2012-07-15 MED ORDER — ONDANSETRON HCL 4 MG/2ML IJ SOLN: 4.0000 mg | Freq: Once | INTRAMUSCULAR | Status: DC | PRN

## 2012-07-15 MED ORDER — MENTHOL 3 MG MT LOZG: 1.0000 | LOZENGE | OROMUCOSAL | Status: DC | PRN

## 2012-07-15 MED ORDER — SODIUM CHLORIDE 0.9 % IJ SOLN
3.0000 mL | Freq: Two times a day (BID) | INTRAMUSCULAR | Status: DC
  Administered 2012-07-15 – 2012-07-18 (×7): 3 mL via INTRAVENOUS

## 2012-07-15 MED ORDER — LACTATED RINGERS IV SOLN
INTRAVENOUS | Status: DC | PRN
  Administered 2012-07-15 (×2): via INTRAVENOUS

## 2012-07-15 SURGICAL SUPPLY — 73 items
ADH SKN CLS APL DERMABOND .7 (GAUZE/BANDAGES/DRESSINGS) ×1
APL SKNCLS STERI-STRIP NONHPOA (GAUZE/BANDAGES/DRESSINGS)
BAG DECANTER FOR FLEXI CONT (MISCELLANEOUS) ×2 IMPLANT
BENZOIN TINCTURE PRP APPL 2/3 (GAUZE/BANDAGES/DRESSINGS) ×1 IMPLANT
BLADE SURG ROTATE 9660 (MISCELLANEOUS) IMPLANT
BRUSH SCRUB EZ PLAIN DRY (MISCELLANEOUS) ×2 IMPLANT
CANISTER SUCTION 2500CC (MISCELLANEOUS) ×2 IMPLANT
CLOTH BEACON ORANGE TIMEOUT ST (SAFETY) ×2 IMPLANT
CONT SPEC 4OZ CLIKSEAL STRL BL (MISCELLANEOUS) ×2 IMPLANT
CORDS BIPOLAR (ELECTRODE) ×2 IMPLANT
DECANTER SPIKE VIAL GLASS SM (MISCELLANEOUS) ×2 IMPLANT
DERMABOND ADVANCED (GAUZE/BANDAGES/DRESSINGS) ×1
DERMABOND ADVANCED .7 DNX12 (GAUZE/BANDAGES/DRESSINGS) ×1 IMPLANT
DRAIN SUBARACHNOID (WOUND CARE) ×1 IMPLANT
DRAPE EENT ADH APERT 15X15 STR (DRAPES) ×1 IMPLANT
DRAPE LAPAROTOMY 100X72X124 (DRAPES) ×2 IMPLANT
DRAPE POUCH INSTRU U-SHP 10X18 (DRAPES) ×2 IMPLANT
DRAPE SURG 17X23 STRL (DRAPES) IMPLANT
DRSG OPSITE 4X5.5 SM (GAUZE/BANDAGES/DRESSINGS) ×4 IMPLANT
DURAFORM COLLAGEN 1X1 5-PACK (Neuro Prosthesis/Implant) ×1 IMPLANT
DURASEAL SPINE SEALANT 3ML (MISCELLANEOUS) ×1 IMPLANT
ELECT REM PT RETURN 9FT ADLT (ELECTROSURGICAL) ×2
ELECTRODE REM PT RTRN 9FT ADLT (ELECTROSURGICAL) ×1 IMPLANT
EVACUATOR 1/8 PVC DRAIN (DRAIN) IMPLANT
GAUZE SPONGE 4X4 16PLY XRAY LF (GAUZE/BANDAGES/DRESSINGS) ×1 IMPLANT
GLOVE BIO SURGEON STRL SZ 6.5 (GLOVE) IMPLANT
GLOVE BIO SURGEON STRL SZ7 (GLOVE) IMPLANT
GLOVE BIO SURGEON STRL SZ7.5 (GLOVE) IMPLANT
GLOVE BIO SURGEON STRL SZ8 (GLOVE) ×2 IMPLANT
GLOVE BIO SURGEON STRL SZ8.5 (GLOVE) IMPLANT
GLOVE BIOGEL M 8.0 STRL (GLOVE) IMPLANT
GLOVE ECLIPSE 7.0 STRL STRAW (GLOVE) IMPLANT
GLOVE ECLIPSE 7.5 STRL STRAW (GLOVE) ×4 IMPLANT
GLOVE ECLIPSE 8.0 STRL XLNG CF (GLOVE) IMPLANT
GLOVE ECLIPSE 8.5 STRL (GLOVE) IMPLANT
GLOVE EXAM NITRILE LRG STRL (GLOVE) IMPLANT
GLOVE EXAM NITRILE MD LF STRL (GLOVE) IMPLANT
GLOVE EXAM NITRILE XL STR (GLOVE) IMPLANT
GLOVE EXAM NITRILE XS STR PU (GLOVE) IMPLANT
GLOVE INDICATOR 6.5 STRL GRN (GLOVE) IMPLANT
GLOVE INDICATOR 7.0 STRL GRN (GLOVE) IMPLANT
GLOVE INDICATOR 7.5 STRL GRN (GLOVE) ×1 IMPLANT
GLOVE INDICATOR 8.0 STRL GRN (GLOVE) ×1 IMPLANT
GLOVE INDICATOR 8.5 STRL (GLOVE) ×2 IMPLANT
GLOVE OPTIFIT SS 8.0 STRL (GLOVE) IMPLANT
GLOVE SURG SS PI 6.5 STRL IVOR (GLOVE) IMPLANT
GOWN BRE IMP SLV AUR LG STRL (GOWN DISPOSABLE) ×3 IMPLANT
GOWN BRE IMP SLV AUR XL STRL (GOWN DISPOSABLE) ×2 IMPLANT
GOWN STRL REIN 2XL LVL4 (GOWN DISPOSABLE) ×3 IMPLANT
KIT BASIN OR (CUSTOM PROCEDURE TRAY) ×2 IMPLANT
KIT DRAIN CSF ACCUDRAIN (MISCELLANEOUS) ×1 IMPLANT
KIT ROOM TURNOVER OR (KITS) ×2 IMPLANT
NDL HYPO 25X1 1.5 SAFETY (NEEDLE) IMPLANT
NEEDLE HYPO 25X1 1.5 SAFETY (NEEDLE) IMPLANT
NS IRRIG 1000ML POUR BTL (IV SOLUTION) ×2 IMPLANT
PACK LAMINECTOMY NEURO (CUSTOM PROCEDURE TRAY) ×2 IMPLANT
PATTIES SURGICAL .75X.75 (GAUZE/BANDAGES/DRESSINGS) IMPLANT
SPONGE GAUZE 4X4 12PLY (GAUZE/BANDAGES/DRESSINGS) ×2 IMPLANT
SPONGE SURGIFOAM ABS GEL SZ50 (HEMOSTASIS) ×2 IMPLANT
STRIP CLOSURE SKIN 1/2X4 (GAUZE/BANDAGES/DRESSINGS) ×2 IMPLANT
SUT ETHILON 3 0 FSL (SUTURE) ×2 IMPLANT
SUT VIC AB 0 CT1 18XCR BRD8 (SUTURE) ×1 IMPLANT
SUT VIC AB 0 CT1 8-18 (SUTURE) ×2
SUT VIC AB 2-0 CT1 18 (SUTURE) ×2 IMPLANT
SUT VICRYL 4-0 PS2 18IN ABS (SUTURE) ×2 IMPLANT
SWAB CULTURE LIQ STUART DBL (MISCELLANEOUS) ×3 IMPLANT
SYR 20ML ECCENTRIC (SYRINGE) ×2 IMPLANT
SYR CONTROL 10ML LL (SYRINGE) IMPLANT
TIP APPLICATOR ×1 IMPLANT
TOWEL OR 17X24 6PK STRL BLUE (TOWEL DISPOSABLE) ×2 IMPLANT
TOWEL OR 17X26 10 PK STRL BLUE (TOWEL DISPOSABLE) ×2 IMPLANT
TUBE ANAEROBIC SPECIMEN COL (MISCELLANEOUS) ×3 IMPLANT
WATER STERILE IRR 1000ML POUR (IV SOLUTION) ×2 IMPLANT

## 2012-07-15 NOTE — Progress Notes (Signed)
Per Tammi Sou, patient being transferred from OR to 3110 for closer monitoring.  Patient's husband notified and the tech and I helped him pack up her room on [address].  He took her belongings to his car and we gave him her new room number and directions to 3100.  Lance Bosch, RN

## 2012-07-15 NOTE — Op Note (Signed)
Preoperative diagnosis: CSF leak from previous lumbar surgery  Postoperative diagnosis: Same  Procedure: Reexploration of lumbar wound for repair of CSF leak using a fascial patch DuraGen followed by placement of a lumbar drain  Surgeon: Jillyn Hidden Jouri Threat  CC: Gen.  Anesthesia: Gen.  EBL: Minimal  Drains: One lumbar drain  Labs: Tissue cultures sent  History of present illness:: Patient is a very pleasant 62 year old female who 1 week ago underwent lumbar fusion at L1-2 intraoperatively there was a dural tear that was patched however as the patient was mobilized lateral part of the week she was leaking spinal fluid out in for aspect of her incision due to the size and location of the previous tear and repair was recommended the best potential surgery to repair this was an open redo repair possible placement of lumbar drain extensively reviewed the risks and benefits of the operation with the patient and her husband and they have assured and agreed to proceed forward.  Operative procedure: Patient was induced under general anesthesia positioned prone the Wilson frame her old incision was opened up and the superior two thirds of the previous sutures were removed and a retractor was placed and visualizing the decompressive laminotomy and previous lumbar leak. The knee was CSF egress noted coming around the previous patch and after further dissection the when the sutures that held the patch in place, loose and CSF was leaking around it. The patch was removed and additional piece of fascia was harvested on multiple sutures were placed at 6:00 4:00 2:00 and 12:00 and around the incision up against the bone the fascia was sutured down against the CSF leak and talked about underneath the bone as well as a piece of DuraGen was overlaid top of the patch overlying the nerve root in the foramen completely covering the area. At this point there was no CSF leak upon Valsalva sewed DuraSeal was then applied at this  point applied lumbar drain was placed and using a puncture hole that was paramedian and inferior needle was passed and passed into the thecal sac and proximal L3-4 below the previous decompression. After a tuey needle achieved adequate CSF flow the catheter was threaded through the tibial the tibial or was removed the catheter was threaded approximately 10 cm to get above the son source of leak then at this point was copious irrigated and meticulous hemostasis was maintained and the wounds closed in layers with after Vicryl and the skin was closed with a running nylon the drain was also sutured in place with a nylon and an abductor collecting system patient recovered in stable condition. At the case all needle counts sponge counts were correct.

## 2012-07-15 NOTE — Preoperative (Signed)
Beta Blockers   Reason not to administer Beta Blockers:Not Applicable 

## 2012-07-15 NOTE — Progress Notes (Signed)
ANTIBIOTIC CONSULT NOTE - FOLLOW UP  Pharmacy Consult for Vancomycin Indication: surgical prophylaxis  Allergies  Allergen Reactions  . Victoza (Liraglutide)     Severe rash and hives.  Marland Kitchen Ultram (Tramadol) Rash  . Betadine (Povidone Iodine) Other (See Comments)    Skin peels  . Codeine     Severe headaches  . Contrast Media (Iodinated Diagnostic Agents) Nausea And Vomiting  . Hydrocodone Other (See Comments)    headache  . Iodine     Iodine put on for surgery  . Januvia (Sitagliptin) Diarrhea  . Morphine And Related Nausea And Vomiting  . Percocet (Oxycodone-Acetaminophen) Other (See Comments)    Headache   . Septra (Sulfamethoxazole W-Trimethoprim)     rash  . Sulfa Antibiotics Itching  . Valium Other (See Comments)    headache  . Vicodin (Hydrocodone-Acetaminophen) Other (See Comments)    headache  . Amoxicillin Hives and Rash  . Darvocet (Propoxyphene-Acetaminophen) Rash and Other (See Comments)    headache  . Fentanyl And Related Rash  . Wellbutrin (Bupropion Hcl) Rash    Patient Measurements: Height: 5\' 3"  (160 cm) Weight: 220 lb 0.3 oz (99.8 kg) IBW/kg (Calculated) : 52.4  Vital Signs: Temp: 99.8 F (37.7 C) (03/11 1400) Temp src: Oral (03/11 1400) BP: 154/59 mmHg (03/11 1430) Pulse Rate: 96 (03/11 1430)  Labs:  Last labs: Scr 1.22 on 3/6;  WBC 9.7 on 2/26    Estimated Creatinine Clearance: 56 ml/min (by C-G formula based on Cr of 1.22).  Microbiology: Recent Results (from the past 720 hour(s))  CULTURE, BLOOD (ROUTINE X 2)     Status: None   Collection Time    07/09/12  4:30 PM      Result Value Range Status   Specimen Description BLOOD RIGHT ANTECUBITAL   Final   Special Requests BOTTLES DRAWN AEROBIC AND ANAEROBIC 10CC   Final   Culture  Setup Time 07/09/2012 22:58   Final   Culture NO GROWTH 5 DAYS   Final   Report Status 07/15/2012 FINAL   Final  CULTURE, BLOOD (ROUTINE X 2)     Status: None   Collection Time    07/09/12  4:40 PM   Result Value Range Status   Specimen Description BLOOD LEFT ANTECUBITAL   Final   Special Requests BOTTLES DRAWN AEROBIC AND ANAEROBIC 10CC   Final   Culture  Setup Time 07/09/2012 22:58   Final   Culture NO GROWTH 5 DAYS   Final   Report Status 07/15/2012 FINAL   Final   Assessment:  s/p repair of CSF leak, reexploration of lumbar wound. Initial surgery 3/3, had 48-hours of Vancomycin post-op. Tissue cultures sent.  Lumbar drain in place. Multiple antibiotic allergies.  Vancomcyin 1 gram IV given pre-op at 10:30am.  Goal of Therapy:  Vancomycin trough level 15-20 mcg/ml  Plan:   Vancomcyin 750 mg IV q12hrs.  Will check bmet and CBC in am.  Will follow renal function, culture data and clinical course.  Dennie Fetters, [place] Pager: [phone]  07/15/2012,2:50 PM

## 2012-07-15 NOTE — Anesthesia Preprocedure Evaluation (Addendum)
Anesthesia Evaluation  Patient identified by MRN, date of birth, ID band Patient awake    Reviewed: Allergy & Precautions, H&P , NPO status , Patient's Chart, lab work & pertinent test results  History of Anesthesia Complications (+) PONVNegative for: history of anesthetic complications  Airway Mallampati: II TM Distance: >3 FB Neck ROM: full    Dental  (+) Teeth Intact and Dental Advisory Given   Pulmonary asthma , sleep apnea ,          Cardiovascular hypertension, Rhythm:regular Rate:Normal     Neuro/Psych  Headaches,    GI/Hepatic GERD-  ,  Endo/Other  diabetes, Type 2, Oral Hypoglycemic Agents  Renal/GU      Musculoskeletal   Abdominal   Peds  Hematology   Anesthesia Other Findings   Reproductive/Obstetrics                          Anesthesia Physical Anesthesia Plan  ASA: III  Anesthesia Plan: General   Post-op Pain Management:    Induction: Intravenous  Airway Management Planned: Oral ETT  Additional Equipment:   Intra-op Plan:   Post-operative Plan: Extubation in OR  Informed Consent: I have reviewed the patients History and Physical, chart, labs and discussed the procedure including the risks, benefits and alternatives for the proposed anesthesia with the patient or authorized representative who has indicated his/her understanding and acceptance.     Plan Discussed with: CRNA, Anesthesiologist and Surgeon  Anesthesia Plan Comments:         Anesthesia Quick Evaluation

## 2012-07-15 NOTE — Progress Notes (Signed)
RT placed patient on cpap via patient's home circuit and nasal pillow mask on 12cmH20. Patient is tolerating cpap well at this time.

## 2012-07-15 NOTE — Transfer of Care (Signed)
Immediate Anesthesia Transfer of Care Note  Patient: [name]  Procedure(s) Performed: Procedure(s) with comments: I & D of Lumbar Wound: Possible Repair of CSF Leak (N/A) - I & D of Lumbar Wound: Possible Repair of CSF Leak placement lumbar drain  Patient Location: PACU  Anesthesia Type:General  Level of Consciousness: awake, alert  and oriented  Airway & Oxygen Therapy: Patient Spontanous Breathing and Patient connected to nasal cannula oxygen  Post-op Assessment: Report given to PACU RN and Post -op Vital signs reviewed and stable  Post vital signs: Reviewed and stable  Complications: No apparent anesthesia complications

## 2012-07-15 NOTE — Progress Notes (Signed)
Patient ID: [name], female   DOB: 1950-06-14, 62 y.o.   MRN: [id number] Patient is doing okay this morning no new complaints headache is about the same the drainage has remained the same throughout the evening still saturating the bottom part of her dressing. Still I see clear fluid come out of the interest of the incision consistent with a CSF leak. Plan exploration and I&D of lumbar wound today.

## 2012-07-15 NOTE — Progress Notes (Signed)
Pt complained about increased pressure in bladder; tried to urinate twice and was unsuccessful. Bladder scan showed 400cc in bladder. In and Out cathed the patient and received 500 cc of urine. Will continue to monitor.

## 2012-07-15 NOTE — Anesthesia Postprocedure Evaluation (Signed)
  Anesthesia Post-op Note  Patient: [name]  Procedure(s) Performed: Procedure(s) with comments: I & D of Lumbar Wound: Possible Repair of CSF Leak (N/A) - I & D of Lumbar Wound: Possible Repair of CSF Leak placement lumbar drain  Patient Location: PACU  Anesthesia Type:General  Level of Consciousness: awake, alert , oriented and patient cooperative  Airway and Oxygen Therapy: Patient Spontanous Breathing  Post-op Pain: mild  Post-op Assessment: Post-op Vital signs reviewed, Patient's Cardiovascular Status Stable, Respiratory Function Stable, Patent Airway, No signs of Nausea or vomiting and Pain level controlled  Post-op Vital Signs: stable  Complications: No apparent anesthesia complications

## 2012-07-16 LAB — GLUCOSE, CAPILLARY
Glucose-Capillary: 127 mg/dL — ABNORMAL HIGH (ref 70–99)
Glucose-Capillary: 192 mg/dL — ABNORMAL HIGH (ref 70–99)
Glucose-Capillary: 146 mg/dL — ABNORMAL HIGH (ref 70–99)
Glucose-Capillary: 120 mg/dL — ABNORMAL HIGH (ref 70–99)

## 2012-07-16 LAB — BASIC METABOLIC PANEL
BUN: 21 mg/dL (ref 6–23)
Calcium: 8.7 mg/dL (ref 8.4–10.5)
Creatinine, Ser: 0.98 mg/dL (ref 0.50–1.10)
GFR calc Af Amer: 72 mL/min — ABNORMAL LOW (ref >90)
GFR calc non Af Amer: 62 mL/min — ABNORMAL LOW (ref >90)
Glucose, Bld: 111 mg/dL — ABNORMAL HIGH (ref 70–99)

## 2012-07-16 LAB — CBC
HCT: 30.5 % — ABNORMAL LOW (ref 36.0–46.0)
MCH: 31.8 pg (ref 26.0–34.0)
MCHC: 33.4 g/dL (ref 30.0–36.0)
MCV: 95 fL (ref 78.0–100.0)
RDW: 12.9 % (ref 11.5–15.5)

## 2012-07-16 MED ORDER — WHITE PETROLATUM GEL
Status: AC
  Filled 2012-07-16: qty 5

## 2012-07-16 MED ORDER — HALOPERIDOL LACTATE 5 MG/ML IJ SOLN
INTRAMUSCULAR | Status: AC
  Filled 2012-07-16: qty 1

## 2012-07-16 MED ORDER — ACETAMINOPHEN 325 MG PO TABS: 650.0000 mg | ORAL_TABLET | ORAL | Status: DC | PRN

## 2012-07-16 MED ORDER — ACETAMINOPHEN 650 MG RE SUPP: 650.0000 mg | RECTAL | Status: DC | PRN

## 2012-07-16 NOTE — Progress Notes (Signed)
Pt complains of mild pain but wanted to save her second demerol for later this evening. Notified Dr. Wynetta Emery; ordered to give tylenol for mild pain as well. Will continue to monitor.

## 2012-07-16 NOTE — Progress Notes (Signed)
UR completed 

## 2012-07-16 NOTE — Progress Notes (Signed)
PT Cancellation Note  Patient Details Name: AJIAH MCGLINN MRN: [id number] DOB: 07/06/1950   Cancelled Treatment:    Reason Eval/Treat Not Completed: Medical issues which prohibited therapy (pt on bedrest due to CSF leak.  Will await activity orders)   Lurena Joiner B. Evie Croston, PT, DPT [phone]   07/16/2012, 10:35 AM

## 2012-07-16 NOTE — Progress Notes (Signed)
Subjective: Patient reports Overall she's doing fine no leg pain back is sore and chisel the headache but otherwise tolerating okay  Objective: Vital signs in last 24 hours: Temp:  [98 F (36.7 C)-100 F (37.8 C)] 100 F (37.8 C) (03/12 0800) Pulse Rate:  [59-96] 93 (03/12 0800) Resp:  [11-28] 22 (03/12 0800) BP: (100-158)/(41-119) 146/63 mmHg (03/12 0800) SpO2:  [90 %-99 %] 95 % (03/12 0800) Weight:  [99.8 kg (220 lb 0.3 oz)] 99.8 kg (220 lb 0.3 oz) (03/11 1400)  Intake/Output from previous day: 03/11 0701 - 03/12 0700 In: 2625 [I.V.:2225; IV Piggyback:400] Out: 1746 [Urine:1500; Drains:226; Blood:20] Intake/Output this shift: Total I/O In: -  Out: 65 [Urine:45; Drains:20]  Awake alert oriented strength 5 out of 5 wound is clean and dry lumbar drain seems to be adequate output  Lab Results:  Recent Labs  07/16/12 0455  WBC 14.3*  HGB 10.2*  HCT 30.5*  PLT 331   BMET  Recent Labs  07/16/12 0455  NA 133*  K 4.7  CL 97  CO2 26  GLUCOSE 111*  BUN 21  CREATININE 0.98  CALCIUM 8.7    Studies/Results: No results found.  Assessment/Plan: Continue bedrest with lumbar drainage approximately 4 days may remove it on Saturday  LOS: 9 days  Continue therapy   CRAM,GARY P 07/16/2012, 8:41 AM

## 2012-07-16 NOTE — Progress Notes (Signed)
RT placed patient on cpap 12cmH20 via paitent's nasal pillow mask and circuit. Patient is tolerating cpap well at this time. RT will continuet to monitor.

## 2012-07-16 NOTE — Progress Notes (Signed)
Spoke with Dr. Phoebe Perch about patient being unable to void with urinary retention. Patient has been in and out cath'd x2. Received orders to place a foley catheter.

## 2012-07-17 LAB — WOUND CULTURE
Culture: NO GROWTH
Gram Stain: NONE SEEN

## 2012-07-17 LAB — GLUCOSE, CAPILLARY

## 2012-07-17 NOTE — Progress Notes (Signed)
PT Cancellation Note  Patient Details Name: [name] MRN: [id number] DOB: [birth year]   Cancelled Treatment:    Reason Eval/Treat Not Completed: Medical issues which prohibited therapy (pt continues to be on bedrest, per MD note for one more day.)  PT to check back tomorrow to see if pt is off of bedrest.     Lurena Joiner B. Med[place], PT, DPT [phone]   07/17/2012, 8:37 AM

## 2012-07-17 NOTE — Progress Notes (Signed)
Subjective: Patient reports She's doing fine minimal headache no leg pain back pain is well controlled  Objective: Vital signs in last 24 hours: Temp:  [98.6 F (37 C)-100 F (37.8 C)] 99.2 F (37.3 C) (03/13 0406) Pulse Rate:  [72-93] 80 (03/13 0500) Resp:  [14-22] 14 (03/13 0500) BP: (107-159)/(51-116) 135/61 mmHg (03/13 0500) SpO2:  [94 %-99 %] 98 % (03/13 0500)  Intake/Output from previous day: 03/12 0701 - 03/13 0700 In: 1950 [I.V.:1650; IV Piggyback:300] Out: 4221 [Urine:3940; Drains:281] Intake/Output this shift:    Strength is 5 out of 5 wound is clean and dry bandages date dry  Lab Results:  Recent Labs  07/16/12 0455  WBC 14.3*  HGB 10.2*  HCT 30.5*  PLT 331   BMET  Recent Labs  07/16/12 0455  NA 133*  K 4.7  CL 97  CO2 26  GLUCOSE 111*  BUN 21  CREATININE 0.98  CALCIUM 8.7    Studies/Results: No results found.  Assessment/Plan: Continued lumbar drainage until Saturday discontinued the drain Saturday keep patient flat 1 more day and immobilize  LOS: 10 days     CRAM,GARY P 07/17/2012, 7:24 AM

## 2012-07-17 NOTE — Progress Notes (Signed)
Lumbar drain output has gradually decreased since 0700. The past hour the drain had no output. Lowered the lumbar drain and raised the patients bed up and checked for kinks. The drain does not appear to be tidaling. Dr. Wynetta Emery notified; No new orders received at this time. Will continue to monitor.

## 2012-07-18 LAB — GLUCOSE, CAPILLARY
Glucose-Capillary: 114 mg/dL — ABNORMAL HIGH (ref 70–99)
Glucose-Capillary: 81 mg/dL (ref 70–99)
Glucose-Capillary: 129 mg/dL — ABNORMAL HIGH (ref 70–99)
Glucose-Capillary: 173 mg/dL — ABNORMAL HIGH (ref 70–99)

## 2012-07-18 MED ORDER — PROMETHAZINE HCL 25 MG/ML IJ SOLN
12.5000 mg | Freq: Four times a day (QID) | INTRAMUSCULAR | Status: DC | PRN
  Administered 2012-07-18 – 2012-07-19 (×2): 25 mg via INTRAVENOUS
  Filled 2012-07-18 (×3): qty 1

## 2012-07-18 MED ORDER — PROMETHAZINE HCL 25 MG PO TABS
12.5000 mg | ORAL_TABLET | Freq: Four times a day (QID) | ORAL | Status: DC | PRN
  Filled 2012-07-18: qty 1

## 2012-07-18 MED ORDER — DIPHENOXYLATE-ATROPINE 2.5-0.025 MG PO TABS
1.0000 | ORAL_TABLET | Freq: Four times a day (QID) | ORAL | Status: DC | PRN
  Filled 2012-07-18: qty 1

## 2012-07-18 NOTE — Progress Notes (Signed)
Hypoglycemic Event  CBG: 37  Treatment:15 gram carbohydrate snack   Symptoms: sweats  Follow-up CBG: Time:0210 CBG Result:76     FURR, MARIANNA B  Remember to initiate Hypoglycemia Order Set & complete

## 2012-07-18 NOTE — Progress Notes (Signed)
Inpatient Diabetes Program Recommendations  AACE/ADA: New Consensus Statement on Inpatient Glycemic Control  Target Ranges:  Prepandial:   less than 140 mg/dL      Peak postprandial:   less than 180 mg/dL (1-2 hours)      Critically ill patients:  140 - 180 mg/dL  Pager:  [phone] Hours:  8 am-10pm   Reason for Visit: Hypoglycemia:  37 mg/dL at midnight  Inpatient Diabetes Program Recommendations  Oral Agents: Reduce Amaryl or change timing based on hypoglycemic event   Alfredia Client PhD, RN, BC-ADM Diabetes Coordinator  Office:  [phone] Team Pager:  [phone]

## 2012-07-18 NOTE — Progress Notes (Signed)
ANTIBIOTIC CONSULT NOTE - FOLLOW UP  Pharmacy Consult for Vancomycin Indication: post-op coverage while drain in place  Patient Measurements: Height: 5\' 3"  (160 cm) Weight: 220 lb 0.3 oz (99.8 kg) IBW/kg (Calculated) : 52.4  Vital Signs: Temp: 98.1 F (36.7 C) (03/14 0400) Temp src: Oral (03/14 0400) BP: 135/61 mmHg (03/14 0900) Pulse Rate: 76 (03/14 0900) Intake/Output from previous day: 03/13 0701 - 03/14 0700 In: 2323 [P.O.:220; I.V.:1803; IV Piggyback:300] Out: 2724 [Urine:2455; Drains:269] Intake/Output from this shift: Total I/O In: 150 [I.V.:150] Out: 16 [Drains:16]  Labs:  Recent Labs  07/16/12 0455  WBC 14.3*  HGB 10.2*  PLT 331  CREATININE 0.98   Estimated Creatinine Clearance: 69.7 ml/min (by C-G formula based on Cr of 0.98).  Assessment:  Day # 4 Vancomycin.  Intra-operative cultures negative.  Tmax 99, WBC 14.3 on 3/12.  Lumbar drain in place, may be removed 3/15.   Goal of Therapy:  Vancomycin trough level 15-20 mcg/ml  Plan:   Continue Vancomycin 750 mg IV q12hrs.  Will recheck CBC and bmet on 3/15.  Will consider checking Vancomycin trough over the weekend if drain is still needed.  Dennie Fetters, [place] Pager: [phone] 07/18/2012,9:51 AM

## 2012-07-18 NOTE — Progress Notes (Signed)
Subjective: Patient reports She is cages a starting her back pain is well controlled  Objective: Vital signs in last 24 hours: Temp:  [97.9 F (36.6 C)-99.7 F (37.6 C)] 98.1 F (36.7 C) (03/14 0400) Pulse Rate:  [68-86] 74 (03/14 0700) Resp:  [13-19] 14 (03/14 0700) BP: (117-164)/(50-75) 160/69 mmHg (03/14 0700) SpO2:  [97 %-100 %] 98 % (03/14 0700)  Intake/Output from previous day: 03/13 0701 - 03/14 0700 In: 2323 [P.O.:220; I.V.:1803; IV Piggyback:300] Out: 2709 [ZOXWR:6045; Drains:254] Intake/Output this shift:    Wound is dry strength is 5 out of 5  Lab Results:  Recent Labs  07/16/12 0455  WBC 14.3*  HGB 10.2*  HCT 30.5*  PLT 331   BMET  Recent Labs  07/16/12 0455  NA 133*  K 4.7  CL 97  CO2 26  GLUCOSE 111*  BUN 21  CREATININE 0.98  CALCIUM 8.7    Studies/Results: No results found.  Assessment/Plan: Continue CSF drainage 1 more day to get a lumbar drain the morning keep her down the rest of the day than start slowly getting her up.  LOS: 11 days     CRAM,GARY P 07/18/2012, 7:34 AM

## 2012-07-18 NOTE — Progress Notes (Signed)
PT Cancellation Note  Patient Details Name: [name] MRN: [id number] DOB: [birth year]   Cancelled Treatment:    Reason Eval/Treat Not Completed: Medical issues which prohibited therapy (bedrest).  Per MD note it looks like one more day of bedrest (today) and then slowly progress activity starting tomorrow AM.  PT will check back in AM.     Lurena Joiner B. Sanora Cunanan, PT, DPT [phone]   07/18/2012, 11:41 AM

## 2012-07-19 ENCOUNTER — Encounter (HOSPITAL_COMMUNITY): Payer: Self-pay | Admitting: Neurosurgery

## 2012-07-19 LAB — CBC
HCT: 31.5 % — ABNORMAL LOW (ref 36.0–46.0)
Platelets: 396 10*3/uL (ref 150–400)
RDW: 12.9 % (ref 11.5–15.5)
WBC: 14.6 10*3/uL — ABNORMAL HIGH (ref 4.0–10.5)

## 2012-07-19 LAB — BASIC METABOLIC PANEL
Chloride: 98 mEq/L (ref 96–112)
Creatinine, Ser: 1 mg/dL (ref 0.50–1.10)
GFR calc Af Amer: 70 mL/min — ABNORMAL LOW (ref >90)
Potassium: 4.6 mEq/L (ref 3.5–5.1)

## 2012-07-19 LAB — GLUCOSE, CAPILLARY
Glucose-Capillary: 85 mg/dL (ref 70–99)
Glucose-Capillary: 144 mg/dL — ABNORMAL HIGH (ref 70–99)

## 2012-07-19 NOTE — Progress Notes (Signed)
PHARMACIST - PHYSICIAN COMMUNICATION  DR:   Gerlene Fee or Neurosurgery  CONCERNING:  Vancomycin post-op while drain in place  RECOMMENDATION: Consider discontinuing vancomycin now that drain has been removed.  [place], [address].D., BCPS Clinical Pharmacist Pager: [phone] 07/19/2012 4:07 PM

## 2012-07-19 NOTE — Progress Notes (Signed)
PT Cancellation Note  Patient Details Name: [name] MRN: [id number] DOB: 20-Apr-1951   Cancelled Treatment:    Reason Eval/Treat Not Completed: Medical issues which prohibited therapy (pt still on bedrest)   Micky Sheller 07/19/2012, 9:14 AM

## 2012-07-19 NOTE — Progress Notes (Signed)
Placed patient on CPAP at 11cm. Sp02=97%

## 2012-07-19 NOTE — Progress Notes (Signed)
Patient ID: [name], female   DOB: Jun 19, 1950, 62 y.o.   MRN: [id number] Subjective: Patient reports ready to get up  Objective: Vital signs in last 24 hours: Temp:  [97.9 F (36.6 C)-99.1 F (37.3 C)] 97.9 F (36.6 C) (03/15 0419) Pulse Rate:  [71-92] 73 (03/15 0700) Resp:  [11-20] 15 (03/15 0700) BP: (116-193)/(49-123) 154/59 mmHg (03/15 0700) SpO2:  [95 %-100 %] 97 % (03/15 0700)  Intake/Output from previous day: 03/14 0701 - 03/15 0700 In: 2220 [P.O.:120; I.V.:1800; IV Piggyback:300] Out: 2119 [Urine:1825; Drains:294] Intake/Output this shift:    Wound:clean and dry  Lab Results:  Recent Labs  07/19/12 0601  WBC 14.6*  HGB 10.9*  HCT 31.5*  PLT 396   BMET  Recent Labs  07/19/12 0601  NA 135  K 4.6  CL 98  CO2 25  GLUCOSE 75  BUN 21  CREATININE 1.00  CALCIUM 9.2    Studies/Results: No results found.  Assessment/Plan: Doing well. Removed lumbar drain. Will slowly start to increase activity.   LOS: 12 days  as above   Reinaldo Meeker, MD 07/19/2012, 9:02 AM

## 2012-07-19 NOTE — Progress Notes (Signed)
Patient c/o n/v with minimal relief from Zofran.  Patient also c/o diarrhea. Dr. Gerlene Fee notified. New orders placed. See MAR. 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 Dr. Gerlene Fee made aware just to be cautious and he was unconcerned since patient still remains neuro intact with vitals WNL.  Will continue to monitor n/v and diarrhea.  Jacqulynn Cadet

## 2012-07-20 LAB — ANAEROBIC CULTURE

## 2012-07-20 LAB — GLUCOSE, CAPILLARY
Glucose-Capillary: 167 mg/dL — ABNORMAL HIGH (ref 70–99)
Glucose-Capillary: 165 mg/dL — ABNORMAL HIGH (ref 70–99)

## 2012-07-20 NOTE — Progress Notes (Signed)
Physical Therapy Treatment Patient Details Name: [name] MRN: [id number] DOB: Sep 30, 1950 Today's Date: 07/20/2012 Time: 8295-6213 PT Time Calculation (min): 16 min  PT Assessment / Plan / Recommendation Comments on Treatment Session  Patient moving very well today.  Performs mobility and gait while maintaining back precautions.  Updated goals from 3/5 PT eval due to surgery on 3/11 with bedrest until today.    Follow Up Recommendations  No PT follow up;Supervision - Intermittent     Does the patient have the potential to tolerate intense rehabilitation     Barriers to Discharge        Equipment Recommendations  None recommended by PT    Recommendations for Other Services    Frequency Min 5X/week   Plan Discharge plan remains appropriate    Precautions / Restrictions Precautions Precautions: Back;Fall Precaution Comments: Patient recalled 3/3 back precautions. Required Braces or Orthoses: Spinal Brace Spinal Brace: Lumbar corset;Applied in sitting position (Patient able to don independently) Restrictions Weight Bearing Restrictions: No   Pertinent Vitals/Pain     Mobility  Bed Mobility Bed Mobility: Rolling Right;Right Sidelying to Sit;Sitting - Scoot to Edge of Bed Rolling Right: 5: Supervision;With rail Right Sidelying to Sit: 5: Supervision;With rails;HOB flat Sitting - Scoot to Edge of Bed: 6: Modified independent (Device/Increase time);With rail Details for Bed Mobility Assistance: Patient recalled technique for log rolling and coming to sitting while maintaining back precautions. Transfers Transfers: Sit to Stand;Stand to Sit Sit to Stand: 5: Supervision;With upper extremity assist;From bed Stand to Sit: 5: Supervision;With upper extremity assist;With armrests;To chair/3-in-1 Details for Transfer Assistance: Verbal cues for hand placement.  Good technique. Ambulation/Gait Ambulation/Gait Assistance: 5: Supervision Ambulation Distance (Feet): 182  Feet Assistive device: Rolling walker Ambulation/Gait Assistance Details: Patient using good technique with RW.  Cues to move at safe speed. Gait Pattern: Within Functional Limits Gait velocity: Slow gait speed Stairs: No (Reports she has practiced and does not need to review)      PT Goals Acute Rehab PT Goals PT Goal Formulation: With patient Time For Goal Achievement: 07/26/12 Potential to Achieve Goals: Good Pt will Roll Supine to Right Side: Independently PT Goal: Rolling Supine to Right Side - Progress: Updated due to goal met Pt will go Supine/Side to Sit: Independently;with HOB 0 degrees PT Goal: Supine/Side to Sit - Progress: Updated due to goal met Pt will go Sit to Supine/Side: Independently;with HOB 0 degrees PT Goal: Sit to Supine/Side - Progress: Updated due to goal met PT Goal: Ambulate - Progress: Progressing toward goal PT Goal: Up/Down Stairs - Progress: Met  Visit Information  Last PT Received On: 07/20/12 Assistance Needed: +1    Subjective Data  Subjective: "I've been up today and walked"  "I practiced the stairs when I was on the other floor" Patient Stated Goal: To be able to go home soon.   Cognition  Cognition Overall Cognitive Status: Appears within functional limits for tasks assessed/performed Arousal/Alertness: Awake/alert Orientation Level: Oriented X4 / Intact Behavior During Session: WFL for tasks performed    Balance     End of Session PT - End of Session Equipment Utilized During Treatment: Gait belt;Back brace Activity Tolerance: Patient tolerated treatment well Patient left: in chair;with call bell/phone within reach Nurse Communication: Mobility status   GP     Vena Austria 07/20/2012, 12:26 PM Durenda Hurt. Renaldo Fiddler, [facility] Acute Rehab Services Pager [phone]

## 2012-07-20 NOTE — Progress Notes (Signed)
Patient ID: [name], female   DOB: [birth year], 62 y.o.   MRN: [id number] Afeb, vss. Neuro without change Foley not yet removed. Will d/c foley now and start to get up to bathroom. Will transfer to floor today. Hopefully home soon.

## 2012-07-21 LAB — CBC
HCT: 34 % — ABNORMAL LOW (ref 36.0–46.0)
Hemoglobin: 11.9 g/dL — ABNORMAL LOW (ref 12.0–15.0)
MCH: 32.3 pg (ref 26.0–34.0)
MCHC: 35 g/dL (ref 30.0–36.0)

## 2012-07-21 LAB — GLUCOSE, CAPILLARY: Glucose-Capillary: 271 mg/dL — ABNORMAL HIGH (ref 70–99)

## 2012-07-21 MED ORDER — RISAQUAD PO CAPS
2.0000 | ORAL_CAPSULE | Freq: Every day | ORAL | Status: DC
  Administered 2012-07-21 – 2012-07-22 (×2): 2 via ORAL
  Filled 2012-07-21 (×2): qty 2

## 2012-07-21 MED ORDER — DIPHENHYDRAMINE HCL 25 MG PO CAPS
50.0000 mg | ORAL_CAPSULE | Freq: Four times a day (QID) | ORAL | Status: DC | PRN
  Administered 2012-07-21: 50 mg via ORAL
  Filled 2012-07-21 (×2): qty 2

## 2012-07-21 MED ORDER — DEXAMETHASONE SODIUM PHOSPHATE 4 MG/ML IJ SOLN
4.0000 mg | Freq: Once | INTRAMUSCULAR | Status: AC
  Administered 2012-07-21: 4 mg via INTRAVENOUS
  Filled 2012-07-21: qty 1

## 2012-07-21 MED ORDER — METHYLPREDNISOLONE SODIUM SUCC 125 MG IJ SOLR
125.0000 mg | Freq: Once | INTRAMUSCULAR | Status: AC
  Administered 2012-07-21: 125 mg via INTRAVENOUS
  Filled 2012-07-21: qty 2

## 2012-07-21 NOTE — Progress Notes (Signed)
Call placed to Dr. Wynetta Emery office, informed that he will be out of the office all week, that Dr. Jeral Fruit was covering but he was in OR and that Dr. Gerlene Fee will come by and see the patient.  Patient concerned about the increase swelling in face with rash, itching and swelling in hands.

## 2012-07-21 NOTE — Progress Notes (Signed)
Physical Therapy Treatment Patient Details Name: [name] MRN: [id number] DOB: 1951/03/15 Today's Date: 07/21/2012 Time: 4403-4742 PT Time Calculation (min): 17 min  PT Assessment / Plan / Recommendation Comments on Treatment Session  Pt had edema in face and arms during this session which she reports being allergic reaction to medication over night.  Pt moving well-- Pt able to maintain back precautions with mobility.  Pt educated and demonstrated general LE exercise to help maintain strength.    Follow Up Recommendations  No PT follow up;Supervision - Intermittent     Does the patient have the potential to tolerate intense rehabilitation     Barriers to Discharge        Equipment Recommendations  None recommended by PT    Recommendations for Other Services    Frequency Min 5X/week   Plan Discharge plan remains appropriate    Precautions / Restrictions Precautions Precautions: Back;Fall Precaution Comments: Patient recalled 3/3 back precautions. Required Braces or Orthoses: Spinal Brace Spinal Brace: Lumbar corset;Applied in sitting position Restrictions Weight Bearing Restrictions: No   Pertinent Vitals/Pain Pt had edema in face and arms.  Pt indicated it had started last night.  Pt reported she was not in any pain.    Mobility  Bed Mobility Bed Mobility: Not assessed Transfers Transfers: Stand to Sit Stand to Sit: 5: Supervision;With upper extremity assist;To chair/3-in-1;With armrests Details for Transfer Assistance: Pt demonstrated proper technique. Ambulation/Gait Ambulation/Gait Assistance: 5: Supervision Ambulation Distance (Feet): 600 Feet Assistive device: Rolling walker Ambulation/Gait Assistance Details: Pt has good posture & safe use of RW. Gait Pattern: Within Functional Limits Gait velocity: Slow gait speed Stairs: No (Pt deferring stairs at this time due to she states she she had already worked on stairs previously & feels comfortable with  doing them.)    Exercises General Exercises - Lower Extremity Quad Sets: AROM;Strengthening;Both;20 reps;Seated Long Arc Quad: AROM;Strengthening;Both;20 reps;Seated Hip ABduction/ADduction: AROM;Strengthening;20 reps;Seated;Both Straight Leg Raises: AROM;Both;20 reps;Seated     PT Goals Acute Rehab PT Goals Time For Goal Achievement: 07/26/12 Potential to Achieve Goals: Good Pt will Ambulate: >150 feet;with modified independence;with rolling walker PT Goal: Ambulate - Progress: Progressing toward goal  Visit Information  Last PT Received On: 07/21/12 Assistance Needed: +1    Subjective Data  Subjective: No pain since release from ICU.  Tired of the weather and tired of being in hospital.   Cognition  Cognition Overall Cognitive Status: Appears within functional limits for tasks assessed/performed Arousal/Alertness: Awake/alert Orientation Level: Oriented X4 / Intact Behavior During Session: [facility] for tasks performed    Balance     End of Session PT - End of Session Equipment Utilized During Treatment: Gait belt;Back brace Activity Tolerance: Patient tolerated treatment well Patient left: in chair;with call bell/phone within reach;with family/visitor present Nurse Communication: Mobility status   GP     Enid Baas, SPTA 07/21/2012, 2:25 PM   Verdell Face, PTA [phone] 07/21/2012

## 2012-07-21 NOTE — Progress Notes (Signed)
@  2100 Patient's BP was 81/50 HR-97, asymptomatic. Dr. Jeral Fruit (on call MD) was notified and ordered CBC stat. @2200  BP manually rechecked and went up to 130/80 HR-80. CBC result was relayed to Dr. Jeral Fruit and no further orders given. She denies any pain or discomfort. Will continue to monitor.

## 2012-07-21 NOTE — Progress Notes (Signed)
Subjective: Patient reports Set of rash this morning patient with erythematous maculopapular rash in blotches about trunk and extremities. Significant itching noted. Patient has not been on any antibiotics.   Objective: Vital signs in last 24 hours: Temp:  [97.3 F (36.3 C)-98.9 F (37.2 C)] 98.9 F (37.2 C) (03/17 1409) Pulse Rate:  [90-108] 98 (03/17 1409) Resp:  [18] 18 (03/17 1409) BP: (90-109)/(46-55) 90/46 mmHg (03/17 1409) SpO2:  [96 %-100 %] 99 % (03/17 1409)  Intake/Output from previous day: 03/16 0701 - 03/17 0700 In: 390 [P.O.:240; IV Piggyback:150] Out: -  Intake/Output this shift:    Maculopapular rash in blotches on trunk and upper extremities with swelling about the face puffiness and eyelids. All 4 extremities involved. Patient notes rashes severely paretic  Lab Results:  Recent Labs  07/19/12 0601  WBC 14.6*  HGB 10.9*  HCT 31.5*  PLT 396   BMET  Recent Labs  07/19/12 0601  NA 135  K 4.6  CL 98  CO2 25  GLUCOSE 75  BUN 21  CREATININE 1.00  CALCIUM 9.2    Studies/Results: No results found.  Assessment/Plan: Recent rash. Vital signs have been noted to show decrease in blood pressure.  LOS: 14 days  T. the singular dose of IV Solu-Medrol. Will outpatient to shower. Observe blood pressure.   Kingslee Dowse J 07/21/2012, 2:52 PM

## 2012-07-21 NOTE — Progress Notes (Signed)
Dr. Danielle Dess into see patient notified of patients BP 90/45, new order received.

## 2012-07-21 NOTE — Progress Notes (Signed)
Dr Jeral Fruit notified re: facial edema left eye/cheek, hives on right arm, right hand and back,  Patient denies SOB or difficulty swallowing. No known new med, food or environmental cause.  New orders received and initiated.  Etter Sjogren RN

## 2012-07-22 LAB — GLUCOSE, CAPILLARY: Glucose-Capillary: 240 mg/dL — ABNORMAL HIGH (ref 70–99)

## 2012-07-22 MED ORDER — MEPERIDINE HCL 50 MG PO TABS: 50.0000 mg | ORAL_TABLET | ORAL | Status: DC | PRN

## 2012-07-22 NOTE — Progress Notes (Signed)
Physical Therapy Treatment Patient Details Name: [name] MRN: [id number] DOB: July 11, 1950 Today's Date: 07/22/2012 Time: 0802-0825 PT Time Calculation (min): 23 min  PT Assessment / Plan / Recommendation Comments on Treatment Session  Pt moving very well at this time.  (S) level for sit<>stand transfers & ambulation.  Pt has been observed ambulating in hallway with husband.  Pt was able to perform & tolerate higher level balance activities with no LOB noted.    Pt able to demonstrate general LE exercises with good form.  Added seated glute sets.  Pt requested written exercises.   Will f/u one more time to ensure consistency with mobility but pt will more than likely be appropriate to d/c from PT services next session.      Follow Up Recommendations  No PT follow up;Supervision - Intermittent     Does the patient have the potential to tolerate intense rehabilitation     Barriers to Discharge        Equipment Recommendations  None recommended by PT    Recommendations for Other Services    Frequency Min 5X/week   Plan Discharge plan remains appropriate    Precautions / Restrictions Precautions Precautions: Back;Fall Precaution Comments: Patient recalled 3/3 back precautions.  Pt compliant with back precautions during mobility. Required Braces or Orthoses: Spinal Brace Spinal Brace: Lumbar corset;Applied in sitting position Restrictions Weight Bearing Restrictions: No   Pertinent Vitals/Pain Pt reports no pain.  Pt pleasant and willing to participate in PT.    Mobility  Bed Mobility Bed Mobility: Not assessed Transfers Transfers: Stand to Sit Stand to Sit: 5: Supervision;With upper extremity assist;To chair/3-in-1;With armrests Details for Transfer Assistance: Pt demonstrates safe and proper technique. Ambulation/Gait Ambulation/Gait Assistance: 5: Supervision Ambulation Distance (Feet): 600 Feet Assistive device: Rolling walker Ambulation/Gait Assistance Details: Pt  demonstrates good posture & safe use of RW.  Pt gait velocity increased this session. Gait Pattern: Within Functional Limits    Exercises General Exercises - Lower Extremity Quad Sets: AROM;Strengthening;Both;20 reps;Seated Gluteal Sets: AROM;Both;20 reps;Seated Long Arc Quad: AROM;Strengthening;Both;20 reps;Seated Hip ABduction/ADduction: AROM;Strengthening;20 reps;Seated;Both Straight Leg Raises: AROM;Both;20 reps;Seated     PT Goals Acute Rehab PT Goals Time For Goal Achievement: 07/26/12 Potential to Achieve Goals: Good Pt will Ambulate: >150 feet;with modified independence;with rolling walker PT Goal: Ambulate - Progress: Met Additional Goals Additional Goal #1: Pt independent with recall of back precautions and 100% compliant PT Goal: Additional Goal #1 - Progress: Met  Visit Information  Last PT Received On: 07/22/12 Assistance Needed: +1    Subjective Data  Subjective: Pt reports no pain, still has facial swelling but not itching.  Pt willing to participate in PT.   Cognition  Cognition Overall Cognitive Status: Appears within functional limits for tasks assessed/performed Arousal/Alertness: Awake/alert Orientation Level: Oriented X4 / Intact Behavior During Session: WFL for tasks performed    Balance  Balance Balance Assessed: Yes Static Standing Balance Static Standing - Balance Support: No upper extremity supported Static Standing - Level of Assistance: 6: Modified independent (Device/Increase time) Static Standing - Comment/# of Minutes: 2 (Pt able to maintain with isometric perturbations.) Rhomberg - Eyes Opened: 1 Rhomberg - Eyes Closed: 1 Dynamic Standing Balance Dynamic Standing - Balance Support: Bilateral upper extremity supported;During functional activity Dynamic Standing - Level of Assistance: 6: Modified independent (Device/Increase time) Dynamic Standing - Balance Activities: Lateral lean/weight shifting;Forward lean/weight shifting;Other  (comment) (increasing speed, sudden stops, obstacles) Dynamic Standing - Comments: Pt demonstrates good dynamic standing balance.  End of Session PT -  End of Session Equipment Utilized During Treatment: Gait belt;Back brace Activity Tolerance: Patient tolerated treatment well Patient left: in chair;with call bell/phone within reach;with family/visitor present Nurse Communication: Mobility status   GP     Enid Baas, SPTA 07/22/2012, 9:05 AM  Verdell Face, PTA [phone] 07/22/2012

## 2012-07-22 NOTE — Discharge Summary (Signed)
Physician Discharge Summary  Patient ID: [name] MRN: [id number] DOB/AGE: 60-Aug-1952 60 y.o.  Admit date: 07/07/2012 Discharge date: 07/22/2012  Admission Diagnoses: Lumbar spondylosis and stenosis L1-L2. Status post decompression and fusion L2-L4  Discharge Diagnoses: Lumbar spondylosis and stenosis L1-L2. Status post decompression and fusion L2-L4. Inadvertent durotomy with cerebral spinal fluid leakage. Active Problems:   * No active hospital problems. *   Discharged Condition: fair  Hospital Course: Patient was admitted to undergo surgical decompression of severe spondylosis at L1-L2 above previous L2-L4 surgical fusion. Patient had surgery initially on the day of admission March 3 during the surgery there was an inadvertent dural tear which was repaired primarily. The patient was maintained flat bedrest for an initial 72 hour period. She was then mobilized. Initially it appeared that her incision was clean dry and she was doing well however on March 8 just as we are getting ready to discharge the patient it was noted that some spinal fluid presented itself through the incision this was initially treated with some dressing changes and was felt to be contained however on the day of discharge that his March 10 is noted that she had a florid spinal fluid leak. An 11th of March the patient was returned to the operating room where she underwent a secondary revision of the repair with use of a lumbar drain for diversion. The patient was maintained flat in bed for a period of 5 days the drain was then removed and the patient was gradually mobilized her incision has remained clean and dry. The patient has had some issues of a rash that been treated with Benadryl and most recently with Solu-Medrol all her ancillary medications including antibiotics have been stopped. At this time she is discharged home with a small prescription for Demerol which she uses for pain control she has not required any  while in the hospital. Will have her come back to see Dr. Wynetta Emery at the two-week point from surgical revision for surgical suture removal. Her motor function is good in lower extremities she is ambulatory with minimal assistance.  Consults: None  Significant Diagnostic Studies: Preoperative MRI scan  Treatments: Surgical decompression L1-L2 on 07/07/2012, revision posterior spinal fluid leak dural closure 07/15/2012.  Discharge Exam: Blood pressure 121/58, pulse 88, temperature 97.6 F (36.4 C), temperature source Oral, resp. rate 18, height 5\' 3"  (1.6 m), weight 99.8 kg (220 lb 0.3 oz), SpO2 91.00%. Incision is clean and dry. Nylon suture closure intact. Motor function is good in iliopsoas quadricep tibialis anterior and gastrocs all graded at 4+ out of 5. Patient is ambulatory with moderate ataxia.  Disposition: 01-Home or Self Care  Discharge Orders   Future Orders Complete By Expires     Call MD for:  redness, tenderness, or signs of infection (pain, swelling, redness, odor or green/yellow discharge around incision site)  As directed     Call MD for:  severe uncontrolled pain  As directed     Call MD for:  temperature >100.4  As directed     Diet - low sodium heart healthy  As directed     Increase activity slowly  As directed         Medication List    TAKE these medications       albuterol 108 (90 BASE) MCG/ACT inhaler  Commonly known as:  PROVENTIL HFA;VENTOLIN HFA  Inhale 1 puff into the lungs every 4 (four) hours as needed for shortness of breath.     aspirin 81  MG tablet  Take 81 mg by mouth daily.     Biotin 5000 MCG Caps  Take 5,000 mcg by mouth daily.     clobetasol 0.05 % topical foam  Commonly known as:  OLUX  Apply 1 application topically daily as needed (apply to head for dry scalp).     cyclobenzaprine 10 MG tablet  Commonly known as:  FLEXERIL  Take 1 tablet (10 mg total) by mouth 3 (three) times daily as needed for muscle spasms.     CYMBALTA 60 MG  capsule  Generic drug:  DULoxetine  Take 60 mg by mouth daily.     doxycycline 50 MG capsule  Commonly known as:  VIBRAMYCIN  Take 2 capsules (100 mg total) by mouth 2 (two) times daily.     glimepiride 4 MG tablet  Commonly known as:  AMARYL  Take 8 mg by mouth daily after supper.     GREEN COFFEE BEAN PO  Take 1 tablet by mouth daily.     HYDROmorphone 2 MG tablet  Commonly known as:  DILAUDID  Take 1 tablet (2 mg total) by mouth every 4 (four) hours as needed.     HYDROmorphone 2 MG tablet  Commonly known as:  DILAUDID  Take 2 mg by mouth every 4 (four) hours as needed for pain.     lisinopril-hydrochlorothiazide 20-25 MG per tablet  Commonly known as:  PRINZIDE,ZESTORETIC  Take 1 tablet by mouth daily.     LORazepam 1 MG tablet  Commonly known as:  ATIVAN  Take 1 mg by mouth 3 (three) times daily.     meperidine 50 MG tablet  Commonly known as:  DEMEROL  Take 50 mg by mouth 2 (two) times daily as needed for pain.     meperidine 50 MG tablet  Commonly known as:  DEMEROL  Take 1 tablet (50 mg total) by mouth every 4 (four) hours as needed for pain.     metFORMIN 1000 MG tablet  Commonly known as:  GLUCOPHAGE  Take 1,000 mg by mouth 2 (two) times daily with a meal.     MULTI-VITAMIN DAILY PO  Take 1 tablet by mouth daily.     pantoprazole 40 MG tablet  Commonly known as:  PROTONIX  Take 40 mg by mouth daily as needed (for reflux). For acid reflux     simvastatin 40 MG tablet  Commonly known as:  ZOCOR  Take 40 mg by mouth at bedtime.           Follow-up Information   Follow up In 3 days.      SignedStefani Dama 07/22/2012, 10:14 AM

## 2012-07-22 NOTE — Progress Notes (Signed)
Pt d/c to home by car with family. Assessment stable and prescriptions given. Pt verbalizes understanding of d/c instructions.

## 2012-10-31 ENCOUNTER — Telehealth: Payer: Self-pay | Admitting: Hematology & Oncology

## 2012-10-31 NOTE — Telephone Encounter (Signed)
Pt called made 7-31 appointment. I offered her 11-03-12 but she wanted to wait

## 2012-12-03 ENCOUNTER — Telehealth: Payer: Self-pay | Admitting: Hematology & Oncology

## 2012-12-03 NOTE — Telephone Encounter (Signed)
Patient called and cx 12/04/12 apt due to mother passing away.  She resch for 12/29/12

## 2012-12-04 ENCOUNTER — Ambulatory Visit: Payer: Medicare Other | Admitting: Hematology & Oncology

## 2012-12-04 ENCOUNTER — Other Ambulatory Visit: Payer: Medicare Other | Admitting: Lab

## 2012-12-29 ENCOUNTER — Ambulatory Visit (HOSPITAL_BASED_OUTPATIENT_CLINIC_OR_DEPARTMENT_OTHER): Payer: BC Managed Care – PPO | Admitting: Hematology & Oncology

## 2012-12-29 ENCOUNTER — Other Ambulatory Visit (HOSPITAL_BASED_OUTPATIENT_CLINIC_OR_DEPARTMENT_OTHER): Payer: BC Managed Care – PPO | Admitting: Lab

## 2012-12-29 VITALS — BP 150/54 | HR 65 | Temp 98.4°F | Resp 16 | Ht 63.0 in | Wt 223.0 lb

## 2012-12-29 DIAGNOSIS — E119 Type 2 diabetes mellitus without complications: Secondary | ICD-10-CM

## 2012-12-29 DIAGNOSIS — N289 Disorder of kidney and ureter, unspecified: Secondary | ICD-10-CM

## 2012-12-29 DIAGNOSIS — D509 Iron deficiency anemia, unspecified: Secondary | ICD-10-CM

## 2012-12-29 DIAGNOSIS — D631 Anemia in chronic kidney disease: Secondary | ICD-10-CM

## 2012-12-29 DIAGNOSIS — D649 Anemia, unspecified: Secondary | ICD-10-CM

## 2012-12-29 LAB — CBC WITH DIFFERENTIAL (CANCER CENTER ONLY)
BASO#: 0 10*3/uL (ref 0.0–0.2)
EOS%: 0.3 % (ref 0.0–7.0)
Eosinophils Absolute: 0 10*3/uL (ref 0.0–0.5)
HCT: 30.4 % — ABNORMAL LOW (ref 34.8–46.6)
HGB: 10.1 g/dL — ABNORMAL LOW (ref 11.6–15.9)
LYMPH#: 1.9 10*3/uL (ref 0.9–3.3)
MCHC: 33.2 g/dL (ref 32.0–36.0)
MONO#: 0.6 10*3/uL (ref 0.1–0.9)
NEUT%: 67.2 % (ref 39.6–80.0)

## 2012-12-29 NOTE — Progress Notes (Signed)
This office note has been dictated.

## 2012-12-30 LAB — IRON AND TIBC CHCC
%SAT: 28 % (ref 21–57)
Iron: 83 ug/dL (ref 41–142)
TIBC: 294 ug/dL (ref 236–444)

## 2012-12-30 LAB — FERRITIN CHCC: Ferritin: 142 ng/ml (ref 9–269)

## 2012-12-30 NOTE — Progress Notes (Signed)
CC:   Lupita Raider, M.D.  DIAGNOSES: 1. Anemia of renal insufficiency. 2. Intermittent iron-deficiency anemia. 3. Insulin-dependent diabetes.  CURRENT THERAPY: 1. Aranesp 300 mcg subcu as needed for hemoglobin less than 11. 2. IV iron as indicated.  INTERIM HISTORY:  Ms. Venturini was comes in for followup.  Unfortunate, she has had a lot happen to her since we last saw her.  We last saw her back in February.  She did have back surgery.  She had removal of hardware at L1 and L2.  This was in early March.  She then had a postop infection.  She had a CSF leak.  She was hospitalized for 15 days.  She then had her mother pass away just a few weeks ago.  Her mom had hip surgery and apparently had an infection and this took her to heaven. This has been tough for Ms. Ronan.  Last time we saw her, her iron studies showed a ferritin of 246 with an iron saturation of 28%.  The last time she received Aranesp was back in early February when we saw her.  Her last Duncan Dull was also in early February.  This worked incredibly well as her hemoglobin got up to 11.9.  Again, there has been no bleeding.  She has had a decent appetite.  She says that her blood sugars are on the high side.  PHYSICAL EXAMINATION:  General:  This is a fairly well-developed, well- nourished white female in no obvious distress.  Vital signs: Temperature of 98.4, pulse 65, respiratory rate 16, blood pressure 150/54.  Weight is 223.  Head and neck:  Normocephalic, atraumatic skull.  There are no ocular or oral lesions.  There are no palpable cervical or supraclavicular lymph nodes.  Lungs:  Clear bilaterally. Cardiac:  Regular rate and rhythm with a normal S1 and S2.  There are no murmurs, rubs or bruits.  Abdomen:  Soft.  She has good bowel sounds. There is no fluid wave.  There is no palpable hepatosplenomegaly.  Back: A surgical scar in the lumbar spine.  This is healed.  Extremities:  No clubbing, cyanosis or  edema.  Neurological:  No focal neurological deficits.  LABORATORY STUDIES:  White cell count 7.7, hemoglobin 10.1, hematocrit 30.4, platelet count 220.  IMPRESSION:  Ms. Dimitri is a nice 62 year old white female with multifactorial anemia.  I want to hold off iron and Aranesp for right now.  She has had a lot going on.  I think we probably will get her back in a month or so.  By then, I am sure she will probably need both Aranesp and iron.    ______________________________ Josph Macho, M.D. PRE/MEDQ  D:  12/29/2012  T:  12/30/2012  Job:  [id number]

## 2013-01-01 ENCOUNTER — Telehealth: Payer: Self-pay | Admitting: *Deleted

## 2013-01-01 NOTE — Telephone Encounter (Signed)
Called patient to let her know that her labwork was all ok with iron being good as well.  Left message on patient personal  Cell phone

## 2013-01-01 NOTE — Telephone Encounter (Signed)
Message copied by Anselm Jungling on Thu Jan 01, 2013  1:20 PM ------      Message from: Arlan Organ R      Created: Wed Dec 31, 2012  1:25 PM       Call - iron is ok!!  Cindee Lame ------

## 2013-02-09 ENCOUNTER — Other Ambulatory Visit: Payer: Medicare Other | Admitting: Lab

## 2013-02-09 ENCOUNTER — Telehealth: Payer: Self-pay | Admitting: Hematology & Oncology

## 2013-02-09 ENCOUNTER — Ambulatory Visit: Payer: Medicare Other

## 2013-02-09 ENCOUNTER — Ambulatory Visit: Payer: Medicare Other | Admitting: Hematology & Oncology

## 2013-02-09 NOTE — Telephone Encounter (Signed)
Pt called moved 10-6 to 11-10. She said she hurt her neck. I left message with RN may want to check on injection

## 2013-03-16 ENCOUNTER — Other Ambulatory Visit (HOSPITAL_BASED_OUTPATIENT_CLINIC_OR_DEPARTMENT_OTHER): Payer: BC Managed Care – PPO | Admitting: Lab

## 2013-03-16 ENCOUNTER — Ambulatory Visit (HOSPITAL_BASED_OUTPATIENT_CLINIC_OR_DEPARTMENT_OTHER): Payer: BC Managed Care – PPO | Admitting: Hematology & Oncology

## 2013-03-16 ENCOUNTER — Ambulatory Visit: Payer: Medicare Other

## 2013-03-16 VITALS — BP 124/50 | HR 98 | Temp 98.6°F | Resp 14 | Ht 63.0 in | Wt 225.0 lb

## 2013-03-16 DIAGNOSIS — D631 Anemia in chronic kidney disease: Secondary | ICD-10-CM

## 2013-03-16 DIAGNOSIS — N289 Disorder of kidney and ureter, unspecified: Secondary | ICD-10-CM

## 2013-03-16 DIAGNOSIS — D649 Anemia, unspecified: Secondary | ICD-10-CM

## 2013-03-16 DIAGNOSIS — N189 Chronic kidney disease, unspecified: Secondary | ICD-10-CM

## 2013-03-16 DIAGNOSIS — D509 Iron deficiency anemia, unspecified: Secondary | ICD-10-CM

## 2013-03-16 LAB — CBC WITH DIFFERENTIAL (CANCER CENTER ONLY)
Eosinophils Absolute: 0.2 10*3/uL (ref 0.0–0.5)
HCT: 31.7 % — ABNORMAL LOW (ref 34.8–46.6)
LYMPH%: 20 % (ref 14.0–48.0)
MCH: 32 pg (ref 26.0–34.0)
MCV: 97 fL (ref 81–101)
MONO#: 0.7 10*3/uL (ref 0.1–0.9)
MONO%: 6.8 % (ref 0.0–13.0)
NEUT%: 71.3 % (ref 39.6–80.0)
RDW: 12.8 % (ref 11.1–15.7)
WBC: 9.9 10*3/uL (ref 3.9–10.0)

## 2013-03-17 LAB — FERRITIN CHCC: Ferritin: 210 ng/ml (ref 9–269)

## 2013-03-17 LAB — RETICULOCYTES (CHCC)
ABS Retic: 105.1 10*3/uL (ref 19.0–186.0)
RBC.: 3.39 MIL/uL — ABNORMAL LOW (ref 3.87–5.11)
Retic Ct Pct: 3.1 % — ABNORMAL HIGH (ref 0.4–2.3)

## 2013-03-17 LAB — IRON AND TIBC CHCC
%SAT: 35 % (ref 21–57)
Iron: 106 ug/dL (ref 41–142)

## 2013-03-18 ENCOUNTER — Telehealth: Payer: Self-pay | Admitting: Nurse Practitioner

## 2013-03-18 NOTE — Progress Notes (Signed)
This office note has been dictated.

## 2013-03-18 NOTE — Telephone Encounter (Addendum)
Message copied by Glee Arvin on Wed Mar 18, 2013 11:13 AM ------      Message from: Josph Macho      Created: Tue Mar 17, 2013  8:52 PM       Call - iron is ok!! Cindee Lame ------LVM informing pt of iron levels per Dr. Myna Hidalgo. Informed her to please contact our office with any further questions or concerns.

## 2013-03-28 NOTE — Progress Notes (Signed)
CC:   [name], M.D.  DIAGNOSES: 1. Anemia of renal insufficiency. 2. Intermittent iron-deficiency anemia. 3. Insulin-dependent diabetes.  CURRENT THERAPY: 1. Aranesp 300 mcg subcu as needed for hemoglobin less than 11. 2. IV iron as indicated.  INTERIM HISTORY:  [name] comes in for a followup.  She has really had a tough time.  She had back surgery back in, I think, March.  She got through this.  She is still recovering from this.  She has had no issues with obvious bleeding or bruising.  She has had no problems with cough or shortness of breath.  When we last saw her back in August, her ferritin was 142 with an iron saturation of 28%.  She does feel a little bit tired.  She is still dealing with the diabetes.  She is on metformin for this.  There have been no problems with headaches.  She has had no leg swelling.  There have been no rashes.  PHYSICAL EXAMINATION:  General:  This is a well-developed, well- nourished white female, in no obvious distress.  Vital Signs: Temperature of 98.6, pulse 98, respiratory rate 14, blood pressure 124/50, weight is 225 pounds.  Head and Neck:  Normocephalic, atraumatic skull.  There are no ocular or oral lesions.  There are no palpable cervical or supraclavicular lymph nodes.  Lungs:  Clear bilaterally. Cardiac:  Regular rate and rhythm with a normal S1 and S2.  There are no murmurs, rubs, or bruits.  Abdomen:  Soft.  There has been no palpable abdominal mass.  There is no fluid wave.  There is no palpable hepatosplenomegaly.  Extremities:  No clubbing, cyanosis, or edema.  She has decent range of motion of her joints.  Skin:  No rashes, ecchymosis, or petechia.  LABORATORY STUDIES:  White cell count is 9.9, hemoglobin 10.5, hematocrit 31.7, platelet count 246.  A ferritin 210 with an iron saturation of 35%.  IMPRESSION:  [name] is a very nice 62 year old white female.  She does not want any Aranesp today.  She is really  not that symptomatic. She feels that she can hold off right now.  She does not need any iron right now.  I think we can get her through the holidays now.  We will plan to get her back to see [place] in another 2-3 months.    ______________________________ Josph Macho, M.D. PRE/MEDQ  D:  03/18/2013  T:  03/28/2013  Job:  [id number]

## 2013-05-13 ENCOUNTER — Telehealth: Payer: Self-pay | Admitting: Hematology & Oncology

## 2013-05-13 NOTE — Telephone Encounter (Signed)
Patient called and cx 05/15/13 apt and resch for 07/01/13

## 2013-05-15 ENCOUNTER — Other Ambulatory Visit: Payer: BC Managed Care – PPO | Admitting: Lab

## 2013-05-15 ENCOUNTER — Ambulatory Visit: Payer: BC Managed Care – PPO | Admitting: Hematology & Oncology

## 2013-06-12 ENCOUNTER — Telehealth: Payer: Self-pay | Admitting: Hematology & Oncology

## 2013-06-12 NOTE — Telephone Encounter (Signed)
Pt moved 2-25 to 3-12

## 2013-07-01 ENCOUNTER — Ambulatory Visit: Payer: BC Managed Care – PPO | Admitting: Hematology & Oncology

## 2013-07-01 ENCOUNTER — Other Ambulatory Visit: Payer: BC Managed Care – PPO | Admitting: Lab

## 2013-07-16 ENCOUNTER — Other Ambulatory Visit (HOSPITAL_BASED_OUTPATIENT_CLINIC_OR_DEPARTMENT_OTHER): Payer: BC Managed Care – PPO | Admitting: Lab

## 2013-07-16 ENCOUNTER — Ambulatory Visit (HOSPITAL_BASED_OUTPATIENT_CLINIC_OR_DEPARTMENT_OTHER): Payer: BC Managed Care – PPO | Admitting: Hematology & Oncology

## 2013-07-16 ENCOUNTER — Encounter: Payer: Self-pay | Admitting: Hematology & Oncology

## 2013-07-16 VITALS — BP 119/36 | HR 70 | Temp 98.0°F | Resp 14 | Ht 63.0 in | Wt 224.0 lb

## 2013-07-16 DIAGNOSIS — N289 Disorder of kidney and ureter, unspecified: Secondary | ICD-10-CM

## 2013-07-16 DIAGNOSIS — D631 Anemia in chronic kidney disease: Secondary | ICD-10-CM

## 2013-07-16 DIAGNOSIS — E119 Type 2 diabetes mellitus without complications: Secondary | ICD-10-CM

## 2013-07-16 DIAGNOSIS — D509 Iron deficiency anemia, unspecified: Secondary | ICD-10-CM

## 2013-07-16 DIAGNOSIS — N189 Chronic kidney disease, unspecified: Principal | ICD-10-CM

## 2013-07-16 DIAGNOSIS — D649 Anemia, unspecified: Secondary | ICD-10-CM

## 2013-07-16 LAB — CBC WITH DIFFERENTIAL (CANCER CENTER ONLY)
BASO#: 0 10*3/uL (ref 0.0–0.2)
BASO%: 0.1 % (ref 0.0–2.0)
EOS ABS: 0.2 10*3/uL (ref 0.0–0.5)
EOS%: 1.8 % (ref 0.0–7.0)
HCT: 31.2 % — ABNORMAL LOW (ref 34.8–46.6)
HGB: 10.3 g/dL — ABNORMAL LOW (ref 11.6–15.9)
LYMPH#: 1.9 10*3/uL (ref 0.9–3.3)
LYMPH%: 20.4 % (ref 14.0–48.0)
MCH: 31.8 pg (ref 26.0–34.0)
MCHC: 33 g/dL (ref 32.0–36.0)
MCV: 96 fL (ref 81–101)
MONO#: 0.8 10*3/uL (ref 0.1–0.9)
MONO%: 8.5 % (ref 0.0–13.0)
NEUT#: 6.5 10*3/uL (ref 1.5–6.5)
NEUT%: 69.2 % (ref 39.6–80.0)
Platelets: 230 10*3/uL (ref 145–400)
RBC: 3.24 10*6/uL — ABNORMAL LOW (ref 3.70–5.32)
RDW: 13.1 % (ref 11.1–15.7)
WBC: 9.4 10*3/uL (ref 3.9–10.0)

## 2013-07-16 LAB — IRON AND TIBC CHCC
%SAT: 24 % (ref 21–57)
IRON: 82 ug/dL (ref 41–142)
TIBC: 340 ug/dL (ref 236–444)
UIBC: 258 ug/dL (ref 120–384)

## 2013-07-16 LAB — RETICULOCYTES (CHCC)
ABS RETIC: 106.2 10*3/uL (ref 19.0–186.0)
RBC.: 3.32 MIL/uL — AB (ref 3.87–5.11)
Retic Ct Pct: 3.2 % — ABNORMAL HIGH (ref 0.4–2.3)

## 2013-07-16 LAB — FERRITIN CHCC: FERRITIN: 119 ng/mL (ref 9–269)

## 2013-07-16 NOTE — Progress Notes (Signed)
  DIAGNOSIS:  Anemia of renal insufficiency  Intermittent iron deficiency anemia Non- insulin-dependent diabetes  CURRENT THERAPY:  Ms. Pernice is back for followup. I saw her 4 months ago. She's been doing okay. His been a year since she had her back surgery. She's feeling better from this. She still has some issues but nothing like she used to have.    Her blood sugars have been on the high side.    We last saw her, her ferritin was 210. Iron saturation 35%.   INTERIM HISTORY: Aranesp 300 mcg as needed hemoglobin less than 11 IV iron as indicated   PHYSICAL EXAMINATION:  Somewhat obese white female. Vital signs are temperature of 98. Pulse 70. Blood pressure 119/36. Weight 224 pounds. Head and neck exam shows no ocular or oral lesions. There is no adenopathy in the neck. Thyroid nonpalpable. Lungs are clear. Cardiac exam regular in rhythm. Abdomen soft, obese. No fluid wave. No palpable liver or spleen tip. Back exam well-healed lumbar laminectomy scar. Extremities no clubbing cyanosis or edema. Skin exam no rashes.   LABORATORY STUDIES:  White cell count 9.4. Hemoglobin 10.3. Hematocrit 31.2. Platelet count 230.Marland Kitchen The blood smear is unremarkable.   IMPRESSION:  Ms. Krull is a 63 year old female. She has multi-factorial anemia. She's doing well right now. She's not had Aranesp now for a year. She's doing okay. I think we can probably hold off on doing a shot right now even though her hemoglobin is less than 1011.  Her last IV iron was given back in July of 2013.    I will see her back in 4 more months.   Volanda Napoleon, MD 07/16/2013

## 2013-07-22 ENCOUNTER — Telehealth: Payer: Self-pay | Admitting: *Deleted

## 2013-07-22 NOTE — Telephone Encounter (Addendum)
Message copied by Orlando Penner on Wed Jul 22, 2013  3:06 PM ------      Message from: Burney Gauze R      Created: Fri Jul 17, 2013  6:16 AM       Call - iron is dropping - Need IV feraheme 1020mg  x 1 dose.  Please set up for 2-3 weeks. Pete ------Pt called and given this information.  She will call us back and get this set up.

## 2013-07-23 ENCOUNTER — Telehealth: Payer: Self-pay | Admitting: Hematology & Oncology

## 2013-07-23 ENCOUNTER — Other Ambulatory Visit: Payer: Self-pay | Admitting: *Deleted

## 2013-07-23 DIAGNOSIS — D509 Iron deficiency anemia, unspecified: Secondary | ICD-10-CM

## 2013-07-23 MED ORDER — SODIUM CHLORIDE 0.9 % IV SOLN: Freq: Once | INTRAVENOUS | Status: DC

## 2013-07-23 MED ORDER — SODIUM CHLORIDE 0.9 % IV SOLN: 1020.0000 mg | Freq: Once | INTRAVENOUS | Status: DC

## 2013-07-23 NOTE — Telephone Encounter (Signed)
Pt called made 3-26 iron appointment

## 2013-07-30 ENCOUNTER — Ambulatory Visit (HOSPITAL_BASED_OUTPATIENT_CLINIC_OR_DEPARTMENT_OTHER): Payer: BC Managed Care – PPO

## 2013-07-30 VITALS — BP 134/69 | HR 84 | Temp 97.1°F | Resp 16

## 2013-07-30 DIAGNOSIS — N189 Chronic kidney disease, unspecified: Secondary | ICD-10-CM

## 2013-07-30 DIAGNOSIS — N039 Chronic nephritic syndrome with unspecified morphologic changes: Secondary | ICD-10-CM

## 2013-07-30 DIAGNOSIS — D509 Iron deficiency anemia, unspecified: Secondary | ICD-10-CM

## 2013-07-30 DIAGNOSIS — D631 Anemia in chronic kidney disease: Secondary | ICD-10-CM

## 2013-07-30 MED ORDER — SODIUM CHLORIDE 0.9 % IV SOLN
Freq: Once | INTRAVENOUS | Status: AC
  Administered 2013-07-30: 12:00:00 via INTRAVENOUS

## 2013-07-30 MED ORDER — SODIUM CHLORIDE 0.9 % IV SOLN
1020.0000 mg | Freq: Once | INTRAVENOUS | Status: AC
  Administered 2013-07-30: 1020 mg via INTRAVENOUS
  Filled 2013-07-30: qty 34

## 2013-07-30 NOTE — Patient Instructions (Signed)

## 2013-07-31 ENCOUNTER — Encounter: Payer: Self-pay | Admitting: Internal Medicine

## 2013-08-21 ENCOUNTER — Other Ambulatory Visit: Payer: Self-pay | Admitting: Family Medicine

## 2013-08-21 DIAGNOSIS — R109 Unspecified abdominal pain: Secondary | ICD-10-CM

## 2013-08-24 ENCOUNTER — Other Ambulatory Visit: Payer: BC Managed Care – PPO

## 2013-08-25 ENCOUNTER — Other Ambulatory Visit: Payer: BC Managed Care – PPO

## 2013-09-16 ENCOUNTER — Ambulatory Visit (AMBULATORY_SURGERY_CENTER): Payer: Self-pay | Admitting: *Deleted

## 2013-09-16 VITALS — Ht 63.5 in | Wt 224.2 lb

## 2013-09-16 DIAGNOSIS — Z860100 Personal history of colon polyps, unspecified: Secondary | ICD-10-CM

## 2013-09-16 MED ORDER — MOVIPREP 100 G PO SOLR: ORAL | Status: DC

## 2013-09-16 NOTE — Progress Notes (Signed)
No allergies to eggs or soy.   Was not asleep with last colonoscopy.  Had Versed and Fentanyl.  Pt given Emmi instructions for colonoscopy  No oxygen use  No diet drug use

## 2013-09-21 ENCOUNTER — Encounter: Payer: Self-pay | Admitting: Internal Medicine

## 2013-09-22 ENCOUNTER — Telehealth: Payer: Self-pay | Admitting: Hematology & Oncology

## 2013-09-22 NOTE — Telephone Encounter (Signed)
Faxed Medical Records via fax today  to:  [facility] Ph: [phone] Fx: [phone]    Medical  Records requested from ALL to present   [place] SCANNED

## 2013-09-30 ENCOUNTER — Ambulatory Visit (AMBULATORY_SURGERY_CENTER): Payer: BC Managed Care – PPO | Admitting: Internal Medicine

## 2013-09-30 ENCOUNTER — Encounter: Payer: Self-pay | Admitting: Internal Medicine

## 2013-09-30 VITALS — BP 117/60 | HR 84 | Temp 97.6°F | Resp 21 | Ht 63.5 in | Wt 224.0 lb

## 2013-09-30 DIAGNOSIS — Z860100 Personal history of colon polyps, unspecified: Secondary | ICD-10-CM

## 2013-09-30 DIAGNOSIS — D126 Benign neoplasm of colon, unspecified: Secondary | ICD-10-CM

## 2013-09-30 LAB — GLUCOSE, CAPILLARY
Glucose-Capillary: 126 mg/dL — ABNORMAL HIGH (ref 70–99)
Glucose-Capillary: 100 mg/dL — ABNORMAL HIGH (ref 70–99)

## 2013-09-30 MED ORDER — SODIUM CHLORIDE 0.9 % IV SOLN: 500.0000 mL | INTRAVENOUS | Status: DC

## 2013-09-30 NOTE — Progress Notes (Signed)
Called to room to assist during endoscopic procedure.  Patient ID and intended procedure confirmed with present staff. Received instructions for my participation in the procedure from the performing physician.  

## 2013-09-30 NOTE — Patient Instructions (Signed)
YOU HAD AN ENDOSCOPIC PROCEDURE TODAY AT THE [facility]: Refer to the procedure report that was given to you for any specific questions about what was found during the examination.  If the procedure report does not answer your questions, please call your gastroenterologist to clarify.  If you requested that your care partner not be given the details of your procedure findings, then the procedure report has been included in a sealed envelope for you to review at your convenience later.  YOU SHOULD EXPECT: Some feelings of bloating in the abdomen. Passage of more gas than usual.  Walking can help get rid of the air that was put into your GI tract during the procedure and reduce the bloating. If you had a lower endoscopy (such as a colonoscopy or flexible sigmoidoscopy) you may notice spotting of blood in your stool or on the toilet paper. If you underwent a bowel prep for your procedure, then you may not have a normal bowel movement for a few days.  DIET: Your first meal following the procedure should be a light meal and then it is ok to progress to your normal diet.  A half-sandwich or bowl of soup is an example of a good first meal.  Heavy or fried foods are harder to digest and may make you feel nauseous or bloated.  Likewise meals heavy in dairy and vegetables can cause extra gas to form and this can also increase the bloating.  Drink plenty of fluids but you should avoid alcoholic beverages for 24 hours.  ACTIVITY: Your care partner should take you home directly after the procedure.  You should plan to take it easy, moving slowly for the rest of the day.  You can resume normal activity the day after the procedure however you should NOT DRIVE or use heavy machinery for 24 hours (because of the sedation medicines used during the test).    SYMPTOMS TO REPORT IMMEDIATELY: A gastroenterologist can be reached at any hour.  During normal business hours, 8:30 AM to 5:00 PM Monday through Friday,  call [phone].  After hours and on weekends, please call the GI answering service at [phone] who will take a message and have the physician on call contact you.   Following lower endoscopy (colonoscopy or flexible sigmoidoscopy):  Excessive amounts of blood in the stool  Significant tenderness or worsening of abdominal pains  Swelling of the abdomen that is new, acute  Fever of 100F or higher   FOLLOW UP: If any biopsies were taken you will be contacted by phone or by letter within the next 1-3 weeks.  Call your gastroenterologist if you have not heard about the biopsies in 3 weeks.  Our staff will call the home number listed on your records the next business day following your procedure to check on you and address any questions or concerns that you may have at that time regarding the information given to you following your procedure. This is a courtesy call and so if there is no answer at the home number and we have not heard from you through the emergency physician on call, we will assume that you have returned to your regular daily activities without incident.  SIGNATURES/CONFIDENTIALITY: You and/or your care partner have signed paperwork which will be entered into your electronic medical record.  These signatures attest to the fact that that the information above on your After Visit Summary has been reviewed and is understood.  Full responsibility of the confidentiality of   this discharge information lies with you and/or your care-partner.    Handouts were given to your care partner on polyps and a high fiber diet with liberal fluid intake. You may resume your current medications today. Await biopsy results. Please call if any questions or concerns. Blood sugar was 100 in the recovery room.

## 2013-09-30 NOTE — Op Note (Signed)
[place]  [employer]. [place], [id number]   COLONOSCOPY PROCEDURE REPORT  PATIENT: [name], [name]  MR#: [id number] BIRTHDATE: [birth year] , 62  yrs. old GENDER: Female ENDOSCOPIST: Lafayette Dragon, MD REFERRED VZ:CHYIFOYDX [name], M.D. PROCEDURE DATE:  09/30/2013 PROCEDURE:   Colonoscopy with snare polypectomy First Screening Colonoscopy - Avg.  risk and is 50 yrs.  old or older - No.  Prior Negative Screening - Now for repeat screening. N/A  History of Adenoma - Now for follow-up colonoscopy & has been > or = to 3 yrs.  Yes hx of adenoma.  Has been 3 or more years since last colonoscopy.  Polyps Removed Today? Yes. ASA CLASS:   Class III INDICATIONS:[facility]scar colonoscopy January 2011, 2 adenomas removed. MEDICATIONS: MAC sedation, administered by CRNA and propofol (Diprivan) 300mg  IV  DESCRIPTION OF PROCEDURE:   After the risks benefits and alternatives of the procedure were thoroughly explained, informed consent was obtained.  A digital rectal exam revealed no abnormalities of the rectum.   The LB [id number]  endoscope was introduced through the anus and advanced to the cecum, which was identified by both the appendix and ileocecal valve. No adverse events experienced.   The quality of the prep was good, using MoviPrep  The instrument was then slowly withdrawn as the colon was fully examined.      COLON FINDINGS: A semi-pedunculated polyp ranging between 3-37mm in size was found in the sigmoid colon.  A polypectomy was performed with a cold snare.  The resection was complete and the polyp tissue was completely retrieved.  Retroflexed views revealed no abnormalities. The time to cecum=5 minutes 48 seconds.  Withdrawal time=9 minutes 07 seconds.  The scope was withdrawn and the procedure completed. COMPLICATIONS: There were no complications.  ENDOSCOPIC IMPRESSION: Semi-pedunculated polyp ranging between 3-12mm in size was found in the sigmoid colon;  polypectomy was performed with a cold snare  RECOMMENDATIONS: 1.  Await pathology results 2.  high fiber diet Recall colonoscopy pending path report   eSigned:  Lafayette Dragon, MD 09/30/2013 12:36 PM   cc:   PATIENT NAME:  Latoria, Dry MR#: [id number]

## 2013-09-30 NOTE — Progress Notes (Signed)
A/ox3, pleased with MAC, report to RN at 1236

## 2013-09-30 NOTE — Progress Notes (Signed)
No problems noted in the recovery room. maw 

## 2013-10-01 ENCOUNTER — Telehealth: Payer: Self-pay | Admitting: *Deleted

## 2013-10-01 NOTE — Telephone Encounter (Signed)
  Follow up Call-  Call back number 09/30/2013  Post procedure Call Back phone  # [phone]  Permission to leave phone message Yes     Patient questions:  Do you have a fever, pain , or abdominal swelling? no Pain Score  0 *  Have you tolerated food without any problems? yes  Have you been able to return to your normal activities? yes  Do you have any questions about your discharge instructions: Diet   no Medications  no Follow up visit  no  Do you have questions or concerns about your Care? no  Actions: * If pain score is 4 or above: No action needed, pain <4.

## 2013-10-05 ENCOUNTER — Encounter: Payer: Self-pay | Admitting: Internal Medicine

## 2013-10-06 ENCOUNTER — Encounter: Payer: Self-pay | Admitting: *Deleted

## 2013-11-19 ENCOUNTER — Ambulatory Visit (HOSPITAL_BASED_OUTPATIENT_CLINIC_OR_DEPARTMENT_OTHER): Payer: BC Managed Care – PPO

## 2013-11-19 ENCOUNTER — Ambulatory Visit (HOSPITAL_BASED_OUTPATIENT_CLINIC_OR_DEPARTMENT_OTHER): Payer: BC Managed Care – PPO | Admitting: Hematology & Oncology

## 2013-11-19 ENCOUNTER — Other Ambulatory Visit (HOSPITAL_BASED_OUTPATIENT_CLINIC_OR_DEPARTMENT_OTHER): Payer: BC Managed Care – PPO | Admitting: Lab

## 2013-11-19 DIAGNOSIS — D509 Iron deficiency anemia, unspecified: Secondary | ICD-10-CM

## 2013-11-19 DIAGNOSIS — D649 Anemia, unspecified: Secondary | ICD-10-CM

## 2013-11-19 DIAGNOSIS — N189 Chronic kidney disease, unspecified: Principal | ICD-10-CM

## 2013-11-19 DIAGNOSIS — D631 Anemia in chronic kidney disease: Secondary | ICD-10-CM

## 2013-11-19 DIAGNOSIS — N289 Disorder of kidney and ureter, unspecified: Secondary | ICD-10-CM

## 2013-11-19 DIAGNOSIS — L408 Other psoriasis: Secondary | ICD-10-CM

## 2013-11-19 LAB — CBC WITH DIFFERENTIAL (CANCER CENTER ONLY)
BASO#: 0 10*3/uL (ref 0.0–0.2)
BASO%: 0.3 % (ref 0.0–2.0)
EOS ABS: 0.1 10*3/uL (ref 0.0–0.5)
EOS%: 1 % (ref 0.0–7.0)
HEMATOCRIT: 29.5 % — AB (ref 34.8–46.6)
HEMOGLOBIN: 10 g/dL — AB (ref 11.6–15.9)
LYMPH#: 1.5 10*3/uL (ref 0.9–3.3)
LYMPH%: 20.6 % (ref 14.0–48.0)
MCH: 33.8 pg (ref 26.0–34.0)
MCHC: 33.9 g/dL (ref 32.0–36.0)
MCV: 100 fL (ref 81–101)
MONO#: 0.6 10*3/uL (ref 0.1–0.9)
MONO%: 8.3 % (ref 0.0–13.0)
NEUT%: 69.8 % (ref 39.6–80.0)
NEUTROS ABS: 5.1 10*3/uL (ref 1.5–6.5)
Platelets: 224 10*3/uL (ref 145–400)
RBC: 2.96 10*6/uL — AB (ref 3.70–5.32)
RDW: 12.5 % (ref 11.1–15.7)
WBC: 7.3 10*3/uL (ref 3.9–10.0)

## 2013-11-19 LAB — CHCC SATELLITE - SMEAR

## 2013-11-19 MED ORDER — DARBEPOETIN ALFA-POLYSORBATE 300 MCG/0.6ML IJ SOLN
300.0000 ug | Freq: Once | INTRAMUSCULAR | Status: AC
  Administered 2013-11-19: 300 ug via SUBCUTANEOUS

## 2013-11-19 MED ORDER — DARBEPOETIN ALFA-POLYSORBATE 300 MCG/0.6ML IJ SOLN
INTRAMUSCULAR | Status: AC
  Filled 2013-11-19: qty 0.6

## 2013-11-19 MED ORDER — SODIUM CHLORIDE 0.9 % IV SOLN
1020.0000 mg | Freq: Once | INTRAVENOUS | Status: AC
  Administered 2013-11-19: 1020 mg via INTRAVENOUS
  Filled 2013-11-19: qty 34

## 2013-11-19 NOTE — Patient Instructions (Signed)
Darbepoetin Alfa injection What is this medicine? DARBEPOETIN ALFA (dar be POE e tin AL fa) helps your body make more red blood cells. It is used to treat anemia caused by chronic kidney failure and chemotherapy. This medicine may be used for other purposes; ask your health care provider or pharmacist if you have questions. COMMON BRAND NAME(S): Aranesp What should I tell my health care provider before I take this medicine? They need to know if you have any of these conditions: -blood clotting disorders or history of blood clots -cancer patient not on chemotherapy -cystic fibrosis -heart disease, such as angina, heart failure, or a history of a heart attack -hemoglobin level of 12 g/dL or greater -high blood pressure -low levels of folate, iron, or vitamin B12 -seizures -an unusual or allergic reaction to darbepoetin, erythropoietin, albumin, hamster proteins, latex, other medicines, foods, dyes, or preservatives -pregnant or trying to get pregnant -breast-feeding How should I use this medicine? This medicine is for injection into a vein or under the skin. It is usually given by a health care professional in a hospital or clinic setting. If you get this medicine at home, you will be taught how to prepare and give this medicine. Do not shake the solution before you withdraw a dose. Use exactly as directed. Take your medicine at regular intervals. Do not take your medicine more often than directed. It is important that you put your used needles and syringes in a special sharps container. Do not put them in a trash can. If you do not have a sharps container, call your pharmacist or healthcare provider to get one. Talk to your pediatrician regarding the use of this medicine in children. While this medicine may be used in children as young as 1 year for selected conditions, precautions do apply. Overdosage: If you think you have taken too much of this medicine contact a poison control center or  emergency room at once. NOTE: This medicine is only for you. Do not share this medicine with others. What if I miss a dose? If you miss a dose, take it as soon as you can. If it is almost time for your next dose, take only that dose. Do not take double or extra doses. What may interact with this medicine? Do not take this medicine with any of the following medications: -epoetin alfa This list may not describe all possible interactions. Give your health care provider a list of all the medicines, herbs, non-prescription drugs, or dietary supplements you use. Also tell them if you smoke, drink alcohol, or use illegal drugs. Some items may interact with your medicine. What should I watch for while using this medicine? Visit your prescriber or health care professional for regular checks on your progress and for the needed blood tests and blood pressure measurements. It is especially important for the doctor to make sure your hemoglobin level is in the desired range, to limit the risk of potential side effects and to give you the best benefit. Keep all appointments for any recommended tests. Check your blood pressure as directed. Ask your doctor what your blood pressure should be and when you should contact him or her. As your body makes more red blood cells, you may need to take iron, folic acid, or vitamin B supplements. Ask your doctor or health care provider which products are right for you. If you have kidney disease continue dietary restrictions, even though this medication can make you feel better. Talk with your doctor or health   care professional about the foods you eat and the vitamins that you take. What side effects may I notice from receiving this medicine? Side effects that you should report to your doctor or health care professional as soon as possible: -allergic reactions like skin rash, itching or hives, swelling of the face, lips, or tongue -breathing problems -changes in vision -chest  pain -confusion, trouble speaking or understanding -feeling faint or lightheaded, falls -high blood pressure -muscle aches or pains -pain, swelling, warmth in the leg -rapid weight gain -severe headaches -sudden numbness or weakness of the face, arm or leg -trouble walking, dizziness, loss of balance or coordination -seizures (convulsions) -swelling of the ankles, feet, hands -unusually weak or tired Side effects that usually do not require medical attention (report to your doctor or health care professional if they continue or are bothersome): -diarrhea -fever, chills (flu-like symptoms) -headaches -nausea, vomiting -redness, stinging, or swelling at site where injected This list may not describe all possible side effects. Call your doctor for medical advice about side effects. You may report side effects to FDA at 1-800-FDA-1088. Where should I keep my medicine? Keep out of the reach of children. Store in a refrigerator between 2 and 8 degrees C (36 and 46 degrees F). Do not freeze. Do not shake. Throw away any unused portion if using a single-dose vial. Throw away any unused medicine after the expiration date. NOTE: This sheet is a summary. It may not cover all possible information. If you have questions about this medicine, talk to your doctor, pharmacist, or health care provider.  2015, Elsevier/Gold Standard. (2008-04-06 10:23:57)  Ferumoxytol injection What is this medicine? FERUMOXYTOL is an iron complex. Iron is used to make healthy red blood cells, which carry oxygen and nutrients throughout the body. This medicine is used to treat iron deficiency anemia in people with chronic kidney disease. This medicine may be used for other purposes; ask your health care provider or pharmacist if you have questions. COMMON BRAND NAME(S): Feraheme What should I tell my health care provider before I take this medicine? They need to know if you have any of these conditions: -anemia not  caused by low iron levels -high levels of iron in the blood -magnetic resonance imaging (MRI) test scheduled -an unusual or allergic reaction to iron, other medicines, foods, dyes, or preservatives -pregnant or trying to get pregnant -breast-feeding How should I use this medicine? This medicine is for injection into a vein. It is given by a health care professional in a hospital or clinic setting. Talk to your pediatrician regarding the use of this medicine in children. Special care may be needed. Overdosage: If you think you've taken too much of this medicine contact a poison control center or emergency room at once. Overdosage: If you think you have taken too much of this medicine contact a poison control center or emergency room at once. NOTE: This medicine is only for you. Do not share this medicine with others. What if I miss a dose? It is important not to miss your dose. Call your doctor or health care professional if you are unable to keep an appointment. What may interact with this medicine? This medicine may interact with the following medications: -other iron products This list may not describe all possible interactions. Give your health care provider a list of all the medicines, herbs, non-prescription drugs, or dietary supplements you use. Also tell them if you smoke, drink alcohol, or use illegal drugs. Some items may interact with your   medicine. What should I watch for while using this medicine? Visit your doctor or healthcare professional regularly. Tell your doctor or healthcare professional if your symptoms do not start to get better or if they get worse. You may need blood work done while you are taking this medicine. You may need to follow a special diet. Talk to your doctor. Foods that contain iron include: whole grains/cereals, dried fruits, beans, or peas, leafy green vegetables, and organ meats (liver, kidney). What side effects may I notice from receiving this  medicine? Side effects that you should report to your doctor or health care professional as soon as possible: -allergic reactions like skin rash, itching or hives, swelling of the face, lips, or tongue -breathing problems -changes in blood pressure -feeling faint or lightheaded, falls -fever or chills -flushing, sweating, or hot feelings -swelling of the ankles or feet Side effects that usually do not require medical attention (Report these to your doctor or health care professional if they continue or are bothersome.): -diarrhea -headache -nausea, vomiting -stomach pain This list may not describe all possible side effects. Call your doctor for medical advice about side effects. You may report side effects to FDA at 1-800-FDA-1088. Where should I keep my medicine? This drug is given in a hospital or clinic and will not be stored at home. NOTE: This sheet is a summary. It may not cover all possible information. If you have questions about this medicine, talk to your doctor, pharmacist, or health care provider.  2015, Elsevier/Gold Standard. (2011-12-07 15:23:36)  

## 2013-11-19 NOTE — Progress Notes (Signed)
Hematology and Oncology Follow Up Visit  [name] [id number] 07-08-1950 63 y.o. 11/19/2013   Principle Diagnosis:   Anemia of renal insufficiency  Intermittent iron deficiency anemia       Non- insulin-dependent diabetes  Current Therapy:    IV iron as indicated with Feraheme  Aranesp 300 mcg subcutaneous as needed for hemoglobin less than 11     Interim History:  Ms.  Padilla is back for followup. She feels tired. Last saw her about 3 or 4 months ago. She just has no energy. She's had no bleeding. She's had no change in bowel bladder habits. Her blood sugars have been on the high side.  We last saw her, her ferritin was 119 and her saturation 24%.  She's had no cough. She just has little energy. She has had some swelling in her legs. She's had no fever. She's had no rashes. There's been no bruising. There's been no nausea or vomiting.  Medications: Current outpatient prescriptions:albuterol (PROVENTIL HFA;VENTOLIN HFA) 108 (90 BASE) MCG/ACT inhaler, Inhale 1 puff into the lungs as needed for shortness of breath. , Disp: , Rfl: ;  aspirin 81 MG tablet, Take 81 mg by mouth daily., Disp: , Rfl: ;  clobetasol (OLUX) 0.05 % topical foam, Apply 1 application topically daily as needed (apply to head for dry scalp). , Disp: , Rfl:  cyclobenzaprine (FLEXERIL) 10 MG tablet, Take 10 mg by mouth as needed for muscle spasms., Disp: , Rfl: ;  DULoxetine (CYMBALTA) 60 MG capsule, Take 90 mg by mouth daily. , Disp: , Rfl: ;  glimepiride (AMARYL) 4 MG tablet, Take 8 mg by mouth daily after supper. , Disp: , Rfl: ;  lisinopril-hydrochlorothiazide (PRINZIDE,ZESTORETIC) 20-25 MG per tablet, Take 1 tablet by mouth daily., Disp: , Rfl:  LORazepam (ATIVAN) 1 MG tablet, Take 1 mg by mouth 3 (three) times daily. , Disp: , Rfl: ;  metFORMIN (GLUCOPHAGE) 1000 MG tablet, Take 1,000 mg by mouth 2 (two) times daily with a meal., Disp: , Rfl: ;  Multiple Vitamin (MULTI-VITAMIN DAILY PO), Take 1 tablet by mouth  daily. , Disp: , Rfl: ;  pantoprazole (PROTONIX) 40 MG tablet, Take 40 mg by mouth daily as needed (for reflux). For acid reflux, Disp: , Rfl:  simvastatin (ZOCOR) 40 MG tablet, Take 40 mg by mouth at bedtime. , Disp: , Rfl: ;  INVOKANA 100 MG TABS, Take by mouth every morning. , Disp: , Rfl:   Allergies:  Allergies  Allergen Reactions  . Victoza [Liraglutide]     Severe rash and hives.  . Dilaudid [Hydromorphone]     Rash and swelling  . Ultram [Tramadol] Rash  . Betadine [Povidone Iodine] Other (See Comments)    Skin peels  . Bydureon [Exenatide] Diarrhea and Nausea And Vomiting  . Codeine     Severe headaches  . Contrast Media [Iodinated Diagnostic Agents] Nausea And Vomiting  . Hydrocodone Other (See Comments)    headache  . Iodine     Iodine put on for surgery  . Januvia [Sitagliptin] Diarrhea  . Morphine And Related Nausea And Vomiting  . Percocet [Oxycodone-Acetaminophen] Other (See Comments)    Headache   . Septra [Sulfamethoxazole-Trimethoprim]     rash  . Sulfa Antibiotics Itching  . Valium Other (See Comments)    headache  . Vicodin [Hydrocodone-Acetaminophen] Other (See Comments)    headache  . Amoxicillin Hives and Rash  . Darvocet [Propoxyphene N-Acetaminophen] Rash and Other (See Comments)    headache  .  Fentanyl And Related Rash  . Wellbutrin [Bupropion Hcl] Rash    Past Medical History, Surgical history, Social history, and Family History were reviewed and updated.  Review of Systems: As above  Physical Exam:  vitals were not taken for this visit.  Mildly obese white female in no obvious distress. Vital signs were not done. Head and neck exam shows no ocular or oral lesion. There are no palpable cervical or supraclavicular lymph nodes. Lungs are clear bilaterally. Cardiac exam regular in rhythm with no murmurs rubs or bruits. Abdomen soft. Has good bowel sounds. There is no fluid wave. There is no palpable liver or spleen tip. Back exam no tenderness  over the spine ribs or hips. Extremities shows no clubbing or cyanosis. She has 1+ edema in her lower legs. Skin exam no rashes, it was or petechia. Neurological exam is nonfocal.  Lab Results  Component Value Date   WBC 7.3 11/19/2013   HGB 10.0* 11/19/2013   HCT 29.5* 11/19/2013   MCV 100 11/19/2013   PLT 224 11/19/2013     Chemistry      Component Value Date/Time   NA 135 07/19/2012 0601   K 4.6 07/19/2012 0601   CL 98 07/19/2012 0601   CO2 25 07/19/2012 0601   BUN 21 07/19/2012 0601   CREATININE 1.00 07/19/2012 0601      Component Value Date/Time   CALCIUM 9.2 07/19/2012 0601   ALKPHOS 55 03/03/2012 0813   AST 16 03/03/2012 0813   ALT 24 03/03/2012 0813   BILITOT 0.3 03/03/2012 0813         Impression and Plan: [name] is 63 year old white female. She has multifactorial anemia.  I looked at her blood smear. I did see microcytic red cells.  Arthrotomy with meds that she was on methotrexate for psoriasis. She had to stop because her dermatologist felt that this was causing her anemia. Progress with her dermatologist. I told her that I thought that she probably could go back onto the methotrexate as her white cell count and platelet counts were doing okay.  I think that we probably do need to give her IV iron. It has been several months since she has had any. Also, I think we should give her a dose of Aranesp.  I feel, that we can get her hemoglobin back up to about 12.  Want to see her back in 6 weeks.  Think that, with her going onto methotrexate, we probably need to see her more often so that we can intervene in case she gets anemic and symptomatic.   Volanda Napoleon, MD 7/16/20156:32 PM

## 2013-11-20 LAB — IRON AND TIBC CHCC
%SAT: 30 % (ref 21–57)
Iron: 81 ug/dL (ref 41–142)
TIBC: 275 ug/dL (ref 236–444)
UIBC: 194 ug/dL (ref 120–384)

## 2013-11-20 LAB — FERRITIN CHCC: FERRITIN: 232 ng/mL (ref 9–269)

## 2013-11-23 LAB — RETICULOCYTES (CHCC)
ABS Retic: 124.2 10*3/uL (ref 19.0–186.0)
RBC.: 3.03 MIL/uL — AB (ref 3.87–5.11)
RETIC CT PCT: 4.1 % — AB (ref 0.4–2.3)

## 2013-11-23 LAB — ERYTHROPOIETIN: ERYTHROPOIETIN: 25.5 m[IU]/mL — AB (ref 2.6–18.5)

## 2013-12-21 ENCOUNTER — Telehealth: Payer: Self-pay | Admitting: Hematology & Oncology

## 2013-12-21 NOTE — Telephone Encounter (Signed)
Pt moved 8-27 to 9-3

## 2013-12-31 ENCOUNTER — Other Ambulatory Visit: Payer: BC Managed Care – PPO | Admitting: Lab

## 2013-12-31 ENCOUNTER — Ambulatory Visit: Payer: BC Managed Care – PPO | Admitting: Family

## 2013-12-31 ENCOUNTER — Ambulatory Visit: Payer: BC Managed Care – PPO

## 2014-01-07 ENCOUNTER — Encounter: Payer: Self-pay | Admitting: Family

## 2014-01-07 ENCOUNTER — Ambulatory Visit: Payer: BC Managed Care – PPO

## 2014-01-07 ENCOUNTER — Other Ambulatory Visit (HOSPITAL_BASED_OUTPATIENT_CLINIC_OR_DEPARTMENT_OTHER): Payer: BC Managed Care – PPO | Admitting: Lab

## 2014-01-07 ENCOUNTER — Ambulatory Visit (HOSPITAL_BASED_OUTPATIENT_CLINIC_OR_DEPARTMENT_OTHER): Payer: BC Managed Care – PPO | Admitting: Family

## 2014-01-07 VITALS — BP 147/56 | HR 73 | Temp 98.4°F | Resp 16 | Ht 64.0 in | Wt 225.0 lb

## 2014-01-07 DIAGNOSIS — L408 Other psoriasis: Secondary | ICD-10-CM

## 2014-01-07 DIAGNOSIS — D649 Anemia, unspecified: Secondary | ICD-10-CM

## 2014-01-07 DIAGNOSIS — D509 Iron deficiency anemia, unspecified: Secondary | ICD-10-CM

## 2014-01-07 DIAGNOSIS — D631 Anemia in chronic kidney disease: Secondary | ICD-10-CM

## 2014-01-07 DIAGNOSIS — N189 Chronic kidney disease, unspecified: Principal | ICD-10-CM

## 2014-01-07 LAB — CMP (CANCER CENTER ONLY)
ALBUMIN: 3.5 g/dL (ref 3.3–5.5)
ALT: 28 U/L (ref 10–47)
AST: 20 U/L (ref 11–38)
Alkaline Phosphatase: 47 U/L (ref 26–84)
BUN, Bld: 25 mg/dL — ABNORMAL HIGH (ref 7–22)
CALCIUM: 8.8 mg/dL (ref 8.0–10.3)
CHLORIDE: 98 meq/L (ref 98–108)
CO2: 27 mEq/L (ref 18–33)
Creat: 1.1 mg/dl (ref 0.6–1.2)
Glucose, Bld: 188 mg/dL — ABNORMAL HIGH (ref 73–118)
POTASSIUM: 4.2 meq/L (ref 3.3–4.7)
SODIUM: 142 meq/L (ref 128–145)
TOTAL PROTEIN: 6.4 g/dL (ref 6.4–8.1)
Total Bilirubin: 0.5 mg/dl (ref 0.20–1.60)

## 2014-01-07 LAB — CBC WITH DIFFERENTIAL (CANCER CENTER ONLY)
BASO#: 0 10*3/uL (ref 0.0–0.2)
BASO%: 0.2 % (ref 0.0–2.0)
EOS ABS: 0.2 10*3/uL (ref 0.0–0.5)
EOS%: 2 % (ref 0.0–7.0)
HEMATOCRIT: 32.8 % — AB (ref 34.8–46.6)
HEMOGLOBIN: 11.3 g/dL — AB (ref 11.6–15.9)
LYMPH#: 1.8 10*3/uL (ref 0.9–3.3)
LYMPH%: 19.7 % (ref 14.0–48.0)
MCH: 34.2 pg — AB (ref 26.0–34.0)
MCHC: 34.5 g/dL (ref 32.0–36.0)
MCV: 99 fL (ref 81–101)
MONO#: 0.6 10*3/uL (ref 0.1–0.9)
MONO%: 7 % (ref 0.0–13.0)
NEUT#: 6.4 10*3/uL (ref 1.5–6.5)
NEUT%: 71.1 % (ref 39.6–80.0)
Platelets: 191 10*3/uL (ref 145–400)
RBC: 3.3 10*6/uL — AB (ref 3.70–5.32)
RDW: 12.4 % (ref 11.1–15.7)
WBC: 9 10*3/uL (ref 3.9–10.0)

## 2014-01-07 LAB — CHCC SATELLITE - SMEAR

## 2014-01-07 LAB — RETICULOCYTES (CHCC)
ABS RETIC: 87.4 10*3/uL (ref 19.0–186.0)
RBC.: 3.36 MIL/uL — ABNORMAL LOW (ref 3.87–5.11)
RETIC CT PCT: 2.6 % — AB (ref 0.4–2.3)

## 2014-01-07 NOTE — Progress Notes (Signed)
[place]  [phone] [phone]  ID: HENRY DEMERITT OB: July 15, 1950 MR#: [id number] SHF#:[id number] Patient Care Team: Mayra Neer, MD as PCP - General (Family Medicine)  DIAGNOSIS: Anemia of renal insufficiency  Intermittent iron deficiency anemia Non- insulin-dependent diabetes  INTERVAL HISTORY: Ms. [name] is 63 year old white female with multifactorial anemia. She was on methotrexate for psoriasis. She had to stop because her dermatologist felt that this was causing her anemia. She has restarted this medication . We will continue to monitor her with this. She had iron in July and has done well with it. Her energy is much improved. Today her Hgb is 11.3. She denies fever, chills, n/v, cough, rash, headache, dizziness, SOB, chest pain, palpitations, abdominal pain, constipation, diarrhea, blood in urine or stool. She has had no swelling, tenderness, numbness or tingling in her extremities. Her appetite is good and she is drinking plenty of fluids. Over all, she is doing quite well.  CURRENT TREATMENT: IV iron as indicated with Feraheme  Aranesp 300 mcg subcutaneous as needed for hemoglobin less than 11  REVIEW OF SYSTEMS: All other 10 point review of systems is negative.   PAST MEDICAL HISTORY: Past Medical History  Diagnosis Date  . Anemia in chronic renal disease 06/05/2011  . Psoriasis 06/05/2011    on legs and uses cream prn  . DDD (degenerative disc disease), cervical 06/05/2011  . DDD (degenerative disc disease), lumbosacral 06/05/2011  . PONV (postoperative nausea and vomiting)     hard to wake up  . HTN (hypertension) 06/05/2011    takes Prinizide daily  . Hyperlipidemia     takes Zocor daily  . Asthma     has an inhaler prn  . Cough   . Seasonal allergies     but doesn't take any meds  . History of bronchitis     last time 71yrs ago  . Headache(784.0)     occasionally  . Dizziness   . Diabetic neuropathy   . Sleep apnea      uses CPAP  . GERD (gastroesophageal reflux disease)     takes Protonix prn  . Diarrhea   . History of colon polyps   . Diabetes mellitus     takes Amaryl and Metformin daily  . Depression     takes Cymbalta daily  . Anxiety     takes Ativan tid  . Insomnia     not on any meds at present time  . History of MRSA infection    PAST SURGICAL HISTORY: Past Surgical History  Procedure Laterality Date  . Elbow surgery      left d/t tendonitis  . Amputation  02/12/2012    Procedure: AMPUTATION RAY;  Surgeon: Wylene Simmer, MD;  Location: [facility];  Service: Orthopedics;  Laterality: Right;  RIGHT 2ND TOE AMPUTATION   . Cholecystectomy  1977  . Abdominal hysterectomy  1982 and 1984  . Right knee arthroscopy      x 2  . Back surgery      x 3  . Shoulder arthroscopy    . Cervical spine surgery    . Dilation and curettage of uterus    . Colonoscopy    . Esophagogastroduodenoscopy    . Cardiac catheterization      08/09/04: 50% mid AV groove CX lesion, NL LM, LAD, Ramus, EF > 60% (Dr. Gwenlyn Found)  . Lumbar wound debridement N/A 07/15/2012    Procedure: I & D of Lumbar Wound: Possible Repair of CSF Leak;  Surgeon: Elaina Hoops, MD;  Location: [facility];  Service: Neurosurgery;  Laterality: N/A;  I & D of Lumbar Wound: Possible Repair of CSF Leak placement lumbar drain  . Carpal tunnel release Bilateral 2010   FAMILY HISTORY Family History  Problem Relation Age of Onset  . Colon cancer Brother 63   GYNECOLOGIC HISTORY:  No LMP recorded. Patient has had a hysterectomy.   SOCIAL HISTORY:  History   Social History  . Marital Status: Married    Spouse Name: N/A    Number of Children: N/A  . Years of Education: N/A   Occupational History  . Not on file.   Social History Main Topics  . Smoking status: Never Smoker   . Smokeless tobacco: Never Used     Comment: never used tobacco  . Alcohol Use: No  . Drug Use: No  . Sexual Activity: Yes    Birth Control/ Protection: Surgical    Other Topics Concern  . Not on file   Social History Narrative  . No narrative on file   ADVANCED DIRECTIVES: <no information>  HEALTH MAINTENANCE: History  Substance Use Topics  . Smoking status: Never Smoker   . Smokeless tobacco: Never Used     Comment: never used tobacco  . Alcohol Use: No   Colonoscopy: PAP: Bone density: Lipid panel:  Allergies  Allergen Reactions  . Victoza [Liraglutide]     Severe rash and hives.  . Dilaudid [Hydromorphone]     Rash and swelling  . Ultram [Tramadol] Rash  . Betadine [Povidone Iodine] Other (See Comments)    Skin peels  . Bydureon [Exenatide] Diarrhea and Nausea And Vomiting  . Codeine     Severe headaches  . Contrast Media [Iodinated Diagnostic Agents] Nausea And Vomiting  . Hydrocodone Other (See Comments)    headache  . Iodine     Iodine put on for surgery  . Januvia [Sitagliptin] Diarrhea  . Morphine And Related Nausea And Vomiting  . Percocet [Oxycodone-Acetaminophen] Other (See Comments)    Headache   . Septra [Sulfamethoxazole-Trimethoprim]     rash  . Sulfa Antibiotics Itching  . Valium Other (See Comments)    headache  . Vicodin [Hydrocodone-Acetaminophen] Other (See Comments)    headache  . Amoxicillin Hives and Rash  . Darvocet [Propoxyphene N-Acetaminophen] Rash and Other (See Comments)    headache  . Fentanyl And Related Rash  . Wellbutrin [Bupropion Hcl] Rash   Current Outpatient Prescriptions  Medication Sig Dispense Refill  . albuterol (PROVENTIL HFA;VENTOLIN HFA) 108 (90 BASE) MCG/ACT inhaler Inhale 1 puff into the lungs as needed for shortness of breath.       Marland Kitchen aspirin 81 MG tablet Take 81 mg by mouth daily.      . clobetasol (OLUX) 0.05 % topical foam Apply 1 application topically daily as needed (apply to head for dry scalp).       . cyclobenzaprine (FLEXERIL) 10 MG tablet Take 10 mg by mouth as needed for muscle spasms.      . DULoxetine (CYMBALTA) 60 MG capsule Take 90 mg by mouth daily.        Marland Kitchen glimepiride (AMARYL) 4 MG tablet Take 8 mg by mouth daily after supper.       Marland Kitchen lisinopril-hydrochlorothiazide (PRINZIDE,ZESTORETIC) 20-25 MG per tablet Take 1 tablet by mouth daily.      Marland Kitchen LORazepam (ATIVAN) 1 MG tablet Take 1 mg by mouth 3 (three) times daily.       Marland Kitchen  metFORMIN (GLUCOPHAGE) 1000 MG tablet Take 1,000 mg by mouth 2 (two) times daily with a meal.      . Multiple Vitamin (MULTI-VITAMIN DAILY PO) Take 1 tablet by mouth daily.       . pantoprazole (PROTONIX) 40 MG tablet Take 40 mg by mouth daily as needed (for reflux). For acid reflux      . simvastatin (ZOCOR) 40 MG tablet Take 40 mg by mouth at bedtime.        No current facility-administered medications for this visit.   OBJECTIVE: Filed Vitals:   01/07/14 1434  BP: 147/56  Pulse: 73  Temp: 98.4 F (36.9 C)  Resp: 16   Body mass index is 38.6 kg/(m^2). ECOG FS:0 - Asymptomatic Ocular: Sclerae unicteric, pupils equal, round and reactive to light Ear-nose-throat: Oropharynx clear, dentition fair Lymphatic: No cervical or supraclavicular adenopathy Lungs no rales or rhonchi, good excursion bilaterally Heart regular rate and rhythm, no murmur appreciated Abd soft, nontender, positive bowel sounds MSK no focal spinal tenderness, no joint edema Neuro: non-focal, well-oriented, appropriate affect Breasts: Deferred  LAB RESULTS: CMP     Component Value Date/Time   NA 142 01/07/2014 1348   NA 135 07/19/2012 0601   K 4.2 01/07/2014 1348   K 4.6 07/19/2012 0601   CL 98 01/07/2014 1348   CL 98 07/19/2012 0601   CO2 27 01/07/2014 1348   CO2 25 07/19/2012 0601   GLUCOSE 188* 01/07/2014 1348   GLUCOSE 75 07/19/2012 0601   BUN 25* 01/07/2014 1348   BUN 21 07/19/2012 0601   CREATININE 1.1 01/07/2014 1348   CREATININE 1.00 07/19/2012 0601   CALCIUM 8.8 01/07/2014 1348   CALCIUM 9.2 07/19/2012 0601   PROT 6.4 01/07/2014 1348   PROT 6.1 03/03/2012 0813   ALBUMIN 4.0 03/03/2012 0813   AST 20 01/07/2014 1348   AST 16 03/03/2012 0813    ALT 28 01/07/2014 1348   ALT 24 03/03/2012 0813   ALKPHOS 47 01/07/2014 1348   ALKPHOS 55 03/03/2012 0813   BILITOT 0.50 01/07/2014 1348   BILITOT 0.3 03/03/2012 0813   GFRNONAA 60* 07/19/2012 0601   GFRAA 70* 07/19/2012 0601   No results found for this basename: SPEP, UPEP,  kappa and lambda light chains   Lab Results  Component Value Date   WBC 9.0 01/07/2014   NEUTROABS 6.4 01/07/2014   HGB 11.3* 01/07/2014   HCT 32.8* 01/07/2014   MCV 99 01/07/2014   PLT 191 01/07/2014   No results found for this basename: LABCA2   No components found with this basename: LABCA125   No results found for this basename: INR,  in the last 168 hours Urinalysis No results found for this basename: colorurine, appearanceur, labspec, phurine, glucoseu, hgbur, bilirubinur, ketonesur, proteinur, urobilinogen, nitrite, leukocytesur   STUDIES: No results found.  ASSESSMENT/PLAN: Ms. Deane is 63 year old white female with multifactorial anemia. She is feeling much better since getting iron in July. She is asymptomatic at this time.  She has restarted her methotrexate so we will watch her blood counts with this.  Her Hgb is 11.3 today so she will not be getting Aranesp today.  We will wait and see what her iron studies show.  We will see her back in 2 months for labs and follow-up. She is in agree,emt with this and knows to call here with any questions or concerns. We can certainly see her sooner if need be.   Eliezer Bottom, NP 01/07/2014 3:07 PM

## 2014-01-08 LAB — IRON AND TIBC CHCC
%SAT: 41 % (ref 21–57)
Iron: 102 ug/dL (ref 41–142)
TIBC: 251 ug/dL (ref 236–444)
UIBC: 149 ug/dL (ref 120–384)

## 2014-01-08 LAB — FERRITIN CHCC: FERRITIN: 550 ng/mL — AB (ref 9–269)

## 2014-01-12 ENCOUNTER — Telehealth: Payer: Self-pay | Admitting: *Deleted

## 2014-01-12 NOTE — Telephone Encounter (Signed)
Message copied by Rico Ala on Tue Jan 12, 2014 10:27 AM ------      Message from: Burney Gauze R      Created: Mon Jan 11, 2014  8:17 AM       Call - iron is ok!!  pete ------

## 2014-03-04 ENCOUNTER — Ambulatory Visit (HOSPITAL_COMMUNITY)
Admission: RE | Admit: 2014-03-04 | Discharge: 2014-03-04 | Disposition: A | Discharge status: Home / Self Care | Payer: BC Managed Care – PPO | Source: Ambulatory Visit | Attending: Vascular Surgery | Admitting: Vascular Surgery

## 2014-03-04 ENCOUNTER — Other Ambulatory Visit (HOSPITAL_COMMUNITY): Payer: Self-pay | Admitting: Family Medicine

## 2014-03-04 DIAGNOSIS — L97509 Non-pressure chronic ulcer of other part of unspecified foot with unspecified severity: Secondary | ICD-10-CM | POA: Diagnosis not present

## 2014-03-04 DIAGNOSIS — E13621 Other specified diabetes mellitus with foot ulcer: Secondary | ICD-10-CM | POA: Insufficient documentation

## 2014-03-09 ENCOUNTER — Telehealth: Payer: Self-pay | Admitting: Hematology & Oncology

## 2014-03-09 NOTE — Telephone Encounter (Signed)
Patient called and cx 03/10/14 apt due to getting toe cut off.  She will call back to resch.

## 2014-03-10 ENCOUNTER — Ambulatory Visit: Payer: BC Managed Care – PPO

## 2014-03-10 ENCOUNTER — Ambulatory Visit: Payer: BC Managed Care – PPO | Admitting: Hematology & Oncology

## 2014-03-10 ENCOUNTER — Other Ambulatory Visit: Payer: BC Managed Care – PPO | Admitting: Lab

## 2014-04-08 ENCOUNTER — Other Ambulatory Visit: Payer: Self-pay | Admitting: Orthopaedic Surgery

## 2014-04-08 DIAGNOSIS — M25561 Pain in right knee: Secondary | ICD-10-CM

## 2014-04-14 ENCOUNTER — Ambulatory Visit: Payer: BC Managed Care – PPO | Admitting: Internal Medicine

## 2014-04-16 ENCOUNTER — Ambulatory Visit
Admission: RE | Admit: 2014-04-16 | Discharge: 2014-04-16 | Disposition: A | Discharge status: Home / Self Care | Payer: BC Managed Care – PPO | Source: Ambulatory Visit | Attending: Orthopaedic Surgery | Admitting: Orthopaedic Surgery

## 2014-04-16 DIAGNOSIS — M25561 Pain in right knee: Secondary | ICD-10-CM

## 2014-05-12 ENCOUNTER — Ambulatory Visit (INDEPENDENT_AMBULATORY_CARE_PROVIDER_SITE_OTHER): Payer: BC Managed Care – PPO | Admitting: Podiatry

## 2014-05-12 ENCOUNTER — Encounter: Payer: Self-pay | Admitting: Podiatry

## 2014-05-12 VITALS — BP 174/79 | HR 80 | Ht 64.0 in | Wt 217.0 lb

## 2014-05-12 DIAGNOSIS — B351 Tinea unguium: Secondary | ICD-10-CM

## 2014-05-12 DIAGNOSIS — M79606 Pain in leg, unspecified: Secondary | ICD-10-CM

## 2014-05-12 DIAGNOSIS — L97519 Non-pressure chronic ulcer of other part of right foot with unspecified severity: Secondary | ICD-10-CM

## 2014-05-12 NOTE — Progress Notes (Signed)
Subjective: 64 year old female presents for diabetic foot care.  Blood sugar was 167 this morning. Been diabetic x 8 years.   Objective: Absent of 2nd digit bilateral. Positive history of osteomyelitis. Ulcerating callus under the first MPJ right foot. Thick dystrophic nails x 10. All pedal pulses are palpable. Peeling and dry scaly skin both feet dorsum and plantar surface.  Assessment: Onychomycosis x 10. Ulcerating callus under the first MPJ right. Peeling xerotic skin bilateral.   Plan: All debrided and padded. Return in 2 weeks.

## 2014-05-12 NOTE — Patient Instructions (Signed)
Seen for hypertrophic nails and ulcerating callus under the first big joint right. All debrided and padded. Keep the pad in place and return in 2 weeks.

## 2014-05-26 ENCOUNTER — Ambulatory Visit: Payer: BC Managed Care – PPO | Admitting: Podiatry

## 2014-06-01 ENCOUNTER — Encounter: Payer: Self-pay | Admitting: Internal Medicine

## 2014-06-10 ENCOUNTER — Other Ambulatory Visit: Payer: Self-pay | Admitting: Family

## 2014-08-11 ENCOUNTER — Other Ambulatory Visit: Payer: Self-pay | Admitting: Family Medicine

## 2014-08-11 ENCOUNTER — Ambulatory Visit
Admission: RE | Admit: 2014-08-11 | Discharge: 2014-08-11 | Disposition: A | Discharge status: Home / Self Care | Payer: BC Managed Care – PPO | Source: Ambulatory Visit | Attending: Family Medicine | Admitting: Family Medicine

## 2014-08-11 DIAGNOSIS — R05 Cough: Secondary | ICD-10-CM

## 2014-08-11 DIAGNOSIS — R059 Cough, unspecified: Secondary | ICD-10-CM

## 2014-08-11 DIAGNOSIS — R079 Chest pain, unspecified: Secondary | ICD-10-CM

## 2015-12-29 ENCOUNTER — Other Ambulatory Visit: Payer: Self-pay | Admitting: Family Medicine

## 2015-12-29 DIAGNOSIS — R109 Unspecified abdominal pain: Secondary | ICD-10-CM

## 2015-12-30 ENCOUNTER — Ambulatory Visit
Admission: RE | Admit: 2015-12-30 | Discharge: 2015-12-30 | Disposition: A | Discharge status: Home / Self Care | Payer: BC Managed Care – PPO | Source: Ambulatory Visit | Attending: Family Medicine | Admitting: Family Medicine

## 2015-12-30 DIAGNOSIS — R109 Unspecified abdominal pain: Secondary | ICD-10-CM

## 2016-01-03 ENCOUNTER — Encounter: Payer: Self-pay | Admitting: Internal Medicine

## 2016-01-03 ENCOUNTER — Telehealth: Payer: Self-pay | Admitting: Internal Medicine

## 2016-01-03 NOTE — Telephone Encounter (Signed)
Patient refused appointment with Janett Billow tomorrow at 3pm states she would like to wait to see Dr.Pyrtle until 11/20. Patient has been scheduled and notified of appointment.

## 2016-01-03 NOTE — Telephone Encounter (Signed)
Pt scheduled to see Alonza Bogus PA tomorrow at 3pm. Please notify pt of appt date and time.

## 2016-01-04 ENCOUNTER — Ambulatory Visit: Payer: BC Managed Care – PPO | Admitting: Gastroenterology

## 2016-01-04 ENCOUNTER — Other Ambulatory Visit: Payer: BC Managed Care – PPO

## 2016-02-20 ENCOUNTER — Other Ambulatory Visit: Payer: Self-pay

## 2016-02-20 ENCOUNTER — Ambulatory Visit (INDEPENDENT_AMBULATORY_CARE_PROVIDER_SITE_OTHER): Payer: BC Managed Care – PPO | Admitting: Internal Medicine

## 2016-02-20 ENCOUNTER — Encounter: Payer: Self-pay | Admitting: Internal Medicine

## 2016-02-20 VITALS — BP 142/80 | HR 75 | Ht 63.5 in | Wt 211.0 lb

## 2016-02-20 DIAGNOSIS — E1165 Type 2 diabetes mellitus with hyperglycemia: Secondary | ICD-10-CM

## 2016-02-20 DIAGNOSIS — E1142 Type 2 diabetes mellitus with diabetic polyneuropathy: Secondary | ICD-10-CM

## 2016-02-20 DIAGNOSIS — IMO0002 Reserved for concepts with insufficient information to code with codable children: Secondary | ICD-10-CM

## 2016-02-20 MED ORDER — TRULICITY 1.5 MG/0.5ML ~~LOC~~ SOAJ: SUBCUTANEOUS | 3 refills | Status: DC

## 2016-02-20 MED ORDER — METFORMIN HCL 1000 MG PO TABS: 1000.0000 mg | ORAL_TABLET | Freq: Two times a day (BID) | ORAL | 1 refills | Status: DC

## 2016-02-20 MED ORDER — ONETOUCH VERIO VI STRP: ORAL_STRIP | 11 refills | Status: DC

## 2016-02-20 MED ORDER — DULAGLUTIDE 0.75 MG/0.5ML ~~LOC~~ SOAJ: SUBCUTANEOUS | 1 refills | Status: DC

## 2016-02-20 MED ORDER — JARDIANCE 25 MG PO TABS: ORAL_TABLET | ORAL | 3 refills | Status: DC

## 2016-02-20 NOTE — Patient Instructions (Signed)
Please continue: - Metformin ER 1000 mg 2x a day, with meals - Jardiance 25 mg in am  Please restart Trulicity [id number] mg weekly. Let me know when you need a refill >> we will need to send the higher Trulicity dose.  Please schedule an appt with Antonieta Iba with nutrition.  Please return in 1.5 months with your sugar log.   PATIENT INSTRUCTIONS FOR TYPE 2 DIABETES:  **Please join MyChart!** - see attached instructions about how to join if you have not done so already.  DIET AND EXERCISE Diet and exercise is an important part of diabetic treatment.  We recommended aerobic exercise in the form of brisk walking (working between 40-60% of maximal aerobic capacity, similar to brisk walking) for 150 minutes per week (such as 30 minutes five days per week) along with 3 times per week performing 'resistance' training (using various gauge rubber tubes with handles) 5-10 exercises involving the major muscle groups (upper body, lower body and core) performing 10-15 repetitions (or near fatigue) each exercise. Start at half the above goal but build slowly to reach the above goals. If limited by weight, joint pain, or disability, we recommend daily walking in a swimming pool with water up to waist to reduce pressure from joints while allow for adequate exercise.    BLOOD GLUCOSES Monitoring your blood glucoses is important for continued management of your diabetes. Please check your blood glucoses 2-4 times a day: fasting, before meals and at bedtime (you can rotate these measurements - e.g. one day check before the 3 meals, the next day check before 2 of the meals and before bedtime, etc.).   HYPOGLYCEMIA (low blood sugar) Hypoglycemia is usually a reaction to not eating, exercising, or taking too much insulin/ other diabetes drugs.  Symptoms include tremors, sweating, hunger, confusion, headache, etc. Treat IMMEDIATELY with 15 grams of Carbs: . 4 glucose tablets .  cup regular juice/soda . 2 tablespoons  raisins . 4 teaspoons sugar . 1 tablespoon honey Recheck blood glucose in 15 mins and repeat above if still symptomatic/blood glucose <100.  RECOMMENDATIONS TO REDUCE YOUR RISK OF DIABETIC COMPLICATIONS: * Take your prescribed MEDICATION(S) * Follow a DIABETIC diet: Complex carbs, fiber rich foods, (monounsaturated and polyunsaturated) fats * AVOID saturated/trans fats, high fat foods, >2,300 mg salt per day. * EXERCISE at least 5 times a week for 30 minutes or preferably daily.  * DO NOT SMOKE OR DRINK more than 1 drink a day. * Check your FEET every day. Do not wear tightfitting shoes. Contact us if you develop an ulcer * See your EYE doctor once a year or more if needed * Get a FLU shot once a year * Get a PNEUMONIA vaccine once before and once after age 62 years  GOALS:  * Your Hemoglobin A1c of <7%  * fasting sugars need to be <130 * after meals sugars need to be <180 (2h after you start eating) * Your Systolic BP should be [id number] or lower  * Your Diastolic BP should be 80 or lower  * Your HDL (Good Cholesterol) should be 40 or higher  * Your LDL (Bad Cholesterol) should be 100 or lower. * Your Triglycerides should be 150 or lower  * Your Urine microalbumin (kidney function) should be <30 * Your Body Mass Index should be 25 or lower    Please consider the following ways to cut down carbs and fat and increase fiber and micronutrients in your diet: - substitute whole grain for white  bread or pasta - substitute brown rice for white rice - substitute 90-calorie flat bread pieces for slices of bread when possible - substitute sweet potatoes or yams for white potatoes - substitute humus for margarine - substitute tofu for cheese when possible - substitute almond or rice milk for regular milk (would not drink soy milk daily due to concern for soy estrogen influence on breast cancer risk) - substitute dark chocolate for other sweets when possible - substitute water - can add lemon or  orange slices for taste - for diet sodas (artificial sweeteners will trick your body that you can eat sweets without getting calories and will lead you to overeating and weight gain in the long run) - do not skip breakfast or other meals (this will slow down the metabolism and will result in more weight gain over time)  - can try smoothies made from fruit and almond/rice milk in am instead of regular breakfast - can also try old-fashioned (not instant) oatmeal made with almond/rice milk in am - order the dressing on the side when eating salad at a restaurant (pour less than half of the dressing on the salad) - eat as little meat as possible - can try juicing, but should not forget that juicing will get rid of the fiber, so would alternate with eating raw veg./fruits or drinking smoothies - use as little oil as possible, even when using olive oil - can dress a salad with a mix of balsamic vinegar and lemon juice, for e.g. - use agave nectar, stevia sugar, or regular sugar rather than artificial sweateners - steam or broil/roast veggies  - snack on veggies/fruit/nuts (unsalted, preferably) when possible, rather than processed foods - reduce or eliminate aspartame in diet (it is in diet sodas, chewing gum, etc) Read the labels!  Try to read Dr. Janene Harvey book: "Program for Reversing Diabetes" for other ideas for healthy eating.

## 2016-02-20 NOTE — Progress Notes (Signed)
Patient ID: [name], female   DOB: [birth year], 64 y.o.   MRN: [id number]   HPI: [name] is a 65 y.o.-year-old female, referred by her PCP, Dr. Brigitte Pulse, for management of DM2, dx in 1980s, non-insulin-dependent, uncontrolled, with complications (PN - s/p B digit amputations 2/2 osteomyelitis). She saw Dr. Elyse Hsu previously.   Last hemoglobin A1c was: 02/16/2016: 7.1% 06/2015: 6.6% Lab Results  Component Value Date   HGBA1C 7.3 (H) 02/10/2012   Pt is on a regimen of: - Metformin ER 1000 mg 2x a day, with meals - Jardiance 25 mg in am She was taken off Trulicity 1.5 mg weekly and also Metformin during the summer (diverticulitis) >> then restarted Metformin, but not Trulicity. She was on Victoza >> eash.  Pt checks her sugars 1-2x a day and they are: - am: [id number], [id number] (on Trulicity [id number]) - 2h after b'fast: n/c - before lunch: n/c - 2h after lunch: 150s - before dinner: n/c - 2h after dinner: n/c - bedtime: 150-170 - nighttime: n/c No lows. Lowest sugar was 119; she has hypoglycemia awareness at 99.  Highest sugar was 300s.  Glucometer: One Touch Verio  Pt's meals are: - Breakfast: [place] bacon, 2 eggs, toast; oatmeal; or brown rice - Lunch: Tuna, crackers or sandwich, chicken strips, chips, diet Pepsi - Dinner: Meat, veggies, starch  - Snacks: 2-3: 15g carbs (pretzels, chips)  For exercise, she walks or rides stationary bike 4 times a week.  - no CKD, last BUN/creatinine:  02/16/2016: 42/1.34 (GFR 40), Glu 213  12/26/2015: 25/1.23 (GFR 44), Glu 153 Lab Results  Component Value Date   BUN 25 (H) 01/07/2014   BUN 21 07/19/2012   CREATININE 1.1 01/07/2014   CREATININE 1.00 07/19/2012  On lisinopril. - last set of lipids: 02/16/2016: 128/278/33/39  03/02/2014: 114/175/32/48  On Zocor. - last eye exam was in 09/2015 (Dr. Gershon Crane). No DR.  - + numbness and tingling in her feet.  Pt has FH of DM in F.  She also has a history of HTN, HL, reflux,  anemia.  ROS: Constitutional: no weight gain/loss,+  fatigue, no subjective hyperthermia/hypothermia Eyes: no blurry vision, no xerophthalmia ENT: no sore throat, no nodules palpated in throat, no dysphagia/odynophagia, no hoarseness, + occasional tinnitus  Cardiovascular: no CP/SOB/palpitations/leg swelling Respiratory: + cough/no SOB Gastrointestinal:+ All:  N/V/D/heartburn  Musculoskeletal:+ Both:e/joint aches Skin:+ Rash, easy bruising, hair loss.  Neurological: no tremors/numbness/tingling/dizziness Psychiatric:+ both:  depression/anxiety  Past Medical History:  Diagnosis Date  . Anemia in chronic renal disease 06/05/2011  . Anxiety    takes Ativan tid  . Asthma    has an inhaler prn  . Cough   . DDD (degenerative disc disease), cervical 06/05/2011  . DDD (degenerative disc disease), lumbosacral 06/05/2011  . Depression    takes Cymbalta daily  . Diabetes mellitus    takes Amaryl and Metformin daily  . Diabetic neuropathy ([place])   . Diarrhea   . Dizziness   . GERD (gastroesophageal reflux disease)    takes Protonix prn  . Headache(784.0)    occasionally  . History of bronchitis    last time 72yrs ago  . History of colon polyps   . History of MRSA infection   . HTN (hypertension) 06/05/2011   takes Prinizide daily  . Hyperlipidemia    takes Zocor daily  . Insomnia    not on any meds at present time  . PONV (postoperative nausea and vomiting)    hard to wake up  .  Psoriasis 06/05/2011   on legs and uses cream prn  . Seasonal allergies    but doesn't take any meds  . Sleep apnea    uses CPAP   Past Surgical History:  Procedure Laterality Date  . ABDOMINAL HYSTERECTOMY  1982 and 1984  . AMPUTATION  02/12/2012   Procedure: AMPUTATION RAY;  Surgeon: Wylene Simmer, MD;  Location: [facility];  Service: Orthopedics;  Laterality: Right;  RIGHT 2ND TOE AMPUTATION   . BACK SURGERY     x 3  . CARDIAC CATHETERIZATION     08/09/04: 50% mid AV groove CX lesion, NL LM, LAD, Ramus,  EF > 60% (Dr. Gwenlyn Found)  . CARPAL TUNNEL RELEASE Bilateral 2010  . CERVICAL SPINE SURGERY    . CHOLECYSTECTOMY  1977  . COLONOSCOPY    . DILATION AND CURETTAGE OF UTERUS    . ELBOW SURGERY     left d/t tendonitis  . ESOPHAGOGASTRODUODENOSCOPY    . LUMBAR WOUND DEBRIDEMENT N/A 07/15/2012   Procedure: I & D of Lumbar Wound: Possible Repair of CSF Leak;  Surgeon: Elaina Hoops, MD;  Location: [facility];  Service: Neurosurgery;  Laterality: N/A;  I & D of Lumbar Wound: Possible Repair of CSF Leak placement lumbar drain  . right knee arthroscopy     x 2  . SHOULDER ARTHROSCOPY     Social History   Social History  . Marital status: Married    Spouse name: N/A  . Number of children: 2   Occupational History  . Disabled    Social History Main Topics  . Smoking status: Never Smoker  . Smokeless tobacco: Never Used     Comment: never used tobacco  . Alcohol use No  . Drug use: No   Current Outpatient Prescriptions on File Prior to Visit  Medication Sig Dispense Refill  . albuterol (PROVENTIL HFA;VENTOLIN HFA) 108 (90 BASE) MCG/ACT inhaler Inhale 1 puff into the lungs as needed for shortness of breath.     Marland Kitchen aspirin 81 MG tablet Take 81 mg by mouth daily.    . clobetasol (OLUX) 0.05 % topical foam Apply 1 application topically daily as needed (apply to head for dry scalp).     . cyclobenzaprine (FLEXERIL) 10 MG tablet Take 10 mg by mouth as needed for muscle spasms.    . DULoxetine (CYMBALTA) 60 MG capsule Take 90 mg by mouth daily.     Marland Kitchen lisinopril-hydrochlorothiazide (PRINZIDE,ZESTORETIC) 20-25 MG per tablet Take 1 tablet by mouth daily.    Marland Kitchen LORazepam (ATIVAN) 1 MG tablet Take 1 mg by mouth daily as needed.     . Multiple Vitamin (MULTI-VITAMIN DAILY PO) Take 1 tablet by mouth daily.     . pantoprazole (PROTONIX) 40 MG tablet Take 40 mg by mouth daily as needed (for reflux). For acid reflux    . simvastatin (ZOCOR) 40 MG tablet Take 40 mg by mouth at bedtime.     Marland Kitchen glimepiride  (AMARYL) 4 MG tablet Take 8 mg by mouth daily after supper.      No current facility-administered medications on file prior to visit.    Allergies  Allergen Reactions  . Victoza [Liraglutide]     Severe rash and hives.  . Dilaudid [Hydromorphone]     Rash and swelling  . Ultram [Tramadol] Rash  . Betadine [Povidone Iodine] Other (See Comments)    Skin peels  . Bydureon [Exenatide] Diarrhea and Nausea And Vomiting  . Codeine     Severe  headaches  . Contrast Media [Iodinated Diagnostic Agents] Nausea And Vomiting  . Hydrocodone Other (See Comments)    headache  . Iodine Hives    Iodine hives  . Januvia [Sitagliptin] Diarrhea  . Morphine And Related Nausea And Vomiting  . Percocet [Oxycodone-Acetaminophen] Other (See Comments)    Headache   . Septra [Sulfamethoxazole-Trimethoprim]     rash  . Sulfa Antibiotics Itching  . Valium Other (See Comments)    headache  . Vicodin [Hydrocodone-Acetaminophen] Other (See Comments)    headache  . Amoxicillin Hives and Rash  . Darvocet [Propoxyphene N-Acetaminophen] Rash and Other (See Comments)    headache  . Fentanyl And Related Rash  . Wellbutrin [Bupropion Hcl] Rash   Family History  Problem Relation Age of Onset  . Colon cancer Brother 49    PE: BP (!) 142/80 (BP Location: Left Arm, Patient Position: Sitting)   Pulse 75   Ht 5' 3.5" (1.613 m)   Wt 211 lb (95.7 kg)   SpO2 97%   BMI 36.79 kg/m  Wt Readings from Last 3 Encounters:  02/20/16 211 lb (95.7 kg)  05/12/14 217 lb (98.4 kg)  04/16/14 220 lb (99.8 kg)   Constitutional: overweight, in NAD Eyes: anisocoria - R pupil larger and less reactive to light, EOMI, no exophthalmos ENT: moist mucous membranes, no thyromegaly, no cervical lymphadenopathy Cardiovascular: RRR, No MRG Respiratory: CTA B Gastrointestinal: abdomen soft, NT, ND, BS+ Musculoskeletal: no deformities, strength intact in all 4 Skin: moist, warm, no rashes Neurological: no tremor with outstretched  hands, DTR normal in all 4  ASSESSMENT: 1. DM2, non-insulin-dependent, uncontrolled, with complications: - PN - s/p B 2nd toe amputations 2/2 osteomyelitis - 2013 and 2015  PLAN:  1. Patient with long-standing, uncontrolled diabetes, on oral antidiabetic regimen. Latest hbA1c was 7.2% several days ago.  - She mentioned that her sugars were well-controlled when she was on Trulicity and a greatly increased after stopping both metformin and Trulicity this summer when she had an episode of diverticulitis. Since then, since her symptoms resolved, she restarted the metformin but did not restart Trulicity yet. She was tolerating the medication well, except for fatigue after she told the first dose. I suggested to restart Trulicity since she did very well with it and this was covered by her insurance. She agrees. We will start the low-dose for 4 weeks then increase to the target dose. - We also discussed about diet and I made specific suggestions about reducing carbs and fats; we'll also refer her to nutrition - I suggested to:  Patient Instructions  Please continue: - Metformin ER 1000 mg 2x a day, with meals - Jardiance 25 mg in am  Please restart Trulicity A999333 mg weekly. Let me know when you need a refill >> we will need to send the higher Trulicity dose.  Please schedule an appt with Antonieta Iba with nutrition.  Please return in 1.5 months with your sugar log.   - I advised her to check sugars at different times of the day - check 1-2 times a day, rotating checks - given sugar log and advised how to fill it and to bring it at next appt  - given foot care handout and explained the principles  - given instructions for hypoglycemia management "15-15 rule"  - advised for yearly eye exams >> she is UTD - Return to clinic in 1.5 mo with sugar log   Philemon Kingdom, MD PhD [facility] Endocrinology

## 2016-03-12 ENCOUNTER — Encounter: Payer: Self-pay | Admitting: *Deleted

## 2016-03-14 ENCOUNTER — Telehealth: Payer: Self-pay | Admitting: Internal Medicine

## 2016-03-14 NOTE — Telephone Encounter (Signed)
Pt was told to call when she finished her Trulicity, and she took the last shot today.

## 2016-03-14 NOTE — Telephone Encounter (Signed)
See message to be advised, Thanks! 

## 2016-03-15 ENCOUNTER — Other Ambulatory Visit: Payer: Self-pay

## 2016-03-15 ENCOUNTER — Telehealth: Payer: Self-pay

## 2016-03-15 MED ORDER — TRULICITY 1.5 MG/0.5ML ~~LOC~~ SOAJ: SUBCUTANEOUS | 5 refills | Status: DC

## 2016-03-15 NOTE — Telephone Encounter (Signed)
Let's try the 1.5 mg weekly. 4 pens with 5 refills.

## 2016-03-15 NOTE — Telephone Encounter (Signed)
Requested a call back from the patient to discuss.  

## 2016-03-15 NOTE — Telephone Encounter (Signed)
Patient called and gave blood sugars reading for the past week while on Trulicity:  November 3rd- before breakfast; 126 November 4th- am 144 November 5th- 10am; 148 November 6th- before breakfast; 125 November 7th- 10am; 139 November 8th- 5pm; 145  Please advise if she is okay to stay on this, as she will need it called in if no changes.

## 2016-03-15 NOTE — Telephone Encounter (Signed)
Jinny Blossom, can you please check with her if her sugars are much better, we can continue with the same dose of Trulicity (A999333 mg weekly). If they are lower, but still not at goal, we need to increase the dose (1.5 mg weekly). Ok to send one of the other depending on her response.

## 2016-03-16 ENCOUNTER — Encounter: Payer: BC Managed Care – PPO | Admitting: Dietician

## 2016-03-26 ENCOUNTER — Encounter (INDEPENDENT_AMBULATORY_CARE_PROVIDER_SITE_OTHER): Payer: Self-pay

## 2016-03-26 ENCOUNTER — Encounter: Payer: Self-pay | Admitting: Internal Medicine

## 2016-03-26 ENCOUNTER — Ambulatory Visit (INDEPENDENT_AMBULATORY_CARE_PROVIDER_SITE_OTHER): Payer: BC Managed Care – PPO | Admitting: Internal Medicine

## 2016-03-26 VITALS — BP 126/60 | HR 80 | Ht 63.5 in | Wt 211.4 lb

## 2016-03-26 DIAGNOSIS — K5712 Diverticulitis of small intestine without perforation or abscess without bleeding: Secondary | ICD-10-CM | POA: Diagnosis not present

## 2016-03-26 DIAGNOSIS — R933 Abnormal findings on diagnostic imaging of other parts of digestive tract: Secondary | ICD-10-CM

## 2016-03-26 DIAGNOSIS — K219 Gastro-esophageal reflux disease without esophagitis: Secondary | ICD-10-CM

## 2016-03-26 MED ORDER — RANITIDINE HCL 150 MG PO TABS: 150.0000 mg | ORAL_TABLET | Freq: Every evening | ORAL | 2 refills | Status: DC

## 2016-03-26 NOTE — Progress Notes (Signed)
Patient ID: Sherri SCHNETTLER, female   DOB: [birth year], 65 y.o.   MRN: [id number] HPI: [name] is a 65 year old female with a history of adenomatous colon polyps, family history of colon cancer in her brother, recent diagnosis of small bowel diverticulitis treated with antibiotics, GERD, hypertension, hyperlipidemia, obesity and diabetes who is seen in consultation at the request of Dr. Mayra Neer to follow-up small bowel diverticulitis. She reports in August she developed burning and twisting and cramping midabdominal pain. This was associated with bloating, diarrhea and nausea. She reports the pain is intense. She had blood work which revealed leukocytosis and a CT scan which showed focal inflammatory small bowel enteritis concerning for diverticulitis. She was treated with antibiotics and anti-spasmodics. She reports 10 days of antibiotics and symptoms did improve tremendously. She still deals with intermittent burning and bloating but this is more chronic for her and not severe. She does report taking pantoprazole 40 mg daily and recently has had some afternoon breakthrough reflux and heartburn symptoms. She denies dysphagia or odynophagia.  Dr. Olevia Perches performed a colonoscopy for her 34. This revealed a 3-7 mm sigmoid polyp which was removed and found to be a tubular adenoma. There is no evidence of colonic diverticulosis noted on this examination. Five-year recall was recommended  Past Medical History:  Diagnosis Date  . Anemia in chronic renal disease 06/05/2011  . Anxiety    takes Ativan tid  . Asthma    has an inhaler prn  . Cough   . DDD (degenerative disc disease), cervical 06/05/2011  . DDD (degenerative disc disease), lumbosacral 06/05/2011  . Depression    takes Cymbalta daily  . Diabetes mellitus    takes Amaryl and Metformin daily  . Diabetic neuropathy ([place])   . Diverticulosis   . Dizziness   . GERD (gastroesophageal reflux disease)    takes Protonix prn  .  Headache(784.0)    occasionally  . History of bronchitis    last time 85yrs ago  . History of colon polyps   . History of MRSA infection   . HTN (hypertension) 06/05/2011   takes Prinizide daily  . Hyperlipidemia    takes Zocor daily  . Insomnia    not on any meds at present time  . Osteoarthritis   . PONV (postoperative nausea and vomiting)    hard to wake up  . Psoriasis 06/05/2011   on legs and uses cream prn  . Seasonal allergies    but doesn't take any meds  . Sleep apnea    uses CPAP  . Tubular adenoma of colon     Past Surgical History:  Procedure Laterality Date  . ABDOMINAL HYSTERECTOMY  1982 and 1984  . AMPUTATION  02/12/2012   Procedure: AMPUTATION RAY;  Surgeon: Wylene Simmer, MD;  Location: [facility];  Service: Orthopedics;  Laterality: Right;  RIGHT 2ND TOE AMPUTATION   . BACK SURGERY     x 3  . CARDIAC CATHETERIZATION     08/09/04: 50% mid AV groove CX lesion, NL LM, LAD, Ramus, EF > 60% (Dr. Gwenlyn Found)  . CARPAL TUNNEL RELEASE Bilateral 2010  . CERVICAL SPINE SURGERY    . CHOLECYSTECTOMY  1977  . COLONOSCOPY    . DILATION AND CURETTAGE OF UTERUS    . ELBOW SURGERY     left d/t tendonitis  . ESOPHAGOGASTRODUODENOSCOPY    . LUMBAR WOUND DEBRIDEMENT N/A 07/15/2012   Procedure: I & D of Lumbar Wound: Possible Repair of CSF Leak;  Surgeon: Dominica Severin  Levy Sjogren, MD;  Location: [place] [facility];  Service: Neurosurgery;  Laterality: N/A;  I & D of Lumbar Wound: Possible Repair of CSF Leak placement lumbar drain  . right knee arthroscopy     x 2  . SHOULDER ARTHROSCOPY Left   . TOE AMPUTATION Left     Outpatient Medications Prior to Visit  Medication Sig Dispense Refill  . albuterol (PROVENTIL HFA;VENTOLIN HFA) 108 (90 BASE) MCG/ACT inhaler Inhale 1 puff into the lungs as needed for shortness of breath.     Marland Kitchen aspirin 81 MG tablet Take 81 mg by mouth daily.    . clobetasol (OLUX) 0.05 % topical foam Apply 1 application topically daily as needed (apply to head for dry scalp).     .  cyclobenzaprine (FLEXERIL) 10 MG tablet Take 10 mg by mouth as needed for muscle spasms.    . DULoxetine (CYMBALTA) 60 MG capsule Take 90 mg by mouth daily.     Marland Kitchen JARDIANCE 25 MG TABS tablet Take every day. 30 tablet 3  . lisinopril-hydrochlorothiazide (PRINZIDE,ZESTORETIC) 20-25 MG per tablet Take 1 tablet by mouth daily.    Marland Kitchen LORazepam (ATIVAN) 1 MG tablet Take 1 mg by mouth daily as needed.     . metFORMIN (GLUCOPHAGE) 1000 MG tablet Take 1 tablet (1,000 mg total) by mouth 2 (two) times daily with a meal. 180 tablet 1  . metoprolol succinate (TOPROL-XL) 25 MG 24 hr tablet     . Multiple Vitamin (MULTI-VITAMIN DAILY PO) Take 1 tablet by mouth daily.     . ondansetron (ZOFRAN) 8 MG tablet TAKE 1 TABLET BY MOUTH 3 TIMES A DAY AS NEEDED NAUSEA (INS PAYS #18/21 DAYS)  0  . ONETOUCH VERIO test strip USE TO TEST BLOOD SUGARS 2 times daily (DX: E11.65) 100 each 11  . pantoprazole (PROTONIX) 40 MG tablet Take 40 mg by mouth daily as needed (for reflux). For acid reflux    . simvastatin (ZOCOR) 40 MG tablet Take 40 mg by mouth at bedtime.     . traMADol (ULTRAM) 50 MG tablet TAKE 1 OR 2 TABLETS BY MOUTH EVERY 6 TO 8 HOURS AS NEEDED FOR PAIN  0  . TRULICITY 1.5 0000000 SOPN INJECT ONCE A WEEK 4 pen 5  . Dulaglutide (TRULICITY) A999333 0000000 SOPN Please inject under skin 0.75 mg weekly. 4 pen 1  . glimepiride (AMARYL) 4 MG tablet Take 8 mg by mouth daily after supper.      No facility-administered medications prior to visit.     Allergies  Allergen Reactions  . Victoza [Liraglutide]     Severe rash and hives.  . Dilaudid [Hydromorphone]     Rash and swelling  . Ultram [Tramadol] Rash  . Betadine [Povidone Iodine] Other (See Comments)    Skin peels  . Bydureon [Exenatide] Diarrhea and Nausea And Vomiting  . Codeine     Severe headaches  . Contrast Media [Iodinated Diagnostic Agents] Nausea And Vomiting  . Hydrocodone Other (See Comments)    headache  . Iodine Hives    Iodine hives  .  Januvia [Sitagliptin] Diarrhea  . Morphine And Related Nausea And Vomiting  . Percocet [Oxycodone-Acetaminophen] Other (See Comments)    Headache   . Septra [Sulfamethoxazole-Trimethoprim]     rash  . Sulfa Antibiotics Itching  . Valium Other (See Comments)    headache  . Vicodin [Hydrocodone-Acetaminophen] Other (See Comments)    headache  . Amoxicillin Hives and Rash  . Darvocet [Propoxyphene N-Acetaminophen] Rash and  Other (See Comments)    headache  . Fentanyl And Related Rash  . Wellbutrin [Bupropion Hcl] Rash    Family History  Problem Relation Age of Onset  . Colon cancer Brother 14  . Hypertension Mother     died from infection s/p hip surgery  . Heart attack Father   . Colon cancer Maternal Grandfather   . Stomach cancer Neg Hx   . Esophageal cancer Neg Hx   . Rectal cancer Neg Hx   . Liver cancer Neg Hx     Social History  Substance Use Topics  . Smoking status: Never Smoker  . Smokeless tobacco: Never Used     Comment: never used tobacco  . Alcohol use No    ROS: As per history of present illness, otherwise negative  BP 126/60   Pulse 80   Ht 5' 3.5" (1.613 m)   Wt 211 lb 6 oz (95.9 kg)   BMI 36.86 kg/m  Constitutional: Well-developed and well-nourished. No distress. HEENT: Normocephalic and atraumatic. Oropharynx is clear and moist. No oropharyngeal exudate. Conjunctivae are normal.  No scleral icterus. Neck: Neck supple. Trachea midline. Cardiovascular: Normal rate, regular rhythm and intact distal pulses. No M/R/G Pulmonary/chest: Effort normal and breath sounds normal. No wheezing, rales or rhonchi. Abdominal: Soft, nontender, nondistended. Bowel sounds active throughout. There are no masses palpable. No hepatosplenomegaly. Extremities: no clubbing, cyanosis, or edema Lymphadenopathy: No cervical adenopathy noted. Neurological: Alert and oriented to person place and time. Skin: Skin is warm and dry. No rashes noted. Psychiatric: Normal mood  and affect. Behavior is normal.  RELEVANT LABS AND IMAGING: CBC    Component Value Date/Time   WBC 9.0 01/07/2014 1348   WBC 19.2 (H) 07/21/2012 2104   RBC 3.30 (L) 01/07/2014 1348   RBC 3.36 (L) 01/07/2014 1348   RBC 3.68 (L) 07/21/2012 2104   HGB 11.3 (L) 01/07/2014 1348   HCT 32.8 (L) 01/07/2014 1348   PLT 191 01/07/2014 1348   MCV 99 01/07/2014 1348   MCH 34.2 (H) 01/07/2014 1348   MCH 32.3 07/21/2012 2104   MCHC 34.5 01/07/2014 1348   MCHC 35.0 07/21/2012 2104   RDW 12.4 01/07/2014 1348   LYMPHSABS 1.8 01/07/2014 1348   MONOABS 0.7 02/09/2012 1340   EOSABS 0.2 01/07/2014 1348   BASOSABS 0.0 01/07/2014 1348    CMP     Component Value Date/Time   NA 142 01/07/2014 1348   K 4.2 01/07/2014 1348   CL 98 01/07/2014 1348   CO2 27 01/07/2014 1348   GLUCOSE 188 (H) 01/07/2014 1348   BUN 25 (H) 01/07/2014 1348   CREATININE 1.1 01/07/2014 1348   CALCIUM 8.8 01/07/2014 1348   PROT 6.4 01/07/2014 1348   ALBUMIN 3.5 01/07/2014 1348   AST 20 01/07/2014 1348   ALT 28 01/07/2014 1348   ALKPHOS 47 01/07/2014 1348   BILITOT 0.50 01/07/2014 1348   GFRNONAA 60 (L) 07/19/2012 0601   GFRAA 70 (L) 07/19/2012 0601   CT ABDOMEN AND PELVIS WITHOUT CONTRAST   TECHNIQUE: Multidetector CT imaging of the abdomen and pelvis was performed following the standard protocol without IV contrast.   COMPARISON:  None.   FINDINGS: Lung bases shows linear scarring bilateral anteriorly. Small hiatal hernia.   The study is limited without IV contrast. Postcholecystectomy surgical clips are noted.   Unenhanced pancreas is unremarkable.   Unenhanced spleen is unremarkable.   Unenhanced kidneys are symmetrical in size. No hydronephrosis or hydroureter. No nephrolithiasis. No calcified ureteral calculi.  The urinary bladder is under distended. Nonspecific mild thickening of urinary bladder wall.   There is no gastric outlet obstruction. Axial image 57 58 59 and 60 there is inflammatory  process in left lower quadrant with thickening of small bowel wall and stranding of adjacent mesenteric fat. Few small bowel diverticula are seen. Findings are highly suspicious for segmental/focal enteritis or diverticulitis. No definite evidence of extraluminal contrast material to suggest definite perforation. Contrast material noted in right colon and cecum. No evidence of small bowel obstruction. No colonic obstruction. The descending colon and sigmoid colon is empty partially collapsed. Some residual stool noted within rectum.   No evidence of free abdominal air. No significant abdominal ascites. Small nonspecific retroperitoneal lymph nodes are noted without adenopathy. No significant mesenteric adenopathy. No aortic aneurysm.   The patient is status post hysterectomy.   Sagittal images of the spine shows posterior metallic fusion [id number] level. Postsurgical changes are noted lower lumbar spine.   IMPRESSION: 1. There is inflammatory process in left abdomen with short segment thickening of small bowel wall and significant stranding of adjacent mesenteric fat. Best seen in axial image 60. Findings consistent with segmental enteritis or focal acute small bowel diverticulitis. Less likely neoplastic or ischemic process. Clinical correlation is necessary. Correlation with gastrointestinal exam is recommended. 2. No mesenteric abscess.  No evidence of free abdominal air. 3. No pericecal inflammation. 4. Status post hysterectomy.     Electronically Signed   By: Lahoma Crocker M.D.   On: 12/30/2015 14:33    ASSESSMENT/PLAN:  65 year old female with a history of adenomatous colon polyps, family history of colon cancer in her brother, recent diagnosis of small bowel diverticulitis treated with antibiotics, GERD, hypertension, hyperlipidemia, obesity and diabetes who is seen in consultation at the request of Dr. Mayra Neer to follow-up small bowel diverticulitis  1. Small bowel  diverticulitis/abnl CT small bowel -- most likely to focal enteritis responding to antibiotics represent small bowel diverticulitis. I have recommended direct visualization with video capsule endoscopy to exclude ongoing small bowel pathology such as Crohn's disease, polyp or malignancy. We discussed the risks, benefits and alternatives to capsule endoscopy including the possibility of small bowel obstruction which would lead to a surgery. After this discussion she wishes to proceed. This study will be ordered for her. I've asked that she notify me should she have recurrent abdominal pain similar to her episode in August. She voiced understanding  2. GERD -- some afternoon breakthrough heartburn recently despite pantoprazole 40 mg daily. We discussed GERD diet. I advised she begin ranitidine 150 mg in the afternoon or evening on an as-needed basis. Notify me if this fails to improve breakthrough heartburn.   [id number] [place], Md [address] [place], [place] [id number]

## 2016-03-26 NOTE — Patient Instructions (Addendum)
Continue pantoprazole.  We have sent the following medications to your pharmacy for you to pick up at your convenience: Ranitidine 150 mg every evening as needed.  You will be due for a recall colonoscopy in 09/2018. We will send you a reminder in the mail when it gets closer to that time.  CAPSULE ENDOSCOPY PATIENT INSTRUCTION SHEET  [name] August 19, 1950 [id number]   1. 03/30/16 Seven (7) days prior to capsule endoscopy stop taking iron supplements and carafate.  2. 04/03/16 Two (2) days prior to capsule endoscopy stop taking aspirin or any arthritis drugs.  3. 04/05/16 Day before capsule endoscopy purchase a 238 gram bottle of Miralax from the laxative section of your drug store, and a 32 oz. bottle of Gatorade (no red).    4. 04/05/16 One (1) day prior to capsule endoscopy: a) Stop smoking. b) Eat a regular diet until 12:00 Noon. c) After 12:00 Noon take only the following: Black coffee  Jell-O (no fruit or red Jell-o) Water   Bouillon (chicken or beef) 7-Up   Cranberry Juice Tea   Kool-Aid Popsicle (not red) Sprite   Coke Ginger Ale  Pepsi Mountain Dew Gatorade d) At 6:00 pm the evening before your appointment, drink 7 capfuls (105 grams) of Miralax with 32 oz. Gatorade. Drink 8 oz every 15 minutes until gone. e) Nothing to eat or drink after midnight except medications with a sip of water.  5. 04/06/16 Day of capsule endoscopy: Wear loose two-piece clothing to your exam. No medications for 2 hours prior to your test.  6. Please arrive at [facility]  3rd floor patient registration area by 8:00 am on: 04/06/16.   For any questions: Call [place] at [phone] and ask to speak with one of the capsule endoscopy nurses.  If you are age 15 or older, your body mass index should be between 23-30. Your Body mass index is 36.86 kg/m. If this is out of the aforementioned range listed, please consider follow up with your Primary Care Provider.  If you are age  44 or younger, your body mass index should be between 19-25. Your Body mass index is 36.86 kg/m. If this is out of the aformentioned range listed, please consider follow up with your Primary Care Provider.

## 2016-04-04 ENCOUNTER — Telehealth: Payer: Self-pay | Admitting: *Deleted

## 2016-04-04 NOTE — Telephone Encounter (Signed)
-----   Message from Darden Dates sent at 04/04/2016  2:57 PM EST ----- Regarding: Capsule Please cancel capsule.  Was denied by insurance.  Emailed you denial to pass to Dr. Hilarie Fredrickson.  Maybe he can call to get them to approve and she can be r/s for another day. Thanks, Amy

## 2016-04-04 NOTE — Telephone Encounter (Signed)
I have left a voicemail for patient to call back. Denial has been placed on Dr Vena Rua desk for review so he can appeal if he would like.

## 2016-04-05 NOTE — Telephone Encounter (Signed)
Patient has been advised that insurance initially denied her small bowel capsule endoscopy. However, Dr Hilarie Fredrickson has gotten this procedure approved since. We will need to reschedule procedure still though until we get the faxed approval. Patient verbalizes understanding and has changed capsule to Monday, 04/09/16.

## 2016-04-05 NOTE — Telephone Encounter (Signed)
Patient returned phone call. Best # [phone]

## 2016-04-05 NOTE — Telephone Encounter (Signed)
#  [id number]   Pt is returning your call

## 2016-04-05 NOTE — Telephone Encounter (Signed)
Left another voicemail for patient to call back.

## 2016-04-09 ENCOUNTER — Encounter: Payer: Self-pay | Admitting: Internal Medicine

## 2016-04-09 ENCOUNTER — Ambulatory Visit (INDEPENDENT_AMBULATORY_CARE_PROVIDER_SITE_OTHER): Payer: BC Managed Care – PPO | Admitting: Internal Medicine

## 2016-04-09 DIAGNOSIS — K5700 Diverticulitis of small intestine with perforation and abscess without bleeding: Secondary | ICD-10-CM | POA: Diagnosis not present

## 2016-04-09 NOTE — Progress Notes (Signed)
Patient here for capsule endoscopy. Tolerated procedure. Verbalizes understanding of written and verbal instructions. Lot # [id number]  Capsule ID [id number] exp 06/13/2017

## 2016-04-12 ENCOUNTER — Ambulatory Visit: Payer: BC Managed Care – PPO | Admitting: Internal Medicine

## 2016-04-17 ENCOUNTER — Encounter: Payer: Self-pay | Admitting: Internal Medicine

## 2016-04-19 ENCOUNTER — Encounter: Payer: BC Managed Care – PPO | Admitting: Dietician

## 2016-04-23 ENCOUNTER — Encounter: Payer: Self-pay | Admitting: Internal Medicine

## 2016-04-25 ENCOUNTER — Other Ambulatory Visit: Payer: Self-pay

## 2016-04-25 DIAGNOSIS — R109 Unspecified abdominal pain: Secondary | ICD-10-CM

## 2016-04-25 NOTE — Telephone Encounter (Signed)
VCE read, recommendations given to my RN, Vaughan Basta She will contact the patient

## 2016-04-26 ENCOUNTER — Encounter: Payer: Self-pay | Admitting: Internal Medicine

## 2016-04-26 ENCOUNTER — Other Ambulatory Visit: Payer: Self-pay

## 2016-04-26 DIAGNOSIS — R109 Unspecified abdominal pain: Secondary | ICD-10-CM

## 2016-04-26 MED ORDER — DIPHENHYDRAMINE HCL 50 MG PO TABS: ORAL_TABLET | ORAL | 0 refills | Status: DC

## 2016-04-26 MED ORDER — PREDNISONE 50 MG PO TABS
ORAL_TABLET | ORAL | 0 refills | Status: DC
Start: 2016-04-26 — End: 2016-08-07

## 2016-04-27 ENCOUNTER — Other Ambulatory Visit (INDEPENDENT_AMBULATORY_CARE_PROVIDER_SITE_OTHER): Payer: BC Managed Care – PPO

## 2016-04-27 ENCOUNTER — Telehealth: Payer: Self-pay | Admitting: Internal Medicine

## 2016-04-27 DIAGNOSIS — R109 Unspecified abdominal pain: Secondary | ICD-10-CM | POA: Diagnosis not present

## 2016-04-27 LAB — CREATININE, SERUM: Creatinine, Ser: 1.56 mg/dL — ABNORMAL HIGH (ref 0.40–1.20)

## 2016-04-27 LAB — BUN: BUN: 33 mg/dL — ABNORMAL HIGH (ref 6–23)

## 2016-04-27 NOTE — Telephone Encounter (Signed)
Pt to have CT-enterography for hx of small bowel diverticulitis Was to have steroid prep for contrast allergy She is worried about blood sugar control with steroids associated with prep for CT along with gastritis associated with steroids Please ask radiology if we can get adequate information on the small bowel without IV contrast. For enterography she will get oral contrast, but this is not as issue for her If IV contrast is needed we can discuss further options Another possibility if MR-enterography Thank you

## 2016-04-27 NOTE — Telephone Encounter (Signed)
Stacy from CT returning Linda's call

## 2016-05-01 ENCOUNTER — Inpatient Hospital Stay: Admission: RE | Admit: 2016-05-01 | Payer: BC Managed Care – PPO | Source: Ambulatory Visit

## 2016-05-01 ENCOUNTER — Other Ambulatory Visit: Payer: Self-pay

## 2016-05-01 ENCOUNTER — Telehealth: Payer: Self-pay | Admitting: Internal Medicine

## 2016-05-01 DIAGNOSIS — R1030 Lower abdominal pain, unspecified: Secondary | ICD-10-CM

## 2016-05-01 DIAGNOSIS — R935 Abnormal findings on diagnostic imaging of other abdominal regions, including retroperitoneum: Secondary | ICD-10-CM

## 2016-05-01 NOTE — Telephone Encounter (Signed)
I have spoken with the patient. She agrees to go this Friday to [place] [facility] . Arrive at 7:30 pm. Fast for 4 hours prior.  Message to Dr Hilarie Fredrickson asking for Ativan for anxiety and to clarify if she also needs the pelvis included. Patietn uses [facility]. She understands I will call her with complete instructions.

## 2016-05-02 NOTE — Telephone Encounter (Signed)
Patient is aware. Instructed to fast for 4 hours prior. She states she can see her appointment time on the My Chart app as 9:00 pm.

## 2016-05-02 NOTE — Telephone Encounter (Signed)
Per Dr Hilarie Fredrickson -Yes she can have ativan pre-imaging  MR enterography (I need entire small bowel imaged). May need to ask radiology, and include pelvis is they recommend doing so  Thanks  JMP   MRI order updated and linked to the MRI enteroscopy abdomen order.

## 2016-05-03 ENCOUNTER — Other Ambulatory Visit: Payer: Self-pay

## 2016-05-03 DIAGNOSIS — R1030 Lower abdominal pain, unspecified: Secondary | ICD-10-CM

## 2016-05-03 DIAGNOSIS — R935 Abnormal findings on diagnostic imaging of other abdominal regions, including retroperitoneum: Secondary | ICD-10-CM

## 2016-05-04 ENCOUNTER — Ambulatory Visit (HOSPITAL_COMMUNITY): Payer: BC Managed Care – PPO

## 2016-05-04 ENCOUNTER — Telehealth: Payer: Self-pay | Admitting: Internal Medicine

## 2016-05-08 NOTE — Telephone Encounter (Signed)
Yes, I do recommend the cross-sectional imaging given her history of probable small bowel diverticulitis and incomplete VCE. I think with the ativan, she will do okay from the claustrophobia/anxiety standpoint

## 2016-05-08 NOTE — Telephone Encounter (Signed)
I have arranged to have her MRI enterosocpy of the abdomen and pelvis done at [facility] with the open scanner. The tech there says this can be done. The patient is advised of the need to go forward with the scan and she agrees to this plan of care. She agrees to call [facility] and set the appointment. Order is already in.

## 2016-05-08 NOTE — Telephone Encounter (Signed)
I have spoken several times with the patient about getting her MRI enterography of the abd/pelvis done. Originally the plan was to have this done 05/04/16 at [facility]. She was to use her Ativan 1 mg 1 hour prior to the scan for her claustrophobia. She then decided she couldn't do this and became very anxious. The MRI enterography was then moved per her request to [facility] with the understanding there was an open MRI. [facility] has informed the patient this scan has to be done in an enclosed scanner. The patient now questions if this needs to be done at all. Please advise.

## 2016-05-08 NOTE — Telephone Encounter (Signed)
Patient instructed to take Ativan 2 mg PO 45 minutes before MRI per Dr Hilarie Fredrickson. She understands to have a driver.

## 2016-05-10 ENCOUNTER — Ambulatory Visit (INDEPENDENT_AMBULATORY_CARE_PROVIDER_SITE_OTHER): Payer: BC Managed Care – PPO | Admitting: Internal Medicine

## 2016-05-10 ENCOUNTER — Encounter: Payer: BC Managed Care – PPO | Attending: Internal Medicine | Admitting: Dietician

## 2016-05-10 ENCOUNTER — Encounter: Payer: Self-pay | Admitting: Internal Medicine

## 2016-05-10 ENCOUNTER — Encounter: Payer: Self-pay | Admitting: Dietician

## 2016-05-10 VITALS — BP 122/74 | HR 80 | Ht 64.0 in | Wt 209.0 lb

## 2016-05-10 DIAGNOSIS — E1142 Type 2 diabetes mellitus with diabetic polyneuropathy: Secondary | ICD-10-CM

## 2016-05-10 DIAGNOSIS — B373 Candidiasis of vulva and vagina: Secondary | ICD-10-CM | POA: Diagnosis not present

## 2016-05-10 DIAGNOSIS — B3731 Acute candidiasis of vulva and vagina: Secondary | ICD-10-CM

## 2016-05-10 DIAGNOSIS — IMO0002 Reserved for concepts with insufficient information to code with codable children: Secondary | ICD-10-CM

## 2016-05-10 DIAGNOSIS — Z713 Dietary counseling and surveillance: Secondary | ICD-10-CM | POA: Diagnosis not present

## 2016-05-10 DIAGNOSIS — E1165 Type 2 diabetes mellitus with hyperglycemia: Secondary | ICD-10-CM | POA: Diagnosis not present

## 2016-05-10 HISTORY — DX: Reserved for concepts with insufficient information to code with codable children: IMO0002

## 2016-05-10 LAB — BASIC METABOLIC PANEL WITH GFR
BUN: 32 mg/dL — AB (ref 7–25)
CO2: 20 mmol/L (ref 20–31)
Calcium: 9.1 mg/dL (ref 8.6–10.4)
Chloride: 105 mmol/L (ref 98–110)
Creat: 1.32 mg/dL — ABNORMAL HIGH (ref 0.50–0.99)
GFR, EST AFRICAN AMERICAN: 50 mL/min — AB (ref >=60)
GFR, Est Non African American: 43 mL/min — ABNORMAL LOW (ref >=60)
Glucose, Bld: 137 mg/dL — ABNORMAL HIGH (ref 65–99)
Potassium: 5 mmol/L (ref 3.5–5.3)
Sodium: 138 mmol/L (ref 135–146)

## 2016-05-10 LAB — POCT GLYCOSYLATED HEMOGLOBIN (HGB A1C): HEMOGLOBIN A1C: 6.8

## 2016-05-10 MED ORDER — FLUCONAZOLE 150 MG PO TABS: 150.0000 mg | ORAL_TABLET | Freq: Every day | ORAL | 1 refills | Status: DC

## 2016-05-10 NOTE — Progress Notes (Signed)
Patient ID: [name], female   DOB: Aug 05, 1950, 66 y.o.   MRN: [id number]   HPI: [name] is a 66 y.o.-year-old female, initially referred by her PCP, Dr. Harley Alto for f/u for DM2, dx in 1980s, non-insulin-dependent, uncontrolled, with complications (PN - s/p B digit amputations 2/2 osteomyelitis). She saw Dr. Elyse Hsu previously. Last visit with me 3 mo ago.  Last hemoglobin A1c was: 02/16/2016: 7.1% 06/2015: 6.6% Lab Results  Component Value Date   HGBA1C 7.3 (H) 02/10/2012   Pt is on a regimen of: - Metformin ER 1000 mg 2x a day, with meals - Jardiance 25 mg in am - Trulicity 1.5 mg weekly - restarted 02/2016 >> tolerates this well She was on Victoza >> rash.  Pt checks her sugars 1-2x a day and they are: - am: [id number], [id number] (on Trulicity [id number]) >> [id number], 153 - 2h after b'fast: n/c >> 125-140 - before lunch: n/c >> 123 - 2h after lunch: 150s >> 140 - before dinner: n/c >> 140 - 2h after dinner: n/c >> 137-155 - bedtime: 150-170 >> 111, 140 - nighttime: n/c No lows. Lowest sugar was 119 >> 111; she has hypoglycemia awareness at 99.  Highest sugar was 300s >> 155.  Glucometer: One Touch Verio  Pt's meals are: - Breakfast: [place] bacon, 2 eggs, toast; oatmeal; or brown rice - Lunch: Tuna, crackers or sandwich, chicken strips, chips, diet Pepsi - Dinner: Meat, veggies, starch  - Snacks: 2-3: 15g carbs (pretzels, chips)  For exercise, she walks or rides stationary bike 4 times a week.  - no CKD, last BUN/creatinine:  Lab Results  Component Value Date   BUN 33 (H) 04/27/2016   BUN 25 (H) 01/07/2014   CREATININE 1.56 (H) 04/27/2016   CREATININE 1.1 01/07/2014  02/16/2016: 42/1.34 (GFR 40), Glu 213  12/26/2015: 25/1.23 (GFR 44), Glu 153 On lisinopril. - last set of lipids: 02/16/2016: 128/278/33/39  03/02/2014: 114/175/32/48  On Zocor. - last eye exam was in 09/2015 (Dr. Gershon Crane). No DR.  - + numbness and tingling in her feet.  She also  has a history of HTN, HL, reflux, anemia.  ROS: Constitutional: no weight gain/loss,+  fatigue, no subjective hyperthermia/hypothermia Eyes: no blurry vision, no xerophthalmia ENT: no sore throat, no nodules palpated in throat, no dysphagia/odynophagia, no hoarseness, + occasional tinnitus  Cardiovascular: no CP/SOB/palpitations/leg swelling Respiratory: no cough/no SOB Gastrointestinal: no N/V/D/+ heartburn/+ AP - sees Dr. Hilarie Fredrickson Musculoskeletal:no mm/joint aches Skin: no Rash Neurological: no tremors/numbness/tingling/dizziness  I reviewed pt's medications, allergies, PMH, social hx, family hx, and changes were documented in the history of present illness. Otherwise, unchanged from my initial visit note.  Past Medical History:  Diagnosis Date  . Anemia in chronic renal disease 06/05/2011  . Anxiety    takes Ativan tid  . Asthma    has an inhaler prn  . Cough   . DDD (degenerative disc disease), cervical 06/05/2011  . DDD (degenerative disc disease), lumbosacral 06/05/2011  . Depression    takes Cymbalta daily  . Diabetes mellitus    takes Amaryl and Metformin daily  . Diabetic neuropathy ([place])   . Diverticulosis   . Dizziness   . GERD (gastroesophageal reflux disease)    takes Protonix prn  . Headache(784.0)    occasionally  . History of bronchitis    last time 35yrs ago  . History of colon polyps   . History of MRSA infection   . HTN (hypertension) 06/05/2011   takes Prinizide daily  .  Hyperlipidemia    takes Zocor daily  . Insomnia    not on any meds at present time  . Osteoarthritis   . PONV (postoperative nausea and vomiting)    hard to wake up  . Psoriasis 06/05/2011   on legs and uses cream prn  . Seasonal allergies    but doesn't take any meds  . Sleep apnea    uses CPAP  . Tubular adenoma of colon    Past Surgical History:  Procedure Laterality Date  . ABDOMINAL HYSTERECTOMY  1982 and 1984  . AMPUTATION  02/12/2012   Procedure: AMPUTATION RAY;  Surgeon:  Wylene Simmer, MD;  Location: [facility];  Service: Orthopedics;  Laterality: Right;  RIGHT 2ND TOE AMPUTATION   . BACK SURGERY     x 3  . CARDIAC CATHETERIZATION     08/09/04: 50% mid AV groove CX lesion, NL LM, LAD, Ramus, EF > 60% (Dr. Gwenlyn Found)  . CARPAL TUNNEL RELEASE Bilateral 2010  . CERVICAL SPINE SURGERY    . CHOLECYSTECTOMY  1977  . COLONOSCOPY    . DILATION AND CURETTAGE OF UTERUS    . ELBOW SURGERY     left d/t tendonitis  . ESOPHAGOGASTRODUODENOSCOPY    . LUMBAR WOUND DEBRIDEMENT N/A 07/15/2012   Procedure: I & D of Lumbar Wound: Possible Repair of CSF Leak;  Surgeon: Elaina Hoops, MD;  Location: [facility];  Service: Neurosurgery;  Laterality: N/A;  I & D of Lumbar Wound: Possible Repair of CSF Leak placement lumbar drain  . right knee arthroscopy     x 2  . SHOULDER ARTHROSCOPY Left   . TOE AMPUTATION Left    Social History   Social History  . Marital status: Married    Spouse name: N/A  . Number of children: 2   Occupational History  . Disabled    Social History Main Topics  . Smoking status: Never Smoker  . Smokeless tobacco: Never Used     Comment: never used tobacco  . Alcohol use No  . Drug use: No   Current Outpatient Prescriptions on File Prior to Visit  Medication Sig Dispense Refill  . albuterol (PROVENTIL HFA;VENTOLIN HFA) 108 (90 BASE) MCG/ACT inhaler Inhale 1 puff into the lungs as needed for shortness of breath.     Marland Kitchen aspirin 81 MG tablet Take 81 mg by mouth daily.    . clobetasol (OLUX) 0.05 % topical foam Apply 1 application topically daily as needed (apply to head for dry scalp).     . COLLAGEN-VITAMIN C PO Take by mouth daily.    . cyclobenzaprine (FLEXERIL) 10 MG tablet Take 10 mg by mouth as needed for muscle spasms.    . diphenhydrAMINE (BENADRYL) 50 MG tablet Take as directed 1 tablet 0  . DULoxetine (CYMBALTA) 30 MG capsule Take 90 mg by mouth daily.  5  . DULoxetine (CYMBALTA) 60 MG capsule Take 90 mg by mouth daily.     Marland Kitchen JARDIANCE 25 MG TABS  tablet Take every day. 30 tablet 3  . lisinopril-hydrochlorothiazide (PRINZIDE,ZESTORETIC) 20-25 MG per tablet Take 1 tablet by mouth daily.    Marland Kitchen LORazepam (ATIVAN) 1 MG tablet Take 1 mg by mouth daily as needed.     . metFORMIN (GLUCOPHAGE) 1000 MG tablet Take 1 tablet (1,000 mg total) by mouth 2 (two) times daily with a meal. 180 tablet 1  . metoprolol succinate (TOPROL-XL) 25 MG 24 hr tablet     . Multiple Vitamin (MULTI-VITAMIN DAILY  PO) Take 1 tablet by mouth daily.     . NON FORMULARY Phazyme 1 daily    . ondansetron (ZOFRAN) 8 MG tablet TAKE 1 TABLET BY MOUTH 3 TIMES A DAY AS NEEDED NAUSEA (INS PAYS #18/21 DAYS)  0  . ONETOUCH VERIO test strip USE TO TEST BLOOD SUGARS 2 times daily (DX: E11.65) 100 each 11  . pantoprazole (PROTONIX) 40 MG tablet Take 40 mg by mouth daily as needed (for reflux). For acid reflux    . predniSONE (DELTASONE) 50 MG tablet Take as directed 3 tablet 0  . Probiotic Product (PROBIOTIC DAILY PO) Take by mouth. Probiotic plus daily    . ranitidine (ZANTAC) 150 MG tablet Take 1 tablet (150 mg total) by mouth every evening. As needed. 30 tablet 2  . simvastatin (ZOCOR) 40 MG tablet Take 40 mg by mouth at bedtime.     . traMADol (ULTRAM) 50 MG tablet TAKE 1 OR 2 TABLETS BY MOUTH EVERY 6 TO 8 HOURS AS NEEDED FOR PAIN  0  . TRULICITY 1.5 0000000 SOPN INJECT ONCE A WEEK 4 pen 5   No current facility-administered medications on file prior to visit.    Allergies  Allergen Reactions  . Victoza [Liraglutide]     Severe rash and hives.  . Dilaudid [Hydromorphone]     Rash and swelling  . Ultram [Tramadol] Rash  . Betadine [Povidone Iodine] Other (See Comments)    Skin peels  . Bydureon [Exenatide] Diarrhea and Nausea And Vomiting  . Codeine     Severe headaches  . Contrast Media [Iodinated Diagnostic Agents] Nausea And Vomiting  . Hydrocodone Other (See Comments)    headache  . Iodine Hives    Iodine hives  . Januvia [Sitagliptin] Diarrhea  . Morphine And  Related Nausea And Vomiting  . Percocet [Oxycodone-Acetaminophen] Other (See Comments)    Headache   . Septra [Sulfamethoxazole-Trimethoprim]     rash  . Sulfa Antibiotics Itching  . Valium Other (See Comments)    headache  . Vicodin [Hydrocodone-Acetaminophen] Other (See Comments)    headache  . Amoxicillin Hives and Rash  . Darvocet [Propoxyphene N-Acetaminophen] Rash and Other (See Comments)    headache  . Fentanyl And Related Rash  . Wellbutrin [Bupropion Hcl] Rash   Family History  Problem Relation Age of Onset  . Colon cancer Brother 40  . Hypertension Mother     died from infection s/p hip surgery  . Heart attack Father   . Colon cancer Maternal Grandfather   . Stomach cancer Neg Hx   . Esophageal cancer Neg Hx   . Rectal cancer Neg Hx   . Liver cancer Neg Hx     PE: BP 122/74 (BP Location: Left Arm, Patient Position: Sitting)   Pulse 80   Ht 5\' 4"  (1.626 m)   Wt 209 lb (94.8 kg)   SpO2 97%   BMI 35.87 kg/m  Wt Readings from Last 3 Encounters:  05/10/16 209 lb (94.8 kg)  03/26/16 211 lb 6 oz (95.9 kg)  02/20/16 211 lb (95.7 kg)   Constitutional: overweight, in NAD Eyes: anisocoria - R pupil larger and less reactive to light, EOMI, no exophthalmos ENT: moist mucous membranes, no thyromegaly, no cervical lymphadenopathy; ant. neck scar 2/2 cervical spine surgery Cardiovascular: RRR, No MRG Respiratory: CTA B Gastrointestinal: abdomen soft, NT, ND, BS+ Musculoskeletal: no deformities, strength intact in all 4 Skin: moist, warm, no rashes Neurological: no tremor with outstretched hands, DTR normal in all 4  ASSESSMENT: 1. DM2, non-insulin-dependent, uncontrolled, with complications: - PN - s/p B 2nd toe amputations 2/2 osteomyelitis - 2013 and 2015  2. Vaginal yeast infection  PLAN:  1. Patient with long-standing, uncontrolled diabetes, on oral antidiabetic regimen + Added Trulicity at last visit, since she had good results with this in the past. She was  taken off Trulicity during the summer due to diverticulitis, however, she restarted it and tolerates it very well. Also, her sugars are better. She does have abdominal pain, for which she continues to see Dr. Elmo Putt - Latest HbA1c was 7.1% >> today, HbA1c much better: 6.8%  - at last visit, I also referred her to nutrition >> did not go yet.  - I suggested to:  Patient Instructions  Please continue: - Metformin ER 1000 mg 2x a day, with meals - Jardiance 25 mg in am - Trulicity 1.5 mg weekly   Please stop at the lab.  Please come back for a follow-up appointment in 3 months.  -continue to check sugars at different times of the day - check 1-2 times a day, rotating checks - advised for yearly eye exams >> she is UTD - UTD with flu shot - Her last kidney tests were worse as checked by Dr. Hilarie Fredrickson. I reviewed these with the patient, and we decided to recheck the tests to see if we need to decrease the doses of metformin or Jardiance. - Return to clinic in 3 mo with sugar log  2. Yeast vaginitis - She has a vaginal is infection with Jardiance >> will send a prescription to Diflucan to her pharmacy   Component     Latest Ref Rng & Units 04/27/2016 05/10/2016  Sodium     135 - 146 mmol/L  138  Potassium     3.5 - 5.3 mmol/L  5.0  Chloride     98 - 110 mmol/L  105  CO2     20 - 31 mmol/L  20  Glucose     65 - 99 mg/dL  137 (H)  BUN     7 - 25 mg/dL 33 (H) 32 (H)  Creatinine     0.50 - 0.99 mg/dL 1.56 (H) 1.32 (H)  Calcium     8.6 - 10.4 mg/dL  9.1  GFR, Est African American     >=60 mL/min  50 (L)  GFR, Est Non African American     >=60 mL/min  43 (L)  Hemoglobin A1C       6.8   Kidney fxn slightly better. Will advise her to stay well hydrated and may need to repeat at next visit. For now, I would continue the current dose of metformin but cut the Jardiance in half.  Philemon Kingdom, MD PhD [facility] Endocrinology

## 2016-05-10 NOTE — Progress Notes (Signed)
Medical Nutrition Therapy:  Appt start time: 0935 end time:  [id number].   Assessment:  Primary concerns today: Patient is here today with her husband.  She would like to learn "good foods and bad foods".  She has had type 2 diabetes since the 1980's.  Her husband also has diabetes.  Both have lost weight in the past year.  Weight today 209 lbs decreased from 234 lbs last year.  A1C decreased from 7.1% 02/16/16 to 6.8% today. She checks her blood sugar 1-2 times daily with improvements in the past 3 months (am 137-153 and 2 hours after random meals 123-140). Other hx includes:  HTN, hyperlipidemia, CKD neuropathy, depression, asthma, back problems.   BUN 36, Creatinine 1.56 04/27/16.  Patient lives with her husband.  She does the shopping and cooking.  She is not employeed.   Preferred Learning Style:   No preference indicated   Learning Readiness:   Ready  Change in progress  MEDICATIONS: see list to include Jardiance, Metformin, and Trulicity   DIETARY INTAKE:  Usual eating pattern includes 3 meals and 2-3 snacks per day.  24-hr recall:  B (7:30-8:30 AM): coffee, gingersnaps or butter cookie, egg, bacon OR mini sausage biscuit or cereal (multigrain cheerios or frosted flakes) and 2% milk with stevia.  Snk ( AM): fruit (fresh or canned) or chips  L (12-2 PM): fish sandwich or chik fil-A or hotdog AND french fries if out OR tuna or chicken with ritz or saltine crackers OR egg salad sandwich OR soup Snk ( PM): occasional similar to the am D (4:30-6:30 PM): chicken with starch and vegetable OR hamburger OR spaghetti OR sauerkraut, hot dog and corn bread OR occasional steak, potato, vegetable Snk ( PM): occasional sugar free jello or sugar free pudding or canned fruit or sherbet Beverages: coffee with stevia and creamer, diet pepsi, diet dr. Malachi Bonds, water, flavored ICE drink, decaf tea (sweetened) occasionally, 2% milk, lite cranberry juice  Usual physical activity: walking when out,  "litle that I can do because of my back and knee".  Has a stationary bike but will try to resume this but is concerned about pain.  Pain worse when walking on cement.  Estimated energy needs: 1500 calories 187 g carbohydrates 75 g protein 50 g fat  Progress Towards Goal(s):  In progress.   Nutritional Diagnosis:  NB-1.1 Food and nutrition-related knowledge deficit As related to balance of carbohydrate, protein, and fat.  As evidenced by diet hx and patient report.    Intervention:  Nutrition counseling and diabetes education initiated. Discussed Carb Counting by food group as method of portion control, reading food labels, and benefits of increased activity. Also discussed basic physiology of Diabetes, target BG ranges pre and post meals, and A1c.   Continue a regular schedule for meals. Rethink what you drink.  Avoid dark soda.  Choose beverages without carbohydrates most often. Stay as active as possible. Decrease fat.  Bake, broil, grill most often. Make mindful choices. When eating out, consider take out box, smaller portions or sharing.  Aim for 2-3 Carb Choices per meal (30-45 grams) +/- 1 either way  Aim for 0-1 Carbs per snack if hungry  Include protein in moderation with your meals and snacks Consider reading food labels for Total Carbohydrate and Fat Grams of foods Consider  increasing your activity level by walking or biking for 30 minutes daily as tolerated. Consider checking BG at alternate times per day as directed by MD  Consider taking medication as directed  by MD  Teaching Method Utilized:  Visual Auditory Hands on  Handouts given during visit include:  Dining out with diabetes  Making healthy fast food choices  Snack list  Label reading  Meal plan card  My plate  Barriers to learning/adherence to lifestyle change: back and knee pain  Demonstrated degree of understanding via:  Teach Back   Monitoring/Evaluation:  Dietary intake, exercise, label  reading, and body weight prn.

## 2016-05-10 NOTE — Patient Instructions (Signed)
Continue a regular schedule for meals. Rethink what you drink.  Avoid dark soda.  Choose beverages without carbohydrates most often. Stay as active as possible. Decrease fat.  Bake, broil, grill most often. Make mindful choices. When eating out, consider take out box, smaller portions or sharing.  Aim for 2-3 Carb Choices per meal (30-45 grams) +/- 1 either way  Aim for 0-1 Carbs per snack if hungry  Include protein in moderation with your meals and snacks Consider reading food labels for Total Carbohydrate and Fat Grams of foods Consider  increasing your activity level by walking or biking for 30 minutes daily as tolerated. Consider checking BG at alternate times per day as directed by MD  Consider taking medication as directed by MD

## 2016-05-10 NOTE — Patient Instructions (Addendum)
Please continue: - Metformin ER 1000 mg 2x a day, with meals - Jardiance 25 mg in am - Trulicity 1.5 mg weekly   Please stop at the lab.  Please come back for a follow-up appointment in 3 months.

## 2016-05-11 MED ORDER — JARDIANCE 25 MG PO TABS: ORAL_TABLET | ORAL | 3 refills | Status: DC

## 2016-05-18 ENCOUNTER — Ambulatory Visit
Admission: RE | Admit: 2016-05-18 | Discharge: 2016-05-18 | Disposition: A | Discharge status: Home / Self Care | Payer: BC Managed Care – PPO | Source: Ambulatory Visit | Attending: Internal Medicine | Admitting: Internal Medicine

## 2016-05-18 ENCOUNTER — Other Ambulatory Visit: Payer: Self-pay | Admitting: Internal Medicine

## 2016-05-18 DIAGNOSIS — R935 Abnormal findings on diagnostic imaging of other abdominal regions, including retroperitoneum: Secondary | ICD-10-CM

## 2016-05-18 DIAGNOSIS — R1030 Lower abdominal pain, unspecified: Secondary | ICD-10-CM

## 2016-05-18 MED ORDER — GADOBENATE DIMEGLUMINE 529 MG/ML IV SOLN
8.0000 mL | Freq: Once | INTRAVENOUS | Status: AC | PRN
  Administered 2016-05-18: 8 mL via INTRAVENOUS

## 2016-05-25 ENCOUNTER — Encounter: Payer: Self-pay | Admitting: Internal Medicine

## 2016-05-28 NOTE — Telephone Encounter (Signed)
I called patient back to answer questions and go over the MRI results Jejunal diverticulosis She is feeling better overall Some intermittent abd cramping, but no diverticulitis like pain Loose stools which she thinks relates to diabetes meds.  Nonbloody  Please rx bentyl 20 mg BID-TID Can use imodium per box instruction Keep followup visit with me as scheduled Call or email with further questions, concerns, problems prior to OV

## 2016-05-30 MED ORDER — DICYCLOMINE HCL 20 MG PO TABS: 20.0000 mg | ORAL_TABLET | Freq: Three times a day (TID) | ORAL | 1 refills | Status: DC

## 2016-08-07 ENCOUNTER — Ambulatory Visit (INDEPENDENT_AMBULATORY_CARE_PROVIDER_SITE_OTHER): Payer: BC Managed Care – PPO | Admitting: Internal Medicine

## 2016-08-07 ENCOUNTER — Encounter: Payer: Self-pay | Admitting: Internal Medicine

## 2016-08-07 VITALS — BP 132/78 | HR 76 | Ht 63.0 in | Wt 213.0 lb

## 2016-08-07 DIAGNOSIS — E1142 Type 2 diabetes mellitus with diabetic polyneuropathy: Secondary | ICD-10-CM | POA: Diagnosis not present

## 2016-08-07 DIAGNOSIS — IMO0002 Reserved for concepts with insufficient information to code with codable children: Secondary | ICD-10-CM

## 2016-08-07 DIAGNOSIS — E1165 Type 2 diabetes mellitus with hyperglycemia: Secondary | ICD-10-CM | POA: Diagnosis not present

## 2016-08-07 LAB — POCT GLYCOSYLATED HEMOGLOBIN (HGB A1C): Hemoglobin A1C: 6.8

## 2016-08-07 NOTE — Addendum Note (Signed)
Addended by: Caprice Beaver T on: 08/07/2016 01:41 PM   Modules accepted: Orders

## 2016-08-07 NOTE — Patient Instructions (Addendum)
Patient Instructions  Please continue: - Metformin ER 1000 mg 2x a day, with meals - Jardiance 1/2 of a 25 mg tablet in am - Trulicity 1.5 mg weekly   Please read: - Program for Reversing Diabetes by DR. Alyssa Grove - Eat to Live by Dr. Excell Seltzer - How not to die by Dr. Alden Benjamin  Please come back for a follow-up appointment in 3 months.

## 2016-08-07 NOTE — Progress Notes (Signed)
Patient ID: [name], female   DOB: 1951/01/29, 66 y.o.   MRN: [id number]   HPI: [name] is a 66 y.o.-year-old female, initially referred by her PCP, Dr. Harley Alto for f/u for DM2, dx in 1980s, non-insulin-dependent, uncontrolled, with complications (PN - s/p B digit amputations 2/2 osteomyelitis). She saw Dr. Elyse Hsu previously. Last visit with me 3 mo ago.  Last hemoglobin A1c was: Lab Results  Component Value Date   HGBA1C 6.8 05/10/2016   HGBA1C 7.3 (H) 02/10/2012  02/16/2016: 7.1% 06/2015: 6.6%  Pt is on a regimen of: - Metformin ER 1000 mg 2x a day, with meals - Jardiance 25 mg in am >> 12.5 mg 2/2 CKD (decreased 22/9798) - Trulicity 1.5 mg weekly - restarted 02/2016 >> tolerates this well She was on Victoza >> rash.  Pt checks her sugars 1-2x a day and they are: - am: 921-194R, 740CX4 (on Trulicity 481-856D) >> 149-702, 153 >> 110-138, 142 - 2h after b'fast: n/c >> 125-140 >> 117-160 - before lunch: n/c >> 123 >> 130-140 - 2h after lunch: 150s >> 140 >> 160 - before dinner: n/c >> 140 >> 134-188, 204 - 2h after dinner: n/c >> 137-155 >> 137-144,157 - bedtime: 150-170 >> 111, 140 >> 119, 120, 168 - nighttime: n/c No lows. Lowest sugar was 119 >> 111 >> 110; she has hypoglycemia awareness at 99.  Highest sugar was 300s >> 155 >>204.  Glucometer: One Touch Verio  Pt's meals are: - Breakfast: [place] bacon, 2 eggs, toast; oatmeal; or brown rice - Lunch: Tuna, crackers or sandwich, chicken strips, chips, diet Pepsi - Dinner: Meat, veggies, starch  - Snacks: 2-3: 15g carbs (pretzels, chips)  For exercise, she walks or rides stationary bike 4 times a week.  - no CKD, last BUN/creatinine:  Lab Results  Component Value Date   BUN 32 (H) 05/10/2016   BUN 33 (H) 04/27/2016   CREATININE 1.32 (H) 05/10/2016   CREATININE 1.56 (H) 04/27/2016  02/16/2016: 42/1.34 (GFR 40), Glu 213  12/26/2015: 25/1.23 (GFR 44), Glu 153 On lisinopril. - last set of  lipids: 02/16/2016: 128/278/33/39  03/02/2014: 114/175/32/48  On Zocor. - last eye exam was in 09/2015 (Dr. Gershon Crane). No DR.  - + numbness and tingling in her feet.  She also has a history of HTN, HL, reflux, anemia.  ROS: Constitutional: + weight gain, +  fatigue, no subjective hyperthermia/hypothermia Eyes: no blurry vision, no xerophthalmia ENT: no sore throat, no nodules palpated in throat, no dysphagia/odynophagia, no hoarseness  Cardiovascular: no CP/SOB/palpitations/leg swelling Respiratory: no cough/no SOB Gastrointestinal: +N/no V/+ D/+ heartburn Musculoskeletal: + mm/joint aches Skin: no Rash Neurological: no tremors/numbness/tingling/dizziness  I reviewed pt's medications, allergies, PMH, social hx, family hx, and changes were documented in the history of present illness. Otherwise, unchanged from my initial visit note.  Past Medical History:  Diagnosis Date  . Anemia in chronic renal disease 06/05/2011  . Anxiety    takes Ativan tid  . Asthma    has an inhaler prn  . Cough   . DDD (degenerative disc disease), cervical 06/05/2011  . DDD (degenerative disc disease), lumbosacral 06/05/2011  . Depression    takes Cymbalta daily  . Diabetes mellitus    takes Amaryl and Metformin daily  . Diabetic neuropathy ([place])   . Diverticulosis   . Dizziness   . GERD (gastroesophageal reflux disease)    takes Protonix prn  . Headache(784.0)    occasionally  . History of bronchitis    last  time 8yrs ago  . History of colon polyps   . History of MRSA infection   . HTN (hypertension) 06/05/2011   takes Prinizide daily  . Hyperlipidemia    takes Zocor daily  . Insomnia    not on any meds at present time  . Osteoarthritis   . PONV (postoperative nausea and vomiting)    hard to wake up  . Psoriasis 06/05/2011   on legs and uses cream prn  . Seasonal allergies    but doesn't take any meds  . Sleep apnea    uses CPAP  . Tubular adenoma of colon    Past Surgical History:   Procedure Laterality Date  . ABDOMINAL HYSTERECTOMY  1982 and 1984  . AMPUTATION  02/12/2012   Procedure: AMPUTATION RAY;  Surgeon: Wylene Simmer, MD;  Location: [facility];  Service: Orthopedics;  Laterality: Right;  RIGHT 2ND TOE AMPUTATION   . BACK SURGERY     x 3  . CARDIAC CATHETERIZATION     08/09/04: 50% mid AV groove CX lesion, NL LM, LAD, Ramus, EF > 60% (Dr. Gwenlyn Found)  . CARPAL TUNNEL RELEASE Bilateral 2010  . CERVICAL SPINE SURGERY    . CHOLECYSTECTOMY  1977  . COLONOSCOPY    . DILATION AND CURETTAGE OF UTERUS    . ELBOW SURGERY     left d/t tendonitis  . ESOPHAGOGASTRODUODENOSCOPY    . LUMBAR WOUND DEBRIDEMENT N/A 07/15/2012   Procedure: I & D of Lumbar Wound: Possible Repair of CSF Leak;  Surgeon: Elaina Hoops, MD;  Location: [facility];  Service: Neurosurgery;  Laterality: N/A;  I & D of Lumbar Wound: Possible Repair of CSF Leak placement lumbar drain  . right knee arthroscopy     x 2  . SHOULDER ARTHROSCOPY Left   . TOE AMPUTATION Left    Social History   Social History  . Marital status: Married    Spouse name: N/A  . Number of children: 2   Occupational History  . Disabled    Social History Main Topics  . Smoking status: Never Smoker  . Smokeless tobacco: Never Used     Comment: never used tobacco  . Alcohol use No  . Drug use: No   Current Outpatient Prescriptions on File Prior to Visit  Medication Sig Dispense Refill  . albuterol (PROVENTIL HFA;VENTOLIN HFA) 108 (90 BASE) MCG/ACT inhaler Inhale 1 puff into the lungs as needed for shortness of breath.     Marland Kitchen aspirin 81 MG tablet Take 81 mg by mouth daily.    . clobetasol (OLUX) 0.05 % topical foam Apply 1 application topically daily as needed (apply to head for dry scalp).     . COLLAGEN-VITAMIN C PO Take by mouth daily.    . cyclobenzaprine (FLEXERIL) 10 MG tablet Take 10 mg by mouth as needed for muscle spasms.    Marland Kitchen dicyclomine (BENTYL) 20 MG tablet Take 1 tablet (20 mg total) by mouth 3 (three) times daily. 90  tablet 1  . diphenhydrAMINE (BENADRYL) 50 MG tablet Take as directed 1 tablet 0  . DULoxetine (CYMBALTA) 30 MG capsule Take 90 mg by mouth daily.  5  . fluconazole (DIFLUCAN) 150 MG tablet Take 1 tablet (150 mg total) by mouth daily. 1 tablet 1  . JARDIANCE 25 MG TABS tablet Take half a tablet every day. 30 tablet 3  . lisinopril-hydrochlorothiazide (PRINZIDE,ZESTORETIC) 20-25 MG per tablet Take 1 tablet by mouth daily.    Marland Kitchen LORazepam (ATIVAN) 1  MG tablet Take 1 mg by mouth daily as needed.     . metFORMIN (GLUCOPHAGE) 1000 MG tablet Take 1 tablet (1,000 mg total) by mouth 2 (two) times daily with a meal. 180 tablet 1  . metoprolol succinate (TOPROL-XL) 25 MG 24 hr tablet     . Multiple Vitamin (MULTI-VITAMIN DAILY PO) Take 1 tablet by mouth daily.     . NON FORMULARY Phazyme 1 daily    . ondansetron (ZOFRAN) 8 MG tablet TAKE 1 TABLET BY MOUTH 3 TIMES A DAY AS NEEDED NAUSEA (INS PAYS #18/21 DAYS)  0  . ONETOUCH VERIO test strip USE TO TEST BLOOD SUGARS 2 times daily (DX: E11.65) 100 each 11  . pantoprazole (PROTONIX) 40 MG tablet Take 40 mg by mouth daily as needed (for reflux). For acid reflux    . Probiotic Product (PROBIOTIC DAILY PO) Take by mouth. Probiotic plus daily    . ranitidine (ZANTAC) 150 MG tablet Take 1 tablet (150 mg total) by mouth every evening. As needed. 30 tablet 2  . simvastatin (ZOCOR) 40 MG tablet Take 40 mg by mouth at bedtime.     . traMADol (ULTRAM) 50 MG tablet TAKE 1 OR 2 TABLETS BY MOUTH EVERY 6 TO 8 HOURS AS NEEDED FOR PAIN  0  . TRULICITY 1.5 ZE/0.9QZ SOPN INJECT ONCE A WEEK 4 pen 5   No current facility-administered medications on file prior to visit.    Allergies  Allergen Reactions  . Victoza [Liraglutide]     Severe rash and hives.  . Dilaudid [Hydromorphone]     Rash and swelling  . Ultram [Tramadol] Rash  . Betadine [Povidone Iodine] Other (See Comments)    Skin peels  . Bydureon [Exenatide] Diarrhea and Nausea And Vomiting  . Codeine     Severe  headaches  . Contrast Media [Iodinated Diagnostic Agents] Nausea And Vomiting  . Hydrocodone Other (See Comments)    headache  . Iodine Hives    Iodine hives  . Januvia [Sitagliptin] Diarrhea  . Morphine And Related Nausea And Vomiting  . Percocet [Oxycodone-Acetaminophen] Other (See Comments)    Headache   . Septra [Sulfamethoxazole-Trimethoprim]     rash  . Sulfa Antibiotics Itching  . Valium Other (See Comments)    headache  . Vicodin [Hydrocodone-Acetaminophen] Other (See Comments)    headache  . Amoxicillin Hives and Rash  . Darvocet [Propoxyphene N-Acetaminophen] Rash and Other (See Comments)    headache  . Fentanyl And Related Rash  . Wellbutrin [Bupropion Hcl] Rash   Family History  Problem Relation Age of Onset  . Colon cancer Brother 77  . Hypertension Mother     died from infection s/p hip surgery  . Heart attack Father   . Colon cancer Maternal Grandfather   . Stomach cancer Neg Hx   . Esophageal cancer Neg Hx   . Rectal cancer Neg Hx   . Liver cancer Neg Hx     PE: BP 132/78 (BP Location: Left Arm, Patient Position: Sitting)   Pulse 76   Ht 5\' 3"  (1.6 m)   Wt 213 lb (96.6 kg)   SpO2 97%   BMI 37.73 kg/m  Wt Readings from Last 3 Encounters:  08/07/16 213 lb (96.6 kg)  05/10/16 209 lb (94.8 kg)  05/10/16 209 lb (94.8 kg)   Constitutional: overweight, in NAD Eyes: anisocoria - R pupil larger and less reactive to light, EOMI, no exophthalmos ENT: moist mucous membranes, no thyromegaly, no cervical lymphadenopathy; ant. neck  scar 2/2 cervical spine surgery Cardiovascular: RRR, No MRG Respiratory: CTA B Gastrointestinal: abdomen soft, NT, ND, BS+ Musculoskeletal: no deformities, strength intact in all 4 Skin: moist, warm, no rashes Neurological: no tremor with outstretched hands, DTR normal in all 4  ASSESSMENT: 1. DM2, non-insulin-dependent, uncontrolled, with complications: - PN - s/p B 2nd toe amputations 2/2 osteomyelitis - 2013 and  2015  PLAN:  1. Patient with long-standing, uncontrolled diabetes, on oral antidiabetic regimen + Trulicity. She was taken off Trulicity during the summer 2017 due to diverticulitis, however, she restarted it and tolerates it very well. At last visit, we had to decrease the dose of Jardiance because her kidney function was slightly low (however, improved). Her sugars are not much changed from before (some are higher, some lower). - Latest HbA1c was 6.8% >> today, stable, at 6.8% - at last visit, I also referred her to nutrition >> she did see the dietitian since then - At this visit, we also discussed about the benefits of a plant-based diet and I gave her some references about this. She is interested in starting. In this case, we'll not make any changes in her regimen right now, but we may need intensification of treatment if she cannot start the diet, at next visit. - I suggested to:   Patient Instructions  Please continue: - Metformin ER 1000 mg 2x a day, with meals - Jardiance 1/2 of a 25 mg tablet in am - Trulicity 1.5 mg weekly   Please read: - Program for Reversing Diabetes by DR. Alyssa Grove - Eat to Live by Dr. Excell Seltzer - How not to die by Dr. Alden Benjamin  Please come back for a follow-up appointment in 3 months.  -continue to check sugars at different times of the day - check 1-2 times a day, rotating checks - advised for yearly eye exams >> she is UTD, but needs one soon - UTD with flu shot - Return to clinic in 3 mo with sugar log  Philemon Kingdom, MD PhD [facility]

## 2016-08-16 ENCOUNTER — Other Ambulatory Visit: Payer: Self-pay

## 2016-08-16 ENCOUNTER — Encounter: Payer: Self-pay | Admitting: Internal Medicine

## 2016-08-16 MED ORDER — DICYCLOMINE HCL 20 MG PO TABS: 20.0000 mg | ORAL_TABLET | Freq: Three times a day (TID) | ORAL | 1 refills | Status: DC

## 2016-08-17 ENCOUNTER — Ambulatory Visit (INDEPENDENT_AMBULATORY_CARE_PROVIDER_SITE_OTHER): Payer: BC Managed Care – PPO | Admitting: Nurse Practitioner

## 2016-08-17 ENCOUNTER — Other Ambulatory Visit (INDEPENDENT_AMBULATORY_CARE_PROVIDER_SITE_OTHER): Payer: BC Managed Care – PPO

## 2016-08-17 ENCOUNTER — Telehealth: Payer: Self-pay | Admitting: Internal Medicine

## 2016-08-17 ENCOUNTER — Encounter: Payer: Self-pay | Admitting: Nurse Practitioner

## 2016-08-17 VITALS — BP 130/60 | HR 100 | Temp 99.4°F | Ht 63.0 in | Wt 210.0 lb

## 2016-08-17 DIAGNOSIS — K5712 Diverticulitis of small intestine without perforation or abscess without bleeding: Secondary | ICD-10-CM | POA: Diagnosis not present

## 2016-08-17 LAB — CBC WITH DIFFERENTIAL/PLATELET
BASOS ABS: 0 10*3/uL (ref 0.0–0.1)
Basophils Relative: 0.3 % (ref 0.0–3.0)
EOS ABS: 0.1 10*3/uL (ref 0.0–0.7)
Eosinophils Relative: 0.8 % (ref 0.0–5.0)
HCT: 29.8 % — ABNORMAL LOW (ref 36.0–46.0)
Hemoglobin: 10.1 g/dL — ABNORMAL LOW (ref 12.0–15.0)
LYMPHS ABS: 1.5 10*3/uL (ref 0.7–4.0)
Lymphocytes Relative: 10.9 % — ABNORMAL LOW (ref 12.0–46.0)
MCHC: 33.7 g/dL (ref 30.0–36.0)
MCV: 93.4 fl (ref 78.0–100.0)
MONO ABS: 1.2 10*3/uL — AB (ref 0.1–1.0)
MONOS PCT: 8.6 % (ref 3.0–12.0)
NEUTROS PCT: 79.4 % — AB (ref 43.0–77.0)
Neutro Abs: 11.1 10*3/uL — ABNORMAL HIGH (ref 1.4–7.7)
Platelets: 256 10*3/uL (ref 150.0–400.0)
RBC: 3.19 Mil/uL — AB (ref 3.87–5.11)
RDW: 12.7 % (ref 11.5–15.5)
WBC: 14 10*3/uL — ABNORMAL HIGH (ref 4.0–10.5)

## 2016-08-17 MED ORDER — CIPROFLOXACIN HCL 500 MG PO TABS: 500.0000 mg | ORAL_TABLET | Freq: Two times a day (BID) | ORAL | 0 refills | Status: DC

## 2016-08-17 MED ORDER — METRONIDAZOLE 500 MG PO TABS: 500.0000 mg | ORAL_TABLET | Freq: Three times a day (TID) | ORAL | 0 refills | Status: DC

## 2016-08-17 NOTE — Patient Instructions (Signed)
If you are age 64 or older, your body mass index should be between 23-30. Your Body mass index is 37.2 kg/m. If this is out of the aforementioned range listed, please consider follow up with your Primary Care Provider.  If you are age 37 or younger, your body mass index should be between 19-25. Your Body mass index is 37.2 kg/m. If this is out of the aformentioned range listed, please consider follow up with your Primary Care Provider.   We have sent the following medications to your pharmacy for you to pick up at your convenience: Cipro 500 mg Flagyl 500 mg  Your physician has requested that you go to the basement for the following lab work before leaving today: CBC  Please call the office in 10 days with an update.  IF you get worse over the weekend, please go to the emergency room.  Thank you for choosing me and [facility].  Tye Savoy, NP

## 2016-08-17 NOTE — Progress Notes (Addendum)
HPI: Patient is a 66 year old female known to Dr. Hilarie Fredrickson. Her GI history is significant for GERD, adenomatous colon polyps, and a family history of colon cancer in her brother. A few months ago patient was seen for evaluation of abdominal pain. CT scan scan suggested focal enteritis versus small bowel diverticulitis. She responded to antibiotics prescribed by PCP. Patient was seen here in November for follow-up. Study ordered for further evaluation but study incomplete as the pill became retained in the small bowel diverticulum. Patient was therefore scheduled for a CT enterography that was changed to MR enterography given her IV contrast allergy . MR enterography in January revealed jejunal diverticulosis.  On Sunday patient developed recurrent left-sided mid and lower abdominal pain. Pain became progressive over the next couple of days. Patient call the office for refill on dicyclomine. She is now feeling better though pain has not resolved. She has no nausea or vomiting. No bowel changes. Her temp is 99 in the office..   Past Medical History:  Diagnosis Date  . Anemia in chronic renal disease 06/05/2011  . Anxiety    takes Ativan tid  . Asthma    has an inhaler prn  . Cough   . DDD (degenerative disc disease), cervical 06/05/2011  . DDD (degenerative disc disease), lumbosacral 06/05/2011  . Depression    takes Cymbalta daily  . Diabetes mellitus    takes Amaryl and Metformin daily  . Diabetic neuropathy ([place])   . Diverticulosis   . Dizziness   . GERD (gastroesophageal reflux disease)    takes Protonix prn  . Headache(784.0)    occasionally  . History of bronchitis    last time 25yrs ago  . History of colon polyps   . History of MRSA infection   . HTN (hypertension) 06/05/2011   takes Prinizide daily  . Hyperlipidemia    takes Zocor daily  . Insomnia    not on any meds at present time  . Osteoarthritis   . PONV (postoperative nausea and vomiting)    hard to wake up  .  Psoriasis 06/05/2011   on legs and uses cream prn  . Seasonal allergies    but doesn't take any meds  . Sleep apnea    uses CPAP  . Tubular adenoma of colon     Patient's surgical history, family medical history, social history, medications and allergies were all reviewed in Epic    Physical Exam: BP 130/60   Pulse 100   Temp 99.4 F (37.4 C)   Ht 5\' 3"  (1.6 m)   Wt 210 lb (95.3 kg)   BMI 37.20 kg/m   GENERAL: pleasant white female in NAD PSYCH: :Pleasant, cooperative, normal affect HEENT:  conjunctiva pink, mucous membranes moist, neck supple without masses CARDIAC:  RRR,  No peripheral edema PULM: Normal respiratory effort, lungs CTA bilaterally, no wheezing ABDOMEN:  soft, , nondistended, no obvious masses,  normal bowel sounds. Moderate left mid abdominal tenderness.  SKIN:  turgor, no lesions seen Musculoskeletal:  Good muscle tone, good strength NEURO: Alert and oriented x 3, no focal neurologic deficits    ASSESSMENT and PLAN:   66 year old female back with recurrent left sided abdominal pain which is reminiscent of when she was treated for presumed small bowel diverticulitis. Recent MR enterography reveals jejunal diverticulosis. Her current abdominal pain may well represent small bowel diverticulosis.  -obtain CBC -cipro and flagyl. Asked her to call in a few days with a condition update, or  sooner if not improving -continue Bentyl as it does help with the pain -I discussed with Dr. Havery Moros today (in Dr. Vena Rua absence). She may need eventual surgical evaluation but will defer to her primary GI Dr. Hilarie Fredrickson.      Tye Savoy , NP 08/17/2016, 2:56 PM  Addendum: Reviewed and agree with  management. Jerene Bears, MD

## 2016-08-17 NOTE — Telephone Encounter (Signed)
Pt sent mychart message yesterday stating she was having abdominal pain and in the past she took Dicyclomine. Refill was sent for this medication. Today pt states she started running a fever and thinks she is having a diverticulitis flare. Pt scheduled to see Tye Savoy NP today at 2:30pm. Pt aware of appt.

## 2016-09-02 ENCOUNTER — Other Ambulatory Visit: Payer: Self-pay | Admitting: Internal Medicine

## 2016-10-02 ENCOUNTER — Encounter: Payer: Self-pay | Admitting: Internal Medicine

## 2016-10-02 ENCOUNTER — Other Ambulatory Visit: Payer: Self-pay | Admitting: Internal Medicine

## 2016-10-02 MED ORDER — INSULIN PEN NEEDLE 32G X 4 MM MISC: 3 refills | Status: DC

## 2016-10-02 MED ORDER — INSULIN ASPART 100 UNIT/ML FLEXPEN: 6.0000 [IU] | PEN_INJECTOR | Freq: Three times a day (TID) | SUBCUTANEOUS | 2 refills | Status: DC

## 2016-10-26 ENCOUNTER — Other Ambulatory Visit: Payer: Self-pay | Admitting: Physician Assistant

## 2016-10-26 ENCOUNTER — Ambulatory Visit
Admission: RE | Admit: 2016-10-26 | Discharge: 2016-10-26 | Disposition: A | Discharge status: Home / Self Care | Payer: BC Managed Care – PPO | Source: Ambulatory Visit | Attending: Physician Assistant | Admitting: Physician Assistant

## 2016-10-26 DIAGNOSIS — W19XXXA Unspecified fall, initial encounter: Secondary | ICD-10-CM

## 2016-11-22 ENCOUNTER — Ambulatory Visit: Payer: BC Managed Care – PPO | Admitting: Internal Medicine

## 2017-01-24 ENCOUNTER — Ambulatory Visit (INDEPENDENT_AMBULATORY_CARE_PROVIDER_SITE_OTHER): Payer: BC Managed Care – PPO | Admitting: Internal Medicine

## 2017-01-24 ENCOUNTER — Encounter: Payer: Self-pay | Admitting: Internal Medicine

## 2017-01-24 VITALS — BP 132/72 | HR 77 | Wt 212.0 lb

## 2017-01-24 DIAGNOSIS — E1165 Type 2 diabetes mellitus with hyperglycemia: Secondary | ICD-10-CM | POA: Diagnosis not present

## 2017-01-24 DIAGNOSIS — Z23 Encounter for immunization: Secondary | ICD-10-CM | POA: Diagnosis not present

## 2017-01-24 DIAGNOSIS — E785 Hyperlipidemia, unspecified: Secondary | ICD-10-CM | POA: Insufficient documentation

## 2017-01-24 DIAGNOSIS — E1142 Type 2 diabetes mellitus with diabetic polyneuropathy: Secondary | ICD-10-CM

## 2017-01-24 DIAGNOSIS — IMO0002 Reserved for concepts with insufficient information to code with codable children: Secondary | ICD-10-CM

## 2017-01-24 LAB — POCT GLYCOSYLATED HEMOGLOBIN (HGB A1C): Hemoglobin A1C: 6.8

## 2017-01-24 NOTE — Patient Instructions (Signed)
Please continue: - Metformin ER 1000 mg 2x a day, with meals - Jardiance 1/2 of a 25 mg tablet in am - Trulicity 1.5 mg weekly   Please come back for a follow-up appointment in 4 months.

## 2017-01-24 NOTE — Progress Notes (Signed)
Patient ID: [name], female   DOB: [birth year], 66 y.o.   MRN: [id number]   HPI: [name] is a 66 y.o.-year-old female, initially referred by her PCP, Dr. Harley Alto for f/u for DM2, dx in 1980s, non-insulin-dependent, uncontrolled, with complications (PN - s/p B digit amputations 2/2 osteomyelitis). She saw Dr. Elyse Hsu previously. Last visit with me 5.5 mo ago.  In 09/2016, she had a steroid inj in back >> sugars 200s >> started Novolog for 3 days.   Last hemoglobin A1c was: Lab Results  Component Value Date   HGBA1C 6.8 08/07/2016   HGBA1C 6.8 05/10/2016   HGBA1C 7.3 (H) 02/10/2012  02/16/2016: 7.1% 06/2015: 6.6%  Pt is on a regimen of: - Metformin ER 1000 mg 2x a day, with meals - Jardiance 25 mg in am >> 12.5 mg 2/2 CKD (decreased 93/7902) - Trulicity 1.5 mg weekly - restarted 02/2016 >> tolerates this well She was on Victoza >> rash.  Pt checks her sugars 1-2x a day and they are: - am: 409-735H, 299ME2 (on Trulicity [id number]) >> 222-979, 153 >> 110-138, 142 >> 120-135, 142 - 2h after b'fast: n/c >> 125-140 >> 117-160 >> 120-142 - before lunch: n/c >> 123 >> 130-140 >> 120-130 - 2h after lunch: 150s >> 140 >> 160 >> 130-141 - before dinner: n/c >> 140 >> 134-188, 204 >> 133-135 - 2h after dinner: n/c >> 137-155 >> 137-144,157 >> 138-150, 171 - bedtime: 150-170 >> 111, 140 >> 119, 120, 168 >> 135, 140 - nighttime: n/c Lowest sugar was 119 >> 111 >> 110 >> 120; she has hypoglycemia awareness at 90s  Highest sugar was 300s >> 155 >>204 >> 280s.  Glucometer: One Touch Verio  Pt's meals are: - Breakfast: [place] bacon, 2 eggs, toast; oatmeal; or brown rice - Lunch: Tuna, crackers or sandwich, chicken strips, chips, diet Pepsi - Dinner: Meat, veggies, starch  - Snacks: 2-3: 15g carbs (pretzels, chips)  For exercise, she walks or rides stationary bike 4 times a week.  - she has CKD, last BUN/creatinine:  Lab Results  Component Value Date   BUN 32 (H)  05/10/2016   BUN 33 (H) 04/27/2016   CREATININE 1.32 (H) 05/10/2016   CREATININE 1.56 (H) 04/27/2016  02/16/2016: 42/1.34 (GFR 40), Glu 213  12/26/2015: 25/1.23 (GFR 44), Glu 153 On Lisinopril. - last set of lipids: 02/16/2016: 128/278/33/39  03/02/2014: 114/175/32/48  On Zocor. - last eye exam was in 10/2016 >> No DR (Dr. Gershon Crane). Cataracts worse. - se has numbness and tingling in her feet.  She also has a history of HTN, HL, reflux, anemia.  ROS: Constitutional: no weight gain/no weight loss, + fatigue, no subjective hyperthermia, no subjective hypothermia Eyes: no blurry vision, no xerophthalmia ENT: no sore throat, no nodules palpated in throat, no dysphagia, no odynophagia, no hoarseness Cardiovascular: no CP/no SOB/no palpitations/+ leg swelling - R foot Respiratory: + cough/no SOB/no wheezing Gastrointestinal: no N/no V/no D/no C/+cid reflux Musculoskeletal: +  muscle aches/+  joint aches Skin: no rashes, + hair loss Neurological: no tremors/+ numbness/+ tingling/no dizziness  I reviewed pt's medications, allergies, PMH, social hx, family hx, and changes were documented in the history of present illness. Otherwise, unchanged from my initial visit note.  Past Medical History:  Diagnosis Date  . Anemia in chronic renal disease 06/05/2011  . Anxiety    takes Ativan tid  . Asthma    has an inhaler prn  . Cough   . DDD (degenerative disc disease), cervical  06/05/2011  . DDD (degenerative disc disease), lumbosacral 06/05/2011  . Depression    takes Cymbalta daily  . Diabetes mellitus    takes Amaryl and Metformin daily  . Diabetic neuropathy ([place])   . Diverticulosis   . Dizziness   . GERD (gastroesophageal reflux disease)    takes Protonix prn  . Headache(784.0)    occasionally  . History of bronchitis    last time 46yrs ago  . History of colon polyps   . History of MRSA infection   . HTN (hypertension) 06/05/2011   takes Prinizide daily  . Hyperlipidemia     takes Zocor daily  . Insomnia    not on any meds at present time  . Osteoarthritis   . PONV (postoperative nausea and vomiting)    hard to wake up  . Psoriasis 06/05/2011   on legs and uses cream prn  . Seasonal allergies    but doesn't take any meds  . Sleep apnea    uses CPAP  . Tubular adenoma of colon    Past Surgical History:  Procedure Laterality Date  . ABDOMINAL HYSTERECTOMY  1982 and 1984  . AMPUTATION  02/12/2012   Procedure: AMPUTATION RAY;  Surgeon: Wylene Simmer, MD;  Location: [facility];  Service: Orthopedics;  Laterality: Right;  RIGHT 2ND TOE AMPUTATION   . BACK SURGERY     x 3  . CARDIAC CATHETERIZATION     08/09/04: 50% mid AV groove CX lesion, NL LM, LAD, Ramus, EF > 60% (Dr. Gwenlyn Found)  . CARPAL TUNNEL RELEASE Bilateral 2010  . CERVICAL SPINE SURGERY    . CHOLECYSTECTOMY  1977  . COLONOSCOPY    . DILATION AND CURETTAGE OF UTERUS    . ELBOW SURGERY     left d/t tendonitis  . ESOPHAGOGASTRODUODENOSCOPY    . LUMBAR WOUND DEBRIDEMENT N/A 07/15/2012   Procedure: I & D of Lumbar Wound: Possible Repair of CSF Leak;  Surgeon: Elaina Hoops, MD;  Location: [facility];  Service: Neurosurgery;  Laterality: N/A;  I & D of Lumbar Wound: Possible Repair of CSF Leak placement lumbar drain  . right knee arthroscopy     x 2  . SHOULDER ARTHROSCOPY Left   . TOE AMPUTATION Left    Social History   Social History  . Marital status: Married    Spouse name: N/A  . Number of children: 2   Occupational History  . Disabled    Social History Main Topics  . Smoking status: Never Smoker  . Smokeless tobacco: Never Used     Comment: never used tobacco  . Alcohol use No  . Drug use: No   Current Outpatient Prescriptions on File Prior to Visit  Medication Sig Dispense Refill  . albuterol (PROVENTIL HFA;VENTOLIN HFA) 108 (90 BASE) MCG/ACT inhaler Inhale 1 puff into the lungs as needed for shortness of breath.     Marland Kitchen aspirin 81 MG tablet Take 81 mg by mouth daily.    . clobetasol  (OLUX) 0.05 % topical foam Apply 1 application topically daily as needed (apply to head for dry scalp).     . COLLAGEN-VITAMIN C PO Take by mouth daily.    . cyclobenzaprine (FLEXERIL) 10 MG tablet Take 10 mg by mouth as needed for muscle spasms.    Marland Kitchen dicyclomine (BENTYL) 20 MG tablet Take 1 tablet (20 mg total) by mouth 3 (three) times daily. 90 tablet 1  . diphenhydrAMINE (BENADRYL) 50 MG tablet Take as directed 1 tablet  0  . DULoxetine (CYMBALTA) 30 MG capsule Take 90 mg by mouth daily.  5  . fluconazole (DIFLUCAN) 150 MG tablet Take 1 tablet (150 mg total) by mouth daily. (Patient taking differently: Take 150 mg by mouth as needed. ) 1 tablet 1  . JARDIANCE 25 MG TABS tablet Take half a tablet every day. 30 tablet 3  . lisinopril-hydrochlorothiazide (PRINZIDE,ZESTORETIC) 20-25 MG per tablet Take 1 tablet by mouth daily.    Marland Kitchen LORazepam (ATIVAN) 1 MG tablet Take 1 mg by mouth daily as needed.     . metFORMIN (GLUCOPHAGE) 1000 MG tablet Take 1 tablet (1,000 mg total) by mouth 2 (two) times daily with a meal. 180 tablet 1  . metoprolol succinate (TOPROL-XL) 25 MG 24 hr tablet     . Multiple Vitamin (MULTI-VITAMIN DAILY PO) Take 1 tablet by mouth daily.     . NON FORMULARY Phazyme 1 daily    . ondansetron (ZOFRAN) 8 MG tablet TAKE 1 TABLET BY MOUTH 3 TIMES A DAY AS NEEDED NAUSEA (INS PAYS #18/21 DAYS)  0  . ONETOUCH VERIO test strip USE TO TEST BLOOD SUGARS 2 times daily (DX: E11.65) 100 each 11  . pantoprazole (PROTONIX) 40 MG tablet Take 40 mg by mouth daily as needed (for reflux). For acid reflux    . Probiotic Product (PROBIOTIC DAILY PO) Take by mouth. Probiotic plus daily    . ranitidine (ZANTAC) 150 MG tablet Take 1 tablet (150 mg total) by mouth every evening. As needed. 30 tablet 2  . simvastatin (ZOCOR) 40 MG tablet Take 40 mg by mouth at bedtime.     . traMADol (ULTRAM) 50 MG tablet TAKE 1 OR 2 TABLETS BY MOUTH EVERY 6 TO 8 HOURS AS NEEDED FOR PAIN  0  . TRULICITY 1.5 WU/1.3KG SOPN  INJECT ONCE A WEEK 2 pen 5   No current facility-administered medications on file prior to visit.    Allergies  Allergen Reactions  . Victoza [Liraglutide]     Severe rash and hives.  . Dilaudid [Hydromorphone]     Rash and swelling  . Ultram [Tramadol] Rash  . Betadine [Povidone Iodine] Other (See Comments)    Skin peels  . Bydureon [Exenatide] Diarrhea and Nausea And Vomiting  . Codeine     Severe headaches  . Contrast Media [Iodinated Diagnostic Agents] Nausea And Vomiting  . Hydrocodone Other (See Comments)    headache  . Iodine Hives    Iodine hives  . Januvia [Sitagliptin] Diarrhea  . Morphine And Related Nausea And Vomiting  . Percocet [Oxycodone-Acetaminophen] Other (See Comments)    Headache   . Septra [Sulfamethoxazole-Trimethoprim]     rash  . Sulfa Antibiotics Itching  . Ultram [Tramadol Hcl]   . Valium Other (See Comments)    headache  . Vicodin [Hydrocodone-Acetaminophen] Other (See Comments)    headache  . Amoxicillin Hives and Rash  . Darvocet [Propoxyphene N-Acetaminophen] Rash and Other (See Comments)    headache  . Fentanyl And Related Rash  . Wellbutrin [Bupropion Hcl] Rash   Family History  Problem Relation Age of Onset  . Colon cancer Brother 107  . Hypertension Mother        died from infection s/p hip surgery  . Heart attack Father   . Colon cancer Maternal Grandfather   . Stomach cancer Neg Hx   . Esophageal cancer Neg Hx   . Rectal cancer Neg Hx   . Liver cancer Neg Hx     PE:  BP 132/72 (BP Location: Left Arm, Patient Position: Sitting)   Pulse 77   Wt 212 lb (96.2 kg)   SpO2 98%   BMI 37.55 kg/m   Wt Readings from Last 3 Encounters:  01/24/17 212 lb (96.2 kg)  08/17/16 210 lb (95.3 kg)  08/07/16 213 lb (96.6 kg)   Constitutional: overweight, in NAD Eyes: anisocoria - R pupil larger and less reactive to light, EOMI, no exophthalmos ENT: moist mucous membranes, no thyromegaly, no cervical lymphadenopathy Cardiovascular: RRR,  No MRG Respiratory: CTA B Gastrointestinal: abdomen soft, NT, ND, BS+ Musculoskeletal: no deformities, strength intact in all 4 Skin: moist, warm, no rashes Neurological: no tremor with outstretched hands, DTR normal in all 4  ASSESSMENT: 1. DM2, non-insulin-dependent, uncontrolled, with complications: - PN - s/p B 2nd toe amputations 2/2 osteomyelitis - 2013 and 2015  PLAN:  1. Patient with long-standing, Uncontrolled diabetes, on oral antidiabetic regimen and Trulicity. She was taken off Trulicity during the summer of 2017 due to diverticulitis, however, since then, she restarted it and tolerates it very well. She is only on half dose of Jardiance due to her decreased GFR. She is interested to come off this medication and I think we may consider this at next visit if her sugars improve a little bit more. - At this visit, her sugars are stable, without significant fluctuations throughout the day - No change in regimen is needed today  - I suggested to:   Patient Instructions  Please continue: - Metformin ER 1000 mg 2x a day, with meals - Jardiance 1/2 of a 25 mg tablet in am - Trulicity 1.5 mg weekly   Please come back for a follow-up appointment in 4 months.  - today, HbA1c is 6.8% (stable) - continue checking sugars at different times of the day - check 1-2x a day, rotating checks - advised for yearly eye exams >> she is UTD Will give her the flu shot today-  - Return to clinic in 4 mo with sugar log   2. HL - LDL very well controlled on Zocor. -  she has another appointment with PCP coming up in November  - Continue Zocor. No side effects from it.   Philemon Kingdom, MD PhD [facility] Endocrinology

## 2017-01-24 NOTE — Addendum Note (Signed)
Addended by: Caprice Beaver T on: 01/24/2017 01:11 PM   Modules accepted: Orders

## 2017-01-24 NOTE — Addendum Note (Signed)
Addended by: Caprice Beaver T on: 01/24/2017 11:05 AM   Modules accepted: Orders

## 2017-02-06 ENCOUNTER — Encounter: Payer: Self-pay | Admitting: Podiatry

## 2017-02-06 ENCOUNTER — Ambulatory Visit (INDEPENDENT_AMBULATORY_CARE_PROVIDER_SITE_OTHER): Payer: BC Managed Care – PPO | Admitting: Podiatry

## 2017-02-06 VITALS — BP 144/74 | HR 73 | Temp 99.6°F | Resp 18

## 2017-02-06 DIAGNOSIS — M2012 Hallux valgus (acquired), left foot: Secondary | ICD-10-CM

## 2017-02-06 DIAGNOSIS — E1149 Type 2 diabetes mellitus with other diabetic neurological complication: Secondary | ICD-10-CM | POA: Diagnosis not present

## 2017-02-06 DIAGNOSIS — L97529 Non-pressure chronic ulcer of other part of left foot with unspecified severity: Secondary | ICD-10-CM | POA: Diagnosis not present

## 2017-02-06 DIAGNOSIS — Z899 Acquired absence of limb, unspecified: Secondary | ICD-10-CM

## 2017-02-06 DIAGNOSIS — I739 Peripheral vascular disease, unspecified: Secondary | ICD-10-CM | POA: Diagnosis not present

## 2017-02-06 DIAGNOSIS — M2011 Hallux valgus (acquired), right foot: Secondary | ICD-10-CM

## 2017-02-06 DIAGNOSIS — L853 Xerosis cutis: Secondary | ICD-10-CM

## 2017-02-06 MED ORDER — DOXYCYCLINE HYCLATE 100 MG PO TABS: 100.0000 mg | ORAL_TABLET | Freq: Two times a day (BID) | ORAL | 0 refills | Status: DC

## 2017-02-06 MED ORDER — SILVER SULFADIAZINE 1 % EX CREA: 1.0000 "application " | TOPICAL_CREAM | Freq: Every day | CUTANEOUS | 0 refills | Status: DC

## 2017-02-06 NOTE — Progress Notes (Signed)
Subjective:    Patient ID: [name], female    DOB: 28-Nov-1950, 66 y.o.   MRN: [id number]  HPI  66 year old female presents the office today for concerns of wound to left big toe which is been ongoing the last couple weeks. She states that she had a hard place on the toe and she uses an electric shaver to help shape the callus. She started to notice a spot film of the toe and she is Neosporin. After couple additional mature primary care physician and she was given mupirocin ointment. She states the wound discussed somewhat bigger and she presents today for follow-up. She denies any drainage or pus coming from the area and denies any redness or any red streaks. She does admit to neuropathy but she denies any claudication symptoms. She states her AM blood sugar was around 124. She has no other concerns today.  Last A1c was 6.8 on 01/24/2017   Review of Systems  All other systems reviewed and are negative.  Past Medical History:  Diagnosis Date  . Anemia in chronic renal disease 06/05/2011  . Anxiety    takes Ativan tid  . Asthma    has an inhaler prn  . Cough   . DDD (degenerative disc disease), cervical 06/05/2011  . DDD (degenerative disc disease), lumbosacral 06/05/2011  . Depression    takes Cymbalta daily  . Diabetes mellitus    takes Amaryl and Metformin daily  . Diabetic neuropathy ([place])   . Diverticulosis   . Dizziness   . GERD (gastroesophageal reflux disease)    takes Protonix prn  . Headache(784.0)    occasionally  . History of bronchitis    last time 85yrs ago  . History of colon polyps   . History of MRSA infection   . HTN (hypertension) 06/05/2011   takes Prinizide daily  . Hyperlipidemia    takes Zocor daily  . Insomnia    not on any meds at present time  . Osteoarthritis   . PONV (postoperative nausea and vomiting)    hard to wake up  . Psoriasis 06/05/2011   on legs and uses cream prn  . Seasonal allergies    but doesn't take any meds  . Sleep  apnea    uses CPAP  . Tubular adenoma of colon     Past Surgical History:  Procedure Laterality Date  . ABDOMINAL HYSTERECTOMY  1982 and 1984  . AMPUTATION  02/12/2012   Procedure: AMPUTATION RAY;  Surgeon: Wylene Simmer, MD;  Location: [facility];  Service: Orthopedics;  Laterality: Right;  RIGHT 2ND TOE AMPUTATION   . BACK SURGERY     x 3  . CARDIAC CATHETERIZATION     08/09/04: 50% mid AV groove CX lesion, NL LM, LAD, Ramus, EF > 60% (Dr. Gwenlyn Found)  . CARPAL TUNNEL RELEASE Bilateral 2010  . CERVICAL SPINE SURGERY    . CHOLECYSTECTOMY  1977  . COLONOSCOPY    . DILATION AND CURETTAGE OF UTERUS    . ELBOW SURGERY     left d/t tendonitis  . ESOPHAGOGASTRODUODENOSCOPY    . LUMBAR WOUND DEBRIDEMENT N/A 07/15/2012   Procedure: I & D of Lumbar Wound: Possible Repair of CSF Leak;  Surgeon: Elaina Hoops, MD;  Location: [facility] [facility];  Service: Neurosurgery;  Laterality: N/A;  I & D of Lumbar Wound: Possible Repair of CSF Leak placement lumbar drain  . right knee arthroscopy     x 2  . SHOULDER ARTHROSCOPY Left   .  TOE AMPUTATION Left      Current Outpatient Prescriptions:  .  albuterol (PROVENTIL HFA;VENTOLIN HFA) 108 (90 BASE) MCG/ACT inhaler, Inhale 1 puff into the lungs as needed for shortness of breath. , Disp: , Rfl:  .  aspirin 81 MG tablet, Take 81 mg by mouth daily., Disp: , Rfl:  .  clobetasol (OLUX) 0.05 % topical foam, Apply 1 application topically daily as needed (apply to head for dry scalp). , Disp: , Rfl:  .  COLLAGEN-VITAMIN C PO, Take by mouth daily., Disp: , Rfl:  .  cyclobenzaprine (FLEXERIL) 10 MG tablet, Take 10 mg by mouth as needed for muscle spasms., Disp: , Rfl:  .  dicyclomine (BENTYL) 20 MG tablet, Take 1 tablet (20 mg total) by mouth 3 (three) times daily., Disp: 90 tablet, Rfl: 1 .  diphenhydrAMINE (BENADRYL) 50 MG tablet, Take as directed, Disp: 1 tablet, Rfl: 0 .  DULoxetine (CYMBALTA) 30 MG capsule, Take 90 mg by mouth daily., Disp: , Rfl: 5 .  fluconazole (DIFLUCAN)  150 MG tablet, Take 1 tablet (150 mg total) by mouth daily. (Patient taking differently: Take 150 mg by mouth as needed. ), Disp: 1 tablet, Rfl: 1 .  JARDIANCE 25 MG TABS tablet, Take half a tablet every day., Disp: 30 tablet, Rfl: 3 .  lisinopril-hydrochlorothiazide (PRINZIDE,ZESTORETIC) 20-25 MG per tablet, Take 1 tablet by mouth daily., Disp: , Rfl:  .  LORazepam (ATIVAN) 1 MG tablet, Take 1 mg by mouth daily as needed. , Disp: , Rfl:  .  metFORMIN (GLUCOPHAGE) 1000 MG tablet, Take 1 tablet (1,000 mg total) by mouth 2 (two) times daily with a meal., Disp: 180 tablet, Rfl: 1 .  metoprolol succinate (TOPROL-XL) 25 MG 24 hr tablet, , Disp: , Rfl:  .  Multiple Vitamin (MULTI-VITAMIN DAILY PO), Take 1 tablet by mouth daily. , Disp: , Rfl:  .  NON FORMULARY, Phazyme 1 daily, Disp: , Rfl:  .  ondansetron (ZOFRAN) 8 MG tablet, TAKE 1 TABLET BY MOUTH 3 TIMES A DAY AS NEEDED NAUSEA (INS PAYS #18/21 DAYS), Disp: , Rfl: 0 .  ONETOUCH VERIO test strip, USE TO TEST BLOOD SUGARS 2 times daily (DX: E11.65), Disp: 100 each, Rfl: 11 .  pantoprazole (PROTONIX) 40 MG tablet, Take 40 mg by mouth daily as needed (for reflux). For acid reflux, Disp: , Rfl:  .  Probiotic Product (PROBIOTIC DAILY PO), Take by mouth. Probiotic plus daily, Disp: , Rfl:  .  ranitidine (ZANTAC) 150 MG tablet, Take 1 tablet (150 mg total) by mouth every evening. As needed., Disp: 30 tablet, Rfl: 2 .  simvastatin (ZOCOR) 40 MG tablet, Take 40 mg by mouth at bedtime. , Disp: , Rfl:  .  traMADol (ULTRAM) 50 MG tablet, TAKE 1 OR 2 TABLETS BY MOUTH EVERY 6 TO 8 HOURS AS NEEDED FOR PAIN, Disp: , Rfl: 0 .  TRULICITY 1.5 MW/1.0UV SOPN, INJECT ONCE A WEEK, Disp: 2 pen, Rfl: 5  Allergies  Allergen Reactions  . Victoza [Liraglutide]     Severe rash and hives.  . Dilaudid [Hydromorphone]     Rash and swelling  . Ultram [Tramadol] Rash  . Betadine [Povidone Iodine] Other (See Comments)    Skin peels  . Bydureon [Exenatide] Diarrhea and Nausea And  Vomiting  . Codeine     Severe headaches  . Contrast Media [Iodinated Diagnostic Agents] Nausea And Vomiting  . Hydrocodone Other (See Comments)    headache  . Iodine Hives    Iodine hives  .  Januvia [Sitagliptin] Diarrhea  . Morphine And Related Nausea And Vomiting  . Percocet [Oxycodone-Acetaminophen] Other (See Comments)    Headache   . Septra [Sulfamethoxazole-Trimethoprim]     rash  . Sulfa Antibiotics Itching  . Ultram [Tramadol Hcl]   . Valium Other (See Comments)    headache  . Vicodin [Hydrocodone-Acetaminophen] Other (See Comments)    headache  . Amoxicillin Hives and Rash  . Darvocet [Propoxyphene N-Acetaminophen] Rash and Other (See Comments)    headache  . Fentanyl And Related Rash  . Wellbutrin [Bupropion Hcl] Rash    Social History   Social History  . Marital status: Married    Spouse name: N/A  . Number of children: 2  . Years of education: N/A   Occupational History  . disabled    Social History Main Topics  . Smoking status: Never Smoker  . Smokeless tobacco: Never Used     Comment: never used tobacco  . Alcohol use No  . Drug use: No  . Sexual activity: Yes    Birth control/ protection: Surgical, Post-menopausal   Other Topics Concern  . Not on file   Social History Narrative  . No narrative on file   ABI in 2015 on the right was 1.3 Left 1.46 and TBI on the right .88 and left .95     Objective:   Physical Exam General: AAO x3, NAD  Dermatological: To the distal aspect of the left hallux on the distal medial portion is a superficial granular wound measuring 1 x 0.6 cm. There is no surrounding erythema, ascending cellulitis but there is mild swelling to the toe. There is no areas of fluctuance or crepitus. There is no malodor. Scars from prior surgery well-healed from second toe amputations. Dry skin present bilateral plantar feet. Very small skin fissure present on the right plantar medial foot on the hallux. No signs of  infection.  Vascular: DP 2/4, PT 1/4, CRT less than 3 seconds.  Neruologic: Sensation decreased with Derrel Nip monofilament  Musculoskeletal: Hallux adductus is present. His prior amputations of bilateral second digits. No open lesions.  Gait: Unassisted, Nonantalgic.          Assessment & Plan:  66 year old female left hallux ulceration -Treatment options discussed including all alternatives, risks, and complications -Etiology of symptoms were discussed -Due to the swelling and chronic nature the wound will start oral and arise. Prescribed doxycycline. -Will switch to Silvadene dressing changes daily. Discussed my small amount of Silvadene to the wound. If she has any side effects the medication is stop using this. Her allergy to sulfa is itchiness and this was with oral antibiotics. -Surgical shoe dispensed -ABI from the [place] study was preformed today in the office. On the left it was 1.06 and Right was 1.05 with good waveforms.  -Recommend a small amount of antibiotic ointment on the small skin fissure on the right foot. -Long term would benefit from diabetic shoes given ulceration, history of amputation as well as digital deformity and neuropathy. Paperwork completed today for pre- certification of this. -Monitor for any clinical signs or symptoms of infection and directed to call the office immediately should any occur or go to the ER. -RTC 10 days or sooner if needed  Celesta Gentile, DPM

## 2017-02-17 ENCOUNTER — Other Ambulatory Visit: Payer: Self-pay | Admitting: Internal Medicine

## 2017-02-21 ENCOUNTER — Encounter: Payer: Self-pay | Admitting: Podiatry

## 2017-02-21 ENCOUNTER — Ambulatory Visit (INDEPENDENT_AMBULATORY_CARE_PROVIDER_SITE_OTHER): Payer: BC Managed Care – PPO | Admitting: Podiatry

## 2017-02-21 VITALS — BP 124/60 | HR 80 | Resp 16

## 2017-02-21 DIAGNOSIS — L97529 Non-pressure chronic ulcer of other part of left foot with unspecified severity: Secondary | ICD-10-CM | POA: Diagnosis not present

## 2017-02-21 DIAGNOSIS — E1149 Type 2 diabetes mellitus with other diabetic neurological complication: Secondary | ICD-10-CM

## 2017-02-21 NOTE — Progress Notes (Signed)
Subjective: Ms. [name] presents the office today for follow-up evaluation of a wound to left big toe. She states that she did originally have some lady drainage coming from the area after her last appointment with me but since then after the first couple days she is no longer has any drainage. Denies any pus. Denies any redness or swelling. She has no concerns and she feels that the wound is healing very well. She is inquiring today about diabetic shoes. Denies any systemic complaints such as fevers, chills, nausea, vomiting. No acute changes since last appointment, and no other complaints at this time.   Objective: AAO x3, NAD DP/PT pulses palpable bilaterally, CRT less than 3 seconds Protective sensation decreased with Simms Weinstein monofilament The distal aspect of the left hallux is a hyperkeratotic lesion with a small central fissure, wound present. Today the wound measures 0.3 x 0.1 cm as well as a skin type fissure wound. There is no drainage or pus there is no edema, erythema, ascending cellulitis. There is no fluctuance or crepitus. There is no malodor. There is no clinical signs of infection present. No edema, erythema, increase in warmth to bilateral lower extremities.  No open lesions or pre-ulcerative lesions.  No pain with calf compression, swelling, warmth, erythema  Assessment: Healing ulceration left hallux  Plan: -All treatment options discussed with the patient including all alternatives, risks, complications.  -Today the wound was sharply debrided to the any complications to healthy, granular tissue. Continue with a small amount of antibiotic ointment dressing changes daily. Offloading.  -Monitor for any clinical signs or symptoms of infection and directed to call the office immediately should any occur or go to the ER. -Given insurance that she is not covered. She states that she knows of late that she noted them done. A prescription is provided for her today for  this. -RTC 2 weeks or sooner if needed.  -Patient encouraged to call the office with any questions, concerns, change in symptoms.   Celesta Gentile, DPM

## 2017-03-06 ENCOUNTER — Other Ambulatory Visit: Payer: Self-pay | Admitting: Nurse Practitioner

## 2017-03-07 ENCOUNTER — Ambulatory Visit (INDEPENDENT_AMBULATORY_CARE_PROVIDER_SITE_OTHER): Payer: BC Managed Care – PPO | Admitting: Podiatry

## 2017-03-07 DIAGNOSIS — L97529 Non-pressure chronic ulcer of other part of left foot with unspecified severity: Secondary | ICD-10-CM

## 2017-03-10 NOTE — Progress Notes (Signed)
Subjective: [name] presents the office today for follow-up evaluation the wound to left big toe. She feels that she is doing much better to the wound and she denies any drainage or pus or any redness or swelling. She has been using Silvadene to the wound daily. She has returned to wearing her regular shoe. She has no other concerns today. Denies any systemic complaints such as fevers, chills, nausea, vomiting. No acute changes since last appointment, and no other complaints at this time.   Objective: AAO x3, NAD DP/PT pulses palpable bilaterally, CRT less than 3 seconds Sensation decreased with Simms Weinstein monofilament. The distal aspect of the left hallux continues to be hyperkeratotic lesion with a central wound present underneath area. The wound measures slightly larger to 0.7 x 0.4 cm however there is more hyperkeratotic tissue and appears to be more of an abrasion type lesion. There is no probing, undermining or tunneling. There is no surrounding erythema, ascending cellulitis. There is no fluctuance or crepitus. There is no malodor. No open lesions or pre-ulcerative lesions.  No pain with calf compression, swelling, warmth, erythema  Assessment: Ulceration left hallux without signs of infection  Plan: -All treatment options discussed with the patient including all alternatives, risks, complications.  -I sharply debrided the hyperkeratotic tissue and went to healthy, granular tissue. Continue with Silvadene dressing changes daily. I want her to return to the surgical shoe to help offloading of the area. No signs of infection somewhat hold off on any oral antibiotics. -Monitor for any clinical signs or symptoms of infection and directed to call the office immediately should any occur or go to the ER. -Patient encouraged to call the office with any questions, concerns, change in symptoms.  -RTC in 2 weeks or sooner if any issues are to arise.   Celesta Gentile, DPM

## 2017-03-11 ENCOUNTER — Other Ambulatory Visit: Payer: Self-pay | Admitting: Internal Medicine

## 2017-03-21 ENCOUNTER — Ambulatory Visit: Payer: BC Managed Care – PPO | Admitting: Podiatry

## 2017-04-02 ENCOUNTER — Encounter: Payer: Self-pay | Admitting: Podiatry

## 2017-04-02 ENCOUNTER — Ambulatory Visit: Payer: BC Managed Care – PPO | Admitting: Podiatry

## 2017-04-02 DIAGNOSIS — L97529 Non-pressure chronic ulcer of other part of left foot with unspecified severity: Secondary | ICD-10-CM

## 2017-04-04 NOTE — Progress Notes (Signed)
Subjective: [name] presents the office today for follow-up evaluation the wound to left big toe.  She feels the wound is getting better and she denies any drainage or pus coming from the area she denies any swelling or redness to her foot.  She is asking why the wound is not yet closed.  She is wearing a regular shoe today.  She has no other concerns and she denies any other changes since last appointment.  Denies any systemic complaints such as fevers, chills, nausea, vomiting. No acute changes since last appointment, and no other complaints at this time.   Objective: AAO x3, NAD DP/PT pulses palpable bilaterally, CRT less than 3 seconds Sensation decreased with Simms Weinstein monofilament. The distal aspect of the left hallux continues to be hyperkeratotic lesion with a central wound present underneath area. The wound measures slightly larger to 0.6 x 0.3 cm and appears to be somewhat small and there is a granular wound base and is superficial.  There is no edema, erythema, drainage or pus.  There is no fluctuation or crepitation.  There is no malodor.  There are no other open lesions or pre-ulcerative lesions identified today. No pain with calf compression, swelling, warmth, erythema  Assessment: Ulceration left hallux without signs of infection  Plan: -All treatment options discussed with the patient including all alternatives, risks, complications.  -I sharply debrided the hyperkeratotic tissue and went to healthy, granular tissue.  Continue with Silvadene dressing changes daily.  She is wearing a regular shoe her to continue with the surgical shoe/offloading to help take pressure off the toe.  She has no other wound is not healed.  I think part of this is due to pressure as well as neuropathy and diabetes.  We will continue to monitor closely. -Monitor for any clinical signs or symptoms of infection and directed to call the office immediately should any occur or go to the ER.  Celesta Gentile, DPM

## 2017-04-06 ENCOUNTER — Other Ambulatory Visit: Payer: Self-pay | Admitting: Internal Medicine

## 2017-04-22 ENCOUNTER — Encounter: Payer: Self-pay | Admitting: Podiatry

## 2017-04-22 ENCOUNTER — Ambulatory Visit (INDEPENDENT_AMBULATORY_CARE_PROVIDER_SITE_OTHER): Payer: BC Managed Care – PPO

## 2017-04-22 ENCOUNTER — Ambulatory Visit: Payer: BC Managed Care – PPO | Admitting: Podiatry

## 2017-04-22 ENCOUNTER — Other Ambulatory Visit: Payer: Self-pay

## 2017-04-22 VITALS — BP 167/81 | HR 88 | Temp 99.2°F | Resp 18

## 2017-04-22 DIAGNOSIS — L97529 Non-pressure chronic ulcer of other part of left foot with unspecified severity: Secondary | ICD-10-CM

## 2017-04-22 DIAGNOSIS — L03032 Cellulitis of left toe: Secondary | ICD-10-CM | POA: Diagnosis not present

## 2017-04-22 DIAGNOSIS — R609 Edema, unspecified: Secondary | ICD-10-CM

## 2017-04-22 MED ORDER — CIPROFLOXACIN HCL 500 MG PO TABS: 500.0000 mg | ORAL_TABLET | Freq: Two times a day (BID) | ORAL | 0 refills | Status: DC

## 2017-04-22 MED ORDER — METFORMIN HCL 1000 MG PO TABS: 1000.0000 mg | ORAL_TABLET | Freq: Two times a day (BID) | ORAL | 1 refills | Status: DC

## 2017-04-22 MED ORDER — CLINDAMYCIN HCL 300 MG PO CAPS: 300.0000 mg | ORAL_CAPSULE | Freq: Three times a day (TID) | ORAL | 2 refills | Status: DC

## 2017-04-24 LAB — BASIC METABOLIC PANEL
BUN/Creatinine Ratio: 25 (calc) — ABNORMAL HIGH (ref 6–22)
BUN: 31 mg/dL — ABNORMAL HIGH (ref 7–25)
CHLORIDE: 101 mmol/L (ref 98–110)
CO2: 26 mmol/L (ref 20–32)
CREATININE: 1.25 mg/dL — AB (ref 0.50–0.99)
Calcium: 8.1 mg/dL — ABNORMAL LOW (ref 8.6–10.4)
GLUCOSE: 141 mg/dL — AB (ref 65–99)
Potassium: 4.4 mmol/L (ref 3.5–5.3)
Sodium: 139 mmol/L (ref 135–146)

## 2017-04-24 LAB — CBC WITH DIFFERENTIAL/PLATELET
BASOS PCT: 0.3 %
Basophils Absolute: 35 cells/uL (ref 0–200)
Eosinophils Absolute: 115 cells/uL (ref 15–500)
Eosinophils Relative: 1 %
HCT: 27.6 % — ABNORMAL LOW (ref 35.0–45.0)
Hemoglobin: 9.3 g/dL — ABNORMAL LOW (ref 11.7–15.5)
Lymphs Abs: 1518 cells/uL (ref 850–3900)
MCH: 30.7 pg (ref 27.0–33.0)
MCHC: 33.7 g/dL (ref 32.0–36.0)
MCV: 91.1 fL (ref 80.0–100.0)
MONOS PCT: 8.9 %
MPV: 10 fL (ref 7.5–12.5)
NEUTROS PCT: 76.6 %
Neutro Abs: 8809 cells/uL — ABNORMAL HIGH (ref 1500–7800)
PLATELETS: 248 10*3/uL (ref 140–400)
RBC: 3.03 10*6/uL — AB (ref 3.80–5.10)
RDW: 12.3 % (ref 11.0–15.0)
TOTAL LYMPHOCYTE: 13.2 %
WBC: 11.5 10*3/uL — AB (ref 3.8–10.8)
WBCMIX: 1024 {cells}/uL — AB (ref 200–950)

## 2017-04-24 LAB — C-REACTIVE PROTEIN: CRP: 101.3 mg/L — ABNORMAL HIGH (ref <8.0)

## 2017-04-24 LAB — SEDIMENTATION RATE: Sed Rate: 51 mm/h — ABNORMAL HIGH (ref 0–30)

## 2017-04-25 ENCOUNTER — Ambulatory Visit: Payer: BC Managed Care – PPO | Admitting: Podiatry

## 2017-04-25 NOTE — Progress Notes (Signed)
Subjective: Reizel presents the office today for concerns of increased redness to the left big toe.  She states that over the weekend she had some red streaking but this is actually improved but there is still redness to the left big toe as well as swelling to the left foot.  She states that she is concerned that she is going to end up with another amputation of the toe.  She is currently not taking any antibiotics.  She denies any recent injury or trauma.  She states that overall she feels well. Denies any systemic complaints such as fevers, chills, nausea, vomiting. No acute changes since last appointment, and no other complaints at this time.   Objective: AAO x3, NAD DP/PT pulses palpable bilaterally, CRT less than 3 seconds Ulceration present to left distal hallux which actually appears to have improvement.  There is a hyperkeratotic lesion overlying the area and after debridement the wound is superficial and there is no probing to bone, undermining or tunneling.  There is edema and erythema to the left hallux there is edema to the left foot but there is no ascending cellulitis but there is mild increase in warmth to the foot.  There is no area of fluctuation or crepitation.  I am unable to express any pus. No other open lesions or pre-ulcerative lesions.  No pain with calf compression, swelling, warmth, erythema  Assessment: Left hallux cellulitis with ulceration and concern for osteomyelitis  Plan: -All treatment options discussed with the patient including all alternatives, risks, complications.  -X-rays were obtained and reviewed with the patient.  No soft tissue emphysema is present.  There is questionable cortical changes concerning for osteomyelitis of the hallux. -I sharply debrided the wound today without any complications but I was unable to identify any purulence/drainage today for new culture was obtained today.  This appear to be superficial wound. -We will start clindamycin and  ciprofloxacin -Surgical shoe -Elevation -I ordered blood work today including ESR, CRP, CBC -Discussed the possibility of toe amputation. -Follow-up in 1 week or sooner if any issues are to arise.  Call any questions or concerns meantime. Monitor for any clinical signs or symptoms of infection and directed to call the office immediately should any occur or go to the ER.  Trula Slade DPM

## 2017-05-02 ENCOUNTER — Ambulatory Visit: Payer: BC Managed Care – PPO | Admitting: Podiatry

## 2017-05-02 ENCOUNTER — Telehealth: Payer: Self-pay | Admitting: Podiatry

## 2017-05-02 ENCOUNTER — Ambulatory Visit (INDEPENDENT_AMBULATORY_CARE_PROVIDER_SITE_OTHER): Payer: BC Managed Care – PPO

## 2017-05-02 DIAGNOSIS — L02612 Cutaneous abscess of left foot: Secondary | ICD-10-CM | POA: Diagnosis not present

## 2017-05-02 DIAGNOSIS — R609 Edema, unspecified: Secondary | ICD-10-CM

## 2017-05-02 DIAGNOSIS — M86072 Acute hematogenous osteomyelitis, left ankle and foot: Secondary | ICD-10-CM | POA: Diagnosis not present

## 2017-05-02 DIAGNOSIS — L03032 Cellulitis of left toe: Secondary | ICD-10-CM

## 2017-05-02 MED ORDER — CIPROFLOXACIN HCL 500 MG PO TABS: 500.0000 mg | ORAL_TABLET | Freq: Two times a day (BID) | ORAL | 0 refills | Status: DC

## 2017-05-02 MED ORDER — CLINDAMYCIN HCL 300 MG PO CAPS: 300.0000 mg | ORAL_CAPSULE | Freq: Three times a day (TID) | ORAL | 2 refills | Status: DC

## 2017-05-02 NOTE — Telephone Encounter (Signed)
I just spoke with a nurse requesting my x-rays to take to have a second opinion done. Just cancel all that I requested as I'm going to go ahead and let Dr. Jacqualyn Posey do my surgery on Monday. I trust him. Please tell him I will see him Monday morning at the surgical center. Thank you.

## 2017-05-03 ENCOUNTER — Telehealth: Payer: Self-pay | Admitting: *Deleted

## 2017-05-03 NOTE — Telephone Encounter (Signed)
I informed pt of Dr.Wagoner's offer for her to have a 2nd opinion on Monday with one of the doctors in the office and the surgery reschedule to another day this week if she wanted. Pt states she does want to have the surgery it was her children that wanted the 2nd opinion because she has already lost 2 toes. I told her I would inform Dr. Jacqualyn Posey and he would be ready to perform her surgery Monday. I informed Dr. Jacqualyn Posey.

## 2017-05-03 NOTE — Telephone Encounter (Signed)
Pt states her children would like her to get a second opinion, and she would like a copy of her clinicals and x-rays prepared for her appt with Dr. Seth Bake on 05/03/2017 at noon. Pt states the x-rays will be picked up tomorrow morning, please fax clinicals to [phone].

## 2017-05-03 NOTE — Telephone Encounter (Signed)
If she wants a second opinion she can also see someone else in the practice on Monday push the surgery back a day or so? I think a second opinion is always good. Please let her know today.

## 2017-05-06 ENCOUNTER — Encounter: Payer: Self-pay | Admitting: Podiatry

## 2017-05-06 ENCOUNTER — Telehealth: Payer: Self-pay | Admitting: Podiatry

## 2017-05-06 DIAGNOSIS — M86672 Other chronic osteomyelitis, left ankle and foot: Secondary | ICD-10-CM | POA: Diagnosis not present

## 2017-05-06 MED ORDER — DOXYCYCLINE HYCLATE 100 MG PO TABS: 100.0000 mg | ORAL_TABLET | Freq: Two times a day (BID) | ORAL | 0 refills | Status: DC

## 2017-05-06 MED ORDER — PROMETHAZINE HCL 25 MG PO TABS: 25.0000 mg | ORAL_TABLET | Freq: Three times a day (TID) | ORAL | 0 refills | Status: DC | PRN

## 2017-05-06 MED ORDER — MEPERIDINE HCL 50 MG PO TABS: 50.0000 mg | ORAL_TABLET | Freq: Four times a day (QID) | ORAL | 0 refills | Status: DC | PRN

## 2017-05-06 NOTE — Progress Notes (Signed)
Subjective: [name] presents to the office today for follow-up evaluation left hallux wound, cellulitis.  She is remained in the surgical shoe.  She states that the redness and swelling has improved with the toe is still swollen and red at the tip of the toe.  She has not has any drainage or pus and she denies any red streaks.  Overall she states that she feels well. Denies any systemic complaints such as fevers, chills, nausea, vomiting. No acute changes since last appointment, and no other complaints at this time.   Objective: AAO x3, NAD DP/PT pulses palpable bilaterally, CRT less than 3 seconds Protective sensation decreased with Simms Weinstein monofilament There is edema and erythema to the distal aspect of the left hallux.  Evidence of the ulceration to the distal aspect of the toe which appears to be healed actually.  There is no fluctuance or crepitus.  There is no malodor.  There is decrease edema to the foot as well as decreased warmth of the foot. No edema, erythema, increase in warmth to bilateral lower extremities.  No open lesions or pre-ulcerative lesions.  No pain with calf compression, swelling, warmth, erythema  Assessment: Osteomyelitis left hallux  Plan: -All treatment options discussed with the patient including all alternatives, risks, complications.  -X-rays were obtained and reviewed.  There is cortical destruction of the distal phalanx consistent with osteomyelitis. -Given the continued redness and swelling to the distal left hallux as well as x-ray findings we discussed treatment options.  We discussed limb salvage versus surgical intervention.  We discussed the pros and cons of both of these.  After discussion she wishes to proceed with surgical intervention.  We discussed amputation of the toe.  She was hopeful we cannot debrided the entire toe.  I discussed with her we will do a partial versus total amputation of the toe but will be the majority of the toe.  She  understands this and she wishes to go ahead and proceed with surgery.  We will plan on doing this on Monday.  We discussed this is not a guarantee of resolution of symptoms and she may end up with further surgery or spread of infection. -The incision placement as well as the postoperative course was discussed with the patient. I discussed risks of the surgery which include, but not limited to, infection, bleeding, pain, swelling, need for further surgery, delayed or nonhealing, painful or ugly scar, numbness or sensation changes, over/under correction, recurrence, transfer lesions, further deformity, hardware failure, DVT/PE, loss of toe/foot. Patient understands these risks and wishes to proceed with surgery. The surgical consent was reviewed with the patient all 3 pages were signed. No promises or guarantees were given to the outcome of the procedure. All questions were answered to the best of my ability. Before the surgery the patient was encouraged to call the office if there is any further questions. The surgery will be performed at the [facility] on an outpatient basis. -Continue surgical shoe -Continue antibiotics -Patient encouraged to call the office with any questions, concerns, change in symptoms.   Trula Slade DPM

## 2017-05-06 NOTE — Telephone Encounter (Signed)
Called and spoke to Vanessa Ralphs and confirmed new appointment of Wednesday 08 May 2017 at 11:15 am to see Dr. Jacqualyn Posey for pt's first post-op per Dr. Leigh Aurora request. Mr. Taussig stated they would see [place] on Wednesday at 11:15 am.

## 2017-05-06 NOTE — Progress Notes (Signed)
Pre-operative Note  Patient presents to the [facility] today for surgical intervention of the LEFT foot for hallux amputation due to osteomyelitis/infection. The surgical consent was reviewed with the patient and we discussed the procedure as well as the postoperative course. I again discussed all alternatives, risks, complications. I answered all of their questions to the best of my ability and they wish to proceed with surgery. No promises or guarantees were given as to the outcome of the surgery.   The surgical consent was signed.   Patient is NPO since midnight.  The patient does not have have a history of blood clots or bleeding disorders.   Her husband and pastor are at bedside pre-op and they do not have any questions.  I sent her postop medications electronically to her pharmacy (demeol, phenergan, doxycyline)    No further questions.   [name], [place]

## 2017-05-08 ENCOUNTER — Ambulatory Visit (INDEPENDENT_AMBULATORY_CARE_PROVIDER_SITE_OTHER): Payer: BC Managed Care – PPO | Admitting: Podiatry

## 2017-05-08 ENCOUNTER — Ambulatory Visit (INDEPENDENT_AMBULATORY_CARE_PROVIDER_SITE_OTHER): Payer: BC Managed Care – PPO

## 2017-05-08 DIAGNOSIS — L03032 Cellulitis of left toe: Secondary | ICD-10-CM

## 2017-05-08 DIAGNOSIS — L02612 Cutaneous abscess of left foot: Secondary | ICD-10-CM | POA: Diagnosis not present

## 2017-05-08 NOTE — Progress Notes (Signed)
Subjective: [name] is a 67 y.o. is seen today in office s/p left hallux partial  preformed on 05/06/2017. They state their pain is minimal.  She is continue with antibiotics.  She is wearing a surgical shoe.  Upon removing the bandages in the office today she feels that the swelling is much improved and her foot overall looks much better than what it was prior to the surgery.  Denies any systemic complaints such as fevers, chills, nausea, vomiting. No calf pain, chest pain, shortness of breath.   Objective: General: No acute distress, AAOx3  DP/PT pulses palpable 2/4, CRT < 3 sec to all digits.  Protective sensation intact. Motor function intact.  Left foot: Incision is well coapted without any evidence of dehiscence.  Sutures are intact.  Small area of opening along the lateral part of the left so that the area could drain.  There is some serosanguineous drainage present on the bandage but there is no purulence identified.  There is faint surrounding erythema to the hallux as well as along amputation site of the second digit stump.  There is no fluctuance or crepitus.  There is no malodor.  There is no ascending cellulitis.  Over the erythema there is no warmth to the area.   There is mild edema around the surgical site. There is no pain along the surgical site.  No other areas of tenderness to bilateral lower extremities.  No other open lesions or pre-ulcerative lesions.  No pain with calf compression, swelling, warmth, erythema.   Assessment and Plan:  Status post left hallux amputation, doing well with no complications   -Treatment options discussed including all alternatives, risks, and complications -X-rays were obtained and reviewed.  Status post hallux amputation present.  No evidence of acute fracture. -I also removed 1 of the sutures today on the incision to allow for more draining if needed.  There is no pus identified I was able to probe this area and there is no signs of  abscess or purulence identified. -Area was cleaned.  Betadine wet-to-dry was applied over the wound. -Continue antibiotics -Awaiting bone culture -Elevation -Pain medication as needed she does not take any narcotic pain medication- -Monitor for any clinical signs or symptoms of infection and DVT/PE and directed to call the office immediately should any occur or go to the ER. -Follow-up on Friday for a wound check or sooner if any problems arise. In the meantime, encouraged to call the office with any questions, concerns, change in symptoms.   Celesta Gentile, DPM

## 2017-05-09 ENCOUNTER — Other Ambulatory Visit: Payer: BC Managed Care – PPO

## 2017-05-10 ENCOUNTER — Ambulatory Visit (INDEPENDENT_AMBULATORY_CARE_PROVIDER_SITE_OTHER): Payer: BC Managed Care – PPO | Admitting: Podiatry

## 2017-05-10 DIAGNOSIS — M86072 Acute hematogenous osteomyelitis, left ankle and foot: Secondary | ICD-10-CM

## 2017-05-10 DIAGNOSIS — L02612 Cutaneous abscess of left foot: Secondary | ICD-10-CM

## 2017-05-10 DIAGNOSIS — L03032 Cellulitis of left toe: Secondary | ICD-10-CM

## 2017-05-13 NOTE — Progress Notes (Signed)
Subjective: [name] is a 67 y.o. is seen today in office s/p left hallux partial  preformed on 05/06/2017.  She presents today for follow-up.  She still taken antibiotics incision in the surgical shoe.  She is having no pain.  Overall she states that she is feeling well and she denies any systemic complaints such as fevers, chills, nausea, vomiting. No calf pain, chest pain, shortness of breath.   Objective: General: No acute distress, AAOx3  DP/PT pulses palpable 2/4, CRT < 3 sec to all digits.  Protective sensation intact. Motor function intact.  Left foot: Incision is well coapted without any evidence of dehiscence.  Sutures are intact.  Small area of opening along the lateral part of the left so that the area could drain however this appears to be healing well.  There is no drainage or pus expressed today.  There is minimal bloody drainage on the bandage.  The erythema was completely resolved today and appears much better.  There is no areas of fluctuance or crepitus.  There is no malodor.  There is no ascending cellulitis.  No increased warmth of the foot. No other open lesions or pre-ulcerative lesions.  No pain with calf compression, swelling, warmth, erythema.       Assessment and Plan:  Status post left hallux amputation, doing well with no complications   -Treatment options discussed including all alternatives, risks, and complications -Erythema appears to be much improved.  Finish course of antibiotics.  Awaiting bone culture. -Continue with surgical shoe at all times. -Area was cleaned.  Betadine wet-to-dry was applied over the wound. -Elevation -Pain medication as needed she does not take any narcotic pain medication -Monitor for any clinical signs or symptoms of infection and DVT/PE and directed to call the office immediately should any occur or go to the ER. -Follow-up on Tuesday for a wound check or sooner if any problems arise. In the meantime, encouraged to call the  office with any questions, concerns, change in symptoms.   Celesta Gentile, DPM

## 2017-05-14 ENCOUNTER — Ambulatory Visit (INDEPENDENT_AMBULATORY_CARE_PROVIDER_SITE_OTHER): Payer: BC Managed Care – PPO | Admitting: Podiatry

## 2017-05-14 DIAGNOSIS — M86072 Acute hematogenous osteomyelitis, left ankle and foot: Secondary | ICD-10-CM

## 2017-05-14 DIAGNOSIS — Z9889 Other specified postprocedural states: Secondary | ICD-10-CM

## 2017-05-14 NOTE — Progress Notes (Signed)
Subjective: [name] is a 67 y.o. is seen today in office s/p left hallux partial  preformed on 05/06/2017.  She presents today for follow-up to ensure healing. She states that she is doing well.  She is eager to begin out of the surgical shoe.  She has been try to limit her weightbearing.  She is continue with antibiotics.  Overall she is doing well has no new concerns. Denies any systemic complaints such as fevers, chills, nausea, vomiting. No calf pain, chest pain, shortness of breath.   Objective: General: No acute distress, AAOx3  DP/PT pulses palpable 2/4, CRT < 3 sec to all digits.  Protective sensation intact. Motor function intact.  Left foot: Incision is well coapted without any evidence of dehiscence.  Sutures are intact.  Incision appears to be healing well.  There is one suture more along the middle to the medial aspect which is starting to grow into the skin has some faint redness around the suture but no drainage or pus.  I was able to remove the suture today and there is no signs of infection noted.  There is significantly improved edema and erythema to the area there is no increase in warmth.  Infection appears to be resolved.  No other open lesions or pre-ulcerative lesion identified today. No other open lesions or pre-ulcerative lesions.  No pain with calf compression, swelling, warmth, erythema.   Assessment and Plan:  Status post left hallux amputation, doing well with no complications   -Treatment options discussed including all alternatives, risks, and complications -I did remove one suture today without any complications and the incision is healing well. There is no drainage or pus. There is no ascending cellulitis.  Overall area appears to be doing much better. -A small amount of antibiotic ointment was applied to the incision followed by dressing.  She can continue with this every other day as well. -Finish course of antibiotics.  Awaiting bone culture. -Continue  with surgical shoe at all times. -Elevation -Pain medication as needed she does not take any narcotic pain medication -Monitor for any clinical signs or symptoms of infection and DVT/PE and directed to call the office immediately should any occur or go to the ER. -Follow-up in 1 week for a wound check or sooner if any problems arise. In the meantime, encouraged to call the office with any questions, concerns, change in symptoms.   Celesta Gentile, DPM

## 2017-05-16 ENCOUNTER — Encounter: Payer: Self-pay | Admitting: Podiatry

## 2017-05-16 ENCOUNTER — Encounter: Payer: BC Managed Care – PPO | Admitting: Podiatry

## 2017-05-17 ENCOUNTER — Telehealth: Payer: Self-pay | Admitting: Podiatry

## 2017-05-17 ENCOUNTER — Encounter: Payer: Self-pay | Admitting: Podiatry

## 2017-05-17 NOTE — Telephone Encounter (Signed)
Called Sarah at [facility] and told her to fax what [name] requested on [place] to my fax number of [phone] and as soon as I got it I would take it to [place].

## 2017-05-17 NOTE — Progress Notes (Signed)
Called the patient to discuss bone culture which was no growth. She has no further questions or concern. She is doing well. Will see her next week or sooner if needed.   Trula Slade

## 2017-05-21 ENCOUNTER — Ambulatory Visit (INDEPENDENT_AMBULATORY_CARE_PROVIDER_SITE_OTHER): Payer: BC Managed Care – PPO | Admitting: Podiatry

## 2017-05-21 ENCOUNTER — Encounter: Payer: Self-pay | Admitting: Podiatry

## 2017-05-21 DIAGNOSIS — Z9889 Other specified postprocedural states: Secondary | ICD-10-CM

## 2017-05-21 DIAGNOSIS — M86072 Acute hematogenous osteomyelitis, left ankle and foot: Secondary | ICD-10-CM

## 2017-05-22 NOTE — Progress Notes (Signed)
Subjective: [name] is a 67 y.o. is seen today in office s/p left hallux partial  preformed on 05/06/2017.  She states that she is doing well.  She is change the bandage twice since I last saw her she is been putting antibiotic ointment and a bandage.  She is very eager to get out of the shoe and back into a regular shoe and be healed and return to normal.  She states there is been doing well and she has not noticed any redness or swelling or any drainage or pus.  She is remained in the surgical shoe.  She has finished antibiotics. Denies any systemic complaints such as fevers, chills, nausea, vomiting. No calf pain, chest pain, shortness of breath.   Objective: General: No acute distress, AAOx3  DP/PT pulses palpable 2/4, CRT < 3 sec to all digits.  Protective sensation intact. Motor function intact.  Left foot: Incision is well coapted without any evidence of dehiscence and sutures are intact.  Incision appears to be healing well there is no drainage or pus.  Trace edema there is no erythema or increase in warmth.  There is no ascending cellulitis or evidence of infection noted today.  Overall she is much improved. No other open lesions or pre-ulcerative lesion identified today. No other open lesions or pre-ulcerative lesions.  No pain with calf compression, swelling, warmth, erythema.   Assessment and Plan:  Status post left hallux amputation, doing well with no complications   -Treatment options discussed including all alternatives, risks, and complications -I did remove one suture again today as it appears that there is one suture was healing into the skin without any complications and the incision is healing well. There is no drainage or pus.  No signs of infection and the incision is healing well -Antibiotic ointment was applied followed by dressing.  She can continue this as well every other day.  I did clean her foot today as well. -Continue surgical shoe at all  times -Elevation -Monitor for any clinical signs or symptoms of infection and DVT/PE and directed to call the office immediately should any occur or go to the ER. -Follow-up in 1 week to remove sutures or sooner if any problems arise. In the meantime, encouraged to call the office with any questions, concerns, change in symptoms.   Celesta Gentile, DPM

## 2017-05-27 ENCOUNTER — Encounter: Payer: Self-pay | Admitting: Internal Medicine

## 2017-05-27 ENCOUNTER — Ambulatory Visit: Payer: BC Managed Care – PPO | Admitting: Internal Medicine

## 2017-05-27 VITALS — BP 142/70 | HR 66 | Resp 16 | Ht 64.0 in | Wt 213.0 lb

## 2017-05-27 DIAGNOSIS — IMO0002 Reserved for concepts with insufficient information to code with codable children: Secondary | ICD-10-CM

## 2017-05-27 DIAGNOSIS — E785 Hyperlipidemia, unspecified: Secondary | ICD-10-CM

## 2017-05-27 DIAGNOSIS — E1142 Type 2 diabetes mellitus with diabetic polyneuropathy: Secondary | ICD-10-CM

## 2017-05-27 DIAGNOSIS — E1165 Type 2 diabetes mellitus with hyperglycemia: Secondary | ICD-10-CM

## 2017-05-27 DIAGNOSIS — L97514 Non-pressure chronic ulcer of other part of right foot with necrosis of bone: Secondary | ICD-10-CM

## 2017-05-27 LAB — POCT GLYCOSYLATED HEMOGLOBIN (HGB A1C): HEMOGLOBIN A1C: 7

## 2017-05-27 MED ORDER — JARDIANCE 25 MG PO TABS: ORAL_TABLET | ORAL | 3 refills | Status: DC

## 2017-05-27 MED ORDER — METFORMIN HCL 1000 MG PO TABS: 1000.0000 mg | ORAL_TABLET | Freq: Two times a day (BID) | ORAL | 3 refills | Status: DC

## 2017-05-27 MED ORDER — TRULICITY 1.5 MG/0.5ML ~~LOC~~ SOAJ: SUBCUTANEOUS | 3 refills | Status: DC

## 2017-05-27 NOTE — Patient Instructions (Signed)
Please continue: - Metformin ER 1000 mg 2x a day, with meals - Jardiance 12.5 mg tablet in am - Trulicity 1.5 mg weekly   Please come back for a follow-up appointment in 4 months.

## 2017-05-27 NOTE — Progress Notes (Addendum)
Patient ID: [name], female   DOB: [birth year], 67 y.o.   MRN: [id number]   HPI: [name] is a 67 y.o.-year-old female, initially referred by her PCP, Dr. Harley Alto for f/u for DM2, dx in 1980s, non-insulin-dependent, controlled, with complications (PN - s/p B digit amputations 2/2 osteomyelitis, PAD, foot ulcer). She saw Dr. Elyse Hsu previously. Last visit with me 4 mo ago.  She had asthmatic bronchitis in 03/2017 >> ABx, steroids >> sugars higher.  She is seeing Dr. Jacqualyn Posey for a foot ulcer in [place]. She had amputation of 1/2 of the toe 05/06/2017. Stitches will come out at the end of this week.  Last hemoglobin A1c was: Lab Results  Component Value Date   HGBA1C 6.8 01/24/2017   HGBA1C 6.8 08/07/2016   HGBA1C 6.8 05/10/2016  02/16/2016: 7.1% 06/2015: 6.6%  Pt is on a regimen of: - Metformin ER  1000 mg 2x a day  - Jardiance 25 mg in am >> 12.5 mg 2/2 CKD (decreased 40/9811) - Trulicity 1.5 mg weekly - restarted 02/2016 >> tolerates this well She was on Victoza >> rash. In 09/2016, she had a steroid inj in back >> sugars 200s >> used Novolog for 3 days.   Pt checks her sugars 1-2x d ay: - am: 110-138, 142 >> 120-135, 142 >> 88, 108-140, 176 (surgery) - 2h after b'fast: 117-160 >> 120-142 >> 125-163, 214, 239 - before lunch: 130-140 >> 120-130 >> 135-140, 165 - 2h after lunch: 140 >> 160 >> 130-141 >> 130-145 - before dinner: 134-188, 204 >> 133-135 >> 130-150, 180 - 2h after dinner: 137-144,157 >> 138-150, 171 >> 130-145 - bedtime: 119, 120, 168 >> 135, 140 >> 125-145 - nighttime: n/c Lowest sugar was 120 >> 88; she has hypoglycemia awareness in the 90s. Highest sugar was 280s >> 239.  Glucometer: One Touch Verio  Pt's meals are: - Breakfast: [place] bacon, 2 eggs, toast; oatmeal; or brown rice - Lunch: Tuna, crackers or sandwich, chicken strips, chips, diet Pepsi - Dinner: Meat, veggies, starch  - Snacks: 2-3: 15g carbs (pretzels, chips)  For  exercise, she walks or rides her stationary bike 4 times a week  -+ CKD, last BUN/creatinine:   Lab Results  Component Value Date   BUN 31 (H) 04/23/2017   BUN 32 (H) 05/10/2016   CREATININE 1.25 (H) 04/23/2017   CREATININE 1.32 (H) 05/10/2016  08/29/2016: ACR 76.7 02/16/2016: 42/1.34 (GFR 40), Glu 213  12/26/2015: 25/1.23 (GFR 44), Glu 153 On lisinopril. -+ HL; last set of lipids: 08/29/2016: 87/132/32/30 02/16/2016: 128/278/33/39  03/02/2014: 114/175/32/48 No results found for: CHOL, HDL, LDLCALC, LDLDIRECT, TRIG, CHOLHDL On Zocor. - last eye exam was in 10/2006: No DR (Dr. Gershon Crane). Cataracts worse. -She does have numbness and tingling in her feet.  Sees podiatry (Dr. Jacqualyn Posey)  She also has a history of HTN, GERD, anemia.  ROS: Constitutional: + weight gain, + fatigue, no subjective hyperthermia, + subjective hypothermia Eyes: no blurry vision, no xerophthalmia ENT: no sore throat, no nodules palpated in throat, no dysphagia, no odynophagia, no hoarseness Cardiovascular: no CP/no SOB/no palpitations/no leg swelling Respiratory: no cough/no SOB/no wheezing Gastrointestinal: no N/no V/no D/no C/no acid reflux Musculoskeletal: + muscle aches/+ joint aches Skin: no rashes, + hair loss Neurological: no tremors/+ numbness/+ tingling/no dizziness  I reviewed pt's medications, allergies, PMH, social hx, family hx, and changes were documented in the history of present illness. Otherwise, unchanged from my initial visit note.  Past Medical History:  Diagnosis Date  .  Anemia in chronic renal disease 06/05/2011  . Anxiety    takes Ativan tid  . Asthma    has an inhaler prn  . Cough   . DDD (degenerative disc disease), cervical 06/05/2011  . DDD (degenerative disc disease), lumbosacral 06/05/2011  . Depression    takes Cymbalta daily  . Diabetes mellitus    takes Amaryl and Metformin daily  . Diabetic neuropathy ([place])   . Diverticulosis   . Dizziness   . GERD (gastroesophageal  reflux disease)    takes Protonix prn  . Headache(784.0)    occasionally  . History of bronchitis    last time 77yrs ago  . History of colon polyps   . History of MRSA infection   . HTN (hypertension) 06/05/2011   takes Prinizide daily  . Hyperlipidemia    takes Zocor daily  . Insomnia    not on any meds at present time  . Osteoarthritis   . PONV (postoperative nausea and vomiting)    hard to wake up  . Psoriasis 06/05/2011   on legs and uses cream prn  . Seasonal allergies    but doesn't take any meds  . Sleep apnea    uses CPAP  . Tubular adenoma of colon    Past Surgical History:  Procedure Laterality Date  . ABDOMINAL HYSTERECTOMY  1982 and 1984  . AMPUTATION  02/12/2012   Procedure: AMPUTATION RAY;  Surgeon: Wylene Simmer, MD;  Location: [facility];  Service: Orthopedics;  Laterality: Right;  RIGHT 2ND TOE AMPUTATION   . BACK SURGERY     x 3  . CARDIAC CATHETERIZATION     08/09/04: 50% mid AV groove CX lesion, NL LM, LAD, Ramus, EF > 60% (Dr. Gwenlyn Found)  . CARPAL TUNNEL RELEASE Bilateral 2010  . CERVICAL SPINE SURGERY    . CHOLECYSTECTOMY  1977  . COLONOSCOPY    . DILATION AND CURETTAGE OF UTERUS    . ELBOW SURGERY     left d/t tendonitis  . ESOPHAGOGASTRODUODENOSCOPY    . LUMBAR WOUND DEBRIDEMENT N/A 07/15/2012   Procedure: I & D of Lumbar Wound: Possible Repair of CSF Leak;  Surgeon: Elaina Hoops, MD;  Location: [facility];  Service: Neurosurgery;  Laterality: N/A;  I & D of Lumbar Wound: Possible Repair of CSF Leak placement lumbar drain  . right knee arthroscopy     x 2  . SHOULDER ARTHROSCOPY Left   . TOE AMPUTATION Left    Social History   Social History  . Marital status: Married    Spouse name: N/A  . Number of children: 2   Occupational History  . Disabled    Social History Main Topics  . Smoking status: Never Smoker  . Smokeless tobacco: Never Used     Comment: never used tobacco  . Alcohol use No  . Drug use: No   Current Outpatient Medications on  File Prior to Visit  Medication Sig Dispense Refill  . albuterol (PROVENTIL HFA;VENTOLIN HFA) 108 (90 BASE) MCG/ACT inhaler Inhale 1 puff into the lungs as needed for shortness of breath.     Marland Kitchen aspirin 81 MG tablet Take 81 mg by mouth daily.    . ciprofloxacin (CIPRO) 500 MG tablet Take 1 tablet (500 mg total) by mouth 2 (two) times daily. 20 tablet 0  . clindamycin (CLEOCIN) 300 MG capsule Take 1 capsule (300 mg total) by mouth 3 (three) times daily. 30 capsule 2  . clobetasol (OLUX) 0.05 % topical foam  Apply 1 application topically daily as needed (apply to head for dry scalp).     . COLLAGEN-VITAMIN C PO Take by mouth daily.    . cyclobenzaprine (FLEXERIL) 10 MG tablet Take 10 mg by mouth as needed for muscle spasms.    . diphenhydrAMINE (BENADRYL) 50 MG tablet Take as directed 1 tablet 0  . doxycycline (VIBRA-TABS) 100 MG tablet Take 1 tablet (100 mg total) by mouth 2 (two) times daily. 20 tablet 0  . DULoxetine (CYMBALTA) 30 MG capsule Take 90 mg by mouth daily.  5  . empagliflozin (JARDIANCE) 25 MG TABS tablet TAKE 1 TABLET EVERY DAY 30 tablet 2  . fluconazole (DIFLUCAN) 150 MG tablet Take 1 tablet (150 mg total) by mouth daily. (Patient taking differently: Take 150 mg by mouth as needed. ) 1 tablet 1  . JARDIANCE 25 MG TABS tablet Take half a tablet every day. 30 tablet 3  . lisinopril-hydrochlorothiazide (PRINZIDE,ZESTORETIC) 20-25 MG per tablet Take 1 tablet by mouth daily.    Marland Kitchen LORazepam (ATIVAN) 1 MG tablet Take 1 mg by mouth daily as needed.     . meperidine (DEMEROL) 50 MG tablet Take 1 tablet (50 mg total) by mouth every 6 (six) hours as needed. 20 tablet 0  . metFORMIN (GLUCOPHAGE) 1000 MG tablet Take 1 tablet (1,000 mg total) by mouth 2 (two) times daily with a meal. 180 tablet 1  . metoprolol succinate (TOPROL-XL) 25 MG 24 hr tablet     . Multiple Vitamin (MULTI-VITAMIN DAILY PO) Take 1 tablet by mouth daily.     . NON FORMULARY Phazyme 1 daily    . ondansetron (ZOFRAN) 8 MG  tablet TAKE 1 TABLET BY MOUTH 3 TIMES A DAY AS NEEDED NAUSEA (INS PAYS #18/21 DAYS)  0  . ONETOUCH VERIO test strip USE TO TEST BLOOD SUGARS 2 TIMES DAILY (DX: E11.65) 100 each 2  . pantoprazole (PROTONIX) 40 MG tablet Take 40 mg by mouth daily as needed (for reflux). For acid reflux    . Probiotic Product (PROBIOTIC DAILY PO) Take by mouth. Probiotic plus daily    . promethazine (PHENERGAN) 25 MG tablet Take 1 tablet (25 mg total) by mouth every 8 (eight) hours as needed for nausea or vomiting. 20 tablet 0  . ranitidine (ZANTAC) 150 MG tablet Take 1 tablet (150 mg total) by mouth every evening. As needed. 30 tablet 2  . silver sulfADIAZINE (SILVADENE) 1 % cream Apply 1 application topically daily. 50 g 0  . simvastatin (ZOCOR) 40 MG tablet Take 40 mg by mouth at bedtime.     . traMADol (ULTRAM) 50 MG tablet TAKE 1 OR 2 TABLETS BY MOUTH EVERY 6 TO 8 HOURS AS NEEDED FOR PAIN  0  . TRULICITY 1.5 VQ/2.5ZD SOPN INJECT ONCE A WEEK 2 pen 5   No current facility-administered medications on file prior to visit.    Allergies  Allergen Reactions  . Victoza [Liraglutide]     Severe rash and hives.  . Dilaudid [Hydromorphone]     Rash and swelling  . Ultram [Tramadol] Rash  . Betadine [Povidone Iodine] Other (See Comments)    Skin peels  . Bydureon [Exenatide] Diarrhea and Nausea And Vomiting  . Codeine     Severe headaches  . Contrast Media [Iodinated Diagnostic Agents] Nausea And Vomiting  . Hydrocodone Other (See Comments)    headache  . Iodine Hives    Iodine hives  . Januvia [Sitagliptin] Diarrhea  . Morphine And Related Nausea And  Vomiting  . Percocet [Oxycodone-Acetaminophen] Other (See Comments)    Headache   . Septra [Sulfamethoxazole-Trimethoprim]     rash  . Sulfa Antibiotics Itching  . Ultram [Tramadol Hcl]   . Valium Other (See Comments)    headache  . Vicodin [Hydrocodone-Acetaminophen] Other (See Comments)    headache  . Amoxicillin Hives and Rash  . Darvocet  [Propoxyphene N-Acetaminophen] Rash and Other (See Comments)    headache  . Fentanyl And Related Rash  . Wellbutrin [Bupropion Hcl] Rash   Family History  Problem Relation Age of Onset  . Colon cancer Brother 76  . Hypertension Mother        died from infection s/p hip surgery  . Heart attack Father   . Colon cancer Maternal Grandfather   . Stomach cancer Neg Hx   . Esophageal cancer Neg Hx   . Rectal cancer Neg Hx   . Liver cancer Neg Hx     PE: BP (!) 142/70 (BP Location: Right Arm, Patient Position: Sitting, Cuff Size: Large)   Pulse 66   Resp 16   Ht 5\' 4"  (1.626 m)   Wt 213 lb (96.6 kg)   SpO2 97%   BMI 36.56 kg/m  Wt Readings from Last 3 Encounters:  05/27/17 213 lb (96.6 kg)  01/24/17 212 lb (96.2 kg)  08/17/16 210 lb (95.3 kg)   Constitutional: overweight, in NAD Eyes: Anisocoria, right pupil larger and less reactive to light, EOMI, no exophthalmos ENT: moist mucous membranes, no thyromegaly, no cervical lymphadenopathy Cardiovascular: RRR, No MRG Respiratory: CTA B Gastrointestinal: abdomen soft, NT, ND, BS+ Musculoskeletal: no deformities, strength intact in all 4, L foot in boot Skin: moist, warm, no rashes Neurological: no tremor with outstretched hands, DTR normal in all 4  ASSESSMENT: 1. DM2, non-insulin-dependent, controlled, with complications: - PN - s/p B 2nd toe amputations 2/2 osteomyelitis - 2013 and 2015; 1/2 hallux 04/2017 - PAD - foot ulcer  2. HL  3. Foot ulcer  PLAN:  1. Patient with long-standing, previously uncontrolled, diabetes, now with better control in the last year, with last HbA1c at 6.8%.  She is on oral antidiabetic regimen with metformin and Jardiance and also Trulicity.  She has a decreased GFR, so we are using half of a Jardiance 25 mg tablet.  She is interesting in coming off this medication, if possible.  - However, at this visit, sugars are slightly higher after her asthmatic bronchitis (with prednisone and antibiotic  courses) in 03/2017 and then osteomyelitis and partial amputation of L great toe in 04/2017.  She is now healing and her sugars are improving, especially in the last 1.5 weeks.  No changes needed in her medication regimen, but we cannot stop Jardiance quite now.  - I suggested to:   Patient Instructions  Please continue: - Metformin ER 1000 mg 2x a day, with meals - Jardiance 12.5 mg tablet in am - Trulicity 1.5 mg weekly   Please come back for a follow-up appointment in 4 months.  - today, HbA1c is 7.0% (slightly higher) - continue checking sugars at different times of the day - check 1x a day, rotating checks - advised for yearly eye exams >> she is UTD - Return to clinic in 4 mo with sugar log   2. HL - Latest lipid panel available for review is from 08/2016 and all cholesterol fractions are at goal - Continues Zocor without side effects  3. Foot ulcer - osteomyelitis - sees podiatry- had 1/2 L  hallux amputated >> healing   Philemon Kingdom, MD PhD [facility] Endocrinology

## 2017-05-31 ENCOUNTER — Encounter: Payer: Self-pay | Admitting: Podiatry

## 2017-05-31 ENCOUNTER — Ambulatory Visit (INDEPENDENT_AMBULATORY_CARE_PROVIDER_SITE_OTHER): Payer: BC Managed Care – PPO | Admitting: Podiatry

## 2017-05-31 DIAGNOSIS — Z9889 Other specified postprocedural states: Secondary | ICD-10-CM

## 2017-05-31 DIAGNOSIS — M86072 Acute hematogenous osteomyelitis, left ankle and foot: Secondary | ICD-10-CM

## 2017-06-02 NOTE — Progress Notes (Signed)
Subjective: [name] is a 67 y.o. is seen today in office s/p left hallux partial  preformed on 05/06/2017. She presents today for suture removal. She is still in the surgical shoe. She is eager to get back into her regular shoe. He has been doing well and denies any increase in redness or swelling to her foot and she has not had any drainage or pus coming from the incision. She is not on antibiotics at this point. Denies any systemic complaints such as fevers, chills, nausea, vomiting. No calf pain, chest pain, shortness of breath.   Objective: General: No acute distress, AAOx3  DP/PT pulses palpable 2/4, CRT < 3 sec to all digits.  Protective sensation intact. Motor function intact.  Left foot: Incision is well coapted without any evidence of dehiscence and sutures are intact and the incision appears to be healing well. Minimal edema. No surrounding erythema or ascending cellulitis and no further evidence of infection noted today.  No other open lesions or pre-ulcerative lesion identified today. No other open lesions or pre-ulcerative lesions.  No pain with calf compression, swelling, warmth, erythema.   Assessment and Plan:  Status post left hallux amputation, doing well with no complications   -Treatment options discussed including all alternatives, risks, and complications -Sutures removed today without complication. Incision is healing well. Antibiotic ointment was applied followed by a bandage. She can start to shower tomorrow but monitor incision closely.  -Continue surgical shoe but over the next week she can start to try to go back into her diabetic shoe but make this a gradual transition and monitor the foot daily for any irritation. Since there has been a change we will go ahead and start the pre-certifiction for new diabetic shoes. Will get her scheduled to see Betha.  -Elevation -Monitor for any clinical signs or symptoms of infection and DVT/PE and directed to call the office  immediately should any occur or go to the ER. -Follow-upas scheduled or sooner if any problems arise. In the meantime, encouraged to call the office with any questions, concerns, change in symptoms.   Celesta Gentile, DPM

## 2017-06-06 ENCOUNTER — Telehealth: Payer: Self-pay | Admitting: Podiatry

## 2017-06-06 NOTE — Telephone Encounter (Signed)
Left message for pt to call to discuss orthotic coverage and to schedule an appt with Betha to be cast for orthotics.

## 2017-06-14 ENCOUNTER — Encounter: Payer: Self-pay | Admitting: Podiatry

## 2017-06-14 ENCOUNTER — Ambulatory Visit (INDEPENDENT_AMBULATORY_CARE_PROVIDER_SITE_OTHER): Payer: BC Managed Care – PPO

## 2017-06-14 ENCOUNTER — Ambulatory Visit (INDEPENDENT_AMBULATORY_CARE_PROVIDER_SITE_OTHER): Payer: BC Managed Care – PPO | Admitting: Podiatry

## 2017-06-14 DIAGNOSIS — E1149 Type 2 diabetes mellitus with other diabetic neurological complication: Secondary | ICD-10-CM

## 2017-06-14 DIAGNOSIS — Z9889 Other specified postprocedural states: Secondary | ICD-10-CM | POA: Diagnosis not present

## 2017-06-14 DIAGNOSIS — M86072 Acute hematogenous osteomyelitis, left ankle and foot: Secondary | ICD-10-CM

## 2017-06-14 DIAGNOSIS — M2041 Other hammer toe(s) (acquired), right foot: Secondary | ICD-10-CM

## 2017-06-14 DIAGNOSIS — M2042 Other hammer toe(s) (acquired), left foot: Secondary | ICD-10-CM

## 2017-06-18 ENCOUNTER — Ambulatory Visit (INDEPENDENT_AMBULATORY_CARE_PROVIDER_SITE_OTHER): Payer: BC Managed Care – PPO | Admitting: Orthotics

## 2017-06-18 DIAGNOSIS — E1149 Type 2 diabetes mellitus with other diabetic neurological complication: Secondary | ICD-10-CM

## 2017-06-18 DIAGNOSIS — Z9889 Other specified postprocedural states: Secondary | ICD-10-CM

## 2017-06-18 DIAGNOSIS — M2012 Hallux valgus (acquired), left foot: Secondary | ICD-10-CM

## 2017-06-18 DIAGNOSIS — Z899 Acquired absence of limb, unspecified: Secondary | ICD-10-CM

## 2017-06-18 DIAGNOSIS — M2011 Hallux valgus (acquired), right foot: Secondary | ICD-10-CM

## 2017-06-18 NOTE — Progress Notes (Signed)
Patient seen today for custom accommodative inserts to address conditions secondary to DM2.  Patient presents with neuropathy and hx of toe amputation (5 R, and partial 1 L).  LW accomodative w/ P-cell order from [facility] via foam box cast.

## 2017-06-19 NOTE — Progress Notes (Signed)
Subjective: [name] is a 67 y.o. is seen today in office s/p left hallux partial  preformed on 05/06/2017.  She states that she is doing well.  She says the incision is healed and she has not had no swelling or redness and she denies open sores. Denies any systemic complaints such as fevers, chills, nausea, vomiting. No calf pain, chest pain, shortness of breath.   Objective: General: No acute distress, AAOx3  DP/PT pulses palpable 2/4, CRT < 3 sec to all digits.  Protective sensation intact. Motor function intact.  Left foot: Incision is well coapted without any evidence of dehiscence the scar is well formed.  There is no signs of infection to the amputation site appears to be completely healed at this time.  Hammertoe contractures are present in the third through fifth digits with minimal hyperkeratotic tissue present the dorsal aspect of the third toe.  No erythema or edema to the digits.  No other open lesions or pre-ulcerative lesions identified bilaterally.  No other open lesions or pre-ulcerative lesions.  No pain with calf compression, swelling, warmth, erythema.   Assessment and Plan:  Status post left hallux amputation, doing well with no complications; hammertoe contracture  -Treatment options discussed including all alternatives, risks, and complications -Scar is well formed in the prior surgical site to the hallux amputation.  She is to be fitted for new insert for shoes on Tuesday with Betha.  We also did take pressure off the hammertoes.  We will do a toe crest and return to see if it can help straighten out the toes and take pressure off the lesser digits to help prevent ulceration and infection.  Otherwise she is doing well.  I did debride the nails in the left foot without any complications or bleeding. -Monitor for any clinical signs or symptoms of infection and directed to call the office immediately should any occur or go to the ER.  Trula Slade DPM

## 2017-07-19 ENCOUNTER — Encounter: Payer: BC Managed Care – PPO | Admitting: Orthotics

## 2017-07-23 ENCOUNTER — Ambulatory Visit: Payer: BC Managed Care – PPO | Admitting: Orthotics

## 2017-07-23 DIAGNOSIS — R609 Edema, unspecified: Secondary | ICD-10-CM

## 2017-07-23 DIAGNOSIS — E1149 Type 2 diabetes mellitus with other diabetic neurological complication: Secondary | ICD-10-CM

## 2017-07-23 DIAGNOSIS — M2042 Other hammer toe(s) (acquired), left foot: Secondary | ICD-10-CM

## 2017-07-23 DIAGNOSIS — M2041 Other hammer toe(s) (acquired), right foot: Secondary | ICD-10-CM

## 2017-07-23 DIAGNOSIS — Z899 Acquired absence of limb, unspecified: Secondary | ICD-10-CM

## 2017-07-23 NOTE — Progress Notes (Signed)
Patient came in today to pick up custom made foot orthotics.  The goals were accomplished and the patient reported no dissatisfaction with said orthotics.  Patient was advised of breakin period and how to report any issues. 

## 2017-08-16 ENCOUNTER — Ambulatory Visit (INDEPENDENT_AMBULATORY_CARE_PROVIDER_SITE_OTHER): Payer: BC Managed Care – PPO | Admitting: Podiatry

## 2017-08-16 ENCOUNTER — Encounter: Payer: Self-pay | Admitting: Podiatry

## 2017-08-16 ENCOUNTER — Ambulatory Visit (INDEPENDENT_AMBULATORY_CARE_PROVIDER_SITE_OTHER): Payer: BC Managed Care – PPO

## 2017-08-16 DIAGNOSIS — E1149 Type 2 diabetes mellitus with other diabetic neurological complication: Secondary | ICD-10-CM

## 2017-08-16 DIAGNOSIS — B351 Tinea unguium: Secondary | ICD-10-CM | POA: Diagnosis not present

## 2017-08-16 DIAGNOSIS — Z899 Acquired absence of limb, unspecified: Secondary | ICD-10-CM | POA: Diagnosis not present

## 2017-08-16 NOTE — Patient Instructions (Signed)

## 2017-08-19 NOTE — Progress Notes (Signed)
Subjective: 67 year old female presents the office today for follow-up evaluation status post partial left hallux amputation performed on May 06, 2017.  She states that she is doing well she wants to be discharged.  Denies any new concerns and she denies any changes since I last saw her.  Denies any systemic complaints such as fevers, chills, nausea, vomiting. No acute changes since last appointment, and no other complaints at this time.   Objective: AAO x3, NAD DP/PT pulses palpable bilaterally, CRT less than 3 seconds Incision from the prior surgery is well-healed.  There is no open lesions or pre-ulcerative lesions.  There is no edema, erythema, increase in warmth and there is no clinical signs of infection present.  Nails appear to be mildly hypertrophic, dystrophic, discolored and elongated.  No swelling redness or drainage or any clinical signs of infection present.  No open lesions or pre-ulcerative lesions.  No pain with calf compression, swelling, warmth, erythema  Assessment: Status post hallux amputation, onychomycosis   Plan: -All treatment options discussed with the patient including all alternatives, risks, complications.  -At this point she is doing well from the surgery is well-healed.  We discussed the importance of daily foot inspection help prevent recurrence.  I did sharply debride the nails x8 without any complications or bleeding.  I have recommended follow-up at least every 3-6 months for diabetic foot care and assessment for to call sooner if any issues are to arise. -Patient encouraged to call the office with any questions, concerns, change in symptoms.   Trula Slade DPM

## 2017-09-04 ENCOUNTER — Telehealth: Payer: Self-pay | Admitting: Internal Medicine

## 2017-09-04 NOTE — Telephone Encounter (Signed)
Patient need a refill for [facility] [phone] [address], [place] [address] [place] [phone] (Phone) [phone] (Fax)

## 2017-09-05 ENCOUNTER — Other Ambulatory Visit: Payer: Self-pay

## 2017-09-05 MED ORDER — TRULICITY 1.5 MG/0.5ML ~~LOC~~ SOAJ: SUBCUTANEOUS | 3 refills | Status: DC

## 2017-09-05 NOTE — Telephone Encounter (Signed)
This has been ordered 

## 2017-09-06 ENCOUNTER — Telehealth: Payer: Self-pay | Admitting: Internal Medicine

## 2017-09-06 NOTE — Telephone Encounter (Signed)
Patient stated the pharmacy haven't received her medication Trulicity, please resend  [facility] [phone] [address], [place] [phone] (Phone) [phone] (Fax)

## 2017-09-10 MED ORDER — TRULICITY 1.5 MG/0.5ML ~~LOC~~ SOAJ: SUBCUTANEOUS | 3 refills | Status: DC

## 2017-09-10 NOTE — Telephone Encounter (Signed)
Called pt to apologize b/c Rx was sent to the incorrect pharmacy. Redirected to pharmacy as requested. No answer.

## 2017-09-24 ENCOUNTER — Encounter: Payer: Self-pay | Admitting: Internal Medicine

## 2017-09-24 ENCOUNTER — Ambulatory Visit: Payer: BC Managed Care – PPO | Admitting: Internal Medicine

## 2017-09-24 VITALS — BP 132/72 | HR 81 | Ht 64.0 in | Wt 218.8 lb

## 2017-09-24 DIAGNOSIS — E785 Hyperlipidemia, unspecified: Secondary | ICD-10-CM | POA: Diagnosis not present

## 2017-09-24 DIAGNOSIS — E1142 Type 2 diabetes mellitus with diabetic polyneuropathy: Secondary | ICD-10-CM | POA: Diagnosis not present

## 2017-09-24 DIAGNOSIS — E1165 Type 2 diabetes mellitus with hyperglycemia: Secondary | ICD-10-CM

## 2017-09-24 DIAGNOSIS — L97514 Non-pressure chronic ulcer of other part of right foot with necrosis of bone: Secondary | ICD-10-CM | POA: Diagnosis not present

## 2017-09-24 DIAGNOSIS — IMO0002 Reserved for concepts with insufficient information to code with codable children: Secondary | ICD-10-CM

## 2017-09-24 LAB — POCT GLYCOSYLATED HEMOGLOBIN (HGB A1C): Hemoglobin A1C: 7.3 % — AB (ref 4.0–5.6)

## 2017-09-24 MED ORDER — JARDIANCE 25 MG PO TABS: ORAL_TABLET | ORAL | 3 refills | Status: DC

## 2017-09-24 NOTE — Patient Instructions (Addendum)
Please continue: - Metformin ER 1000 mg 2x a day, with meals - Trulicity 1.5 mg weekly   Please increase: - Jardiance to 25 mg daily before b'fast  Please come back for a follow-up appointment in 4 months.

## 2017-09-24 NOTE — Progress Notes (Signed)
Patient ID: [name], female   DOB: 11/12/1950, 67 y.o.   MRN: [id number]   HPI: [name] is a 67 y.o.-year-old female, initially referred by her PCP, Dr. Harley Alto for f/u for DM2, dx in 1980s, non-insulin-dependent, controlled, with complications (PN - s/p B digit amputations 2/2 osteomyelitis, PAD, foot ulcer). She saw Dr. Elyse Hsu previously. Last visit with me 4 mo ago.  She had amputation of 1/2 of the left hallux 05/06/2017.  This is now healed.  She started Humira (for psoriasis) but did not tolerate this >> stopped.  She started PT for her back pain. Also dry needling.   She is fighting seasonal allergies.  She also had diverticulitis.  Last hemoglobin A1c was: Lab Results  Component Value Date   HGBA1C 7.0 05/27/2017   HGBA1C 6.8 01/24/2017   HGBA1C 6.8 08/07/2016  02/16/2016: 7.1% 06/2015: 6.6%  Pt is on a regimen of: - Metformin ER  1000 mg 2x a day  - Jardiance 25 mg in am >> 12.5 mg daily due to CKD (decreased 54/2706) - Trulicity 1.5 mg weekly  She was on Victoza >> rash. In 09/2016, she had a steroid inj in back >> sugars 200s >> used Novolog for 3 days.   Pt checks her sugars 1-2X a day - am: 120-135, 142 >> 88, 108-140, 176 (surgery) >> 113, 125-157 - 2h after b'fast: 120-142 >> 125-163, 214, 239 >> 137 - before lunch: 120-130 >> 135-140, 165 >> 115-140 - 2h after lunch: 160 >> 130-141 >> 130-145 >> 127-140 - before dinner: 133-135 >> 130-150, 180 >> 127-140 - 2h after dinner:  138-150, 171 >> 130-145 >> 100-147, 179 - bedtime: 119, 120, 168 >> 135, 140 >> 125-145 >> 150, 165 - nighttime: n/c Lowest sugar was 120 >> 88 >> 100; she has hypoglycemia awareness in the 90s. Highest sugar was 280s >> 239 >> 165.  Glucometer: One Touch Verio  Pt's meals are: - Breakfast: [place] bacon, 2 eggs, toast; oatmeal; or brown rice - Lunch: Tuna, crackers or sandwich, chicken strips, chips, diet Pepsi - Dinner: Meat, veggies, starch  - Snacks: 2-3:  15g carbs (pretzels, chips)  For exercise, she walks or rides her stationary bike 4 times a week.  -+ CKD, last BUN/creatinine:  Lab Results  Component Value Date   BUN 31 (H) 04/23/2017   BUN 32 (H) 05/10/2016   CREATININE 1.25 (H) 04/23/2017   CREATININE 1.32 (H) 05/10/2016  08/29/2016: ACR 76.7 02/16/2016: 42/1.34 (GFR 40), Glu 213  12/26/2015: 25/1.23 (GFR 44), Glu 153 On  lisinopril. -+ HL; last set of lipids: 08/29/2016: 87/132/32/30 02/16/2016: 128/278/33/39  03/02/2014: 114/175/32/48 No results found for: CHOL, HDL, LDLCALC, LDLDIRECT, TRIG, CHOLHDL On Zocor. - last eye exam was in 10/2016: No DR (Dr. Gershon Crane).  Cataracts have worsened. - + numbness and tingling in her feet.  Sees podiatry (Dr. Jacqualyn Posey)  She also has a history of HTN, GERD, anemia.  ROS: Constitutional: + Fatigue, + weight gain, + fever/chills Eyes: no blurry vision, no xerophthalmia ENT: + sore throat, no nodules palpated in throat, no dysphagia, no odynophagia, no hoarseness Cardiovascular: no CP/+ SOB/no palpitations/no leg swelling Respiratory: + cough/+ SOB/no wheezing Gastrointestinal: no N/no V/+ D/no C/+ acid reflux Musculoskeletal: + muscle aches/+ joint aches Skin: no rashes, + hair loss, + stretch marks Neurological: no tremors/+ numbness/+ tingling/no dizziness, + headaches  I reviewed pt's medications, allergies, PMH, social hx, family hx, and changes were documented in the history of present illness. Otherwise, unchanged  from my initial visit note.  Past Medical History:  Diagnosis Date  . Anemia in chronic renal disease 06/05/2011  . Anxiety    takes Ativan tid  . Asthma    has an inhaler prn  . Cough   . DDD (degenerative disc disease), cervical 06/05/2011  . DDD (degenerative disc disease), lumbosacral 06/05/2011  . Depression    takes Cymbalta daily  . Diabetes mellitus    takes Amaryl and Metformin daily  . Diabetic neuropathy ([place])   . Diverticulosis   . Dizziness   .  GERD (gastroesophageal reflux disease)    takes Protonix prn  . Headache(784.0)    occasionally  . History of bronchitis    last time 65yrs ago  . History of colon polyps   . History of MRSA infection   . HTN (hypertension) 06/05/2011   takes Prinizide daily  . Hyperlipidemia    takes Zocor daily  . Insomnia    not on any meds at present time  . Osteoarthritis   . PONV (postoperative nausea and vomiting)    hard to wake up  . Psoriasis 06/05/2011   on legs and uses cream prn  . Seasonal allergies    but doesn't take any meds  . Sleep apnea    uses CPAP  . Tubular adenoma of colon    Past Surgical History:  Procedure Laterality Date  . ABDOMINAL HYSTERECTOMY  1982 and 1984  . AMPUTATION  02/12/2012   Procedure: AMPUTATION RAY;  Surgeon: Wylene Simmer, MD;  Location: [facility];  Service: Orthopedics;  Laterality: Right;  RIGHT 2ND TOE AMPUTATION   . BACK SURGERY     x 3  . CARDIAC CATHETERIZATION     08/09/04: 50% mid AV groove CX lesion, NL LM, LAD, Ramus, EF > 60% (Dr. Gwenlyn Found)  . CARPAL TUNNEL RELEASE Bilateral 2010  . CERVICAL SPINE SURGERY    . CHOLECYSTECTOMY  1977  . COLONOSCOPY    . DILATION AND CURETTAGE OF UTERUS    . ELBOW SURGERY     left d/t tendonitis  . ESOPHAGOGASTRODUODENOSCOPY    . LUMBAR WOUND DEBRIDEMENT N/A 07/15/2012   Procedure: I & D of Lumbar Wound: Possible Repair of CSF Leak;  Surgeon: Elaina Hoops, MD;  Location: [facility];  Service: Neurosurgery;  Laterality: N/A;  I & D of Lumbar Wound: Possible Repair of CSF Leak placement lumbar drain  . right knee arthroscopy     x 2  . SHOULDER ARTHROSCOPY Left   . TOE AMPUTATION Left    Social History   Social History  . Marital status: Married    Spouse name: N/A  . Number of children: 2   Occupational History  . Disabled    Social History Main Topics  . Smoking status: Never Smoker  . Smokeless tobacco: Never Used     Comment: never used tobacco  . Alcohol use No  . Drug use: No   Current  Outpatient Medications on File Prior to Visit  Medication Sig Dispense Refill  . albuterol (PROVENTIL HFA;VENTOLIN HFA) 108 (90 BASE) MCG/ACT inhaler Inhale 1 puff into the lungs as needed for shortness of breath.     Marland Kitchen aspirin 81 MG tablet Take 81 mg by mouth daily.    . ciprofloxacin (CIPRO) 500 MG tablet Take 1 tablet (500 mg total) by mouth 2 (two) times daily. 20 tablet 0  . clindamycin (CLEOCIN) 300 MG capsule Take 1 capsule (300 mg total) by mouth 3 (  three) times daily. 30 capsule 2  . clobetasol (OLUX) 0.05 % topical foam Apply 1 application topically daily as needed (apply to head for dry scalp).     . COLLAGEN-VITAMIN C PO Take by mouth daily.    . cyclobenzaprine (FLEXERIL) 10 MG tablet Take 10 mg by mouth as needed for muscle spasms.    . diphenhydrAMINE (BENADRYL) 50 MG tablet Take as directed 1 tablet 0  . doxycycline (VIBRA-TABS) 100 MG tablet Take 1 tablet (100 mg total) by mouth 2 (two) times daily. 20 tablet 0  . DULoxetine (CYMBALTA) 30 MG capsule Take 90 mg by mouth daily.  5  . fluconazole (DIFLUCAN) 150 MG tablet Take 1 tablet (150 mg total) by mouth daily. (Patient taking differently: Take 150 mg by mouth as needed. ) 1 tablet 1  . JARDIANCE 25 MG TABS tablet Take half a tablet every day. 45 tablet 3  . lisinopril-hydrochlorothiazide (PRINZIDE,ZESTORETIC) 20-25 MG per tablet Take 1 tablet by mouth daily.    Marland Kitchen LORazepam (ATIVAN) 1 MG tablet Take 1 mg by mouth daily as needed.     . meperidine (DEMEROL) 50 MG tablet Take 1 tablet (50 mg total) by mouth every 6 (six) hours as needed. 20 tablet 0  . metFORMIN (GLUCOPHAGE) 1000 MG tablet Take 1 tablet (1,000 mg total) by mouth 2 (two) times daily with a meal. 180 tablet 3  . metoprolol succinate (TOPROL-XL) 25 MG 24 hr tablet     . Multiple Vitamin (MULTI-VITAMIN DAILY PO) Take 1 tablet by mouth daily.     . NON FORMULARY Phazyme 1 daily    . ondansetron (ZOFRAN) 8 MG tablet TAKE 1 TABLET BY MOUTH 3 TIMES A DAY AS NEEDED NAUSEA  (INS PAYS #18/21 DAYS)  0  . ONETOUCH VERIO test strip USE TO TEST BLOOD SUGARS 2 TIMES DAILY (DX: E11.65) 100 each 2  . pantoprazole (PROTONIX) 40 MG tablet Take 40 mg by mouth daily as needed (for reflux). For acid reflux    . Probiotic Product (PROBIOTIC DAILY PO) Take by mouth. Probiotic plus daily    . promethazine (PHENERGAN) 25 MG tablet Take 1 tablet (25 mg total) by mouth every 8 (eight) hours as needed for nausea or vomiting. 20 tablet 0  . ranitidine (ZANTAC) 150 MG tablet Take 1 tablet (150 mg total) by mouth every evening. As needed. 30 tablet 2  . silver sulfADIAZINE (SILVADENE) 1 % cream Apply 1 application topically daily. 50 g 0  . simvastatin (ZOCOR) 40 MG tablet Take 40 mg by mouth at bedtime.     . traMADol (ULTRAM) 50 MG tablet TAKE 1 OR 2 TABLETS BY MOUTH EVERY 6 TO 8 HOURS AS NEEDED FOR PAIN  0  . TRULICITY 1.5 QP/5.9FM SOPN INJECT ONCE A WEEK 12 pen 3   No current facility-administered medications on file prior to visit.    Allergies  Allergen Reactions  . Victoza [Liraglutide]     Severe rash and hives.  . Dilaudid [Hydromorphone]     Rash and swelling  . Ultram [Tramadol] Rash  . Betadine [Povidone Iodine] Other (See Comments)    Skin peels  . Bydureon [Exenatide] Diarrhea and Nausea And Vomiting  . Codeine     Severe headaches  . Contrast Media [Iodinated Diagnostic Agents] Nausea And Vomiting  . Hydrocodone Other (See Comments)    headache  . Iodine Hives    Iodine hives  . Januvia [Sitagliptin] Diarrhea  . Morphine And Related Nausea And Vomiting  .  Percocet [Oxycodone-Acetaminophen] Other (See Comments)    Headache   . Septra [Sulfamethoxazole-Trimethoprim]     rash  . Sulfa Antibiotics Itching  . Ultram [Tramadol Hcl]   . Valium Other (See Comments)    headache  . Vicodin [Hydrocodone-Acetaminophen] Other (See Comments)    headache  . Amoxicillin Hives and Rash  . Darvocet [Propoxyphene N-Acetaminophen] Rash and Other (See Comments)     headache  . Fentanyl And Related Rash  . Wellbutrin [Bupropion Hcl] Rash   Family History  Problem Relation Age of Onset  . Colon cancer Brother 81  . Hypertension Mother        died from infection s/p hip surgery  . Heart attack Father   . Colon cancer Maternal Grandfather   . Stomach cancer Neg Hx   . Esophageal cancer Neg Hx   . Rectal cancer Neg Hx   . Liver cancer Neg Hx     PE: BP 132/72   Pulse 81   Ht 5\' 4"  (1.626 m)   Wt 218 lb 12.8 oz (99.2 kg)   SpO2 97%   BMI 37.56 kg/m  Wt Readings from Last 3 Encounters:  09/24/17 218 lb 12.8 oz (99.2 kg)  05/27/17 213 lb (96.6 kg)  01/24/17 212 lb (96.2 kg)   Constitutional: overweight, in NAD Eyes: PERRLA, EOMI, no exophthalmos ENT: moist mucous membranes, no thyromegaly, no cervical lymphadenopathy Cardiovascular: RRR, No MRG Respiratory: CTA B Gastrointestinal: abdomen soft, NT, ND, BS+ Musculoskeletal: no deformities, strength intact in all 4 Skin: moist, warm, no rashes Neurological: no tremor with outstretched hands, DTR normal in all 4  ASSESSMENT: 1. DM2, non-insulin-dependent, controlled, with complications: - PN - s/p B 2nd toe amputations 2/2 osteomyelitis - 2013 and 2015; 1/2 hallux 04/2017 - PAD - foot ulcer  2. HL  3. Foot ulcer  PLAN:  1. Patient with long-standing, previously uncontrolled diabetes, with better control in the last year, with last HbA1c of 7.0%.  At last visit, sugars were higher after she will had prednisone and antibiotic treatments for asthmatic bronchitis and then developed osteomyelitis and had partial amputation of the left great toe at the end of last year.  At the time of the last visit, sugars were improving so we did not change her regimen.  She continues on oral antidiabetic regimen with metformin and Jardiance and also on GLP-1 receptor agonist (Trulicity).  She had a decreased GFR so we are using half of Jardiance 25 mg tablet.  She is interested in coming off this  medication if possible. - At this visit, sugars are similar to before but with more hyperglycemic spikes.  She did not change her diet since last visit.  She eats an early dinner, around 6 PM.  Her sugars in the morning are the highest.  We discussed about possible reasons for this.  I suggested a lighter dinner, with less fatty foods.  In the meantime, I also suggested to increase Jardiance.  In 2 weeks she had another appointment with PCP and will have a BMP checked then. - I suggested to:   Patient Instructions  Please continue: - Metformin ER 1000 mg 2x a day, with meals - Trulicity 1.5 mg weekly   Please increase: - Jardiance to 25 mg daily before b'fast  Please come back for a follow-up appointment in 4 months.  - today, HbA1c is 7.3% (higher) - continue checking sugars at different times of the day - check 1x a day, rotating checks - advised for  yearly eye exams >> she is UTD - Return to clinic in 4 mo with sugar log    2. HL - Reviewed latest lipid panel from 08/2016 and all cholesterol fractions are at goal - new lipid panel pending in June by PCP - Continues Zocor without side effects.  3. Foot ulcer -Complicated with osteomyelitis -Continues to see podiatry before last visit she had 1/2 of left hallux amputated >> This is now healed  Philemon Kingdom, MD PhD [facility] Endocrinology

## 2017-09-26 ENCOUNTER — Encounter: Payer: Self-pay | Admitting: Internal Medicine

## 2017-09-26 NOTE — Progress Notes (Signed)
Received labs from PCP from 09/13/2017: Glucose 123, BUN 44/creatinine 1.34, GFR 40, previously 44.  Otherwise normal.  09/04/2017: Lipids: 126/269/33/39 CBC with differential normal, except anemia with low hemoglobin/hematocrit 11.1/32.7, and also lower RBCs.  03/06/2017: Urine ACR 222.8, previously 76.7

## 2017-10-14 ENCOUNTER — Ambulatory Visit: Payer: BC Managed Care – PPO | Admitting: Podiatry

## 2017-10-14 ENCOUNTER — Encounter: Payer: Self-pay | Admitting: Podiatry

## 2017-10-14 ENCOUNTER — Ambulatory Visit (INDEPENDENT_AMBULATORY_CARE_PROVIDER_SITE_OTHER): Payer: BC Managed Care – PPO

## 2017-10-14 ENCOUNTER — Telehealth: Payer: Self-pay | Admitting: *Deleted

## 2017-10-14 DIAGNOSIS — L02612 Cutaneous abscess of left foot: Secondary | ICD-10-CM

## 2017-10-14 DIAGNOSIS — L03032 Cellulitis of left toe: Secondary | ICD-10-CM

## 2017-10-14 DIAGNOSIS — M2042 Other hammer toe(s) (acquired), left foot: Secondary | ICD-10-CM | POA: Diagnosis not present

## 2017-10-14 DIAGNOSIS — L97521 Non-pressure chronic ulcer of other part of left foot limited to breakdown of skin: Secondary | ICD-10-CM

## 2017-10-14 MED ORDER — CLINDAMYCIN HCL 300 MG PO CAPS: 300.0000 mg | ORAL_CAPSULE | Freq: Three times a day (TID) | ORAL | 2 refills | Status: DC

## 2017-10-14 NOTE — Telephone Encounter (Signed)
Faxed orders for clindamycin to [facility] [id number].

## 2017-10-15 NOTE — Progress Notes (Signed)
Subjective: 67 year old female presents the office today for concerns of swelling and pain to the left fourth toe.  She states that the area was swollen and more red however she has been soaking her foot and this is been helping.  This is been ongoing the last 2 weeks.  She has not seen any drainage or pus and she denies any red streaks.  No recent injury that she can recall. Denies any systemic complaints such as fevers, chills, nausea, vomiting. No acute changes since last appointment, and no other complaints at this time.   Objective: AAO x3, NAD DP/PT pulses palpable bilaterally, CRT less than 3 seconds Hammertoe contractures present no other digits.  Hyperkeratotic lesion of the distal aspect left third toe and upon debridement there was a superficial wound present however there was purulence identified at the end of the callus but it appeared to be a superficial collection.  Also the nail was loose from the underlying nail bed and this was also debrided to remove the majority of the toenail today.  A superficial abrasion type wound was present to the distal portion of the toe.  There is localized edema and erythema to the distal portion of the toe without any ascending cellulitis.  There is no fluctuation or crepitation identified today. No open lesions or pre-ulcerative lesions.  No pain with calf compression, swelling, warmth, erythema  Assessment: Ulceration, abscess left third toe  Plan: -All treatment options discussed with the patient including all alternatives, risks, complications.  -X-rays were obtained and reviewed.  No evidence of acute osteomyelitis or soft tissue emphysema is present today. -I debrided the hyperkeratotic lesion to reveal underlying ulceration as well as an abscess.  This abscess was cultured today.  Also the majority of the toenail was removed today as it was loose.  Area was cleaned.  Silvadene was applied ointment to continue with the same and she already has  this at home.  Scribe for clindamycin.  I will have her return to the surgical shoe. -Monitor for any clinical signs or symptoms of infection and directed to call the office immediately should any occur or go to the ER. -Patient encouraged to call the office with any questions, concerns, change in symptoms.   Trula Slade DPM

## 2017-10-17 ENCOUNTER — Other Ambulatory Visit: Payer: Self-pay | Admitting: Internal Medicine

## 2017-10-19 LAB — WOUND CULTURE
MICRO NUMBER:: 90699522
SPECIMEN QUALITY:: ADEQUATE

## 2017-10-22 ENCOUNTER — Ambulatory Visit (INDEPENDENT_AMBULATORY_CARE_PROVIDER_SITE_OTHER): Payer: BC Managed Care – PPO | Admitting: Podiatry

## 2017-10-22 ENCOUNTER — Encounter: Payer: Self-pay | Admitting: Podiatry

## 2017-10-22 DIAGNOSIS — L97521 Non-pressure chronic ulcer of other part of left foot limited to breakdown of skin: Secondary | ICD-10-CM

## 2017-10-22 DIAGNOSIS — M2042 Other hammer toe(s) (acquired), left foot: Secondary | ICD-10-CM

## 2017-10-28 NOTE — Progress Notes (Signed)
Subjective: 67 year old female presents the office today for follow-up evaluation of a wound to the left fourth toe.  She said she is doing better.  She is been taking the antibiotics without any side effects.  She is to continue with daily dressing changes with Silvadene.  She is been using the offloading shoe.  She denies any drainage or pus or any increase in swelling or any red streaks.  She feels that the wound is healing.  She has no other concerns. Denies any systemic complaints such as fevers, chills, nausea, vomiting. No acute changes since last appointment, and no other complaints at this time.   Objective: AAO x3, NAD DP/PT pulses palpable bilaterally, CRT less than 3 seconds Sensation decreased with Simms Weinstein monofilament.   Granular ulceration present to the plantar aspect distally of the left third toe.  There is no drainage or pus and there is decrease edema and erythema to the toe.  The wound measures 0.5 x 0.3 cm and superficial.  There is no fluctuation or crepitation there is no malodor.  No ascending cellulitis.  No other open lesions or pre-ulcerative lesions.  No pain with calf compression, swelling, warmth, erythema  Assessment: Ulceration left foot with evidence of healing   Plan: -All treatment options discussed with the patient including all alternatives, risks, complications.  -Today debrided the wound to healthy, granular tissue.  Continue Silvadene dressing changes daily.  Finish course of antibiotics. -Continue offloading at all times. -Monitor for any clinical signs or symptoms of infection and directed to call the office immediately should any occur or go to the ER. -Patient encouraged to call the office with any questions, concerns, change in symptoms.   Trula Slade DPM

## 2017-11-01 ENCOUNTER — Ambulatory Visit (INDEPENDENT_AMBULATORY_CARE_PROVIDER_SITE_OTHER): Payer: BC Managed Care – PPO

## 2017-11-01 ENCOUNTER — Ambulatory Visit: Payer: BC Managed Care – PPO | Admitting: Podiatry

## 2017-11-01 DIAGNOSIS — L97522 Non-pressure chronic ulcer of other part of left foot with fat layer exposed: Secondary | ICD-10-CM

## 2017-11-01 DIAGNOSIS — M2042 Other hammer toe(s) (acquired), left foot: Secondary | ICD-10-CM | POA: Diagnosis not present

## 2017-11-02 NOTE — Progress Notes (Signed)
Subjective: 67 year old female presents the office today for follow-up evaluation of a wound to the left fourth toe.  She states that overall she is doing well she has not had any drainage or pus coming from the area.  She has finished her antibiotics.  She is still wearing a surgical shoe.  Denies any increase in swelling or redness or warmth of her foot.  Denies any red streaks.  She is continue with daily dressing changes.  She has no other concerns today. Denies any systemic complaints such as fevers, chills, nausea, vomiting. No acute changes since last appointment, and no other complaints at this time.   Objective: AAO x3, NAD DP/PT pulses palpable bilaterally, CRT less than 3 seconds Sensation decreased with Simms Weinstein monofilament.   Granular ulceration present to the plantar aspect distally of the left third toe.  There is no drainage or pus and there is only faint edema and erythema to the toe and this is mostly distally.  The wound measures 0.3 x 0.3 cm and superficial.  There is no fluctuation or crepitation there is no malodor.  No ascending cellulitis.  No other open lesions or pre-ulcerative lesions.  No pain with calf compression, swelling, warmth, erythema  Assessment: Ulceration left foot with evidence of healing   Plan: -All treatment options discussed with the patient including all alternatives, risks, complications.  -X-rays were obtained and reviewed.  No definitive evidence of acute osteomyelitis identified at this time there is no soft tissue emphysema.  Given digital contracture is difficult to evaluate. -Today debrided the wound to healthy, granular tissue utilizing #312 blade scalpel to remove all nonviable hyperkeratotic tissue.  Continue Silvadene dressing changes daily.  -Continue offloading at all times.  Remain in surgical shoe and I also dispensed an offloading pad.  This causes any irritation to stop wearing it and let me know. -Monitor for any clinical signs  or symptoms of infection and directed to call the office immediately should any occur or go to the ER. -Patient encouraged to call the office with any questions, concerns, change in symptoms.   Return in about 2 weeks (around 11/15/2017).  Trula Slade DPM

## 2017-11-12 ENCOUNTER — Encounter: Payer: Self-pay | Admitting: Internal Medicine

## 2017-11-12 ENCOUNTER — Other Ambulatory Visit: Payer: Self-pay | Admitting: Internal Medicine

## 2017-11-12 MED ORDER — GLIPIZIDE 5 MG PO TABS: 5.0000 mg | ORAL_TABLET | Freq: Two times a day (BID) | ORAL | 3 refills | Status: DC

## 2017-11-18 ENCOUNTER — Ambulatory Visit: Payer: BC Managed Care – PPO | Admitting: Podiatry

## 2017-11-18 DIAGNOSIS — L97522 Non-pressure chronic ulcer of other part of left foot with fat layer exposed: Secondary | ICD-10-CM

## 2017-11-19 NOTE — Progress Notes (Signed)
Subjective: 67 year old female presents the office today for follow-up evaluation of a wound to the left fourth toe.  She states the toe is getting better but still uncomfortable at times.  She is wearing the surgical shoe.  She has been continuing daily dressing changes.  Denies any drainage or pus.  Still has noticed some swelling to the toe but no increase in redness or red streaks.  She has no other concerns and she denies any systemic complaints including fevers, chills, nausea, vomiting. No acute changes since last appointment, and no other complaints at this time.   Objective: AAO x3, NAD DP/PT pulses palpable bilaterally, CRT less than 3 seconds Sensation decreased with Simms Weinstein monofilament.   Granular ulceration present to the plantar aspect distally of the left third toe.  There is no drainage or pus and there is only faint edema and erythema to the toe and this is mostly distally.  The wound measures 0.2 x 0.2 cm and superficial and almost an abrasion type lesion.  There is no fluctuation or crepitation there is no malodor.  No ascending cellulitis.  No other open lesions or pre-ulcerative lesions.  No pain with calf compression, swelling, warmth, erythema  Assessment: Ulceration left foot with evidence of healing   Plan: -All treatment options discussed with the patient including all alternatives, risks, complications.  -Today debrided the wound to healthy, granular tissue utilizing #312 blade scalpel to remove all nonviable as well as hyperkeratotic tissue.  Continue daily dressing changes. -Continue offloading at all times.  Remain in surgical shoe and continue offloading at all times. -Monitor for any clinical signs or symptoms of infection and directed to call the office immediately should any occur or go to the ER. -Patient encouraged to call the office with any questions, concerns, change in symptoms.   Return in about 2 weeks (around 11/15/2017).  Trula Slade  DPM

## 2017-11-25 ENCOUNTER — Other Ambulatory Visit (HOSPITAL_COMMUNITY): Payer: Self-pay | Admitting: Family Medicine

## 2017-11-25 ENCOUNTER — Ambulatory Visit (HOSPITAL_COMMUNITY)
Admission: RE | Admit: 2017-11-25 | Discharge: 2017-11-25 | Disposition: A | Discharge status: Home / Self Care | Payer: BC Managed Care – PPO | Source: Ambulatory Visit | Attending: Surgery | Admitting: Surgery

## 2017-11-25 DIAGNOSIS — R600 Localized edema: Secondary | ICD-10-CM

## 2017-12-02 ENCOUNTER — Ambulatory Visit: Payer: BC Managed Care – PPO | Admitting: Podiatry

## 2017-12-02 DIAGNOSIS — L97522 Non-pressure chronic ulcer of other part of left foot with fat layer exposed: Secondary | ICD-10-CM

## 2017-12-02 DIAGNOSIS — M2042 Other hammer toe(s) (acquired), left foot: Secondary | ICD-10-CM

## 2017-12-04 NOTE — Progress Notes (Signed)
Subjective: 67 year old female presents the office today for follow-up evaluation of a wound to the left fourth toe.  She states this is been continue with daily dressing changes.  She states that she denies any drainage or pus coming from the area "I am all healed and you are going to release me".  No increase in swelling or redness that she has noticed.  She is wearing a surgical shoe.  She has no other concerns today.  She has no other concerns and she denies any systemic complaints including fevers, chills, nausea, vomiting. No acute changes since last appointment, and no other complaints at this time.   Objective: AAO x3, NAD DP/PT pulses palpable bilaterally, CRT less than 3 seconds Sensation decreased with Simms Weinstein monofilament.   Granular ulceration present to the plantar aspect distally of the left third toe.  There is no drainage or pus and there is only faint edema and erythema to the toe and this is mostly distally.  The wound measures 0.5 x 0.2 cm and appears to be slightly larger.  It does appear to be dry.  There is no probing to bone, undermining or tunneling.  There is no fluctuation or crepitation there is no malodor.  No ascending cellulitis.  No other open lesions or pre-ulcerative lesions.  No pain with calf compression, swelling, warmth, erythema  Assessment: Ulceration left foot   Plan: -All treatment options discussed with the patient including all alternatives, risks, complications.  -Today debrided the wound to healthy, granular tissue utilizing #312 blade scalpel to remove all nonviable as well as hyperkeratotic tissue.  Continue daily dressing changes. -Discussed doing a flexor tenotomy to help offload the toe.  Is not getting any better next appointment opted there is any worsening I would consider doing this. -Continue offloading at all times.  Remain in surgical shoe and continue offloading at all times. -Monitor for any clinical signs or symptoms of infection  and directed to call the office immediately should any occur or go to the ER. -Patient encouraged to call the office with any questions, concerns, change in symptoms.   Return in about 1 week (around 12/09/2017).  Trula Slade DPM

## 2017-12-12 ENCOUNTER — Ambulatory Visit: Payer: BC Managed Care – PPO | Admitting: Podiatry

## 2017-12-12 DIAGNOSIS — M2042 Other hammer toe(s) (acquired), left foot: Secondary | ICD-10-CM

## 2017-12-12 DIAGNOSIS — L97522 Non-pressure chronic ulcer of other part of left foot with fat layer exposed: Secondary | ICD-10-CM | POA: Diagnosis not present

## 2017-12-16 NOTE — Progress Notes (Signed)
Subjective: 67 year old female presents the office today for follow-up evaluation of a wound to the left fourth toe.  She states overall she is doing well.  She is been continue with daily dressing changes.  She states that she has been wearing surgical shoe the offloading pad.  She denies any increase in swelling or redness or drainage or pus.  She has no other concerns.  Objective: AAO x3, NAD DP/PT pulses palpable bilaterally, CRT less than 3 seconds Sensation decreased with Simms Weinstein monofilament.   Granular ulceration present to the plantar aspect distally of the left third toe.  Overall the toe wound appears to be healing is starting to scab over.  It measures approximate 0.5 x 0.1 cm.  There is no drainage or pus there is no increase in warmth.  There is faint edema and erythema to the distal portion of the toe.  Hammertoe contractures present. No pain with calf compression, swelling, warmth, erythema  Assessment: Ulceration left foot   Plan: -All treatment options discussed with the patient including all alternatives, risks, complications.  -Today debrided the wound to healthy, granular tissue utilizing #312 blade scalpel to remove all nonviable as well as hyperkeratotic tissue.  Continue daily dressing changes. -We can discuss a flexor tenotomy today.  She is hesitant to do this.  The wound does look somewhat better today.  We will keep a close eye on this.  If there is no improvement next appointment we discussed the flexor tenotomy.  She is in agreement with this. -Continue offloading at all times.  Remain in surgical shoe and continue offloading at all times. -Monitor for any clinical signs or symptoms of infection and directed to call the office immediately should any occur or go to the ER. -Patient encouraged to call the office with any questions, concerns, change in symptoms.   Return in about 1 week (around 12/19/2017).  Trula Slade DPM

## 2017-12-19 ENCOUNTER — Ambulatory Visit (INDEPENDENT_AMBULATORY_CARE_PROVIDER_SITE_OTHER): Payer: BC Managed Care – PPO | Admitting: Podiatry

## 2017-12-19 ENCOUNTER — Encounter: Payer: Self-pay | Admitting: Podiatry

## 2017-12-19 DIAGNOSIS — L97522 Non-pressure chronic ulcer of other part of left foot with fat layer exposed: Secondary | ICD-10-CM | POA: Diagnosis not present

## 2017-12-19 DIAGNOSIS — M2042 Other hammer toe(s) (acquired), left foot: Secondary | ICD-10-CM

## 2017-12-23 NOTE — Progress Notes (Signed)
Subjective: 67 year old female presents the office today for follow-up evaluation of a wound to the left fourth toe.  She states that the wound is doing about the same.  She can change the bandage daily as well as using offloading shoe without any significant improvement.  She denies any drainage or pus coming from the wound denies any swelling or redness.  She has no other concerns today.  Objective: AAO x3, NAD DP/PT pulses palpable bilaterally, CRT less than 3 seconds Sensation decreased with Simms Weinstein monofilament.   Granular ulceration present to the plantar aspect distally of the left third toe.  Overall the wound appears to be about the same size.  The wound today measures 0.5 x 0.2 cm with a granular wound base.  It still superficial with no probing to bone, undermining or tunneling.  There is minimal localized edema to the distal portion of toe but there is no increase in warmth or ascending cellulitis.  There is no fluctuation or crepitation or any malodor.  Hammertoe contractures are present.  No other open lesions are identified. No pain with calf compression, swelling, warmth, erythema  Assessment: Chronic ulceration left foot with hammertoe deformity  Plan: -All treatment options discussed with the patient including all alternatives, risks, complications.  -Today the wound is about the same.  We discussed options for that this point.  This time I do think a flexor tenotomy would be beneficial for her.  We discussed the procedure as well as risks, complications as well as alternatives.  After discussion she wished to proceed with this.  The toe was anesthetized utilizing 3 cc of a mixture of lidocaine and Marcaine plain.  Once anesthetized the skin was prepped with Betadine.  Utilizing 18-gauge needle from a plantar approach along the IPJ and utilize this to transect the tendon.  At this time the toe was sitting in a more rectus position.  The area was flushed with alcohol and a  bandage was applied.  Continue with surgical shoe and offloading all times.  She can change the bandage tomorrow and continue with similar dressing that we have been on the wound and she keep antibiotic ointment and a Band-Aid on the puncture site. -Monitor for any clinical signs or symptoms of infection and directed to call the office immediately should any occur or go to the ER.  Trula Slade DPM

## 2017-12-27 ENCOUNTER — Ambulatory Visit: Payer: BC Managed Care – PPO | Admitting: Podiatry

## 2017-12-30 ENCOUNTER — Ambulatory Visit (INDEPENDENT_AMBULATORY_CARE_PROVIDER_SITE_OTHER): Payer: BC Managed Care – PPO

## 2017-12-30 ENCOUNTER — Ambulatory Visit (INDEPENDENT_AMBULATORY_CARE_PROVIDER_SITE_OTHER): Payer: BC Managed Care – PPO | Admitting: Podiatry

## 2017-12-30 DIAGNOSIS — M2042 Other hammer toe(s) (acquired), left foot: Secondary | ICD-10-CM

## 2017-12-30 DIAGNOSIS — L97521 Non-pressure chronic ulcer of other part of left foot limited to breakdown of skin: Secondary | ICD-10-CM | POA: Diagnosis not present

## 2017-12-31 ENCOUNTER — Encounter: Payer: Self-pay | Admitting: Internal Medicine

## 2017-12-31 NOTE — Progress Notes (Signed)
Subjective: 67 year old female presents the office today for follow-up evaluation of a wound on the left second toe status post flexor tenotomy.  She said that she is doing well and she thinks that the wound is healed.  Denies any drainage or pus come from the area and no significant increase in swelling or redness.  She is remained in the [place] shoe the offloading Peg[employer]ist.  Denies any systemic complaints such as fevers, chills, nausea, vomiting. No acute changes since last appointment, and no other complaints at this time.   Objective: AAO x3, NAD DP/PT pulses palpable bilaterally, CRT less than 3 seconds Puncture site, flexor tenotomy appears to be healed.,  Hyperkeratotic tissue of the distal portion of the toe.  Upon debridement the wound appears to be almost completely healed at this time.  There is no drainage or pus there is no significant edema, erythema, increase in warmth.  There is no ascending cellulitis.  No fluctuation or crepitation or any signs of infection. No open lesions or pre-ulcerative lesions.  No pain with calf compression, swelling, warmth, erythema  Assessment: Status post flexor tenotomy with healing wound  Plan: -All treatment options discussed with the patient including all alternatives, risks, complications.  -From the procedure standpoint she is doing well.  The area is healed.  I sharply debrided the wound to remove all nonviable tissue appears that the wound is almost completely healed.  There is no signs of infection.  I want her to continue with dressing changes daily as well as wearing the offloading shoe for the next week.  If the area continues to do well she can start to transition back into her diabetic shoe.  Discussed the importance of making sure she is not getting callus and wound to the fourth and fifth toes.  Daily foot inspection encouraged -Monitor for any clinical signs or symptoms of infection and directed to call the office immediately should any  occur or go to the ER. -Patient encouraged to call the office with any questions, concerns, change in symptoms.   Return in about 2 weeks (around 01/13/2018).  Trula Slade DPM

## 2018-01-13 ENCOUNTER — Ambulatory Visit: Payer: BC Managed Care – PPO | Admitting: Podiatry

## 2018-01-13 DIAGNOSIS — M2042 Other hammer toe(s) (acquired), left foot: Secondary | ICD-10-CM

## 2018-01-13 DIAGNOSIS — L97521 Non-pressure chronic ulcer of other part of left foot limited to breakdown of skin: Secondary | ICD-10-CM

## 2018-01-14 ENCOUNTER — Other Ambulatory Visit: Payer: Self-pay | Admitting: Internal Medicine

## 2018-01-15 NOTE — Progress Notes (Signed)
Subjective: 67 year old female presents the office today for evaluation of a wound to the left third toe.  She states that overall she is doing well she feels that the wound is healed where she is to be discharged today.  She denies any drainage or pus coming from the area.  She is been wearing a regular shoe last couple days where she is.  She has no pain.  No other concerns.Denies any systemic complaints such as fevers, chills, nausea, vomiting. No acute changes since last appointment, and no other complaints at this time.   Objective: AAO x3, NAD DP/PT pulses palpable bilaterally, CRT less than 3 seconds Hammertoe contractures are present to the left fourth and fifth toes.  The distal also the left third toe is a hyperkeratotic lesion.  Upon debridement appears that the ulceration has healed completely and there is no underlying ulceration drainage or any signs of infection.  No significant edema, erythema.  No open lesions or pre-ulcerative lesions.  No pain with calf compression, swelling, warmth, erythema  Assessment: Healed wound left third toe  Plan: -All treatment options discussed with the patient including all alternatives, risks, complications.  -She will be debrided the hyperkeratotic lesion to the any complications of bleeding.  On her continue with offloading pads daily as well as wearing supportive shoes to help offload the areas.  Monitoring skin breakdown or any other signs or symptoms of recurrence of infection. -Patient encouraged to call the office with any questions, concerns, change in symptoms.   Trula Slade DPM

## 2018-01-27 ENCOUNTER — Encounter: Payer: Self-pay | Admitting: Internal Medicine

## 2018-01-27 ENCOUNTER — Ambulatory Visit: Payer: BC Managed Care – PPO | Admitting: Internal Medicine

## 2018-01-27 VITALS — BP 142/70 | HR 69 | Ht 64.0 in | Wt 216.0 lb

## 2018-01-27 DIAGNOSIS — B373 Candidiasis of vulva and vagina: Secondary | ICD-10-CM | POA: Diagnosis not present

## 2018-01-27 DIAGNOSIS — E1142 Type 2 diabetes mellitus with diabetic polyneuropathy: Secondary | ICD-10-CM | POA: Diagnosis not present

## 2018-01-27 DIAGNOSIS — IMO0002 Reserved for concepts with insufficient information to code with codable children: Secondary | ICD-10-CM

## 2018-01-27 DIAGNOSIS — E785 Hyperlipidemia, unspecified: Secondary | ICD-10-CM

## 2018-01-27 DIAGNOSIS — E1165 Type 2 diabetes mellitus with hyperglycemia: Secondary | ICD-10-CM | POA: Diagnosis not present

## 2018-01-27 DIAGNOSIS — B3731 Acute candidiasis of vulva and vagina: Secondary | ICD-10-CM

## 2018-01-27 LAB — POCT GLYCOSYLATED HEMOGLOBIN (HGB A1C): HEMOGLOBIN A1C: 6.7 % — AB (ref 4.0–5.6)

## 2018-01-27 MED ORDER — JARDIANCE 25 MG PO TABS: ORAL_TABLET | ORAL | 3 refills | Status: DC

## 2018-01-27 NOTE — Patient Instructions (Addendum)
Please continue: - Metformin ER 1000 mg 2x a day, with meals - Trulicity 1.5 mg weekly  - Jardiance 25 mg daily before b'fast  You can stay off Glipizide.  Please come back for a follow-up appointment in 4 months.

## 2018-01-27 NOTE — Progress Notes (Signed)
Patient ID: [name], female   DOB: 1950/11/29, 67 y.o.   MRN: [id number]   HPI: [name] is a 67 y.o.-year-old female, initially referred by her PCP, Dr. Harley Alto for f/u for DM2, dx in 1980s, non-insulin-dependent, controlled, with complications (PN - s/p B digit amputations 2/2 osteomyelitis, PAD, foot ulcer). She saw Dr. Elyse Hsu previously. Last visit with me 4 months ago.  She had toe surgery (tendon resection) 6 weeks ago after she developed an infection in her toe.  Her sugars were higher around the time of her surgery, in the 200s.  At that time, she called [place] and we discussed about restarting glipizide.  She did so, but her sugars improved and she even developed a low blood sugar while standing in line at the grocery store 2 weeks ago so she stopped the glipizide since then.  She also had an episode of gastritis since last visit.  Last hemoglobin A1c was: Lab Results  Component Value Date   HGBA1C 7.3 (A) 09/24/2017   HGBA1C 7.0 05/27/2017   HGBA1C 6.8 01/24/2017  02/16/2016: 7.1% 06/2015: 6.6%  Pt is on a regimen of: - Metformin ER 1000 mg 2x a day  - Jardiance 25 mg in am >> 12.5 mg daily due to CKD (decreased 05/2016) >> increased back to 25 mg daily 85/4627 - Trulicity 1.5 mg weekly  We started Glipizide 5 mg 2x a day - restarted 12/2017 for high sugars - surgery on toe >> stopped 2 weeks ago 2/2 perceived hypoglycemia She was on Victoza >> rash. In 09/2016, she had a steroid inj in back >> sugars 200s >> used Novolog for 3 days.   Pt checks her sugars 1x a day: - am: 113, 125-157 >> 120-144, 170, 244 (infection) - 2h after b'fast: 125-163, 214, 239 >> 137 >> 125-137, 171 - before lunch:135-140, 165 >> 115-140 >> 111-140, 180 (infection) - 2h after lunch: 130-145 >> 127-140 >> 130-152 - before dinner: 130-150, 180 >> 127-140 >> 127-135, 200 (infection) - 2h after dinner: 130-145 >> 100-147, 179 >> 117, 145 - bedtime:  135, 140 >> 125-145 >> 150, 165  >> 140-160 - nighttime: n/c Lowest sugar was 120 >> 88 >> 100 >> 111; she has hypoglycemia awareness in the 90s. Highest sugar was 280s >> 239 >> 165 >> 244.  Glucometer: One Touch Verio  Pt's meals are: - Breakfast: [place] bacon, 2 eggs, toast; oatmeal; or brown rice - Lunch: Tuna, crackers or sandwich, chicken strips, chips, diet Pepsi - Dinner: Meat, veggies, starch  - Snacks: 2-3: 15g carbs (pretzels, chips)  For exercise, she walks or rides her stationary bike 4 times a week.  -+ CKD, last BUN/creatinine:  09/13/2017: Glucose 123, BUN 44/creatinine 1.34, GFR 40, previously 44 Lab Results  Component Value Date   BUN 31 (H) 04/23/2017   BUN 32 (H) 05/10/2016   CREATININE 1.25 (H) 04/23/2017   CREATININE 1.32 (H) 05/10/2016  03/06/2017: ACR 222.8 08/29/2016: ACR 76.7 02/16/2016: 42/1.34 (GFR 40), Glu 213  12/26/2015: 25/1.23 (GFR 44), Glu 153 On lisinopril.  -+ HL; last set of lipids: 09/04/2017: 126/269/33/39 08/29/2016: 87/132/32/30 02/16/2016: 128/278/33/39  03/02/2014: 114/175/32/48 No results found for: CHOL, HDL, LDLCALC, LDLDIRECT, TRIG, CHOLHDL On Zocor. - last eye exam was in 2019: No DR (Dr. Gershon Crane). + Cataracts >> will have sx. -+ Numbness and tingling in her feet.  Sees podiatry (Dr. Jacqualyn Posey).  She also has a history of HTN, GERD, anemia.  She had amputation of 1/2 of the  left hallux 05/06/2017.  This is now healed. She started Humira (for psoriasis) but did not tolerate this >> stopped. She started PT for her back pain. Also dry needling.   ROS: Constitutional: + Weight gain/no weight loss, + fatigue, no subjective hyperthermia, no subjective hypothermia Eyes: no blurry vision, no xerophthalmia ENT: no sore throat, no nodules palpated in throat, no dysphagia, no odynophagia, no hoarseness Cardiovascular: no CP/no SOB/no palpitations/no leg swelling Respiratory: + Cough/no SOB/no wheezing Gastrointestinal: no N/no V/+ D/no C/+ acid reflux Musculoskeletal:  + Muscle aches/+ joint aches Skin: + Rash, + hair loss Neurological: no tremors/no numbness/no tingling/no dizziness  I reviewed pt's medications, allergies, PMH, social hx, family hx, and changes were documented in the history of present illness. Otherwise, unchanged from my initial visit note.  Past Medical History:  Diagnosis Date  . Anemia in chronic renal disease 06/05/2011  . Anxiety    takes Ativan tid  . Asthma    has an inhaler prn  . Cough   . DDD (degenerative disc disease), cervical 06/05/2011  . DDD (degenerative disc disease), lumbosacral 06/05/2011  . Depression    takes Cymbalta daily  . Diabetes mellitus    takes Amaryl and Metformin daily  . Diabetic neuropathy ([place])   . Diverticulosis   . Dizziness   . GERD (gastroesophageal reflux disease)    takes Protonix prn  . Headache(784.0)    occasionally  . History of bronchitis    last time 71yrs ago  . History of colon polyps   . History of MRSA infection   . HTN (hypertension) 06/05/2011   takes Prinizide daily  . Hyperlipidemia    takes Zocor daily  . Insomnia    not on any meds at present time  . Osteoarthritis   . PONV (postoperative nausea and vomiting)    hard to wake up  . Psoriasis 06/05/2011   on legs and uses cream prn  . Seasonal allergies    but doesn't take any meds  . Sleep apnea    uses CPAP  . Tubular adenoma of colon    Past Surgical History:  Procedure Laterality Date  . ABDOMINAL HYSTERECTOMY  1982 and 1984  . AMPUTATION  02/12/2012   Procedure: AMPUTATION RAY;  Surgeon: Wylene Simmer, MD;  Location: [facility];  Service: Orthopedics;  Laterality: Right;  RIGHT 2ND TOE AMPUTATION   . BACK SURGERY     x 3  . CARDIAC CATHETERIZATION     08/09/04: 50% mid AV groove CX lesion, NL LM, LAD, Ramus, EF > 60% (Dr. Gwenlyn Found)  . CARPAL TUNNEL RELEASE Bilateral 2010  . CERVICAL SPINE SURGERY    . CHOLECYSTECTOMY  1977  . COLONOSCOPY    . DILATION AND CURETTAGE OF UTERUS    . ELBOW SURGERY     left d/t  tendonitis  . ESOPHAGOGASTRODUODENOSCOPY    . LUMBAR WOUND DEBRIDEMENT N/A 07/15/2012   Procedure: I & D of Lumbar Wound: Possible Repair of CSF Leak;  Surgeon: Elaina Hoops, MD;  Location: [facility];  Service: Neurosurgery;  Laterality: N/A;  I & D of Lumbar Wound: Possible Repair of CSF Leak placement lumbar drain  . right knee arthroscopy     x 2  . SHOULDER ARTHROSCOPY Left   . TOE AMPUTATION Left    Social History   Social History  . Marital status: Married    Spouse name: N/A  . Number of children: 2   Occupational History  . Disabled  Social History Main Topics  . Smoking status: Never Smoker  . Smokeless tobacco: Never Used     Comment: never used tobacco  . Alcohol use No  . Drug use: No   Current Outpatient Medications on File Prior to Visit  Medication Sig Dispense Refill  . albuterol (PROVENTIL HFA;VENTOLIN HFA) 108 (90 BASE) MCG/ACT inhaler Inhale 1 puff into the lungs as needed for shortness of breath.     Marland Kitchen aspirin 81 MG tablet Take 81 mg by mouth daily.    . clindamycin (CLEOCIN) 300 MG capsule Take 1 capsule (300 mg total) by mouth 3 (three) times daily. 30 capsule 2  . clobetasol (OLUX) 0.05 % topical foam Apply 1 application topically daily as needed (apply to head for dry scalp).     . COLLAGEN-VITAMIN C PO Take by mouth daily.    . DULoxetine (CYMBALTA) 30 MG capsule Take 90 mg by mouth daily.  5  . glipiZIDE (GLUCOTROL) 5 MG tablet Take 1 tablet (5 mg total) by mouth 2 (two) times daily before a meal. 60 tablet 3  . JARDIANCE 25 MG TABS tablet Take a tablet every day. 90 tablet 3  . lisinopril-hydrochlorothiazide (PRINZIDE,ZESTORETIC) 20-25 MG per tablet Take 1 tablet by mouth daily.    Marland Kitchen LORazepam (ATIVAN) 1 MG tablet Take 1 mg by mouth daily as needed.     . meperidine (DEMEROL) 50 MG tablet Take 1 tablet (50 mg total) by mouth every 6 (six) hours as needed. 20 tablet 0  . metFORMIN (GLUCOPHAGE) 1000 MG tablet TAKE 1 TABLET BY MOUTH TWICE A DAY WITH A  MEAL 180 tablet 0  . metoprolol succinate (TOPROL-XL) 25 MG 24 hr tablet     . Multiple Vitamin (MULTI-VITAMIN DAILY PO) Take 1 tablet by mouth daily.     Glory Rosebush VERIO test strip USE TO TEST BLOOD SUGARS 2 TIMES DAILY (DX: E11.65) 100 each 2  . pantoprazole (PROTONIX) 40 MG tablet Take 40 mg by mouth daily as needed (for reflux). For acid reflux    . Probiotic Product (PROBIOTIC DAILY PO) Take by mouth. Probiotic plus daily    . ranitidine (ZANTAC) 150 MG tablet Take 1 tablet (150 mg total) by mouth every evening. As needed. 30 tablet 2  . silver sulfADIAZINE (SILVADENE) 1 % cream Apply 1 application topically daily. 50 g 0  . simvastatin (ZOCOR) 40 MG tablet Take 40 mg by mouth at bedtime.     . TRULICITY 1.5 ZO/1.0RU SOPN INJECT ONCE A WEEK 12 pen 3   No current facility-administered medications on file prior to visit.    Allergies  Allergen Reactions  . Victoza [Liraglutide]     Severe rash and hives.  . Dilaudid [Hydromorphone]     Rash and swelling  . Ultram [Tramadol] Rash  . Betadine [Povidone Iodine] Other (See Comments)    Skin peels  . Bydureon [Exenatide] Diarrhea and Nausea And Vomiting  . Codeine     Severe headaches  . Contrast Media [Iodinated Diagnostic Agents] Nausea And Vomiting  . Humira [Adalimumab] Hives  . Hydrocodone Other (See Comments)    headache  . Iodine Hives    Iodine hives  . Januvia [Sitagliptin] Diarrhea  . Morphine And Related Nausea And Vomiting  . Percocet [Oxycodone-Acetaminophen] Other (See Comments)    Headache   . Septra [Sulfamethoxazole-Trimethoprim]     rash  . Sulfa Antibiotics Itching  . Ultram [Tramadol Hcl]   . Valium Other (See Comments)    headache  .  Vicodin [Hydrocodone-Acetaminophen] Other (See Comments)    headache  . Amoxicillin Hives and Rash  . Darvocet [Propoxyphene N-Acetaminophen] Rash and Other (See Comments)    headache  . Fentanyl And Related Rash  . Wellbutrin [Bupropion Hcl] Rash   Family History   Problem Relation Age of Onset  . Colon cancer Brother 42  . Hypertension Mother        died from infection s/p hip surgery  . Heart attack Father   . Colon cancer Maternal Grandfather   . Stomach cancer Neg Hx   . Esophageal cancer Neg Hx   . Rectal cancer Neg Hx   . Liver cancer Neg Hx     PE: There were no vitals taken for this visit. Wt Readings from Last 3 Encounters:  09/24/17 218 lb 12.8 oz (99.2 kg)  05/27/17 213 lb (96.6 kg)  01/24/17 212 lb (96.2 kg)   Constitutional: overweight, in NAD Eyes: PERRLA, EOMI, no exophthalmos ENT: moist mucous membranes, no thyromegaly, no cervical lymphadenopathy Cardiovascular: RRR, No MRG Respiratory: CTA B Gastrointestinal: abdomen soft, NT, ND, BS+ Musculoskeletal: no deformities, strength intact in all 4 Skin: moist, warm, no rashes Neurological: no tremor with outstretched hands, DTR normal in all 4  ASSESSMENT: 1. DM2, non-insulin-dependent, controlled, with complications: - PN - s/p B 2nd toe amputations 2/2 osteomyelitis - 2013 and 2015; 1/2 hallux 04/2017 - PAD - foot ulcer  2. HL  3. Foot ulcer  PLAN:  1. Patient with long-standing, previously uncontrolled diabetes, with better control in the last year, with last HbA1c 7.3% at last visit.  She continues on an oral medication regimen + GLP-1 receptor agonist.  At last visit, as she had more hyperglycemic spikes, we increased her Jardiance.  We also discussed about improving her diet by reducing fatty foods.  Since last visit, she contacted me that her sugars were worse after she started olanzapine and also she had a toe infection.  We restarted glipizide a month ago because of the high CBGs.  After starting glipizide, sugars improved and she even developed a hypoglycemic episode (she felt it but did not have her meter with her to check sugars) so she stopped glipizide 2 weeks ago.  Since then, sugars continue to remain controlled. -At this visit, we discussed that she can  now stay off glipizide and we will continue the current regimen - I suggested to:  Patient Instructions  Please continue: - Metformin ER 1000 mg 2x a day, with meals - Trulicity 1.5 mg weekly  - Jardiance 25 mg daily before b'fast  You can stay off Glipizide.  Please come back for a follow-up appointment in 4 months.  - today, HbA1c is 6.7% (better) - continue checking sugars at different times of the day - check 1x a day, rotating checks - advised for yearly eye exams >> she is UTD - We will check a BMP today since we increase Jardiance at last visit - Return to clinic in 4 mo with sugar log    2. HL - Reviewed latest lipid panel from 09/04/2017: LDL, total cholesterol at goal.  HDL low, triglycerides high. - Continues the statin without side effects  3.  Yeast vaginitis - Most likely related to Jardiance.  - Resolved despite increasing Jardiance dose at last visit  Component     Latest Ref Rng & Units 01/27/2018  Glucose     65 - 99 mg/dL 117 (H)  BUN     7 - 25 mg/dL 33 (  H)  Creatinine     0.50 - 0.99 mg/dL 1.42 (H)  GFR, Est Non African American     > OR = 60 mL/min/1.52m2 39 (L)  GFR, Est African American     > OR = 60 mL/min/1.49m2 45 (L)  BUN/Creatinine Ratio     6 - 22 (calc) 23 (H)  Sodium     135 - 146 mmol/L 138  Potassium     3.5 - 5.3 mmol/L 5.3  Chloride     98 - 110 mmol/L 102  CO2     20 - 32 mmol/L 26  Calcium     8.6 - 10.4 mg/dL 9.9   Kidney function is a little worse.  Potassium at the upper limit of normal.  For now, I would suggest to decrease the Jardiance to 12.5 mg daily.  Philemon Kingdom, MD PhD [facility]

## 2018-01-28 LAB — BASIC METABOLIC PANEL WITH GFR
BUN/Creatinine Ratio: 23 (calc) — ABNORMAL HIGH (ref 6–22)
BUN: 33 mg/dL — AB (ref 7–25)
CALCIUM: 9.9 mg/dL (ref 8.6–10.4)
CO2: 26 mmol/L (ref 20–32)
CREATININE: 1.42 mg/dL — AB (ref 0.50–0.99)
Chloride: 102 mmol/L (ref 98–110)
GFR, EST AFRICAN AMERICAN: 45 mL/min/{1.73_m2} — AB (ref >=60)
GFR, EST NON AFRICAN AMERICAN: 39 mL/min/{1.73_m2} — AB (ref >=60)
Glucose, Bld: 117 mg/dL — ABNORMAL HIGH (ref 65–99)
Potassium: 5.3 mmol/L (ref 3.5–5.3)
Sodium: 138 mmol/L (ref 135–146)

## 2018-01-28 MED ORDER — JARDIANCE 25 MG PO TABS: ORAL_TABLET | ORAL | 3 refills | Status: DC

## 2018-02-21 ENCOUNTER — Ambulatory Visit: Payer: BC Managed Care – PPO | Admitting: Podiatry

## 2018-03-14 ENCOUNTER — Encounter: Payer: Self-pay | Admitting: Internal Medicine

## 2018-03-17 DIAGNOSIS — J069 Acute upper respiratory infection, unspecified: Secondary | ICD-10-CM | POA: Diagnosis not present

## 2018-03-17 DIAGNOSIS — E1129 Type 2 diabetes mellitus with other diabetic kidney complication: Secondary | ICD-10-CM | POA: Diagnosis not present

## 2018-03-21 DIAGNOSIS — G2581 Restless legs syndrome: Secondary | ICD-10-CM | POA: Diagnosis not present

## 2018-03-21 DIAGNOSIS — L409 Psoriasis, unspecified: Secondary | ICD-10-CM | POA: Diagnosis not present

## 2018-03-21 DIAGNOSIS — N183 Chronic kidney disease, stage 3 (moderate): Secondary | ICD-10-CM | POA: Diagnosis not present

## 2018-03-21 DIAGNOSIS — F322 Major depressive disorder, single episode, severe without psychotic features: Secondary | ICD-10-CM | POA: Diagnosis not present

## 2018-03-21 DIAGNOSIS — I129 Hypertensive chronic kidney disease with stage 1 through stage 4 chronic kidney disease, or unspecified chronic kidney disease: Secondary | ICD-10-CM | POA: Diagnosis not present

## 2018-03-21 DIAGNOSIS — Z0000 Encounter for general adult medical examination without abnormal findings: Secondary | ICD-10-CM | POA: Diagnosis not present

## 2018-03-21 DIAGNOSIS — Z6837 Body mass index (BMI) 37.0-37.9, adult: Secondary | ICD-10-CM | POA: Diagnosis not present

## 2018-03-21 DIAGNOSIS — E1129 Type 2 diabetes mellitus with other diabetic kidney complication: Secondary | ICD-10-CM | POA: Diagnosis not present

## 2018-03-21 DIAGNOSIS — E782 Mixed hyperlipidemia: Secondary | ICD-10-CM | POA: Diagnosis not present

## 2018-03-21 DIAGNOSIS — Z89421 Acquired absence of other right toe(s): Secondary | ICD-10-CM | POA: Diagnosis not present

## 2018-03-21 DIAGNOSIS — G4733 Obstructive sleep apnea (adult) (pediatric): Secondary | ICD-10-CM | POA: Diagnosis not present

## 2018-03-24 ENCOUNTER — Encounter: Payer: Self-pay | Admitting: Internal Medicine

## 2018-03-24 NOTE — Progress Notes (Signed)
Received labs from PCP, drawn on 03/21/2018: CMP normal with the exception of high glucose at 143, BUN/creatinine 37/1.26, GFR 43 (kidney function improved from 09/2017) Lipids: 92/202/31/21 Urinalysis with 1+ protein

## 2018-04-14 ENCOUNTER — Ambulatory Visit: Payer: PPO | Admitting: Podiatry

## 2018-04-14 DIAGNOSIS — E1142 Type 2 diabetes mellitus with diabetic polyneuropathy: Secondary | ICD-10-CM | POA: Insufficient documentation

## 2018-04-14 DIAGNOSIS — Z89422 Acquired absence of other left toe(s): Secondary | ICD-10-CM | POA: Insufficient documentation

## 2018-04-14 DIAGNOSIS — K5790 Diverticulosis of intestine, part unspecified, without perforation or abscess without bleeding: Secondary | ICD-10-CM | POA: Insufficient documentation

## 2018-04-14 DIAGNOSIS — E1149 Type 2 diabetes mellitus with other diabetic neurological complication: Secondary | ICD-10-CM | POA: Diagnosis not present

## 2018-04-14 DIAGNOSIS — B351 Tinea unguium: Secondary | ICD-10-CM

## 2018-04-14 DIAGNOSIS — R809 Proteinuria, unspecified: Secondary | ICD-10-CM | POA: Insufficient documentation

## 2018-04-14 DIAGNOSIS — M79674 Pain in right toe(s): Secondary | ICD-10-CM

## 2018-04-14 DIAGNOSIS — I129 Hypertensive chronic kidney disease with stage 1 through stage 4 chronic kidney disease, or unspecified chronic kidney disease: Secondary | ICD-10-CM | POA: Insufficient documentation

## 2018-04-14 DIAGNOSIS — F329 Major depressive disorder, single episode, unspecified: Secondary | ICD-10-CM | POA: Insufficient documentation

## 2018-04-14 DIAGNOSIS — M79675 Pain in left toe(s): Secondary | ICD-10-CM

## 2018-04-14 DIAGNOSIS — K219 Gastro-esophageal reflux disease without esophagitis: Secondary | ICD-10-CM | POA: Insufficient documentation

## 2018-04-14 DIAGNOSIS — G2581 Restless legs syndrome: Secondary | ICD-10-CM | POA: Insufficient documentation

## 2018-04-14 DIAGNOSIS — T148XXA Other injury of unspecified body region, initial encounter: Secondary | ICD-10-CM

## 2018-04-14 DIAGNOSIS — D649 Anemia, unspecified: Secondary | ICD-10-CM | POA: Insufficient documentation

## 2018-04-14 DIAGNOSIS — K5792 Diverticulitis of intestine, part unspecified, without perforation or abscess without bleeding: Secondary | ICD-10-CM | POA: Insufficient documentation

## 2018-04-14 DIAGNOSIS — J4530 Mild persistent asthma, uncomplicated: Secondary | ICD-10-CM | POA: Insufficient documentation

## 2018-04-14 DIAGNOSIS — G4733 Obstructive sleep apnea (adult) (pediatric): Secondary | ICD-10-CM | POA: Insufficient documentation

## 2018-04-14 NOTE — Progress Notes (Signed)
Subjective: 67 year old female presents the office today for diabetic foot evaluation as well as for elongated, thick toenails that she cannot cut herself.  She actually try to cut her toenails last week herself and she cut her skin on her right second toe.  She has been given antibiotic ointment and a bandage on it daily he denies any redness or drainage or any swelling  Denies any systemic complaints such as fevers, chills, nausea, vomiting. No acute changes since last appointment, and no other complaints at this time.   Objective: AAO x3, NAD DP/PT pulses palpable bilaterally, CRT less than 3 seconds Status post partial left hallux imitation as well as total left second toe".  The remaining toenails are hypertrophic, dystrophic with yellow to brown discoloration.  Small superficial abrasion to the right second toe where she cut herself.  There is no edema, erythema or any other signs of infection noted today.  No open lesions. No open lesions or pre-ulcerative lesions.  No pain with calf compression, swelling, warmth, erythema  Assessment: Symptomatic onychomycosis with superficial abrasion right second toe  Plan: -All treatment options discussed with the patient including all alternatives, risks, complications.  -Debrided the nails x8 without any complications or bleeding -No other open sores identified except the small superficial abrasion of the right second toe.  Recommended antibiotic ointment and a bandage daily.  Appears to be almost healed at this time without any signs of infection.  Continue to monitor daily.  Offloading all times. -Follow-up in 3 months or sooner if needed -Patient encouraged to call the office with any questions, concerns, change in symptoms.   Trula Slade DPM

## 2018-04-18 ENCOUNTER — Telehealth: Payer: Self-pay

## 2018-04-18 MED ORDER — METFORMIN HCL 1000 MG PO TABS: ORAL_TABLET | ORAL | 0 refills | Status: DC

## 2018-04-18 NOTE — Telephone Encounter (Signed)
refill 

## 2018-04-22 DIAGNOSIS — Z90710 Acquired absence of both cervix and uterus: Secondary | ICD-10-CM | POA: Diagnosis not present

## 2018-04-22 DIAGNOSIS — Z780 Asymptomatic menopausal state: Secondary | ICD-10-CM | POA: Diagnosis not present

## 2018-04-22 DIAGNOSIS — E349 Endocrine disorder, unspecified: Secondary | ICD-10-CM | POA: Diagnosis not present

## 2018-05-28 DIAGNOSIS — H25013 Cortical age-related cataract, bilateral: Secondary | ICD-10-CM | POA: Diagnosis not present

## 2018-05-28 DIAGNOSIS — H2513 Age-related nuclear cataract, bilateral: Secondary | ICD-10-CM | POA: Diagnosis not present

## 2018-05-29 ENCOUNTER — Ambulatory Visit: Payer: BC Managed Care – PPO | Admitting: Internal Medicine

## 2018-05-29 ENCOUNTER — Encounter: Payer: Self-pay | Admitting: Internal Medicine

## 2018-05-29 VITALS — BP 140/82 | HR 80 | Ht 64.0 in | Wt 219.0 lb

## 2018-05-29 DIAGNOSIS — IMO0002 Reserved for concepts with insufficient information to code with codable children: Secondary | ICD-10-CM

## 2018-05-29 DIAGNOSIS — E1165 Type 2 diabetes mellitus with hyperglycemia: Secondary | ICD-10-CM

## 2018-05-29 DIAGNOSIS — Z7984 Long term (current) use of oral hypoglycemic drugs: Secondary | ICD-10-CM | POA: Diagnosis not present

## 2018-05-29 DIAGNOSIS — E785 Hyperlipidemia, unspecified: Secondary | ICD-10-CM | POA: Diagnosis not present

## 2018-05-29 DIAGNOSIS — E1142 Type 2 diabetes mellitus with diabetic polyneuropathy: Secondary | ICD-10-CM

## 2018-05-29 DIAGNOSIS — M47812 Spondylosis without myelopathy or radiculopathy, cervical region: Secondary | ICD-10-CM | POA: Diagnosis not present

## 2018-05-29 DIAGNOSIS — E1129 Type 2 diabetes mellitus with other diabetic kidney complication: Secondary | ICD-10-CM | POA: Diagnosis not present

## 2018-05-29 LAB — POCT GLYCOSYLATED HEMOGLOBIN (HGB A1C): Hemoglobin A1C: 7.1 % — AB (ref 4.0–5.6)

## 2018-05-29 NOTE — Progress Notes (Signed)
Patient ID: [name], female   DOB: 01-May-1951, 68 y.o.   MRN: [id number]   HPI: [name] is a 68 y.o.-year-old female, initially referred by her PCP, Dr. Harley Alto for f/u for DM2, dx in 1980s, non-insulin-dependent, controlled, with complications (PN - s/p B digit amputations 2/2 osteomyelitis, PAD, foot ulcer). She saw Dr. Elyse Hsu previously. Last visit with me 66 months ago.  She had toe surgery (tendon resection) after she developed an infection in her toe.  Her sugars were higher around the time of her surgery, in the 200s.  At that time, she called [place] and we discussed about restarting glipizide.  Afterwards, we were able to stop glipizide especially if she had a low blood sugar episode, but we restarted it again in 03/2018 when blood sugars were increased during the URI.   She pulled a muscle in R upper arm.  She has significant pain and cannot raise her arm very high.  She could not sleep last night and sugars in the morning are high (202).  She takes Tylenol and occasional ibuprofen.  Last hemoglobin A1c was: Lab Results  Component Value Date   HGBA1C 6.7 (A) 01/27/2018   HGBA1C 7.3 (A) 09/24/2017   HGBA1C 7.0 05/27/2017  02/16/2016: 7.1% 06/2015: 6.6%  Pt is on a regimen of: - Metformin ER 1000 mg 2x a day  - Jardiance 25 mg in am >> 12.5 mg daily due to CKD - Trulicity 1.5 mg weekly  We started Glipizide 5 mg 2x a day - restarted 03/2018 when sugars increased after URI >> now off for ~ 1 mo She was on Victoza >> rash. In 09/2016, she had a steroid inj in back >> sugars 200s >> used Novolog for 3 days.   Pt checks her sugars once a day: - am: 113, 125-157 >> 120-144, 170, 244 (infection) >> 120-140, 202 (pain) - 2h after b'fast: 137 >> 125-137, 171 >> 117-168, 182 - before lunch: 115-140 >> 111-140, 180 (infection) >> 176, 180 - 2h after lunch:  127-140 >> 130-152 >> 120-160, 175 - before dinner: 127-140 >> 127-135, 200 (infection) >> 130, 159 - 2h after  dinner: 100-147, 179 >> 117, 145  >> 137-155 - bedtime:  125-145 >> 150, 165 >> 140-160 >> 272 - nighttime: n/c Lowest sugar was 111 >> 117; she has hypoglycemia awareness in the 90s. Highest sugar was 244 >> 272 x1.  Glucometer: One Touch Verio  Pt's meals are: - Breakfast: [place] bacon, 2 eggs, toast; oatmeal; or brown rice - Lunch: Tuna, crackers or sandwich, chicken strips, chips, diet Pepsi - Dinner: Meat, veggies, starch  - Snacks: 2-3: 15g carbs (pretzels, chips)  She walks or rides her stationary bike 4 times a week - now 2x a day.  -+ CKD, last BUN/creatinine:  03/21/2018: CMP normal with the exception of high glucose at 143, BUN/creatinine 37/1.26, GFR 43 (kidney function improved from 09/2017) Urinalysis with 1+ protein Lab Results  Component Value Date   BUN 33 (H) 01/27/2018   BUN 31 (H) 04/23/2017   CREATININE 1.42 (H) 01/27/2018   CREATININE 1.25 (H) 04/23/2017  09/13/2017: Glucose 123, BUN 44/creatinine 1.34, GFR 40, previously 44 03/06/2017: ACR 222.8 08/29/2016: ACR 76.7 02/16/2016: 42/1.34 (GFR 40), Glu 213  12/26/2015: 25/1.23 (GFR 44), Glu 153 On lisinopril.  -+ HL; last set of lipids: 03/21/2018: 92/202/31/21 09/04/2017: 126/269/33/39 08/29/2016: 87/132/32/30 02/16/2016: 128/278/33/39  03/02/2014: 114/175/32/48 No results found for: CHOL, HDL, LDLCALC, LDLDIRECT, TRIG, CHOLHDL On Zocor. - last eye  exam was in 2019: No DR (Dr. Gershon Crane).  She had cataract surgery. -She has numbness and tingling in her feet.  Sees podiatry (Dr. Jacqualyn Posey).  She also has a history of HTN, GERD, anemia.  She had amputation of 1/2 of the left hallux 05/06/2017.  This is now healed. She started Humira (for psoriasis) but did not tolerate this >> stopped. She started PT for her back pain. Also dry needling.   ROS: Constitutional: no weight gain/no weight loss, no fatigue, no subjective hyperthermia, no subjective hypothermia Eyes: no blurry vision, no xerophthalmia ENT: no  sore throat, no nodules palpated in neck, no dysphagia, no odynophagia, no hoarseness Cardiovascular: no CP/no SOB/no palpitations/no leg swelling Respiratory: no cough/no SOB/no wheezing Gastrointestinal: no N/no V/no D/no C/no acid reflux Musculoskeletal: + muscle aches/+ joint aches Skin: no rashes, + hair loss Neurological: no tremors/no numbness/no tingling/no dizziness  I reviewed pt's medications, allergies, PMH, social hx, family hx, and changes were documented in the history of present illness. Otherwise, unchanged from my initial visit note.  Past Medical History:  Diagnosis Date  . Anemia in chronic renal disease 06/05/2011  . Anxiety    takes Ativan tid  . Asthma    has an inhaler prn  . Cough   . DDD (degenerative disc disease), cervical 06/05/2011  . DDD (degenerative disc disease), lumbosacral 06/05/2011  . Depression    takes Cymbalta daily  . Diabetes mellitus    takes Amaryl and Metformin daily  . Diabetic neuropathy ([place])   . Diverticulosis   . Dizziness   . GERD (gastroesophageal reflux disease)    takes Protonix prn  . Headache(784.0)    occasionally  . History of bronchitis    last time 63yrs ago  . History of colon polyps   . History of MRSA infection   . HTN (hypertension) 06/05/2011   takes Prinizide daily  . Hyperlipidemia    takes Zocor daily  . Insomnia    not on any meds at present time  . Osteoarthritis   . PONV (postoperative nausea and vomiting)    hard to wake up  . Psoriasis 06/05/2011   on legs and uses cream prn  . Seasonal allergies    but doesn't take any meds  . Sleep apnea    uses CPAP  . Tubular adenoma of colon    Past Surgical History:  Procedure Laterality Date  . ABDOMINAL HYSTERECTOMY  1982 and 1984  . AMPUTATION  02/12/2012   Procedure: AMPUTATION RAY;  Surgeon: Wylene Simmer, MD;  Location: [facility];  Service: Orthopedics;  Laterality: Right;  RIGHT 2ND TOE AMPUTATION   . BACK SURGERY     x 3  . CARDIAC CATHETERIZATION      08/09/04: 50% mid AV groove CX lesion, NL LM, LAD, Ramus, EF > 60% (Dr. Gwenlyn Found)  . CARPAL TUNNEL RELEASE Bilateral 2010  . CERVICAL SPINE SURGERY    . CHOLECYSTECTOMY  1977  . COLONOSCOPY    . DILATION AND CURETTAGE OF UTERUS    . ELBOW SURGERY     left d/t tendonitis  . ESOPHAGOGASTRODUODENOSCOPY    . LUMBAR WOUND DEBRIDEMENT N/A 07/15/2012   Procedure: I & D of Lumbar Wound: Possible Repair of CSF Leak;  Surgeon: Elaina Hoops, MD;  Location: [facility];  Service: Neurosurgery;  Laterality: N/A;  I & D of Lumbar Wound: Possible Repair of CSF Leak placement lumbar drain  . right knee arthroscopy     x 2  .  SHOULDER ARTHROSCOPY Left   . TOE AMPUTATION Left    Social History   Social History  . Marital status: Married    Spouse name: N/A  . Number of children: 2   Occupational History  . Disabled    Social History Main Topics  . Smoking status: Never Smoker  . Smokeless tobacco: Never Used     Comment: never used tobacco  . Alcohol use No  . Drug use: No   Current Outpatient Medications on File Prior to Visit  Medication Sig Dispense Refill  . Adalimumab (HUMIRA PEN-PS/UV/ADOL HS START) 80 MG/0.8ML & 40MG /0.4ML PNKT     . albuterol (PROVENTIL HFA;VENTOLIN HFA) 108 (90 BASE) MCG/ACT inhaler Inhale 1 puff into the lungs as needed for shortness of breath.     Marland Kitchen aspirin 81 MG tablet Take 81 mg by mouth daily.    . Aspirin-Calcium Carbonate 81-777 MG TABS Take by mouth.    . chlorpheniramine-HYDROcodone (TUSSIONEX) 10-8 MG/5ML SUER TK 5 MLS PO Q 12 H PRN  0  . clindamycin (CLEOCIN) 300 MG capsule Take 1 capsule (300 mg total) by mouth 3 (three) times daily. 30 capsule 2  . clobetasol (OLUX) 0.05 % topical foam Apply 1 application topically daily as needed (apply to head for dry scalp).     . COLLAGEN-VITAMIN C PO Take by mouth daily.    Marland Kitchen doxycycline (ADOXA) 100 MG tablet TK 1 T PO BID FOR 10 DAYS  0  . DULoxetine (CYMBALTA) 30 MG capsule Take 90 mg by mouth daily.  5  .  FLUoxetine (PROZAC) 20 MG tablet TK 1 T PO QD  3  . JARDIANCE 25 MG TABS tablet Take a half a tablet every day. 90 tablet 3  . lisinopril-hydrochlorothiazide (PRINZIDE,ZESTORETIC) 20-25 MG per tablet Take 1 tablet by mouth daily.    Marland Kitchen LORazepam (ATIVAN) 1 MG tablet Take 1 mg by mouth daily as needed.     . meperidine (DEMEROL) 50 MG tablet Take 1 tablet (50 mg total) by mouth every 6 (six) hours as needed. 20 tablet 0  . metFORMIN (GLUCOPHAGE) 1000 MG tablet TAKE 1 TABLET BY MOUTH TWICE A DAY WITH A MEAL 180 tablet 0  . metoprolol succinate (TOPROL-XL) 25 MG 24 hr tablet     . Multiple Vitamin (MULTI-VITAMIN DAILY PO) Take 1 tablet by mouth daily.     . Multiple Vitamins-Minerals (MULTIVITAMIN WITH MINERALS) tablet Take by mouth.    . ondansetron (ZOFRAN) 8 MG tablet TAKE 1 TABLET BY MOUTH 3 TIMES A DAY AS NEEDED NAUSEA (INS PAYS #18/21 DAYS)    . ONETOUCH VERIO test strip USE TO TEST BLOOD SUGARS 2 TIMES DAILY (DX: E11.65) 100 each 2  . pantoprazole (PROTONIX) 40 MG tablet Take 40 mg by mouth daily as needed (for reflux). For acid reflux    . Probiotic Product (PROBIOTIC DAILY PO) Take by mouth. Probiotic plus daily    . ranitidine (ZANTAC) 150 MG tablet Take 1 tablet (150 mg total) by mouth every evening. As needed. 30 tablet 2  . silver sulfADIAZINE (SILVADENE) 1 % cream Apply 1 application topically daily. 50 g 0  . simvastatin (ZOCOR) 40 MG tablet Take 40 mg by mouth at bedtime.     . traMADol (ULTRAM) 50 MG tablet TAKE 1 OR 2 TABLETS BY MOUTH EVERY 6 TO 8 HOURS AS NEEDED FOR PAIN    . TRULICITY 1.5 UR/4.2HC SOPN INJECT ONCE A WEEK 12 pen 3   No current facility-administered medications  on file prior to visit.    Allergies  Allergen Reactions  . Victoza [Liraglutide]     Severe rash and hives.  . Dilaudid [Hydromorphone]     Rash and swelling  . Ultram [Tramadol] Rash  . Betadine [Povidone Iodine] Other (See Comments)    Skin peels  . Bydureon [Exenatide] Diarrhea and Nausea And  Vomiting  . Codeine     Severe headaches  . Contrast Media [Iodinated Diagnostic Agents] Nausea And Vomiting  . Humira [Adalimumab] Hives  . Hydrocodone Other (See Comments)    headache  . Iodine Hives    Iodine hives  . Januvia [Sitagliptin] Diarrhea  . Morphine And Related Nausea And Vomiting  . Percocet [Oxycodone-Acetaminophen] Other (See Comments)    Headache   . Septra [Sulfamethoxazole-Trimethoprim]     rash  . Sulfa Antibiotics Itching  . Ultram [Tramadol Hcl]   . Valium Other (See Comments)    headache  . Vicodin [Hydrocodone-Acetaminophen] Other (See Comments)    headache  . Amoxicillin Hives and Rash  . Darvocet [Propoxyphene N-Acetaminophen] Rash and Other (See Comments)    headache  . Fentanyl And Related Rash  . Wellbutrin [Bupropion Hcl] Rash   Family History  Problem Relation Age of Onset  . Colon cancer Brother 36  . Hypertension Mother        died from infection s/p hip surgery  . Heart attack Father   . Colon cancer Maternal Grandfather   . Stomach cancer Neg Hx   . Esophageal cancer Neg Hx   . Rectal cancer Neg Hx   . Liver cancer Neg Hx     PE: BP 140/82   Pulse 80   Ht 5\' 4"  (1.626 m) Comment: measured  Wt 219 lb (99.3 kg)   SpO2 98%   BMI 37.59 kg/m  Wt Readings from Last 3 Encounters:  05/29/18 219 lb (99.3 kg)  01/27/18 216 lb (98 kg)  09/24/17 218 lb 12.8 oz (99.2 kg)   Constitutional: overweight, in NAD Eyes: PERRLA, EOMI, no exophthalmos ENT: moist mucous membranes, no thyromegaly, no cervical lymphadenopathy Cardiovascular: RRR, No MRG Respiratory: CTA B Gastrointestinal: abdomen soft, NT, ND, BS+ Musculoskeletal: no deformities, strength intact in all 4 Skin: moist, warm, no rashes Neurological: no tremor with outstretched hands, DTR normal in all 4  ASSESSMENT: 1. DM2, non-insulin-dependent, controlled, with complications: - PN - s/p B 2nd toe amputations 2/2 osteomyelitis - 2013 and 2015; 1/2 hallux 04/2017 - PAD -  foot ulcer  2. HL  3.  Yeast vaginitis  PLAN:  1. Patient longstanding, previously uncontrolled diabetes, with better control in the last 1.5 years.  She continues on oral antidiabetic regimen and Trulicity.  At last visit, we were able to stop glipizide as sugars were better and she even had one hypoglycemic episode.  However, since then, we restarted glipizide in 03/2018 and she had a URI then and sugars are higher.  At that time, however, her kidney function was worse and we had to decrease Jardiance to 12.5 mg daily.  She continues on this dose. - We will need to stay on the lower dose of Jardiance since we had to decrease it twice in the past due to worsening CKD. -At this visit, sugars are slightly worse, probably due to the holidays but also need to her shoulder pain. -We discussed about improving her diet, but I do not feel that we need to absolutely change her regimen now.  I also advised her to increase her  exercise.  Currently she is using a stationary bike 4 times a week, twice a day.  We discussed about starting walking. - I suggested to:  Patient Instructions  Please continue: - Metformin ER 1000 mg 2x a day, with meals - Trulicity 1.5 mg weekly  - Jardiance 12.5 mg daily before b'fast  Please come back for a follow-up appointment in 4 months.  - today, HbA1c is 7.1% (higher) - continue checking sugars at different times of the day - check 1x a day, rotating checks - advised for yearly eye exams >> she is UTD - Return to clinic in 4 mo with sugar log     2. HL - Reviewed latest lipid panel from 03/21/2018: 92/202/31/21.  LDL at goal, triglycerides high, HDL low. - Continues the statin without side effects.  3.  Yeast vaginitis -Likely related to [place] -Resolved -Continue Jardiance at the lower dose   Philemon Kingdom, MD PhD [facility]

## 2018-05-29 NOTE — Patient Instructions (Signed)
Please continue: - Metformin ER 1000 mg 2x a day, with meals - Trulicity 1.5 mg weekly  - Jardiance 12.5 mg daily before b'fast  Please come back for a follow-up appointment in 4 months.

## 2018-06-05 ENCOUNTER — Other Ambulatory Visit: Payer: Self-pay | Admitting: Family Medicine

## 2018-06-05 ENCOUNTER — Ambulatory Visit
Admission: RE | Admit: 2018-06-05 | Discharge: 2018-06-05 | Disposition: A | Discharge status: Home / Self Care | Payer: PPO | Source: Ambulatory Visit | Attending: Family Medicine | Admitting: Family Medicine

## 2018-06-05 DIAGNOSIS — R52 Pain, unspecified: Secondary | ICD-10-CM

## 2018-06-05 DIAGNOSIS — M25512 Pain in left shoulder: Secondary | ICD-10-CM | POA: Diagnosis not present

## 2018-06-05 DIAGNOSIS — R0781 Pleurodynia: Secondary | ICD-10-CM | POA: Diagnosis not present

## 2018-06-05 DIAGNOSIS — R0789 Other chest pain: Secondary | ICD-10-CM | POA: Diagnosis not present

## 2018-06-05 DIAGNOSIS — M47812 Spondylosis without myelopathy or radiculopathy, cervical region: Secondary | ICD-10-CM | POA: Diagnosis not present

## 2018-06-11 DIAGNOSIS — H2512 Age-related nuclear cataract, left eye: Secondary | ICD-10-CM | POA: Diagnosis not present

## 2018-06-11 DIAGNOSIS — H25012 Cortical age-related cataract, left eye: Secondary | ICD-10-CM | POA: Diagnosis not present

## 2018-06-11 DIAGNOSIS — H2511 Age-related nuclear cataract, right eye: Secondary | ICD-10-CM | POA: Diagnosis not present

## 2018-06-11 DIAGNOSIS — H25011 Cortical age-related cataract, right eye: Secondary | ICD-10-CM | POA: Diagnosis not present

## 2018-06-16 ENCOUNTER — Other Ambulatory Visit: Payer: Self-pay | Admitting: Internal Medicine

## 2018-06-18 DIAGNOSIS — M542 Cervicalgia: Secondary | ICD-10-CM | POA: Diagnosis not present

## 2018-06-18 DIAGNOSIS — M7542 Impingement syndrome of left shoulder: Secondary | ICD-10-CM | POA: Diagnosis not present

## 2018-06-25 DIAGNOSIS — H25011 Cortical age-related cataract, right eye: Secondary | ICD-10-CM | POA: Diagnosis not present

## 2018-06-25 DIAGNOSIS — H2511 Age-related nuclear cataract, right eye: Secondary | ICD-10-CM | POA: Diagnosis not present

## 2018-07-07 ENCOUNTER — Other Ambulatory Visit: Payer: Self-pay | Admitting: Internal Medicine

## 2018-07-14 ENCOUNTER — Ambulatory Visit: Payer: PPO | Admitting: Podiatry

## 2018-07-14 DIAGNOSIS — B351 Tinea unguium: Secondary | ICD-10-CM

## 2018-07-14 DIAGNOSIS — E1149 Type 2 diabetes mellitus with other diabetic neurological complication: Secondary | ICD-10-CM

## 2018-07-14 DIAGNOSIS — Z89422 Acquired absence of other left toe(s): Secondary | ICD-10-CM

## 2018-07-14 NOTE — Progress Notes (Signed)
Subjective: 68 year old female presents the office today for diabetic foot evaluation as well as for elongated, thick toenails that she cannot cut herself.  She states she relates she has been doing well she denies any open sores.  She has not cut her toenails.  She has no ulcerations.  She has no other concerns today. Denies any systemic complaints such as fevers, chills, nausea, vomiting. No acute changes since last appointment, and no other complaints at this time.   Objective: AAO x3, NAD DP/PT pulses palpable bilaterally, CRT less than 3 seconds  The remaining toenails are hypertrophic, dystrophic with yellow to brown discoloration.  There is no edema, erythema or any other signs of infection noted today.  No open lesions. No open lesions or pre-ulcerative lesions.  No pain with calf compression, swelling, warmth, erythema  Assessment: Symptomatic onychomycosis, type 2 diabetes with history of amputation  Plan: -All treatment options discussed with the patient including all alternatives, risks, complications.  -Debrided the nails x8 without any complications or bleeding -No other open sores identified except the small superficial abrasion of the right second toe.  Recommended antibiotic ointment and a bandage daily.  Appears to be almost healed at this time without any signs of infection.  Continue to monitor daily.  Offloading all times. -Follow-up in 3 months or sooner if needed -Patient encouraged to call the office with any questions, concerns, change in symptoms.   Trula Slade DPM

## 2018-07-15 ENCOUNTER — Other Ambulatory Visit: Payer: Self-pay | Admitting: Internal Medicine

## 2018-08-15 ENCOUNTER — Encounter: Payer: Self-pay | Admitting: Internal Medicine

## 2018-08-27 ENCOUNTER — Encounter: Payer: Self-pay | Admitting: Internal Medicine

## 2018-09-04 DIAGNOSIS — N3090 Cystitis, unspecified without hematuria: Secondary | ICD-10-CM | POA: Diagnosis not present

## 2018-09-18 ENCOUNTER — Encounter: Payer: Self-pay | Admitting: Internal Medicine

## 2018-09-18 ENCOUNTER — Other Ambulatory Visit: Payer: Self-pay | Admitting: Internal Medicine

## 2018-09-18 MED ORDER — SEMAGLUTIDE (1 MG/DOSE) 2 MG/1.5ML ~~LOC~~ SOPN: 1.0000 mg | PEN_INJECTOR | SUBCUTANEOUS | 5 refills | Status: DC

## 2018-09-19 DIAGNOSIS — Z6837 Body mass index (BMI) 37.0-37.9, adult: Secondary | ICD-10-CM | POA: Diagnosis not present

## 2018-09-19 DIAGNOSIS — I129 Hypertensive chronic kidney disease with stage 1 through stage 4 chronic kidney disease, or unspecified chronic kidney disease: Secondary | ICD-10-CM | POA: Diagnosis not present

## 2018-09-19 DIAGNOSIS — N183 Chronic kidney disease, stage 3 (moderate): Secondary | ICD-10-CM | POA: Diagnosis not present

## 2018-09-19 DIAGNOSIS — Z7984 Long term (current) use of oral hypoglycemic drugs: Secondary | ICD-10-CM | POA: Diagnosis not present

## 2018-09-19 DIAGNOSIS — E1129 Type 2 diabetes mellitus with other diabetic kidney complication: Secondary | ICD-10-CM | POA: Diagnosis not present

## 2018-09-19 DIAGNOSIS — G4733 Obstructive sleep apnea (adult) (pediatric): Secondary | ICD-10-CM | POA: Diagnosis not present

## 2018-09-19 DIAGNOSIS — E782 Mixed hyperlipidemia: Secondary | ICD-10-CM | POA: Diagnosis not present

## 2018-09-19 DIAGNOSIS — F322 Major depressive disorder, single episode, severe without psychotic features: Secondary | ICD-10-CM | POA: Diagnosis not present

## 2018-09-22 ENCOUNTER — Encounter: Payer: Self-pay | Admitting: Internal Medicine

## 2018-09-26 ENCOUNTER — Other Ambulatory Visit: Payer: Self-pay

## 2018-09-30 ENCOUNTER — Encounter: Payer: Self-pay | Admitting: Internal Medicine

## 2018-09-30 ENCOUNTER — Other Ambulatory Visit: Payer: Self-pay

## 2018-09-30 ENCOUNTER — Ambulatory Visit (INDEPENDENT_AMBULATORY_CARE_PROVIDER_SITE_OTHER): Payer: PPO | Admitting: Internal Medicine

## 2018-09-30 VITALS — BP 142/68 | HR 78 | Ht 64.0 in | Wt 215.0 lb

## 2018-09-30 DIAGNOSIS — E1165 Type 2 diabetes mellitus with hyperglycemia: Secondary | ICD-10-CM

## 2018-09-30 DIAGNOSIS — E785 Hyperlipidemia, unspecified: Secondary | ICD-10-CM | POA: Diagnosis not present

## 2018-09-30 DIAGNOSIS — E1142 Type 2 diabetes mellitus with diabetic polyneuropathy: Secondary | ICD-10-CM

## 2018-09-30 DIAGNOSIS — IMO0002 Reserved for concepts with insufficient information to code with codable children: Secondary | ICD-10-CM

## 2018-09-30 LAB — POCT GLYCOSYLATED HEMOGLOBIN (HGB A1C): Hemoglobin A1C: 8 % — AB (ref 4.0–5.6)

## 2018-09-30 MED ORDER — GLIPIZIDE 5 MG PO TABS: 2.5000 mg | ORAL_TABLET | Freq: Two times a day (BID) | ORAL | 3 refills | Status: DC

## 2018-09-30 NOTE — Patient Instructions (Signed)
Please continue: - Metformin ER 1000 mg 2x a day, with meals - Ozempic 1 mg weekly - Jardiance 12.5 mg daily before b'fast  Please restart: - Glipizide 2.5 mg 2x a day before meals. Stop if sugars improve on the higher dose of Ozempic.  Please come back for a follow-up appointment in 4 months.

## 2018-09-30 NOTE — Progress Notes (Signed)
Patient ID: [name], female   DOB: 02-10-1951, 68 y.o.   MRN: [id number]   HPI: [name] is a 68 y.o.-year-old female, initially referred by her PCP, Dr. Harley Alto for f/u for DM2, dx in 1980s, non-insulin-dependent, controlled, with complications (PN - s/p B digit amputations 2/2 osteomyelitis, PAD, foot ulcer). She saw Dr. Elyse Hsu previously. Last visit with me 4 months ago.  She had UTI few weeks ago. Just finished ABx. Sugars were higher, now slowly improving.  She had a steroid inj in shoulder in 06/2018 >> CBG 247  Last hemoglobin A1c was: Lab Results  Component Value Date   HGBA1C 7.1 (A) 05/29/2018   HGBA1C 6.7 (A) 01/27/2018   HGBA1C 7.3 (A) 09/24/2017  02/16/2016: 7.1% 06/2015: 6.6%  Pt is on a regimen of: - Metformin ER 1000 mg 2x a day  - Jardiance 25 mg in am >>  12.5 mg daily due to CKD - Trulicity 1.5 mg weekly  >> Ozempic 1 mg weekly (started last week) We started Glipizide 5 mg 2x a day - restarted 03/2018 when sugars increased after URI >> now off for ~ 1 mo She was on Victoza >> rash. In 09/2016, she had a steroid inj in back >> sugars 200s >> used Novolog for 3 days.   Pt checks her sugars once a day: - am: 120-144, 170, 244 >> 120-140, 202 (pain) >> 156-160, 247 (steroids) - 2h after b'fast: 125-137, 171 >> 117-168, 182 >> 148, 234 - before lunch: 111-140, 180 (infection) >> 176, 180 >> 150-175 - 2h after lunch:  130-152 >> 120-160, 175 >> 145-160 - before dinner:  127-135, 200 >> 130, 159 >> 145-170, 224 - 2h after dinner: 117, 145  >> 137-155 >> 150-171, 262 - bedtime:  125-145 >> 150, 165 >> 140-160 >> 272 >> 180 - nighttime: n/c Lowest sugar was 111 >> 117 >> 140 (last 2 mo); she has hypoglycemia awareness in the 90s. Highest sugar was 244 >> 272 x1 >> 247 (steroids)  Glucometer: One Touch Verio  Pt's meals are: - Breakfast: [place] bacon, 2 eggs, toast; oatmeal; or brown rice - Lunch: Tuna, crackers or sandwich, chicken strips,  chips, diet Pepsi - Dinner: Meat, veggies, starch  - Snacks: 2-3: 15g carbs (pretzels, chips)  She walks or rides her stationary bike for exercise.  -+ CKD, last BUN/creatinine:  03/21/2018: CMP normal with the exception of high glucose at 143, BUN/creatinine 37/1.26, GFR 43 (kidney function improved from 09/2017) Urinalysis with 1+ protein Lab Results  Component Value Date   BUN 33 (H) 01/27/2018   BUN 31 (H) 04/23/2017   CREATININE 1.42 (H) 01/27/2018   CREATININE 1.25 (H) 04/23/2017  09/13/2017: Glucose 123, BUN 44/creatinine 1.34, GFR 40, previously 44 03/06/2017: ACR 222.8 08/29/2016: ACR 76.7 02/16/2016: 42/1.34 (GFR 40), Glu 213  12/26/2015: 25/1.23 (GFR 44), Glu 153 On lisinopril.  -+HL; last set of lipids: 03/21/2018: 92/202/31/21 09/04/2017: 126/269/33/39 08/29/2016: 87/132/32/30 02/16/2016: 128/278/33/39  03/02/2014: 114/175/32/48 No results found for: CHOL, HDL, LDLCALC, LDLDIRECT, TRIG, CHOLHDL On Zocor.   - last eye exam was in 2019: No DR (Dr. Gershon Crane).  She had cataract surgery.  -+ Numbness and tingling in her feet.  Sees podiatry (Dr. Jacqualyn Posey).  She also has a history of HTN, GERD, anemia.  She had amputation of 1/2 of the left hallux 05/06/2017.  This is now healed. She started Humira (for psoriasis) but did not tolerate this >> stopped. She started PT for her back pain. Also dry  needling.   ROS: Constitutional: no weight gain/no weight loss, no fatigue, no subjective hyperthermia, no subjective hypothermia Eyes: no blurry vision, no xerophthalmia ENT: no sore throat, no nodules palpated in neck, no dysphagia, no odynophagia, no hoarseness Cardiovascular: no CP/no SOB/no palpitations/no leg swelling Respiratory: no cough/no SOB/no wheezing Gastrointestinal: no N/no V/no D/no C/no acid reflux Musculoskeletal: no muscle aches/no joint aches Skin: no rashes, no hair loss Neurological: no tremors/no numbness/no tingling/no dizziness  I reviewed pt's  medications, allergies, PMH, social hx, family hx, and changes were documented in the history of present illness. Otherwise, unchanged from my initial visit note.  Past Medical History:  Diagnosis Date  . Anemia in chronic renal disease 06/05/2011  . Anxiety    takes Ativan tid  . Asthma    has an inhaler prn  . Cough   . DDD (degenerative disc disease), cervical 06/05/2011  . DDD (degenerative disc disease), lumbosacral 06/05/2011  . Depression    takes Cymbalta daily  . Diabetes mellitus    takes Amaryl and Metformin daily  . Diabetic neuropathy ([place])   . Diverticulosis   . Dizziness   . GERD (gastroesophageal reflux disease)    takes Protonix prn  . Headache(784.0)    occasionally  . History of bronchitis    last time 31yrs ago  . History of colon polyps   . History of MRSA infection   . HTN (hypertension) 06/05/2011   takes Prinizide daily  . Hyperlipidemia    takes Zocor daily  . Insomnia    not on any meds at present time  . Osteoarthritis   . PONV (postoperative nausea and vomiting)    hard to wake up  . Psoriasis 06/05/2011   on legs and uses cream prn  . Seasonal allergies    but doesn't take any meds  . Sleep apnea    uses CPAP  . Tubular adenoma of colon    Past Surgical History:  Procedure Laterality Date  . ABDOMINAL HYSTERECTOMY  1982 and 1984  . AMPUTATION  02/12/2012   Procedure: AMPUTATION RAY;  Surgeon: Wylene Simmer, MD;  Location: [facility];  Service: Orthopedics;  Laterality: Right;  RIGHT 2ND TOE AMPUTATION   . BACK SURGERY     x 3  . CARDIAC CATHETERIZATION     08/09/04: 50% mid AV groove CX lesion, NL LM, LAD, Ramus, EF > 60% (Dr. Gwenlyn Found)  . CARPAL TUNNEL RELEASE Bilateral 2010  . CERVICAL SPINE SURGERY    . CHOLECYSTECTOMY  1977  . COLONOSCOPY    . DILATION AND CURETTAGE OF UTERUS    . ELBOW SURGERY     left d/t tendonitis  . ESOPHAGOGASTRODUODENOSCOPY    . LUMBAR WOUND DEBRIDEMENT N/A 07/15/2012   Procedure: I & D of Lumbar Wound: Possible  Repair of CSF Leak;  Surgeon: Elaina Hoops, MD;  Location: [facility];  Service: Neurosurgery;  Laterality: N/A;  I & D of Lumbar Wound: Possible Repair of CSF Leak placement lumbar drain  . right knee arthroscopy     x 2  . SHOULDER ARTHROSCOPY Left   . TOE AMPUTATION Left    Social History   Social History  . Marital status: Married    Spouse name: N/A  . Number of children: 2   Occupational History  . Disabled    Social History Main Topics  . Smoking status: Never Smoker  . Smokeless tobacco: Never Used     Comment: never used tobacco  . Alcohol  use No  . Drug use: No   Current Outpatient Medications on File Prior to Visit  Medication Sig Dispense Refill  . Adalimumab (HUMIRA PEN-PS/UV/ADOL HS START) 80 MG/0.8ML & 40MG /0.4ML PNKT     . albuterol (PROVENTIL HFA;VENTOLIN HFA) 108 (90 BASE) MCG/ACT inhaler Inhale 1 puff into the lungs as needed for shortness of breath.     Marland Kitchen aspirin 81 MG tablet Take 81 mg by mouth daily.    . Aspirin-Calcium Carbonate 81-777 MG TABS Take by mouth.    . chlorpheniramine-HYDROcodone (TUSSIONEX) 10-8 MG/5ML SUER TK 5 MLS PO Q 12 H PRN  0  . clindamycin (CLEOCIN) 300 MG capsule Take 1 capsule (300 mg total) by mouth 3 (three) times daily. 30 capsule 2  . clobetasol (OLUX) 0.05 % topical foam Apply 1 application topically daily as needed (apply to head for dry scalp).     . COLLAGEN-VITAMIN C PO Take by mouth daily.    Marland Kitchen doxycycline (ADOXA) 100 MG tablet TK 1 T PO BID FOR 10 DAYS  0  . DULoxetine (CYMBALTA) 30 MG capsule Take 90 mg by mouth daily.  5  . FLUoxetine (PROZAC) 20 MG tablet TK 1 T PO QD  3  . glucose blood (ONETOUCH VERIO) test strip TEST TWICE A DAY 100 each 2  . JARDIANCE 25 MG TABS tablet Take a half a tablet every day. 90 tablet 3  . lisinopril-hydrochlorothiazide (PRINZIDE,ZESTORETIC) 20-25 MG per tablet Take 1 tablet by mouth daily.    Marland Kitchen LORazepam (ATIVAN) 1 MG tablet Take 1 mg by mouth daily as needed.     . meperidine  (DEMEROL) 50 MG tablet Take 1 tablet (50 mg total) by mouth every 6 (six) hours as needed. 20 tablet 0  . metFORMIN (GLUCOPHAGE) 1000 MG tablet TAKE 1 TABLET BY MOUTH TWICE DAILY WITH A MEAL 180 tablet 0  . metoprolol succinate (TOPROL-XL) 25 MG 24 hr tablet     . Multiple Vitamin (MULTI-VITAMIN DAILY PO) Take 1 tablet by mouth daily.     . Multiple Vitamins-Minerals (MULTIVITAMIN WITH MINERALS) tablet Take by mouth.    . ondansetron (ZOFRAN) 8 MG tablet TAKE 1 TABLET BY MOUTH 3 TIMES A DAY AS NEEDED NAUSEA (INS PAYS #18/21 DAYS)    . pantoprazole (PROTONIX) 40 MG tablet Take 40 mg by mouth daily as needed (for reflux). For acid reflux    . Probiotic Product (PROBIOTIC DAILY PO) Take by mouth. Probiotic plus daily    . ranitidine (ZANTAC) 150 MG tablet Take 1 tablet (150 mg total) by mouth every evening. As needed. 30 tablet 2  . Semaglutide, 1 MG/DOSE, (OZEMPIC, 1 MG/DOSE,) 2 MG/1.5ML SOPN Inject 1 mg into the skin once a week. 1 pen 5  . silver sulfADIAZINE (SILVADENE) 1 % cream Apply 1 application topically daily. 50 g 0  . simvastatin (ZOCOR) 40 MG tablet Take 40 mg by mouth at bedtime.     . traMADol (ULTRAM) 50 MG tablet TAKE 1 OR 2 TABLETS BY MOUTH EVERY 6 TO 8 HOURS AS NEEDED FOR PAIN     No current facility-administered medications on file prior to visit.    Allergies  Allergen Reactions  . Victoza [Liraglutide]     Severe rash and hives.  . Dilaudid [Hydromorphone]     Rash and swelling  . Ultram [Tramadol] Rash  . Betadine [Povidone Iodine] Other (See Comments)    Skin peels  . Bydureon [Exenatide] Diarrhea and Nausea And Vomiting  . Codeine  Severe headaches  . Contrast Media [Iodinated Diagnostic Agents] Nausea And Vomiting  . Humira [Adalimumab] Hives  . Hydrocodone Other (See Comments)    headache  . Iodine Hives    Iodine hives  . Januvia [Sitagliptin] Diarrhea  . Morphine And Related Nausea And Vomiting  . Percocet [Oxycodone-Acetaminophen] Other (See Comments)     Headache   . Septra [Sulfamethoxazole-Trimethoprim]     rash  . Sulfa Antibiotics Itching  . Ultram [Tramadol Hcl]   . Valium Other (See Comments)    headache  . Vicodin [Hydrocodone-Acetaminophen] Other (See Comments)    headache  . Amoxicillin Hives and Rash  . Darvocet [Propoxyphene N-Acetaminophen] Rash and Other (See Comments)    headache  . Fentanyl And Related Rash  . Wellbutrin [Bupropion Hcl] Rash   Family History  Problem Relation Age of Onset  . Colon cancer Brother 65  . Hypertension Mother        died from infection s/p hip surgery  . Heart attack Father   . Colon cancer Maternal Grandfather   . Stomach cancer Neg Hx   . Esophageal cancer Neg Hx   . Rectal cancer Neg Hx   . Liver cancer Neg Hx     PE: BP (!) 142/68   Pulse 78   Ht 5\' 4"  (1.626 m)   Wt 215 lb (97.5 kg)   SpO2 97%   BMI 36.90 kg/m  Wt Readings from Last 3 Encounters:  09/30/18 215 lb (97.5 kg)  05/29/18 219 lb (99.3 kg)  01/27/18 216 lb (98 kg)   Constitutional: overweight, in NAD Eyes: PERRLA, EOMI, no exophthalmos ENT: moist mucous membranes, no thyromegaly, no cervical lymphadenopathy Cardiovascular: RRR, No MRG Respiratory: CTA B Gastrointestinal: abdomen soft, NT, ND, BS+ Musculoskeletal: no deformities, strength intact in all 4 Skin: moist, warm, no rashes Neurological: no tremor with outstretched hands, DTR normal in all 4  ASSESSMENT: 1. DM2, non-insulin-dependent, controlled, with complications: - PN - s/p B 2nd toe amputations 2/2 osteomyelitis - 2013 and 2015; 1/2 hallux 04/2017 - PAD - foot ulcer  2. HL  3.  Yeast vaginitis  PLAN:  1. Patient with longstanding, previously uncontrolled diabetes, with better control in the last 3 years.  She continues on metformin, if she has to take it better, and GLP-1 receptor agonist.  Since last visit, she had a UTI and also URI.  She was on antibiotics and sugars were quite high, in the upper 100s and 200s.  She contacted  me about this and we increased her Ozempic to 1 mg weekly.  She was started to improve but they are not baseline yet.  We discussed about adding a low-dose glipizide, 2.5 mg twice a day before meals but, if sugars do not improve, she may need to increase the dose to 5 mg twice a day.  Otherwise, if the sugars do improve especially since the higher dose of Ozempic accumulating in her system, I advised her to stop glipizide. - I suggested to:  Patient Instructions  Please continue: - Metformin ER 1000 mg 2x a day, with meals - Ozempic 1 mg weekly - Jardiance 12.5 mg daily before b'fast  Please restart: - Glipizide 2.5 mg 2x a day before meals. Stop if sugars improve on the higher dose of Ozempic.  Please come back for a follow-up appointment in 4 months.  - today, HbA1c is 8% (higher) - continue checking sugars at different times of the day - check 1-2x a day, rotating checks -  advised for yearly eye exams >> she is UTD - Return to clinic in 4 mo with sugar log    2. HL - Reviewed latest lipid panel from 03/2018: Triglycerides high, LDL low - Continues the statin without side effects.  3.  Yeast vaginitis -Likely related to Jardiance -We are using Jardiance at a lower dose because of this and also b/c CKD, although this improved   Philemon Kingdom, MD PhD [facility] Endocrinology

## 2018-10-06 DIAGNOSIS — E559 Vitamin D deficiency, unspecified: Secondary | ICD-10-CM | POA: Diagnosis not present

## 2018-10-06 DIAGNOSIS — I129 Hypertensive chronic kidney disease with stage 1 through stage 4 chronic kidney disease, or unspecified chronic kidney disease: Secondary | ICD-10-CM | POA: Diagnosis not present

## 2018-10-06 DIAGNOSIS — E782 Mixed hyperlipidemia: Secondary | ICD-10-CM | POA: Diagnosis not present

## 2018-10-13 ENCOUNTER — Other Ambulatory Visit: Payer: Self-pay

## 2018-10-13 ENCOUNTER — Encounter: Payer: Self-pay | Admitting: Podiatry

## 2018-10-13 ENCOUNTER — Ambulatory Visit: Payer: PPO | Admitting: Podiatry

## 2018-10-13 VITALS — Temp 97.1°F

## 2018-10-13 DIAGNOSIS — E1149 Type 2 diabetes mellitus with other diabetic neurological complication: Secondary | ICD-10-CM

## 2018-10-13 DIAGNOSIS — L853 Xerosis cutis: Secondary | ICD-10-CM

## 2018-10-13 DIAGNOSIS — M79674 Pain in right toe(s): Secondary | ICD-10-CM

## 2018-10-13 DIAGNOSIS — B351 Tinea unguium: Secondary | ICD-10-CM | POA: Diagnosis not present

## 2018-10-13 DIAGNOSIS — M79675 Pain in left toe(s): Secondary | ICD-10-CM

## 2018-10-13 DIAGNOSIS — R21 Rash and other nonspecific skin eruption: Secondary | ICD-10-CM

## 2018-10-13 MED ORDER — AMMONIUM LACTATE 12 % EX CREA: TOPICAL_CREAM | CUTANEOUS | 0 refills | Status: DC | PRN

## 2018-10-13 MED ORDER — TRIAMCINOLONE ACETONIDE 0.025 % EX OINT: 1.0000 "application " | TOPICAL_OINTMENT | Freq: Two times a day (BID) | CUTANEOUS | 0 refills | Status: DC

## 2018-10-13 NOTE — Progress Notes (Signed)
Subjective: 68 year old female presents the office today for diabetic foot evaluation as well as for elongated, thick toenails that she cannot cut herself.  She also has very dry skin to her feet.  She is also noticed a skin rash on top of her foot but denies any pain, itching or any open sores or drainage.  In general she does get foot pain if she is been on her feet all day.  She wears bedroom slippers at home.  She points monitors bunion on the left foot where she gets discomfort. Denies any systemic complaints such as fevers, chills, nausea, vomiting. No acute changes since last appointment, and no other complaints at this time.   Objective: AAO x3, NAD DP/PT pulses palpable bilaterally, CRT less than 3 seconds The remaining toenails are hypertrophic, dystrophic with yellow to brown discoloration.  There is no edema, erythema or any other signs of infection noted today.  No open lesions. Significantly dry skin is present there is no skin fissures or open sores.  On the dorsal aspect of the foot there is erythematous splotches.  There is no drainage or pus.  There is no swelling. Tailor's bunion left foot.  There is no area pinpoint tenderness identified today bilaterally there is no edema, erythema. No open lesions or pre-ulcerative lesions.  No pain with calf compression, swelling, warmth, erythema  Assessment: Symptomatic onychomycosis, type 2 diabetes with history of amputation; dry skin, skin rash  Plan: -All treatment options discussed with the patient including all alternatives, risks, complications.  -Debrided the nails x8 without any complications or bleeding -Prescribed AmLactin cream also Kenalog cream.  Discussed use. -Discussed wearing more supportive shoes even while at home.  Consider Voltaren gel if needed as well.  Return in about 3 months (around 01/13/2019).  Trula Slade DPM

## 2018-10-22 ENCOUNTER — Encounter: Payer: Self-pay | Admitting: Internal Medicine

## 2018-10-28 ENCOUNTER — Encounter: Payer: Self-pay | Admitting: Internal Medicine

## 2018-10-28 NOTE — Telephone Encounter (Signed)
Please review and advise.

## 2018-11-04 ENCOUNTER — Encounter: Payer: Self-pay | Admitting: Internal Medicine

## 2018-11-25 ENCOUNTER — Other Ambulatory Visit: Payer: Self-pay | Admitting: Internal Medicine

## 2018-12-05 ENCOUNTER — Other Ambulatory Visit: Payer: Self-pay | Admitting: Internal Medicine

## 2018-12-09 DIAGNOSIS — Z1231 Encounter for screening mammogram for malignant neoplasm of breast: Secondary | ICD-10-CM | POA: Diagnosis not present

## 2018-12-09 DIAGNOSIS — Z803 Family history of malignant neoplasm of breast: Secondary | ICD-10-CM | POA: Diagnosis not present

## 2019-01-13 ENCOUNTER — Ambulatory Visit: Payer: PPO | Admitting: Podiatry

## 2019-01-22 ENCOUNTER — Ambulatory Visit (INDEPENDENT_AMBULATORY_CARE_PROVIDER_SITE_OTHER): Payer: PPO | Admitting: Podiatry

## 2019-01-22 ENCOUNTER — Other Ambulatory Visit: Payer: Self-pay

## 2019-01-22 DIAGNOSIS — B351 Tinea unguium: Secondary | ICD-10-CM | POA: Diagnosis not present

## 2019-01-22 DIAGNOSIS — E1149 Type 2 diabetes mellitus with other diabetic neurological complication: Secondary | ICD-10-CM

## 2019-01-22 DIAGNOSIS — L84 Corns and callosities: Secondary | ICD-10-CM

## 2019-01-22 DIAGNOSIS — M79674 Pain in right toe(s): Secondary | ICD-10-CM

## 2019-01-22 DIAGNOSIS — M79675 Pain in left toe(s): Secondary | ICD-10-CM | POA: Diagnosis not present

## 2019-01-22 NOTE — Progress Notes (Signed)
Subjective: 68 year old female presents the office today for diabetic foot evaluation as well as for elongated, thick toenails that she cannot cut herself.  She states that she was getting some pain of the right third toe.  She describes a sharp pain to the tip of the toe.  No open sores that she can identify no swelling.  She states her last A1c was 8.  The cream was also plan is been helpful.  Objective: AAO x3, NAD-presents today wearing tighter clothes and dress shoes. DP/PT pulses palpable bilaterally, CRT less than 3 seconds The remaining toenails are hypertrophic, dystrophic with yellow to brown discoloration x8.  There is no edema, erythema or any other signs of infection noted today.  No open lesions. Rigid hammertoe contractures are present resulted in a hyperkeratotic lesion of the distal aspect of the right third toe. Ulceration drainage or any signs of infection.  Hyperkeratotic lesion on the medial aspect of the right foot on the bunion.  No ongoing ulceration.  No open sores identified. No pain with calf compression, swelling, warmth, erythema  Assessment: Symptomatic onychomycosis, type 2 diabetes with history of amputation  Plan: -All treatment options discussed with the patient including all alternatives, risks, complications.  -Debrided the nails x8 without any complications or bleeding -Debrided hyperkeratotic lesions x2 without any complications or bleeding. -Dispensed offloading pad to the right third toe. -Continue moisturizer. -Discussed wearing different shoes.  Return in about 3 months (around 01/13/2019).  Trula Slade DPM

## 2019-01-22 NOTE — Patient Instructions (Signed)
Diabetes Mellitus and Foot Care Foot care is an important part of your health, especially when you have diabetes. Diabetes may cause you to have problems because of poor blood flow (circulation) to your feet and legs, which can cause your skin to:  Become thinner and drier.  Break more easily.  Heal more slowly.  Peel and crack. You may also have nerve damage (neuropathy) in your legs and feet, causing decreased feeling in them. This means that you may not notice minor injuries to your feet that could lead to more serious problems. Noticing and addressing any potential problems early is the best way to prevent future foot problems. How to care for your feet Foot hygiene  Wash your feet daily with warm water and mild soap. Do not use hot water. Then, pat your feet and the areas between your toes until they are completely dry. Do not soak your feet as this can dry your skin.  Trim your toenails straight across. Do not dig under them or around the cuticle. File the edges of your nails with an emery board or nail file.  Apply a moisturizing lotion or petroleum jelly to the skin on your feet and to dry, brittle toenails. Use lotion that does not contain alcohol and is unscented. Do not apply lotion between your toes. Shoes and socks  Wear clean socks or stockings every day. Make sure they are not too tight. Do not wear knee-high stockings since they may decrease blood flow to your legs.  Wear shoes that fit properly and have enough cushioning. Always look in your shoes before you put them on to be sure there are no objects inside.  To break in new shoes, wear them for just a few hours a day. This prevents injuries on your feet. Wounds, scrapes, corns, and calluses  Check your feet daily for blisters, cuts, bruises, sores, and redness. If you cannot see the bottom of your feet, use a mirror or ask someone for help.  Do not cut corns or calluses or try to remove them with medicine.  If you  find a minor scrape, cut, or break in the skin on your feet, keep it and the skin around it clean and dry. You may clean these areas with mild soap and water. Do not clean the area with peroxide, alcohol, or iodine.  If you have a wound, scrape, corn, or callus on your foot, look at it several times a day to make sure it is healing and not infected. Check for: ? Redness, swelling, or pain. ? Fluid or blood. ? Warmth. ? Pus or a bad smell. General instructions  Do not cross your legs. This may decrease blood flow to your feet.  Do not use heating pads or hot water bottles on your feet. They may burn your skin. If you have lost feeling in your feet or legs, you may not know this is happening until it is too late.  Protect your feet from hot and cold by wearing shoes, such as at the beach or on hot pavement.  Schedule a complete foot exam at least once a year (annually) or more often if you have foot problems. If you have foot problems, report any cuts, sores, or bruises to your health care provider immediately. Contact a health care provider if:  You have a medical condition that increases your risk of infection and you have any cuts, sores, or bruises on your feet.  You have an injury that is not   healing.  You have redness on your legs or feet.  You feel burning or tingling in your legs or feet.  You have pain or cramps in your legs and feet.  Your legs or feet are numb.  Your feet always feel cold.  You have pain around a toenail. Get help right away if:  You have a wound, scrape, corn, or callus on your foot and: ? You have pain, swelling, or redness that gets worse. ? You have fluid or blood coming from the wound, scrape, corn, or callus. ? Your wound, scrape, corn, or callus feels warm to the touch. ? You have pus or a bad smell coming from the wound, scrape, corn, or callus. ? You have a fever. ? You have a red line going up your leg. Summary  Check your feet every day  for cuts, sores, red spots, swelling, and blisters.  Moisturize feet and legs daily.  Wear shoes that fit properly and have enough cushioning.  If you have foot problems, report any cuts, sores, or bruises to your health care provider immediately.  Schedule a complete foot exam at least once a year (annually) or more often if you have foot problems. This information is not intended to replace advice given to you by your health care provider. Make sure you discuss any questions you have with your health care provider. Document Released: 04/20/2000 Document Revised: 06/05/2017 Document Reviewed: 05/25/2016 Elsevier Patient Education  2020 [employer]  

## 2019-02-04 ENCOUNTER — Other Ambulatory Visit: Payer: Self-pay

## 2019-02-04 ENCOUNTER — Ambulatory Visit (INDEPENDENT_AMBULATORY_CARE_PROVIDER_SITE_OTHER): Payer: PPO | Admitting: Internal Medicine

## 2019-02-04 ENCOUNTER — Encounter: Payer: Self-pay | Admitting: Internal Medicine

## 2019-02-04 VITALS — BP 122/68 | HR 81 | Ht 64.0 in | Wt 214.0 lb

## 2019-02-04 DIAGNOSIS — IMO0002 Reserved for concepts with insufficient information to code with codable children: Secondary | ICD-10-CM

## 2019-02-04 DIAGNOSIS — Z23 Encounter for immunization: Secondary | ICD-10-CM

## 2019-02-04 DIAGNOSIS — E1142 Type 2 diabetes mellitus with diabetic polyneuropathy: Secondary | ICD-10-CM

## 2019-02-04 DIAGNOSIS — E785 Hyperlipidemia, unspecified: Secondary | ICD-10-CM | POA: Diagnosis not present

## 2019-02-04 DIAGNOSIS — E1165 Type 2 diabetes mellitus with hyperglycemia: Secondary | ICD-10-CM

## 2019-02-04 LAB — LIPID PANEL
Cholesterol: 110 mg/dL (ref 0–200)
HDL: 31.6 mg/dL — ABNORMAL LOW (ref >39.00)
NonHDL: 78.36
Total CHOL/HDL Ratio: 3
Triglycerides: 255 mg/dL — ABNORMAL HIGH (ref 0.0–149.0)
VLDL: 51 mg/dL — ABNORMAL HIGH (ref 0.0–40.0)

## 2019-02-04 LAB — LDL CHOLESTEROL, DIRECT: Direct LDL: 44 mg/dL

## 2019-02-04 LAB — MICROALBUMIN / CREATININE URINE RATIO
Creatinine,U: 47.8 mg/dL
Microalb Creat Ratio: 9.4 mg/g (ref 0.0–30.0)
Microalb, Ur: 4.5 mg/dL — ABNORMAL HIGH (ref 0.0–1.9)

## 2019-02-04 LAB — POCT GLYCOSYLATED HEMOGLOBIN (HGB A1C): Hemoglobin A1C: 8.4 % — AB (ref 4.0–5.6)

## 2019-02-04 MED ORDER — TRESIBA FLEXTOUCH 200 UNIT/ML ~~LOC~~ SOPN: 10.0000 [IU] | PEN_INJECTOR | Freq: Every day | SUBCUTANEOUS | 5 refills | Status: DC

## 2019-02-04 MED ORDER — INSULIN PEN NEEDLE 32G X 4 MM MISC: 3 refills | Status: DC

## 2019-02-04 NOTE — Patient Instructions (Signed)
Please continue: - Ozempic 1 mg weekly - Jardiance 12.5 mg daily before b'fast - Glipizide 2.5 mg 2x a day before meals  Please start: - Tresiba 10 units daily. Increase by 2 units every 4 days until sugars in am <140, or until you reach 24 units.  When injecting insulin:  Inject in the abdomen  Rotate the injection sites around the belly button  Change needle for each injection  Keep needle in for 10 sec after last unit of insulin in  Keep the insulin in use out of the fridge  Please come back for a follow-up appointment in 3 months.

## 2019-02-04 NOTE — Progress Notes (Addendum)
Patient ID: [name], female   DOB: 03/27/1951, 68 y.o.   MRN: [id number]   HPI: [name] is a 68 y.o.-year-old female, initially referred by her PCP, Dr. Harley Alto for f/u for DM2, dx in 1980s, non-insulin-dependent, controlled, with complications (PN - s/p B digit amputations 2/2 osteomyelitis, PAD, foot ulcer). She saw Dr. Elyse Hsu previously. Last visit with me 4 months ago.  Patient contacted me since last visit that her sugars are higher and I suggested to start long-acting insulin.  However, she refused.  At this visit, sugars are higher.  Reviewed HbA1c levels: Lab Results  Component Value Date   HGBA1C 8.0 (A) 09/30/2018   HGBA1C 7.1 (A) 05/29/2018   HGBA1C 6.7 (A) 01/27/2018  02/16/2016: 7.1% 06/2015: 6.6%  Pt is on a regimen of: - Ozempic 1 mg weekly - Jardiance 12.5 mg daily before b'fast - Glipizide 2.5 mg before b'fast - added back 09/2018 She refused to start insulin. She stopped Metformin ER >> AP and diarrhea She was on Victoza >> rash. In 09/2016, she had a steroid inj in back >> sugars 200s >> used Novolog for 3 days.   Pt checks her sugars once a day: - am:  120-140, 202 (pain) >> 156-160, 247 (steroids) >> 149, 164-216 - 2h after b'fast: 125-137, 171 >> 117-168, 182 >> 148, 234 >> 164 - before lunch: 111-140, 180 >> 176, 180 >> 150-175 >> 145--204, 251 - 2h after lunch:  130-152 >> 120-160, 175 >> 145-160 >> 150 - before dinner:  127-135, 200 >> 130, 159 >> 145-170, 224 >> 150, 243 - 2h after dinner: 117, 145  >> 137-155 >> 150-171, 262 >> n/c - bedtime:  125-145 >> 150, 165 >> 140-160 >> 272 >> 180 >> n/c - nighttime: n/c Lowest sugar was 111 >> 117 >> 140 >> 135; she has hypoglycemia awareness in the 90s. Highest sugar was 244 >> 272 x1 >> 247 (steroids) >> 251.  Glucometer: One Touch Verio  Pt's meals are: - Breakfast: [place] bacon, 2 eggs, toast; oatmeal; or brown rice - Lunch: Tuna, crackers or sandwich, chicken strips, chips, diet  [employer] - Dinner: Meat, veggies, starch  - Snacks: 2-3: 15g carbs (pretzels, chips)  She walks or rides her stationary bike for exercise.  -+ CKD, last BUN/creatinine:  03/21/2018: CMP normal with the exception of high glucose at 143, BUN/creatinine 37/1.26, GFR 43 (kidney function improved from 09/2017) Urinalysis with 1+ protein Lab Results  Component Value Date   BUN 33 (H) 01/27/2018   BUN 31 (H) 04/23/2017   CREATININE 1.42 (H) 01/27/2018   CREATININE 1.25 (H) 04/23/2017  09/13/2017: Glucose 123, BUN 44/creatinine 1.34, GFR 40, previously 44 03/06/2017: ACR 222.8 08/29/2016: ACR 76.7 02/16/2016: 42/1.34 (GFR 40), Glu 213  12/26/2015: 25/1.23 (GFR 44), Glu 153 On lisinopril.  -+ HL; last set of lipids: 03/21/2018: 92/202/31/21 09/04/2017: 126/269/33/39 08/29/2016: 87/132/32/30 02/16/2016: 128/278/33/39  03/02/2014: 114/175/32/48 No results found for: CHOL, HDL, LDLCALC, LDLDIRECT, TRIG, CHOLHDL On Zocor.   - last eye exam was in 2019: No DR (Dr. Gershon Crane).  She has a history of cataract surgery. Appt scheduled already.  -+ Numbness and tingling in her feet.  She sees podiatry-Dr. Jacqualyn Posey.  She also has a history of HTN, GERD, anemia.  She had amputation of 1/2 of the left hallux 05/06/2017.  This is now healed. She started Humira (for psoriasis) but did not tolerate this >> stopped. She started PT for her back pain. Also dry needling.   ROS:  Constitutional: no weight gain/no weight loss, no fatigue, no subjective hyperthermia, no subjective hypothermia Eyes: no blurry vision, no xerophthalmia ENT: no sore throat, no nodules palpated in neck, no dysphagia, no odynophagia, no hoarseness Cardiovascular: no CP/no SOB/no palpitations/no leg swelling Respiratory: no cough/no SOB/no wheezing Gastrointestinal: no N/no V/no D/no C/no acid reflux Musculoskeletal: no muscle aches/no joint aches Skin: no rashes, no hair loss Neurological: no tremors/+ numbness/+ tingling/no  dizziness  I reviewed pt's medications, allergies, PMH, social hx, family hx, and changes were documented in the history of present illness. Otherwise, unchanged from my initial visit note.  Past Medical History:  Diagnosis Date  . Anemia in chronic renal disease 06/05/2011  . Anxiety    takes Ativan tid  . Asthma    has an inhaler prn  . Cough   . DDD (degenerative disc disease), cervical 06/05/2011  . DDD (degenerative disc disease), lumbosacral 06/05/2011  . Depression    takes Cymbalta daily  . Diabetes mellitus    takes Amaryl and Metformin daily  . Diabetic neuropathy ([place])   . Diverticulosis   . Dizziness   . GERD (gastroesophageal reflux disease)    takes Protonix prn  . Headache(784.0)    occasionally  . History of bronchitis    last time 26yrs ago  . History of colon polyps   . History of MRSA infection   . HTN (hypertension) 06/05/2011   takes Prinizide daily  . Hyperlipidemia    takes Zocor daily  . Insomnia    not on any meds at present time  . Osteoarthritis   . PONV (postoperative nausea and vomiting)    hard to wake up  . Psoriasis 06/05/2011   on legs and uses cream prn  . Seasonal allergies    but doesn't take any meds  . Sleep apnea    uses CPAP  . Tubular adenoma of colon    Past Surgical History:  Procedure Laterality Date  . ABDOMINAL HYSTERECTOMY  1982 and 1984  . AMPUTATION  02/12/2012   Procedure: AMPUTATION RAY;  Surgeon: Wylene Simmer, MD;  Location: [place];  Service: Orthopedics;  Laterality: Right;  RIGHT 2ND TOE AMPUTATION   . BACK SURGERY     x 3  . CARDIAC CATHETERIZATION     08/09/04: 50% mid AV groove CX lesion, NL LM, LAD, Ramus, EF > 60% (Dr. Gwenlyn Found)  . CARPAL TUNNEL RELEASE Bilateral 2010  . CERVICAL SPINE SURGERY    . CHOLECYSTECTOMY  1977  . COLONOSCOPY    . DILATION AND CURETTAGE OF UTERUS    . ELBOW SURGERY     left d/t tendonitis  . ESOPHAGOGASTRODUODENOSCOPY    . LUMBAR WOUND DEBRIDEMENT N/A 07/15/2012   Procedure: I & D of  Lumbar Wound: Possible Repair of CSF Leak;  Surgeon: Elaina Hoops, MD;  Location: [facility];  Service: Neurosurgery;  Laterality: N/A;  I & D of Lumbar Wound: Possible Repair of CSF Leak placement lumbar drain  . right knee arthroscopy     x 2  . SHOULDER ARTHROSCOPY Left   . TOE AMPUTATION Left    Social History   Social History  . Marital status: Married    Spouse name: N/A  . Number of children: 2   Occupational History  . Disabled    Social History Main Topics  . Smoking status: Never Smoker  . Smokeless tobacco: Never Used     Comment: never used tobacco  . Alcohol use No  .  Drug use: No   Current Outpatient Medications on File Prior to Visit  Medication Sig Dispense Refill  . Adalimumab (HUMIRA PEN-PS/UV/ADOL HS START) 80 MG/0.8ML & 40MG /0.4ML PNKT     . albuterol (PROVENTIL HFA;VENTOLIN HFA) 108 (90 BASE) MCG/ACT inhaler Inhale 1 puff into the lungs as needed for shortness of breath.     Marland Kitchen ammonium lactate (AMLACTIN) 12 % cream Apply topically as needed for dry skin. 385 g 0  . aspirin 81 MG tablet Take 81 mg by mouth daily.    . Aspirin-Calcium Carbonate 81-777 MG TABS Take by mouth.    . chlorpheniramine-HYDROcodone (TUSSIONEX) 10-8 MG/5ML SUER TK 5 MLS PO Q 12 H PRN  0  . clindamycin (CLEOCIN) 300 MG capsule Take 1 capsule (300 mg total) by mouth 3 (three) times daily. 30 capsule 2  . clobetasol (OLUX) 0.05 % topical foam Apply 1 application topically daily as needed (apply to head for dry scalp).     . COLLAGEN-VITAMIN C PO Take by mouth daily.    Marland Kitchen doxycycline (ADOXA) 100 MG tablet TK 1 T PO BID FOR 10 DAYS  0  . DULoxetine (CYMBALTA) 30 MG capsule Take 90 mg by mouth daily.  5  . FLUoxetine (PROZAC) 20 MG tablet TK 1 T PO QD  3  . glipiZIDE (GLUCOTROL) 5 MG tablet Take 0.5 tablets (2.5 mg total) by mouth 2 (two) times daily before a meal. 60 tablet 3  . JARDIANCE 25 MG TABS tablet TAKE 1 TABLET BY MOUTH EVERY DAY 90 tablet 3  . lisinopril-hydrochlorothiazide  (PRINZIDE,ZESTORETIC) 20-25 MG per tablet Take 1 tablet by mouth daily.    Marland Kitchen LORazepam (ATIVAN) 1 MG tablet Take 1 mg by mouth daily as needed.     . meperidine (DEMEROL) 50 MG tablet Take 1 tablet (50 mg total) by mouth every 6 (six) hours as needed. 20 tablet 0  . metFORMIN (GLUCOPHAGE) 1000 MG tablet TAKE 1 TABLET BY MOUTH TWICE DAILY WITH A MEAL 180 tablet 0  . metoprolol succinate (TOPROL-XL) 25 MG 24 hr tablet     . Multiple Vitamin (MULTI-VITAMIN DAILY PO) Take 1 tablet by mouth daily.     . Multiple Vitamins-Minerals (MULTIVITAMIN WITH MINERALS) tablet Take by mouth.    . ondansetron (ZOFRAN) 8 MG tablet TAKE 1 TABLET BY MOUTH 3 TIMES A DAY AS NEEDED NAUSEA (INS PAYS #18/21 DAYS)    . ONETOUCH VERIO test strip TEST TWICE DAILY 100 strip 2  . pantoprazole (PROTONIX) 40 MG tablet Take 40 mg by mouth daily as needed (for reflux). For acid reflux    . Probiotic Product (PROBIOTIC DAILY PO) Take by mouth. Probiotic plus daily    . ranitidine (ZANTAC) 150 MG tablet Take 1 tablet (150 mg total) by mouth every evening. As needed. 30 tablet 2  . Semaglutide, 1 MG/DOSE, (OZEMPIC, 1 MG/DOSE,) 2 MG/1.5ML SOPN Inject 1 mg into the skin once a week. 1 pen 5  . silver sulfADIAZINE (SILVADENE) 1 % cream Apply 1 application topically daily. 50 g 0  . simvastatin (ZOCOR) 40 MG tablet Take 40 mg by mouth at bedtime.     . traMADol (ULTRAM) 50 MG tablet TAKE 1 OR 2 TABLETS BY MOUTH EVERY 6 TO 8 HOURS AS NEEDED FOR PAIN    . triamcinolone (KENALOG) 0.025 % ointment Apply 1 application topically 2 (two) times daily. 30 g 0   No current facility-administered medications on file prior to visit.    Allergies  Allergen Reactions  .  Victoza [Liraglutide]     Severe rash and hives.  . Dilaudid [Hydromorphone]     Rash and swelling  . Ultram [Tramadol] Rash  . Betadine [Povidone Iodine] Other (See Comments)    Skin peels  . Bydureon [Exenatide] Diarrhea and Nausea And Vomiting  . Codeine     Severe  headaches  . Contrast Media [Iodinated Diagnostic Agents] Nausea And Vomiting  . Humira [Adalimumab] Hives  . Hydrocodone Other (See Comments)    headache  . Iodine Hives    Iodine hives  . Januvia [Sitagliptin] Diarrhea  . Morphine And Related Nausea And Vomiting  . Percocet [Oxycodone-Acetaminophen] Other (See Comments)    Headache   . Septra [Sulfamethoxazole-Trimethoprim]     rash  . Sulfa Antibiotics Itching  . Ultram [Tramadol Hcl]   . Valium Other (See Comments)    headache  . Vicodin [Hydrocodone-Acetaminophen] Other (See Comments)    headache  . Amoxicillin Hives and Rash  . Darvocet [Propoxyphene N-Acetaminophen] Rash and Other (See Comments)    headache  . Fentanyl And Related Rash  . Wellbutrin [Bupropion Hcl] Rash   Family History  Problem Relation Age of Onset  . Colon cancer Brother 78  . Hypertension Mother        died from infection s/p hip surgery  . Heart attack Father   . Colon cancer Maternal Grandfather   . Stomach cancer Neg Hx   . Esophageal cancer Neg Hx   . Rectal cancer Neg Hx   . Liver cancer Neg Hx     PE: BP 122/68   Pulse 81   Ht 5\' 4"  (1.626 m)   Wt 214 lb (97.1 kg)   SpO2 97%   BMI 36.73 kg/m  Wt Readings from Last 3 Encounters:  02/04/19 214 lb (97.1 kg)  09/30/18 215 lb (97.5 kg)  05/29/18 219 lb (99.3 kg)   Constitutional: overweight, in NAD Eyes: PERRLA, EOMI, no exophthalmos ENT: moist mucous membranes, no thyromegaly, no cervical lymphadenopathy Cardiovascular: RRR, No MRG Respiratory: CTA B Gastrointestinal: abdomen soft, NT, ND, BS+ Musculoskeletal: no deformities, strength intact in all 4 Skin: moist, warm, no rashes Neurological: no tremor with outstretched hands, DTR normal in all 4  ASSESSMENT: 1. DM2, non-insulin-dependent, controlled, with complications: - PN - s/p B 2nd toe amputations 2/2 osteomyelitis - 2013 and 2015; 1/2 hallux 04/2017 - PAD - foot ulcer  2. HL  3.  Yeast vaginitis  PLAN:  1.  Patient with longstanding, previously uncontrolled diabetes, with improved control in the last 3 years, however, worsening control before last visit.  She is on oral medications and also high-dose weekly GLP-1 receptor agonist.  She contacted me before this visit with higher blood sugars and I suggested to add long-acting insulin, however, she decided to continue without the insulin. -At this visit, sugars are higher at almost all times of the day.  States she is already on a complex regimen (without metformin since she could not tolerate even the extended release dose), I explained that the only available option is long-acting insulin.  She agrees to try this.  We discussed about starting a long-acting insulin that would cover her sugars for 24 to 48 hours, depending on the type of insulin would start.  For now I suggested [place] which is longer acting and less conducive to blood sugar fluctuations.  If this is not covered, we can use another long-acting insulin, taken at bedtime.  We discussed about checking blood sugars more frequently after she  starts insulin.  We will continue the rest of the regimen but if she develops low blood sugars, will need to stop glipizide. - I suggested to:  Patient Instructions  Please continue: - Ozempic 1 mg weekly - Jardiance 12.5 mg daily before b'fast - Glipizide 2.5 mg 2x a day before meals  Please start: - Tresiba 10 units daily. Increase by 2 units every 4 days until sugars in am <140, or until you reach 24 units.  When injecting insulin:  Inject in the abdomen  Rotate the injection sites around the belly button  Change needle for each injection  Keep needle in for 10 sec after last unit of insulin in  Keep the insulin in use out of the fridge  Please come back for a follow-up appointment in 3 months.  - we checked her HbA1c: 8.4% (higher) - advised to check sugars at different times of the day - 1-2x a day, rotating check times - advised for  yearly eye exams >> she is UTD - will check annual labs today - + Flu shot today - return to clinic in 3 months    2. HL -Reviewed latest lipid panel from 03/2018: Triglycerides high, LDL at goal -Continue statin without side effects. -Recheck lipids today  3.  Yeast vaginitis -Most likely related to Jardiance -We are using Jardiance at the lower dose because of this and also because of her CKD -No active symptoms now  Component     Latest Ref Rng & Units 02/04/2019  Glucose     65 - 99 mg/dL 173 (H)  BUN     7 - 25 mg/dL 39 (H)  Creatinine     0.50 - 0.99 mg/dL 1.41 (H)  GFR, Est Non African American     > OR = 60 mL/min/1.23m2 39 (L)  GFR, Est African American     > OR = 60 mL/min/1.21m2 45 (L)  BUN/Creatinine Ratio     6 - 22 (calc) 28 (H)  Sodium     135 - 146 mmol/L 136  Potassium     3.5 - 5.3 mmol/L 4.5  Chloride     98 - 110 mmol/L 103  CO2     20 - 32 mmol/L 24  Calcium     8.6 - 10.4 mg/dL 9.3  Total Protein     6.1 - 8.1 g/dL 6.6  Albumin MSPROF     3.6 - 5.1 g/dL 4.3  Globulin     1.9 - 3.7 g/dL (calc) 2.3  AG Ratio     1.0 - 2.5 (calc) 1.9  Total Bilirubin     0.2 - 1.2 mg/dL 0.4  Alkaline phosphatase (APISO)     37 - 153 U/L 68  AST     10 - 35 U/L 11  ALT     6 - 29 U/L 13  Cholesterol     0 - 200 mg/dL 110  Triglycerides     0.0 - 149.0 mg/dL 255.0 (H)  HDL Cholesterol     >39.00 mg/dL 31.60 (L)  VLDL     0.0 - 40.0 mg/dL 51.0 (H)  Total CHOL/HDL Ratio      3  NonHDL      78.36  Microalb, Ur     0.0 - 1.9 mg/dL 4.5 (H)  Creatinine,U     mg/dL 47.8  MICROALB/CREAT RATIO     0.0 - 30.0 mg/g 9.4  Direct LDL     mg/dL 44.0  Glucose high.  Kidney function slightly lower.  Triglycerides higher.  She absolutely needs to insulin, which will help both her glucose and triglycerides.  Philemon Kingdom, MD PhD [facility] Endocrinology

## 2019-02-04 NOTE — Addendum Note (Signed)
Addended by: Cardell Peach I on: 02/04/2019 11:38 AM   Modules accepted: Orders

## 2019-02-05 LAB — COMPLETE METABOLIC PANEL WITH GFR
AG Ratio: 1.9 (calc) (ref 1.0–2.5)
ALT: 13 U/L (ref 6–29)
AST: 11 U/L (ref 10–35)
Albumin: 4.3 g/dL (ref 3.6–5.1)
Alkaline phosphatase (APISO): 68 U/L (ref 37–153)
BUN/Creatinine Ratio: 28 (calc) — ABNORMAL HIGH (ref 6–22)
BUN: 39 mg/dL — ABNORMAL HIGH (ref 7–25)
CO2: 24 mmol/L (ref 20–32)
Calcium: 9.3 mg/dL (ref 8.6–10.4)
Chloride: 103 mmol/L (ref 98–110)
Creat: 1.41 mg/dL — ABNORMAL HIGH (ref 0.50–0.99)
GFR, Est African American: 45 mL/min/{1.73_m2} — ABNORMAL LOW (ref >=60)
GFR, Est Non African American: 39 mL/min/{1.73_m2} — ABNORMAL LOW (ref >=60)
Globulin: 2.3 g/dL (calc) (ref 1.9–3.7)
Glucose, Bld: 173 mg/dL — ABNORMAL HIGH (ref 65–99)
Potassium: 4.5 mmol/L (ref 3.5–5.3)
Sodium: 136 mmol/L (ref 135–146)
Total Bilirubin: 0.4 mg/dL (ref 0.2–1.2)
Total Protein: 6.6 g/dL (ref 6.1–8.1)

## 2019-02-15 ENCOUNTER — Other Ambulatory Visit: Payer: Self-pay | Admitting: Internal Medicine

## 2019-02-18 ENCOUNTER — Encounter: Payer: Self-pay | Admitting: Internal Medicine

## 2019-02-20 ENCOUNTER — Encounter: Payer: Self-pay | Admitting: Internal Medicine

## 2019-03-09 ENCOUNTER — Other Ambulatory Visit: Payer: Self-pay

## 2019-03-09 MED ORDER — OZEMPIC (1 MG/DOSE) 2 MG/1.5ML ~~LOC~~ SOPN: 1.0000 mg | PEN_INJECTOR | SUBCUTANEOUS | 5 refills | Status: DC

## 2019-03-11 DIAGNOSIS — N183 Chronic kidney disease, stage 3 unspecified: Secondary | ICD-10-CM | POA: Insufficient documentation

## 2019-03-11 DIAGNOSIS — Z89421 Acquired absence of other right toe(s): Secondary | ICD-10-CM | POA: Insufficient documentation

## 2019-03-24 DIAGNOSIS — Z961 Presence of intraocular lens: Secondary | ICD-10-CM | POA: Diagnosis not present

## 2019-04-06 ENCOUNTER — Encounter: Payer: PPO | Admitting: Internal Medicine

## 2019-04-06 DIAGNOSIS — E782 Mixed hyperlipidemia: Secondary | ICD-10-CM | POA: Diagnosis not present

## 2019-04-06 DIAGNOSIS — L409 Psoriasis, unspecified: Secondary | ICD-10-CM | POA: Diagnosis not present

## 2019-04-06 DIAGNOSIS — Z89421 Acquired absence of other right toe(s): Secondary | ICD-10-CM | POA: Diagnosis not present

## 2019-04-06 DIAGNOSIS — E1129 Type 2 diabetes mellitus with other diabetic kidney complication: Secondary | ICD-10-CM | POA: Diagnosis not present

## 2019-04-06 DIAGNOSIS — I129 Hypertensive chronic kidney disease with stage 1 through stage 4 chronic kidney disease, or unspecified chronic kidney disease: Secondary | ICD-10-CM | POA: Diagnosis not present

## 2019-04-06 DIAGNOSIS — F322 Major depressive disorder, single episode, severe without psychotic features: Secondary | ICD-10-CM | POA: Diagnosis not present

## 2019-04-06 DIAGNOSIS — G4733 Obstructive sleep apnea (adult) (pediatric): Secondary | ICD-10-CM | POA: Diagnosis not present

## 2019-04-06 DIAGNOSIS — J45909 Unspecified asthma, uncomplicated: Secondary | ICD-10-CM | POA: Diagnosis not present

## 2019-04-06 DIAGNOSIS — Z0000 Encounter for general adult medical examination without abnormal findings: Secondary | ICD-10-CM | POA: Diagnosis not present

## 2019-04-06 DIAGNOSIS — N183 Chronic kidney disease, stage 3 unspecified: Secondary | ICD-10-CM | POA: Diagnosis not present

## 2019-04-06 DIAGNOSIS — M47812 Spondylosis without myelopathy or radiculopathy, cervical region: Secondary | ICD-10-CM | POA: Diagnosis not present

## 2019-04-09 ENCOUNTER — Other Ambulatory Visit: Payer: Self-pay | Admitting: Internal Medicine

## 2019-04-20 ENCOUNTER — Encounter: Payer: Self-pay | Admitting: Internal Medicine

## 2019-04-20 DIAGNOSIS — R300 Dysuria: Secondary | ICD-10-CM | POA: Diagnosis not present

## 2019-04-20 DIAGNOSIS — R319 Hematuria, unspecified: Secondary | ICD-10-CM | POA: Diagnosis not present

## 2019-04-21 DIAGNOSIS — R300 Dysuria: Secondary | ICD-10-CM | POA: Diagnosis not present

## 2019-04-23 ENCOUNTER — Ambulatory Visit: Payer: PPO | Admitting: Podiatry

## 2019-05-06 ENCOUNTER — Encounter: Payer: Self-pay | Admitting: Internal Medicine

## 2019-05-06 ENCOUNTER — Other Ambulatory Visit: Payer: Self-pay

## 2019-05-06 MED ORDER — ONETOUCH VERIO VI STRP: ORAL_STRIP | 11 refills | Status: DC

## 2019-05-08 ENCOUNTER — Other Ambulatory Visit: Payer: Self-pay

## 2019-05-08 ENCOUNTER — Encounter (HOSPITAL_BASED_OUTPATIENT_CLINIC_OR_DEPARTMENT_OTHER): Payer: Self-pay

## 2019-05-08 ENCOUNTER — Inpatient Hospital Stay (HOSPITAL_BASED_OUTPATIENT_CLINIC_OR_DEPARTMENT_OTHER)
Admission: EM | Admit: 2019-05-08 | Discharge: 2019-05-11 | DRG: 392 | Disposition: A | Discharge status: Home / Self Care | Payer: PPO | Attending: Internal Medicine | Admitting: Internal Medicine

## 2019-05-08 ENCOUNTER — Emergency Department (HOSPITAL_BASED_OUTPATIENT_CLINIC_OR_DEPARTMENT_OTHER): Payer: PPO

## 2019-05-08 DIAGNOSIS — Z885 Allergy status to narcotic agent status: Secondary | ICD-10-CM

## 2019-05-08 DIAGNOSIS — Z888 Allergy status to other drugs, medicaments and biological substances status: Secondary | ICD-10-CM

## 2019-05-08 DIAGNOSIS — I129 Hypertensive chronic kidney disease with stage 1 through stage 4 chronic kidney disease, or unspecified chronic kidney disease: Secondary | ICD-10-CM | POA: Diagnosis not present

## 2019-05-08 DIAGNOSIS — Z91041 Radiographic dye allergy status: Secondary | ICD-10-CM

## 2019-05-08 DIAGNOSIS — Z86010 Personal history of colonic polyps: Secondary | ICD-10-CM

## 2019-05-08 DIAGNOSIS — Z89422 Acquired absence of other left toe(s): Secondary | ICD-10-CM

## 2019-05-08 DIAGNOSIS — N179 Acute kidney failure, unspecified: Secondary | ICD-10-CM | POA: Diagnosis not present

## 2019-05-08 DIAGNOSIS — Z800 Family history of malignant neoplasm of digestive organs: Secondary | ICD-10-CM

## 2019-05-08 DIAGNOSIS — F329 Major depressive disorder, single episode, unspecified: Secondary | ICD-10-CM | POA: Diagnosis not present

## 2019-05-08 DIAGNOSIS — E785 Hyperlipidemia, unspecified: Secondary | ICD-10-CM | POA: Diagnosis present

## 2019-05-08 DIAGNOSIS — Z8249 Family history of ischemic heart disease and other diseases of the circulatory system: Secondary | ICD-10-CM

## 2019-05-08 DIAGNOSIS — E1122 Type 2 diabetes mellitus with diabetic chronic kidney disease: Secondary | ICD-10-CM | POA: Diagnosis present

## 2019-05-08 DIAGNOSIS — J302 Other seasonal allergic rhinitis: Secondary | ICD-10-CM | POA: Diagnosis not present

## 2019-05-08 DIAGNOSIS — Z89421 Acquired absence of other right toe(s): Secondary | ICD-10-CM

## 2019-05-08 DIAGNOSIS — G4733 Obstructive sleep apnea (adult) (pediatric): Secondary | ICD-10-CM | POA: Diagnosis present

## 2019-05-08 DIAGNOSIS — N183 Chronic kidney disease, stage 3 unspecified: Secondary | ICD-10-CM | POA: Diagnosis not present

## 2019-05-08 DIAGNOSIS — F419 Anxiety disorder, unspecified: Secondary | ICD-10-CM | POA: Diagnosis not present

## 2019-05-08 DIAGNOSIS — Z20822 Contact with and (suspected) exposure to covid-19: Secondary | ICD-10-CM | POA: Diagnosis present

## 2019-05-08 DIAGNOSIS — L409 Psoriasis, unspecified: Secondary | ICD-10-CM | POA: Diagnosis present

## 2019-05-08 DIAGNOSIS — Z8614 Personal history of Methicillin resistant Staphylococcus aureus infection: Secondary | ICD-10-CM | POA: Diagnosis not present

## 2019-05-08 DIAGNOSIS — I700 Atherosclerosis of aorta: Secondary | ICD-10-CM | POA: Diagnosis not present

## 2019-05-08 DIAGNOSIS — G2581 Restless legs syndrome: Secondary | ICD-10-CM | POA: Diagnosis present

## 2019-05-08 DIAGNOSIS — E1142 Type 2 diabetes mellitus with diabetic polyneuropathy: Secondary | ICD-10-CM | POA: Diagnosis present

## 2019-05-08 DIAGNOSIS — K5730 Diverticulosis of large intestine without perforation or abscess without bleeding: Secondary | ICD-10-CM | POA: Diagnosis not present

## 2019-05-08 DIAGNOSIS — K5792 Diverticulitis of intestine, part unspecified, without perforation or abscess without bleeding: Secondary | ICD-10-CM | POA: Diagnosis present

## 2019-05-08 DIAGNOSIS — M1990 Unspecified osteoarthritis, unspecified site: Secondary | ICD-10-CM | POA: Diagnosis present

## 2019-05-08 DIAGNOSIS — I10 Essential (primary) hypertension: Secondary | ICD-10-CM | POA: Diagnosis not present

## 2019-05-08 DIAGNOSIS — K5712 Diverticulitis of small intestine without perforation or abscess without bleeding: Secondary | ICD-10-CM | POA: Diagnosis not present

## 2019-05-08 DIAGNOSIS — Z9049 Acquired absence of other specified parts of digestive tract: Secondary | ICD-10-CM

## 2019-05-08 DIAGNOSIS — E1165 Type 2 diabetes mellitus with hyperglycemia: Secondary | ICD-10-CM | POA: Diagnosis not present

## 2019-05-08 DIAGNOSIS — IMO0002 Reserved for concepts with insufficient information to code with codable children: Secondary | ICD-10-CM | POA: Diagnosis present

## 2019-05-08 DIAGNOSIS — Z880 Allergy status to penicillin: Secondary | ICD-10-CM

## 2019-05-08 DIAGNOSIS — Z883 Allergy status to other anti-infective agents status: Secondary | ICD-10-CM

## 2019-05-08 DIAGNOSIS — J4530 Mild persistent asthma, uncomplicated: Secondary | ICD-10-CM | POA: Diagnosis not present

## 2019-05-08 DIAGNOSIS — Z8719 Personal history of other diseases of the digestive system: Secondary | ICD-10-CM | POA: Diagnosis not present

## 2019-05-08 DIAGNOSIS — Z7982 Long term (current) use of aspirin: Secondary | ICD-10-CM

## 2019-05-08 DIAGNOSIS — Z90710 Acquired absence of both cervix and uterus: Secondary | ICD-10-CM | POA: Diagnosis not present

## 2019-05-08 DIAGNOSIS — R197 Diarrhea, unspecified: Secondary | ICD-10-CM | POA: Diagnosis present

## 2019-05-08 DIAGNOSIS — Z794 Long term (current) use of insulin: Secondary | ICD-10-CM

## 2019-05-08 DIAGNOSIS — Z79899 Other long term (current) drug therapy: Secondary | ICD-10-CM

## 2019-05-08 DIAGNOSIS — Z882 Allergy status to sulfonamides status: Secondary | ICD-10-CM

## 2019-05-08 LAB — URINALYSIS, ROUTINE W REFLEX MICROSCOPIC
Bilirubin Urine: NEGATIVE
Glucose, UA: >=500 mg/dL — AB
Hgb urine dipstick: NEGATIVE
Ketones, ur: NEGATIVE mg/dL
Nitrite: NEGATIVE
Protein, ur: NEGATIVE mg/dL
Specific Gravity, Urine: 1.025 (ref 1.005–1.030)
pH: 5.5 (ref 5.0–8.0)

## 2019-05-08 LAB — SARS CORONAVIRUS 2 AG (30 MIN TAT): SARS Coronavirus 2 Ag: NEGATIVE

## 2019-05-08 LAB — COMPREHENSIVE METABOLIC PANEL
ALT: 12 U/L (ref 0–44)
AST: 11 U/L — ABNORMAL LOW (ref 15–41)
Albumin: 3.6 g/dL (ref 3.5–5.0)
Alkaline Phosphatase: 80 U/L (ref 38–126)
Anion gap: 12 (ref 5–15)
BUN: 73 mg/dL — ABNORMAL HIGH (ref 8–23)
CO2: 17 mmol/L — ABNORMAL LOW (ref 22–32)
Calcium: 8.9 mg/dL (ref 8.9–10.3)
Chloride: 102 mmol/L (ref 98–111)
Creatinine, Ser: 2.21 mg/dL — ABNORMAL HIGH (ref 0.44–1.00)
GFR calc Af Amer: 26 mL/min — ABNORMAL LOW (ref >60)
GFR calc non Af Amer: 22 mL/min — ABNORMAL LOW (ref >60)
Glucose, Bld: 291 mg/dL — ABNORMAL HIGH (ref 70–99)
Potassium: 5.3 mmol/L — ABNORMAL HIGH (ref 3.5–5.1)
Sodium: 131 mmol/L — ABNORMAL LOW (ref 135–145)
Total Bilirubin: 0.4 mg/dL (ref 0.3–1.2)
Total Protein: 6.4 g/dL — ABNORMAL LOW (ref 6.5–8.1)

## 2019-05-08 LAB — URINALYSIS, MICROSCOPIC (REFLEX)

## 2019-05-08 LAB — LACTIC ACID, PLASMA: Lactic Acid, Venous: 1.2 mmol/L (ref 0.5–1.9)

## 2019-05-08 LAB — CBC
HCT: 42.7 % (ref 36.0–46.0)
Hemoglobin: 14 g/dL (ref 12.0–15.0)
MCH: 31.3 pg (ref 26.0–34.0)
MCHC: 32.8 g/dL (ref 30.0–36.0)
MCV: 95.3 fL (ref 80.0–100.0)
Platelets: 399 10*3/uL (ref 150–400)
RBC: 4.48 MIL/uL (ref 3.87–5.11)
RDW: 13 % (ref 11.5–15.5)
WBC: 25.3 10*3/uL — ABNORMAL HIGH (ref 4.0–10.5)
nRBC: 0 % (ref 0.0–0.2)

## 2019-05-08 LAB — GLUCOSE, CAPILLARY: Glucose-Capillary: 189 mg/dL — ABNORMAL HIGH (ref 70–99)

## 2019-05-08 LAB — LIPASE, BLOOD: Lipase: 28 U/L (ref 11–51)

## 2019-05-08 LAB — CBG MONITORING, ED: Glucose-Capillary: 175 mg/dL — ABNORMAL HIGH (ref 70–99)

## 2019-05-08 MED ORDER — SODIUM CHLORIDE 0.9 % IV SOLN
2.0000 g | Freq: Once | INTRAVENOUS | Status: AC
  Administered 2019-05-08: 18:00:00 2 g via INTRAVENOUS
  Filled 2019-05-08: qty 20

## 2019-05-08 MED ORDER — SODIUM CHLORIDE 0.9 % IV BOLUS
1000.0000 mL | Freq: Once | INTRAVENOUS | Status: AC
  Administered 2019-05-08: 20:00:00 1000 mL via INTRAVENOUS

## 2019-05-08 MED ORDER — SODIUM CHLORIDE 0.9 % IV BOLUS
500.0000 mL | Freq: Once | INTRAVENOUS | Status: AC
  Administered 2019-05-09: 02:00:00 500 mL via INTRAVENOUS

## 2019-05-08 MED ORDER — METRONIDAZOLE IN NACL 5-0.79 MG/ML-% IV SOLN
500.0000 mg | Freq: Once | INTRAVENOUS | Status: AC
  Administered 2019-05-08: 17:00:00 500 mg via INTRAVENOUS
  Filled 2019-05-08: qty 100

## 2019-05-08 MED ORDER — SODIUM CHLORIDE 0.9 % IV BOLUS
500.0000 mL | Freq: Once | INTRAVENOUS | Status: AC
  Administered 2019-05-08: 17:00:00 500 mL via INTRAVENOUS

## 2019-05-08 MED ORDER — SODIUM CHLORIDE 0.9% FLUSH
3.0000 mL | Freq: Once | INTRAVENOUS | Status: DC
  Filled 2019-05-08: qty 3

## 2019-05-08 NOTE — ED Notes (Signed)
Blood cxs ordered and obtained after abx started

## 2019-05-08 NOTE — ED Provider Notes (Signed)
[name] Provider Note   CSN: [id number] Arrival date & time: 05/08/19  1419     History Chief Complaint  Patient presents with  . Diarrhea    [name] is a 69 y.o. female history of CKD, DDD, asthma, diabetes, diverticulosis, GERD, hypertension, hyperlipidemia, sleep apnea.  Patient presents today for 4 days of abdominal pain and diarrhea.  She reports that she began having nonbloody mucousy diarrhea 4 days ago, 9 episodes per day, small amounts.  Associated with abdominal cramping sensation moderate intensity diffuse nonradiating no aggravating or alleviating factors.  She reports she has felt nauseous with her abdominal pain and diarrhea but has not vomited.  She reports she has not been eating or drinking as well due to her nausea over the last few days.  Denies fever/chills, headache, chest pain/shortness of breath, cough/hemoptysis, vomiting, dysuria/hematuria, fall/injury, numbness/weakness, tingling or any additional concerns.  HPI     Past Medical History:  Diagnosis Date  . Anemia in chronic renal disease 06/05/2011  . Anxiety    takes Ativan tid  . Asthma    has an inhaler prn  . Cough   . DDD (degenerative disc disease), cervical 06/05/2011  . DDD (degenerative disc disease), lumbosacral 06/05/2011  . Depression    takes Cymbalta daily  . Diabetes mellitus    takes Amaryl and Metformin daily  . Diabetic neuropathy ([place])   . Diverticulosis   . Dizziness   . GERD (gastroesophageal reflux disease)    takes Protonix prn  . Headache(784.0)    occasionally  . History of bronchitis    last time 69yrs ago  . History of colon polyps   . History of MRSA infection   . HTN (hypertension) 06/05/2011   takes Prinizide daily  . Hyperlipidemia    takes Zocor daily  . Insomnia    not on any meds at present time  . Osteoarthritis   . PONV (postoperative nausea and vomiting)    hard to wake up  . Psoriasis 06/05/2011   on legs and  uses cream prn  . Seasonal allergies    but doesn't take any meds  . Sleep apnea    uses CPAP  . Tubular adenoma of colon     Patient Active Problem List   Diagnosis Date Noted  . Acquired absence of other right toe(s) ([place]) 03/11/2019  . Stage 3 chronic kidney disease 03/11/2019  . Absence of toe of left foot ([place]) 04/14/2018  . Anemia 04/14/2018  . Diverticulitis 04/14/2018  . Diverticulosis 04/14/2018  . Gastro-esophageal reflux disease without esophagitis 04/14/2018  . Major depression 04/14/2018  . Mild persistent asthma, uncomplicated [id number]  . Obstructive sleep apnea syndrome 04/14/2018  . Polyneuropathy due to type 2 diabetes mellitus ([place]) 04/14/2018  . Proteinuria 04/14/2018  . Restless legs 04/14/2018  . Chronic kidney disease due to hypertension 04/14/2018  . Hyperlipidemia 01/24/2017  . Uncontrolled type 2 diabetes mellitus with peripheral neuropathy ([place]) 05/10/2016  . Onychomycosis 05/12/2014  . Ulcer of right foot ([place]) 05/12/2014  . Osteomyelitis of toe ([place]) 02/09/2012  . Anemia in chronic renal disease 06/05/2011  . Anemia, iron deficiency 06/05/2011  . Psoriasis 06/05/2011  . DDD (degenerative disc disease), cervical 06/05/2011  . DDD (degenerative disc disease), lumbosacral 06/05/2011  . HTN (hypertension) 06/05/2011    Past Surgical History:  Procedure Laterality Date  . ABDOMINAL HYSTERECTOMY  1982 and 1984  . AMPUTATION  02/12/2012   Procedure: AMPUTATION RAY;  Surgeon: Jenny Reichmann  Doran Durand, MD;  Location: [place];  Service: Orthopedics;  Laterality: Right;  RIGHT 2ND TOE AMPUTATION   . BACK SURGERY     x 3  . CARDIAC CATHETERIZATION     08/09/04: 50% mid AV groove CX lesion, NL LM, LAD, Ramus, EF > 60% (Dr. Gwenlyn Found)  . CARPAL TUNNEL RELEASE Bilateral 2010  . CERVICAL SPINE SURGERY    . CHOLECYSTECTOMY  1977  . COLONOSCOPY    . DILATION AND CURETTAGE OF UTERUS    . ELBOW SURGERY     left d/t tendonitis  . ESOPHAGOGASTRODUODENOSCOPY    . LUMBAR WOUND  DEBRIDEMENT N/A 07/15/2012   Procedure: I & D of Lumbar Wound: Possible Repair of CSF Leak;  Surgeon: Elaina Hoops, MD;  Location: [facility];  Service: Neurosurgery;  Laterality: N/A;  I & D of Lumbar Wound: Possible Repair of CSF Leak placement lumbar drain  . right knee arthroscopy     x 2  . SHOULDER ARTHROSCOPY Left   . TOE AMPUTATION Left      OB History   No obstetric history on file.     Family History  Problem Relation Age of Onset  . Colon cancer Brother 23  . Hypertension Mother        died from infection s/p hip surgery  . Heart attack Father   . Colon cancer Maternal Grandfather   . Stomach cancer Neg Hx   . Esophageal cancer Neg Hx   . Rectal cancer Neg Hx   . Liver cancer Neg Hx     Social History   Tobacco Use  . Smoking status: Never Smoker  . Smokeless tobacco: Never Used  . Tobacco comment: never used tobacco  Substance Use Topics  . Alcohol use: No    Alcohol/week: 0.0 standard drinks  . Drug use: No    Home Medications Prior to Admission medications   Medication Sig Start Date End Date Taking? Authorizing Provider  albuterol (PROVENTIL HFA;VENTOLIN HFA) 108 (90 BASE) MCG/ACT inhaler Inhale 1 puff into the lungs as needed for shortness of breath.     [provider]  ammonium lactate (AMLACTIN) 12 % cream Apply topically as needed for dry skin. 10/13/18   Trula Slade, DPM  aspirin 81 MG tablet Take 81 mg by mouth daily.    [provider]  Aspirin-Calcium Carbonate 81-777 MG TABS Take by mouth.    [provider]  clobetasol (OLUX) 0.05 % topical foam Apply 1 application topically daily as needed (apply to head for dry scalp).  02/05/12   [provider]  COLLAGEN-VITAMIN C PO Take by mouth daily.    [provider]  DULoxetine (CYMBALTA) 30 MG capsule Take 90 mg by mouth daily. 03/04/16   [provider]  glipiZIDE (GLUCOTROL) 5 MG tablet TAKE 1 TABLET(5 MG) BY MOUTH TWICE DAILY BEFORE A  MEAL 02/16/19   Philemon Kingdom, MD  glucose blood (ONETOUCH VERIO) test strip TEST TWICE DAILY 05/06/19   Philemon Kingdom, MD  Insulin Degludec (TRESIBA FLEXTOUCH) 200 UNIT/ML SOPN Inject 10 Units into the skin daily. 02/04/19   Philemon Kingdom, MD  Insulin Pen Needle 32G X 4 MM MISC Use 1x a day 02/04/19   Philemon Kingdom, MD  JARDIANCE 25 MG TABS tablet TAKE 1 TABLET BY MOUTH EVERY DAY 11/25/18   Philemon Kingdom, MD  lisinopril-hydrochlorothiazide (PRINZIDE,ZESTORETIC) 20-25 MG per tablet Take 1 tablet by mouth daily.    [provider]  LORazepam (ATIVAN) 1 MG  tablet Take 1 mg by mouth daily as needed.     [provider]  metoprolol succinate (TOPROL-XL) 25 MG 24 hr tablet  02/16/16   [provider]  Multiple Vitamin (MULTI-VITAMIN DAILY PO) Take 1 tablet by mouth daily.     [provider]  Multiple Vitamins-Minerals (MULTIVITAMIN WITH MINERALS) tablet Take by mouth.    [provider]  ondansetron (ZOFRAN) 8 MG tablet TAKE 1 TABLET BY MOUTH 3 TIMES A DAY AS NEEDED NAUSEA (INS PAYS #18/21 DAYS) 12/12/15   [provider]  Probiotic Product (PROBIOTIC DAILY PO) Take by mouth. Probiotic plus daily    [provider]  Semaglutide, 1 MG/DOSE, (OZEMPIC, 1 MG/DOSE,) 2 MG/1.5ML SOPN Inject 1 mg into the skin once a week. 03/09/19   Philemon Kingdom, MD  silver sulfADIAZINE (SILVADENE) 1 % cream Apply 1 application topically daily. 02/06/17   Trula Slade, DPM  simvastatin (ZOCOR) 40 MG tablet Take 40 mg by mouth at bedtime.     [provider]  triamcinolone (KENALOG) 0.025 % ointment Apply 1 application topically 2 (two) times daily. 10/13/18   Trula Slade, DPM    Allergies    Victoza [liraglutide], Hydromorphone, Ultram [tramadol], Betadine [povidone iodine], Bydureon [exenatide], Codeine, Contrast media [iodinated diagnostic agents], Humira [adalimumab], Hydrocodone, Iodine, Januvia [sitagliptin], Morphine  and related, Percocet [oxycodone-acetaminophen], Septra [sulfamethoxazole-trimethoprim], Sulfa antibiotics, Ultram [tramadol hcl], Valium, Vicodin [hydrocodone-acetaminophen], Amoxicillin, Darvocet [propoxyphene n-acetaminophen], Fentanyl and related, and Wellbutrin [bupropion hcl]  Review of Systems   Review of Systems Ten systems are reviewed and are negative for acute change except as noted in the HPI  Physical Exam Updated Vital Signs BP 116/64   Pulse (!) 113   Temp 97.7 F (36.5 C) (Oral)   Resp 17   Ht 5' 3.5" (1.613 m)   Wt 96.6 kg   SpO2 99%   BMI 37.14 kg/m   Physical Exam Constitutional:      General: She is not in acute distress.    Appearance: Normal appearance. She is well-developed. She is not ill-appearing or diaphoretic.  HENT:     Head: Normocephalic and atraumatic.     Right Ear: External ear normal.     Left Ear: External ear normal.     Nose: Nose normal.  Eyes:     General: Vision grossly intact. Gaze aligned appropriately.     Pupils: Pupils are equal, round, and reactive to light.  Neck:     Trachea: Trachea and phonation normal. No tracheal deviation.  Pulmonary:     Effort: Pulmonary effort is normal. No respiratory distress.  Abdominal:     General: There is no distension.     Palpations: Abdomen is soft.     Tenderness: There is generalized abdominal tenderness. There is no guarding or rebound.  Musculoskeletal:        General: Normal range of motion.     Cervical back: Normal range of motion.  Skin:    General: Skin is warm and dry.  Neurological:     Mental Status: She is alert.     GCS: GCS eye subscore is 4. GCS verbal subscore is 5. GCS motor subscore is 6.     Comments: Speech is clear and goal oriented, follows commands Major Cranial nerves without deficit, no facial droop Moves extremities without ataxia, coordination intact  Psychiatric:        Behavior: Behavior normal.     ED Results / Procedures / Treatments   Labs (all  labs ordered are listed, but only abnormal results are displayed) Labs Reviewed  COMPREHENSIVE METABOLIC PANEL - Abnormal; Notable for the following components:      Result Value   Sodium 131 (*)    Potassium 5.3 (*)    CO2 17 (*)    Glucose, Bld 291 (*)    BUN 73 (*)    Creatinine, Ser 2.21 (*)    Total Protein 6.4 (*)    AST 11 (*)    GFR calc non Af Amer 22 (*)    GFR calc Af Amer 26 (*)    All other components within normal limits  CBC - Abnormal; Notable for the following components:   WBC 25.3 (*)    All other components within normal limits  URINALYSIS, ROUTINE W REFLEX MICROSCOPIC - Abnormal; Notable for the following components:   APPearance CLOUDY (*)    Glucose, UA >=500 (*)    Leukocytes,Ua SMALL (*)    All other components within normal limits  URINALYSIS, MICROSCOPIC (REFLEX) - Abnormal; Notable for the following components:   Bacteria, UA FEW (*)    All other components within normal limits  GI PATHOGEN PANEL BY PCR, STOOL  C DIFFICILE QUICK SCREEN W PCR REFLEX  CULTURE, BLOOD (ROUTINE X 2)  CULTURE, BLOOD (ROUTINE X 2)  SARS CORONAVIRUS 2 (TAT 6-24 HRS)  SARS CORONAVIRUS 2 AG (30 MIN TAT)  LIPASE, BLOOD  LACTIC ACID, PLASMA  LACTIC ACID, PLASMA    EKG None  Radiology CT ABDOMEN PELVIS WO CONTRAST  Result Date: 05/08/2019 CLINICAL DATA:  Diarrhea x4 days with a history of diverticulitis. EXAM: CT ABDOMEN AND PELVIS WITHOUT CONTRAST TECHNIQUE: Multidetector CT imaging of the abdomen and pelvis was performed following the standard protocol without IV contrast. COMPARISON:  12/30/2015 FINDINGS: Lower chest: The lung bases are clear. The heart size is normal. Hepatobiliary: The liver is normal. Status post cholecystectomy.There is no biliary ductal dilation. Pancreas: Normal contours without ductal dilatation. No peripancreatic fluid collection. Spleen: No splenic laceration or hematoma. Adrenals/Urinary Tract: --Adrenal glands: No adrenal hemorrhage. --Right  kidney/ureter: No hydronephrosis or perinephric hematoma. --Left kidney/ureter: No hydronephrosis or perinephric hematoma. --Urinary bladder: Unremarkable. Stomach/Bowel: --Stomach/Duodenum: No hiatal hernia or other gastric abnormality. Normal duodenal course and caliber. --Small bowel: There is stents of diverticula involving loops of small bowel in the left mid abdomen, likely jejunal loops. This is similar in appearance to prior CT. There is no adjacent drainable fluid collection or free air. --Colon: There is sigmoid diverticulosis without CT evidence for diverticulitis. Liquid stool is noted in the colon. --Appendix: Normal. Vascular/Lymphatic: Atherosclerotic calcification is present within the non-aneurysmal abdominal aorta, without hemodynamically significant stenosis. --No retroperitoneal lymphadenopathy. --No mesenteric lymphadenopathy. --No pelvic or inguinal lymphadenopathy. Reproductive: Status post hysterectomy. No adnexal mass. Other: No ascites or free air. The abdominal wall is normal. Musculoskeletal. The patient is status post prior posterior fusion from L2-L1. Multilevel interbody spaces are noted. The patient is status post multilevel posterior decompression. There is a grade 1 anterolisthesis of L5 on S1. IMPRESSION: 1. As seen on prior study from 2017, again noted is uncomplicated small-bowel diverticulitis in the left mid abdomen. There is no drainable fluid collection or free air. There is no evidence for small bowel obstruction. 2. Scattered colonic diverticula without CT evidence for diverticulitis. 3. Liquid stool throughout the colon consistent with diarrhea. Aortic Atherosclerosis (ICD10-I70.0). Electronically Signed   By: Constance Holster M.D.   On: 05/08/2019 17:03    Procedures Procedures (including critical care  time)  Medications Ordered in ED Medications  sodium chloride flush (NS) 0.9 % injection 3 mL (3 mLs Intravenous Not Given 05/08/19 1659)  sodium chloride 0.9 %  bolus 1,000 mL (has no administration in time range)  sodium chloride 0.9 % bolus 500 mL ( Intravenous Stopped 05/08/19 1825)  cefTRIAXone (ROCEPHIN) 2 g in sodium chloride 0.9 % 100 mL IVPB ( Intravenous Stopped 05/08/19 1807)    And  metroNIDAZOLE (FLAGYL) IVPB 500 mg ( Intravenous Stopped 05/08/19 1823)    ED Course  I have reviewed the triage vital signs and the nursing notes.  Pertinent labs & imaging results that were available during my care of the patient were reviewed by me and considered in my medical decision making (see chart for details).  Clinical Course as of May 07 1937  Fri May 08, 2019  1915 Dr. Roosevelt Locks   [BM]    Clinical Course User Index [BM] Gari Crown   MDM Rules/Calculators/A&P                     Triage lab work:  CBC with leukocytosis of 25.3 Lipase 28 CMP with creatinine 2.21 up from 1.4 previously, potassium 5.3, sodium 131, BUN 73 - On initial evaluation patient is tired appearing no acute distress, nontoxic.  Abdomen with generalized tenderness no rebound or peritoneal signs, neurovascular intact to all 4 extremities, no meningeal signs.  Concern for recurrence of diverticulitis will obtain CT scan in order fluid bolus, also with patient's multiple episodes of mucousy diarrhea concern for possible C. difficile, will obtain stool panel. - CT abdomen pelvis:  IMPRESSION:  1. As seen on prior study from 2017, again noted is uncomplicated  small-bowel diverticulitis in the left mid abdomen. There is no  drainable fluid collection or free air. There is no evidence for  small bowel obstruction.  2. Scattered colonic diverticula without CT evidence for  diverticulitis.  3. Liquid stool throughout the colon consistent with diarrhea.    Aortic Atherosclerosis (ICD10-I70.0).   Urinalysis shows glucose, small leukocytes, few bacteria, 11-20 WBCs appears contaminated, patient without urinary symptoms doubt UTI.  It appears patient has recurrence of  diverticulitis, with new leukocytosis, mild AKI and tachycardia feel patient will need admission for IV fluids and antibiotics.  Flagyl, Rocephin ordered.  Initial fluid bolus running.  Will consult hospitalist for admission.  Discussed case with Dr. Billy Fischer who agrees with plan of care.  On reevaluation patient resting comfortably no acute distress, does not appear toxic.  Blood cultures and lactic added. - Awaiting hospitalist consult, patient reevaluated resting comfortably no acute distress is agreeable for admission.  Lactic 1.2 - Discussed case with hospitalist Dr. Roosevelt Locks who has accepted patient to his service.   Note: Portions of this report may have been transcribed using voice recognition software. Every effort was made to ensure accuracy; however, inadvertent computerized transcription errors may still be present. Final Clinical Impression(s) / ED Diagnoses Final diagnoses:  Diverticulitis  AKI (acute kidney injury) [facility])    Rx / DC Orders ED Discharge Orders    None       Gari Crown 05/08/19 1940    Gareth Morgan, MD 05/10/19 2155

## 2019-05-08 NOTE — ED Notes (Signed)
[facility] arrived to transport [facility] to [facility] 

## 2019-05-08 NOTE — Progress Notes (Signed)
Pt presented with diarrhea and abd pain, WBC 22, CT abd showed small bowel diverticulitis, [facility] started Rocephin and Flagyl, BP, Lactate normal, no fever.

## 2019-05-08 NOTE — ED Notes (Signed)
ED Provider at bedside. 

## 2019-05-09 ENCOUNTER — Encounter (HOSPITAL_COMMUNITY): Payer: Self-pay | Admitting: Internal Medicine

## 2019-05-09 DIAGNOSIS — N179 Acute kidney failure, unspecified: Secondary | ICD-10-CM

## 2019-05-09 DIAGNOSIS — K5792 Diverticulitis of intestine, part unspecified, without perforation or abscess without bleeding: Secondary | ICD-10-CM

## 2019-05-09 DIAGNOSIS — I10 Essential (primary) hypertension: Secondary | ICD-10-CM | POA: Diagnosis present

## 2019-05-09 LAB — HEPATIC FUNCTION PANEL
ALT: 10 U/L (ref 0–44)
AST: 8 U/L — ABNORMAL LOW (ref 15–41)
Albumin: 2.8 g/dL — ABNORMAL LOW (ref 3.5–5.0)
Alkaline Phosphatase: 59 U/L (ref 38–126)
Bilirubin, Direct: <0.1 mg/dL (ref 0.0–0.2)
Total Bilirubin: 0.4 mg/dL (ref 0.3–1.2)
Total Protein: 5 g/dL — ABNORMAL LOW (ref 6.5–8.1)

## 2019-05-09 LAB — CBC WITH DIFFERENTIAL/PLATELET
Abs Immature Granulocytes: 0.35 10*3/uL — ABNORMAL HIGH (ref 0.00–0.07)
Basophils Absolute: 0.1 10*3/uL (ref 0.0–0.1)
Basophils Relative: 0 %
Eosinophils Absolute: 4.1 10*3/uL — ABNORMAL HIGH (ref 0.0–0.5)
Eosinophils Relative: 23 %
HCT: 34.3 % — ABNORMAL LOW (ref 36.0–46.0)
Hemoglobin: 11.1 g/dL — ABNORMAL LOW (ref 12.0–15.0)
Immature Granulocytes: 2 %
Lymphocytes Relative: 16 %
Lymphs Abs: 2.9 10*3/uL (ref 0.7–4.0)
MCH: 30.6 pg (ref 26.0–34.0)
MCHC: 32.4 g/dL (ref 30.0–36.0)
MCV: 94.5 fL (ref 80.0–100.0)
Monocytes Absolute: 1.1 10*3/uL — ABNORMAL HIGH (ref 0.1–1.0)
Monocytes Relative: 6 %
Neutro Abs: 9.4 10*3/uL — ABNORMAL HIGH (ref 1.7–7.7)
Neutrophils Relative %: 53 %
Platelets: 279 10*3/uL (ref 150–400)
RBC: 3.63 MIL/uL — ABNORMAL LOW (ref 3.87–5.11)
RDW: 12.9 % (ref 11.5–15.5)
Smear Review: ADEQUATE
WBC: 18 10*3/uL — ABNORMAL HIGH (ref 4.0–10.5)
nRBC: 0 % (ref 0.0–0.2)

## 2019-05-09 LAB — SARS CORONAVIRUS 2 (TAT 6-24 HRS): SARS Coronavirus 2: NEGATIVE

## 2019-05-09 LAB — BASIC METABOLIC PANEL
Anion gap: 11 (ref 5–15)
BUN: 72 mg/dL — ABNORMAL HIGH (ref 8–23)
CO2: 18 mmol/L — ABNORMAL LOW (ref 22–32)
Calcium: 8.1 mg/dL — ABNORMAL LOW (ref 8.9–10.3)
Chloride: 109 mmol/L (ref 98–111)
Creatinine, Ser: 2.01 mg/dL — ABNORMAL HIGH (ref 0.44–1.00)
GFR calc Af Amer: 29 mL/min — ABNORMAL LOW (ref >60)
GFR calc non Af Amer: 25 mL/min — ABNORMAL LOW (ref >60)
Glucose, Bld: 195 mg/dL — ABNORMAL HIGH (ref 70–99)
Potassium: 4.9 mmol/L (ref 3.5–5.1)
Sodium: 138 mmol/L (ref 135–145)

## 2019-05-09 LAB — C DIFFICILE QUICK SCREEN W PCR REFLEX
C Diff antigen: NEGATIVE
C Diff interpretation: NOT DETECTED
C Diff toxin: NEGATIVE

## 2019-05-09 LAB — GLUCOSE, CAPILLARY
Glucose-Capillary: 186 mg/dL — ABNORMAL HIGH (ref 70–99)
Glucose-Capillary: 217 mg/dL — ABNORMAL HIGH (ref 70–99)
Glucose-Capillary: 166 mg/dL — ABNORMAL HIGH (ref 70–99)
Glucose-Capillary: 174 mg/dL — ABNORMAL HIGH (ref 70–99)
Glucose-Capillary: 178 mg/dL — ABNORMAL HIGH (ref 70–99)

## 2019-05-09 LAB — HIV ANTIBODY (ROUTINE TESTING W REFLEX): HIV Screen 4th Generation wRfx: NONREACTIVE

## 2019-05-09 LAB — MAGNESIUM: Magnesium: 1 mg/dL — ABNORMAL LOW (ref 1.7–2.4)

## 2019-05-09 LAB — MRSA PCR SCREENING: MRSA by PCR: NEGATIVE

## 2019-05-09 MED ORDER — ONDANSETRON HCL 4 MG/2ML IJ SOLN: 4.0000 mg | Freq: Four times a day (QID) | INTRAMUSCULAR | Status: DC | PRN

## 2019-05-09 MED ORDER — METOPROLOL SUCCINATE ER 25 MG PO TB24
25.0000 mg | ORAL_TABLET | Freq: Every day | ORAL | Status: DC
  Administered 2019-05-09 – 2019-05-11 (×3): 25 mg via ORAL
  Filled 2019-05-09 (×3): qty 1

## 2019-05-09 MED ORDER — ONDANSETRON HCL 4 MG PO TABS: 4.0000 mg | ORAL_TABLET | Freq: Four times a day (QID) | ORAL | Status: DC | PRN

## 2019-05-09 MED ORDER — LABETALOL HCL 5 MG/ML IV SOLN: 10.0000 mg | INTRAVENOUS | Status: DC | PRN

## 2019-05-09 MED ORDER — DULOXETINE HCL 60 MG PO CPEP
90.0000 mg | ORAL_CAPSULE | Freq: Every day | ORAL | Status: DC
  Administered 2019-05-09: 19:00:00 90 mg via ORAL
  Administered 2019-05-10 – 2019-05-11 (×2): 60 mg via ORAL
  Filled 2019-05-09 (×3): qty 1

## 2019-05-09 MED ORDER — SODIUM CHLORIDE 0.9 % IV SOLN
2.0000 g | INTRAVENOUS | Status: DC
  Administered 2019-05-09 – 2019-05-10 (×2): 2 g via INTRAVENOUS
  Filled 2019-05-09 (×2): qty 20

## 2019-05-09 MED ORDER — BACITRACIN-NEOMYCIN-POLYMYXIN OINTMENT TUBE
TOPICAL_OINTMENT | CUTANEOUS | Status: DC | PRN
  Filled 2019-05-09: qty 14

## 2019-05-09 MED ORDER — METRONIDAZOLE IN NACL 5-0.79 MG/ML-% IV SOLN
500.0000 mg | Freq: Three times a day (TID) | INTRAVENOUS | Status: DC
  Administered 2019-05-09 – 2019-05-11 (×7): 500 mg via INTRAVENOUS
  Filled 2019-05-09 (×7): qty 100

## 2019-05-09 MED ORDER — LORAZEPAM 1 MG PO TABS
1.0000 mg | ORAL_TABLET | Freq: Every day | ORAL | Status: DC | PRN
  Administered 2019-05-09 – 2019-05-10 (×2): 1 mg via ORAL
  Filled 2019-05-09 (×2): qty 1

## 2019-05-09 MED ORDER — SODIUM CHLORIDE 0.9 % IV SOLN: INTRAVENOUS | Status: AC

## 2019-05-09 MED ORDER — INSULIN ASPART 100 UNIT/ML ~~LOC~~ SOLN
0.0000 [IU] | SUBCUTANEOUS | Status: DC
  Administered 2019-05-09: 18:00:00 2 [IU] via SUBCUTANEOUS
  Administered 2019-05-09: 10:00:00 3 [IU] via SUBCUTANEOUS
  Administered 2019-05-09 – 2019-05-10 (×5): 2 [IU] via SUBCUTANEOUS
  Administered 2019-05-11: 12:00:00 1 [IU] via SUBCUTANEOUS
  Administered 2019-05-11: 09:00:00 5 [IU] via SUBCUTANEOUS

## 2019-05-09 MED ORDER — SIMVASTATIN 20 MG PO TABS
40.0000 mg | ORAL_TABLET | Freq: Every day | ORAL | Status: DC
  Administered 2019-05-09 – 2019-05-10 (×2): 40 mg via ORAL
  Filled 2019-05-09 (×2): qty 2

## 2019-05-09 MED ORDER — ACETAMINOPHEN 650 MG RE SUPP: 650.0000 mg | Freq: Four times a day (QID) | RECTAL | Status: DC | PRN

## 2019-05-09 MED ORDER — ACETAMINOPHEN 325 MG PO TABS: 650.0000 mg | ORAL_TABLET | Freq: Four times a day (QID) | ORAL | Status: DC | PRN

## 2019-05-09 NOTE — H&P (Signed)
History and Physical    [name] DOB: 1951/05/07 DOA: 05/08/2019  PCP: Mayra Neer, MD  Patient coming from: Home.  Chief Complaint: Abdominal pain diarrhea.  HPI: [name] is a 69 y.o. female with history of diabetes mellitus type 2, hypertension, chronic kidney disease stage III, asthma presents to the ER at [facility] with complaints of having abdominal pain for the last 2 days with diarrhea for last 4 days.  Patient states she had taken antibiotics 2 weeks ago for urinary tract infection.  Patient has been having frequent episodes of watery diarrhea for last 4 days and started developing epigastric abdominal pain which is crampy in nature episodic for the last 2 days which has become worse.  Denies any fever chills but denies any recent travel denies any shortness of breath or chest pain.  ED Course: In the ER patient was tachycardic afebrile with labs showing potassium of 5.3 creatinine of 2.2 which is increased from baseline of 1.4 WBC 25.3 lactic acid 1.2 Covid test was negative.  CT abdomen pelvis done shows features concerning for small bowel diverticulitis with diarrheal state.  Stool studies were ordered.  Patient started on ceftriaxone and Flagyl ordered patient has multiple medication allergies.  On my exam abdomen is mildly tender but has no rigidity or rebound tenderness.  Review of Systems: As per HPI, rest all negative.   Past Medical History:  Diagnosis Date  . Anemia in chronic renal disease 06/05/2011  . Anxiety    takes Ativan tid  . Asthma    has an inhaler prn  . Cough   . DDD (degenerative disc disease), cervical 06/05/2011  . DDD (degenerative disc disease), lumbosacral 06/05/2011  . Depression    takes Cymbalta daily  . Diabetes mellitus    takes Amaryl and Metformin daily  . Diabetic neuropathy ([place])   . Diverticulosis   . Dizziness   . GERD (gastroesophageal reflux disease)    takes Protonix prn  .  Headache(784.0)    occasionally  . History of bronchitis    last time 27yr ago  . History of colon polyps   . History of MRSA infection   . HTN (hypertension) 06/05/2011   takes Prinizide daily  . Hyperlipidemia    takes Zocor daily  . Insomnia    not on any meds at present time  . Osteoarthritis   . PONV (postoperative nausea and vomiting)    hard to wake up  . Psoriasis 06/05/2011   on legs and uses cream prn  . Seasonal allergies    but doesn't take any meds  . Sleep apnea    uses CPAP  . Tubular adenoma of colon     Past Surgical History:  Procedure Laterality Date  . ABDOMINAL HYSTERECTOMY  1982 and 1984  . AMPUTATION  02/12/2012   Procedure: AMPUTATION RAY;  Surgeon: JWylene Simmer MD;  Location: [facility]  Service: Orthopedics;  Laterality: Right;  RIGHT 2ND TOE AMPUTATION   . BACK SURGERY     x 3  . CARDIAC CATHETERIZATION     08/09/04: 50% mid AV groove CX lesion, NL LM, LAD, Ramus, EF > 60% (Dr. BGwenlyn Found  . CARPAL TUNNEL RELEASE Bilateral 2010  . CERVICAL SPINE SURGERY    . CHOLECYSTECTOMY  1977  . COLONOSCOPY    . DILATION AND CURETTAGE OF UTERUS    . ELBOW SURGERY     left d/t tendonitis  . ESOPHAGOGASTRODUODENOSCOPY    .  LUMBAR WOUND DEBRIDEMENT N/A 07/15/2012   Procedure: I & D of Lumbar Wound: Possible Repair of CSF Leak;  Surgeon: Elaina Hoops, MD;  Location: [facility];  Service: Neurosurgery;  Laterality: N/A;  I & D of Lumbar Wound: Possible Repair of CSF Leak placement lumbar drain  . right knee arthroscopy     x 2  . SHOULDER ARTHROSCOPY Left   . TOE AMPUTATION Left      reports that she has never smoked. She has never used smokeless tobacco. She reports that she does not drink alcohol or use drugs.  Allergies  Allergen Reactions  . Victoza [Liraglutide]     Severe rash and hives.  . Hydromorphone Diarrhea, Hives, Rash and Swelling    Rash and swelling  . Ultram [Tramadol] Rash  . Betadine [Povidone Iodine] Other (See Comments)    Skin peels  .  Bydureon [Exenatide] Diarrhea and Nausea And Vomiting  . Codeine     Severe headaches  . Contrast Media [Iodinated Diagnostic Agents] Nausea And Vomiting  . Humira [Adalimumab] Hives  . Hydrocodone Other (See Comments)    headache  . Iodine Hives    Iodine hives  . Januvia [Sitagliptin] Diarrhea  . Morphine And Related Nausea And Vomiting  . Percocet [Oxycodone-Acetaminophen] Other (See Comments)    Headache   . Septra [Sulfamethoxazole-Trimethoprim]     rash  . Sulfa Antibiotics Itching  . Ultram [Tramadol Hcl]   . Valium Other (See Comments)    headache  . Vicodin [Hydrocodone-Acetaminophen] Other (See Comments)    headache  . Amoxicillin Hives and Rash  . Darvocet [Propoxyphene N-Acetaminophen] Rash and Other (See Comments)    headache  . Fentanyl And Related Rash  . Wellbutrin [Bupropion Hcl] Rash    Family History  Problem Relation Age of Onset  . Colon cancer Brother 51  . Hypertension Mother        died from infection s/p hip surgery  . Heart attack Father   . Colon cancer Maternal Grandfather   . Stomach cancer Neg Hx   . Esophageal cancer Neg Hx   . Rectal cancer Neg Hx   . Liver cancer Neg Hx     Prior to Admission medications   Medication Sig Start Date End Date Taking? Authorizing Provider  albuterol (PROVENTIL HFA;VENTOLIN HFA) 108 (90 BASE) MCG/ACT inhaler Inhale 1 puff into the lungs as needed for shortness of breath.     [provider]  ammonium lactate (AMLACTIN) 12 % cream Apply topically as needed for dry skin. 10/13/18   Trula Slade, DPM  aspirin 81 MG tablet Take 81 mg by mouth daily.    [provider]  Aspirin-Calcium Carbonate 81-777 MG TABS Take by mouth.    [provider]  clobetasol (OLUX) 0.05 % topical foam Apply 1 application topically daily as needed (apply to head for dry scalp).  02/05/12   [provider]  COLLAGEN-VITAMIN C PO Take by mouth daily.    [provider]  DULoxetine  (CYMBALTA) 30 MG capsule Take 90 mg by mouth daily. 03/04/16   [provider]  glipiZIDE (GLUCOTROL) 5 MG tablet TAKE 1 TABLET(5 MG) BY MOUTH TWICE DAILY BEFORE A MEAL 02/16/19   Philemon Kingdom, MD  glucose blood (ONETOUCH VERIO) test strip TEST TWICE DAILY 05/06/19   Philemon Kingdom, MD  Insulin Degludec (TRESIBA FLEXTOUCH) 200 UNIT/ML SOPN Inject 10 Units into the skin daily. 02/04/19   Philemon Kingdom, MD  Insulin Pen Needle  32G X 4 MM MISC Use 1x a day 02/04/19   Philemon Kingdom, MD  JARDIANCE 25 MG TABS tablet TAKE 1 TABLET BY MOUTH EVERY DAY 11/25/18   Philemon Kingdom, MD  lisinopril-hydrochlorothiazide (PRINZIDE,ZESTORETIC) 20-25 MG per tablet Take 1 tablet by mouth daily.    [provider]  LORazepam (ATIVAN) 1 MG tablet Take 1 mg by mouth daily as needed.     [provider]  metoprolol succinate (TOPROL-XL) 25 MG 24 hr tablet  02/16/16   [provider]  Multiple Vitamin (MULTI-VITAMIN DAILY PO) Take 1 tablet by mouth daily.     [provider]  Multiple Vitamins-Minerals (MULTIVITAMIN WITH MINERALS) tablet Take by mouth.    [provider]  ondansetron (ZOFRAN) 8 MG tablet TAKE 1 TABLET BY MOUTH 3 TIMES A DAY AS NEEDED NAUSEA (INS PAYS #18/21 DAYS) 12/12/15   [provider]  Probiotic Product (PROBIOTIC DAILY PO) Take by mouth. Probiotic plus daily    [provider]  Semaglutide, 1 MG/DOSE, (OZEMPIC, 1 MG/DOSE,) 2 MG/1.5ML SOPN Inject 1 mg into the skin once a week. 03/09/19   Philemon Kingdom, MD  silver sulfADIAZINE (SILVADENE) 1 % cream Apply 1 application topically daily. 02/06/17   Trula Slade, DPM  simvastatin (ZOCOR) 40 MG tablet Take 40 mg by mouth at bedtime.     [provider]  triamcinolone (KENALOG) 0.025 % ointment Apply 1 application topically 2 (two) times daily. 10/13/18   Trula Slade, DPM    Physical Exam: Constitutional: Moderately built and nourished. Vitals:    05/08/19 2100 05/08/19 2130 05/08/19 2246 05/08/19 2258  BP: (!) 159/76 118/65 (!) 143/65   Pulse: (!) 103 (!) 103 (!) 110 100  Resp: 14 13    Temp:   98.2 F (36.8 C)   TempSrc:   Oral   SpO2: 98% 99% 100%   Weight:   95.7 kg   Height:   5' 3.5" (1.613 m)    Eyes: Anicteric no pallor. ENMT: No discharge from the ears eyes nose or mouth. Neck: No mass felt.  No neck rigidity. Respiratory: No rhonchi or crepitations. Cardiovascular: S1-S2 heard. Abdomen: Soft mild epigastric tenderness no guarding or rigidity. Musculoskeletal: No edema. Skin: No rash. Neurologic: Alert awake oriented to time place and person.  Moves all extremities. Psychiatric: Appears normal per normal affect.   Labs on Admission: I have personally reviewed following labs and imaging studies  CBC: Recent Labs  Lab 05/08/19 1517  WBC 25.3*  HGB 14.0  HCT 42.7  MCV 95.3  PLT 448   Basic Metabolic Panel: Recent Labs  Lab 05/08/19 1517  NA 131*  K 5.3*  CL 102  CO2 17*  GLUCOSE 291*  BUN 73*  CREATININE 2.21*  CALCIUM 8.9   GFR: Estimated Creatinine Clearance: 27.8 mL/min (A) (by C-G formula based on SCr of 2.21 mg/dL (H)). Liver Function Tests: Recent Labs  Lab 05/08/19 1517  AST 11*  ALT 12  ALKPHOS 80  BILITOT 0.4  PROT 6.4*  ALBUMIN 3.6   Recent Labs  Lab 05/08/19 1517  LIPASE 28   No results for input(s): AMMONIA in the last 168 hours. Coagulation Profile: No results for input(s): INR, PROTIME in the last 168 hours. Cardiac Enzymes: No results for input(s): CKTOTAL, CKMB, CKMBINDEX, TROPONINI in the last 168 hours. BNP (last 3 results) No results for input(s): PROBNP in the last 8760 hours. HbA1C: No results for input(s): HGBA1C in the last 72 hours. CBG: Recent  Labs  Lab 05/08/19 2143 05/08/19 2256  GLUCAP 175* 189*   Lipid Profile: No results for input(s): CHOL, HDL, LDLCALC, TRIG, CHOLHDL, LDLDIRECT in the last 72 hours. Thyroid Function Tests: No results for  input(s): TSH, T4TOTAL, FREET4, T3FREE, THYROIDAB in the last 72 hours. Anemia Panel: No results for input(s): VITAMINB12, FOLATE, FERRITIN, TIBC, IRON, RETICCTPCT in the last 72 hours. Urine analysis:    Component Value Date/Time   COLORURINE YELLOW 05/08/2019 1620   APPEARANCEUR CLOUDY (A) 05/08/2019 1620   LABSPEC 1.025 05/08/2019 1620   PHURINE 5.5 05/08/2019 1620   GLUCOSEU >=500 (A) 05/08/2019 1620   HGBUR NEGATIVE 05/08/2019 1620   BILIRUBINUR NEGATIVE 05/08/2019 1620   KETONESUR NEGATIVE 05/08/2019 1620   PROTEINUR NEGATIVE 05/08/2019 1620   NITRITE NEGATIVE 05/08/2019 1620   LEUKOCYTESUR SMALL (A) 05/08/2019 1620   Sepsis Labs: _0 (procalcitonin:4,lacticidven:4) ) Recent Results (from the past 240 hour(s))  C Difficile Quick Screen w PCR reflex     Status: None   Collection Time: 05/08/19  5:23 PM   Specimen: Stool  Result Value Ref Range Status   C Diff antigen NEGATIVE NEGATIVE Final   C Diff toxin NEGATIVE NEGATIVE Final   C Diff interpretation No C. difficile detected.  Final    Comment: Performed at [facility], [place] [address] [place], [place] [address]  SARS CORONAVIRUS 2 (TAT 6-24 HRS) Nasopharyngeal Nasopharyngeal Swab     Status: None   Collection Time: 05/08/19  6:20 PM   Specimen: Nasopharyngeal Swab  Result Value Ref Range Status   SARS Coronavirus 2 NEGATIVE NEGATIVE Final    Comment: (NOTE) SARS-CoV-2 target nucleic acids are NOT DETECTED. The SARS-CoV-2 RNA is generally detectable in upper and lower respiratory specimens during the acute phase of infection. Negative results do not preclude SARS-CoV-2 infection, do not rule out co-infections with other pathogens, and should not be used as the sole basis for treatment or other patient management decisions. Negative results must be combined with clinical observations, patient history, and epidemiological information. The expected result is Negative. Fact Sheet for  Patients: SugarRoll.be Fact Sheet for Healthcare Providers: https://www.woods-mathews.com/ This test is not yet approved or cleared by the [place] FDA and  has been authorized for detection and/or diagnosis of SARS-CoV-2 by FDA under an Emergency Use Authorization (EUA). This EUA will remain  in effect (meaning this test can be used) for the duration of the COVID-19 declaration under Section 56 4(b)(1) of the Act, 21 U.S.C. section 360bbb-3(b)(1), unless the authorization is terminated or revoked sooner. Performed at [facility], [place] [address] [place], [place] [address]   SARS Coronavirus 2 Ag (30 min TAT) - Nasal Swab (BD Veritor Kit)     Status: None   Collection Time: 05/08/19  7:46 PM   Specimen: Nasal Swab (BD Veritor Kit)  Result Value Ref Range Status   SARS Coronavirus 2 Ag NEGATIVE NEGATIVE Final    Comment: (NOTE) SARS-CoV-2 antigen NOT DETECTED.  Negative results are presumptive.  Negative results do not preclude SARS-CoV-2 infection and should not be used as the sole basis for treatment or other patient management decisions, including infection  control decisions, particularly in the presence of clinical signs and  symptoms consistent with COVID-19, or in those who have been in contact with the virus.  Negative results must be combined with clinical observations, patient history, and epidemiological information. The expected result is Negative. Fact Sheet for Patients: PodPark.tn Fact Sheet for Healthcare Providers: GiftContent.is This test  is not yet approved or cleared by the [place] and  has been authorized for detection and/or diagnosis of SARS-CoV-2 by FDA under an Emergency Use Authorization (EUA).  This EUA will remain in effect (meaning this test can be used) for the duration of  the COVID-19 de claration under Section 564(b)(1) of the Act,  21 U.S.C. section 360bbb-3(b)(1), unless the authorization is terminated or revoked sooner. Performed at [facility], [address] [place], [place] [address]      Radiological Exams on Admission: CT ABDOMEN PELVIS WO CONTRAST  Result Date: 05/08/2019 CLINICAL DATA:  Diarrhea x4 days with a history of diverticulitis. EXAM: CT ABDOMEN AND PELVIS WITHOUT CONTRAST TECHNIQUE: Multidetector CT imaging of the abdomen and pelvis was performed following the standard protocol without IV contrast. COMPARISON:  12/30/2015 FINDINGS: Lower chest: The lung bases are clear. The heart size is normal. Hepatobiliary: The liver is normal. Status post cholecystectomy.There is no biliary ductal dilation. Pancreas: Normal contours without ductal dilatation. No peripancreatic fluid collection. Spleen: No splenic laceration or hematoma. Adrenals/Urinary Tract: --Adrenal glands: No adrenal hemorrhage. --Right kidney/ureter: No hydronephrosis or perinephric hematoma. --Left kidney/ureter: No hydronephrosis or perinephric hematoma. --Urinary bladder: Unremarkable. Stomach/Bowel: --Stomach/Duodenum: No hiatal hernia or other gastric abnormality. Normal duodenal course and caliber. --Small bowel: There is stents of diverticula involving loops of small bowel in the left mid abdomen, likely jejunal loops. This is similar in appearance to prior CT. There is no adjacent drainable fluid collection or free air. --Colon: There is sigmoid diverticulosis without CT evidence for diverticulitis. Liquid stool is noted in the colon. --Appendix: Normal. Vascular/Lymphatic: Atherosclerotic calcification is present within the non-aneurysmal abdominal aorta, without hemodynamically significant stenosis. --No retroperitoneal lymphadenopathy. --No mesenteric lymphadenopathy. --No pelvic or inguinal lymphadenopathy. Reproductive: Status post hysterectomy. No adnexal mass. Other: No ascites or free air. The abdominal wall is normal.  Musculoskeletal. The patient is status post prior posterior fusion from L2-L1. Multilevel interbody spaces are noted. The patient is status post multilevel posterior decompression. There is a grade 1 anterolisthesis of L5 on S1. IMPRESSION: 1. As seen on prior study from 2017, again noted is uncomplicated small-bowel diverticulitis in the left mid abdomen. There is no drainable fluid collection or free air. There is no evidence for small bowel obstruction. 2. Scattered colonic diverticula without CT evidence for diverticulitis. 3. Liquid stool throughout the colon consistent with diarrhea. Aortic Atherosclerosis (ICD10-I70.0). Electronically Signed   By: Constance Holster M.D.   On: 05/08/2019 17:03     Assessment/Plan Principal Problem:   Diverticulitis Active Problems:   HTN (hypertension)   Uncontrolled type 2 diabetes mellitus with peripheral neuropathy (HCC)   Essential hypertension   AKI (acute kidney injury) ([place])    1. Small bowel diverticulitis with diarrhea and recent use of antibiotics -we will continue patient on ceftriaxone and Flagyl.  Note that patient has multiple medication allergies.  Keep on clear liquid diet may consult patient's gastroenterologist Dr. Hilarie Fredrickson of [facility] in the morning. 2. Acute on chronic kidney disease stage III likely from diarrhea for which I have placed patient on gentle hydration.  Follow metabolic panel.  Hold lisinopril hydrochlorothiazide due to worsening renal function. 3. Diabetes mellitus type 2 we will keep patient on sliding scale coverage for now. 4. Hypertension we will keep patient on as needed IV labetalol for now.  Holding lisinopril hydrochlorothiazide due to acute renal failure.  We will continue Toprol-XL.  Given the [facility] picture with ongoing abdominal pain and  diarrhea acute renal failure will need close monitoring for any further deterioration in inpatient status.   DVT prophylaxis: SCDs in anticipation of  procedure. Code Status: Full code. Family Communication: Discussed with patient. Disposition Plan: Home. Consults called: None. Admission status: Inpatient.   Rise Patience MD [facility] Pager [phone].  If 7PM-7AM, please contact night-coverage [email or web address] Password [facility]  05/09/2019, 2:43 AM

## 2019-05-09 NOTE — Progress Notes (Signed)
Placed pt. On cpap per order. No issues, pt. Is comfortable.

## 2019-05-09 NOTE — Progress Notes (Signed)
Notified Dr Humphrey Rolls that pt has open incision to the first joints on the 3rd and 4th fingers on the left hand. Pt used Neosporin. . Will continue to monitor the patient's status

## 2019-05-09 NOTE — Plan of Care (Signed)
  Problem: Education: Goal: Knowledge of General Education information will improve Description: Including pain rating scale, medication(s)/side effects and non-pharmacologic comfort measures Outcome: Progressing   Problem: Safety: Goal: Ability to remain free from injury will improve Outcome: Progressing   

## 2019-05-09 NOTE — Progress Notes (Signed)
69 year old lady with prior history of type 2 diabetes, stage III CKD, hypertension, asthma presents to ED with nausea vomiting abdominal pain and diarrhea. CT of the abdomen and pelvis shows small bowel diverticulitis.  She was admitted for further evaluation and management.   On exam patient is alert and appears comfortable Lungs clear to auscultation bilaterally Cardiovascular system S1-S2 heard, regular rate rhythm Abdomen soft mild tenderness generalized, bowel sounds heard     Plan  Continue with IV antibiotics and IV fluids and continue with clear liquid diet for the next 24 hours. Gentle hydration and hold lisinopril and hydrochlorothiazide due to mild AKI.    She was admitted by Dr. Hal Hope earlier this morning please see his note for detailed H&P.  Hosie Poisson MD

## 2019-05-09 NOTE — Plan of Care (Signed)

## 2019-05-10 DIAGNOSIS — E1142 Type 2 diabetes mellitus with diabetic polyneuropathy: Secondary | ICD-10-CM

## 2019-05-10 DIAGNOSIS — E1165 Type 2 diabetes mellitus with hyperglycemia: Secondary | ICD-10-CM

## 2019-05-10 LAB — CBC WITH DIFFERENTIAL/PLATELET
Abs Immature Granulocytes: 0.51 10*3/uL — ABNORMAL HIGH (ref 0.00–0.07)
Basophils Absolute: 0.1 10*3/uL (ref 0.0–0.1)
Basophils Relative: 1 %
Eosinophils Absolute: 5.4 10*3/uL — ABNORMAL HIGH (ref 0.0–0.5)
Eosinophils Relative: 30 %
HCT: 34.9 % — ABNORMAL LOW (ref 36.0–46.0)
Hemoglobin: 11.4 g/dL — ABNORMAL LOW (ref 12.0–15.0)
Immature Granulocytes: 3 %
Lymphocytes Relative: 15 %
Lymphs Abs: 2.6 10*3/uL (ref 0.7–4.0)
MCH: 31.2 pg (ref 26.0–34.0)
MCHC: 32.7 g/dL (ref 30.0–36.0)
MCV: 95.6 fL (ref 80.0–100.0)
Monocytes Absolute: 0.9 10*3/uL (ref 0.1–1.0)
Monocytes Relative: 5 %
Neutro Abs: 8.3 10*3/uL — ABNORMAL HIGH (ref 1.7–7.7)
Neutrophils Relative %: 46 %
Platelets: 307 10*3/uL (ref 150–400)
RBC: 3.65 MIL/uL — ABNORMAL LOW (ref 3.87–5.11)
RDW: 13 % (ref 11.5–15.5)
WBC: 17.8 10*3/uL — ABNORMAL HIGH (ref 4.0–10.5)
nRBC: 0 % (ref 0.0–0.2)

## 2019-05-10 LAB — GLUCOSE, CAPILLARY
Glucose-Capillary: 109 mg/dL — ABNORMAL HIGH (ref 70–99)
Glucose-Capillary: 117 mg/dL — ABNORMAL HIGH (ref 70–99)
Glucose-Capillary: 140 mg/dL — ABNORMAL HIGH (ref 70–99)
Glucose-Capillary: 160 mg/dL — ABNORMAL HIGH (ref 70–99)
Glucose-Capillary: 161 mg/dL — ABNORMAL HIGH (ref 70–99)
Glucose-Capillary: 186 mg/dL — ABNORMAL HIGH (ref 70–99)

## 2019-05-10 NOTE — Progress Notes (Signed)
Pt does not wish to use CPAP and ask for machine to be taken out of room. Rt will continue to monitor.

## 2019-05-10 NOTE — Progress Notes (Signed)
PROGRESS NOTE    [name]  AJH:[id number] DOB: 05-24-1950 DOA: 05/08/2019 PCP: Mayra Neer, MD   Brief Narrative:  Per admitting physician:  [name] is a 69 y.o. female with history of diabetes mellitus type 2, hypertension, chronic kidney disease stage III, asthma presents to the ER at [facility] with complaints of having abdominal pain for the last 2 days with diarrhea for last 4 days.  Patient states she had taken antibiotics 2 weeks ago for urinary tract infection.  Patient has been having frequent episodes of watery diarrhea for last 4 days and started developing epigastric abdominal pain which is crampy in nature episodic for the last 2 days which has become worse.  Denies any fever chills but denies any recent travel denies any shortness of breath or chest pain.  ED Course: In the ER patient was tachycardic afebrile with labs showing potassium of 5.3 creatinine of 2.2 which is increased from baseline of 1.4 WBC 25.3 lactic acid 1.2 Covid test was negative.  CT abdomen pelvis done shows features concerning for small bowel diverticulitis with diarrheal state.  Stool studies were ordered.  Patient started on ceftriaxone and Flagyl ordered patient has multiple medication allergies.  On my exam abdomen is mildly tender but has no rigidity or rebound tenderness.  Assessment & Plan:   Principal Problem:   Diverticulitis Active Problems:   HTN (hypertension)   Uncontrolled type 2 diabetes mellitus with peripheral neuropathy (HCC)   Essential hypertension   AKI (acute kidney injury) ([place])   Small bowel diverticulitis with diarrhea and recent use of antibiotics: Continue current antibiotic treatment ceftriaxone and Flagyl note multiple allergies Advance diet to full liquid If no improvement consider contacting patient's low-power gastroenterology Monday  Acute on chronic kidney disease Holding lisinopril and hydrochlorothiazide Follow labs in the  morning Anticipate this will improve with resolution of volume loss  Diabetes mellitus On a scale insulin  Hypertension Continue Toprol Hold lisinopril secondary to acute kidney injury Hold HCTZ secondary to volume loss As needed labetalol    DVT prophylaxis: SCD/Compression stockings  Code Status: Full code    Code Status Orders  (From admission, onward)         Start     Ordered   05/09/19 0241  Full code  Continuous     05/09/19 0243        Code Status History    Date Active Date Inactive Code Status Order ID Comments User Context   02/09/2012 1819 02/13/2012 1435 Full Code [id number]  Wallace Cullens, RN Inpatient   Advance Care Planning Activity     Family Communication: Discussed with husband at bedside Disposition Plan:   Patient will remain inpatient additional day for IV antibiotics advancing of diet. Consults called: None Admission status: Inpatient   Consultants:   None  Procedures:  CT ABDOMEN PELVIS WO CONTRAST  Result Date: 05/08/2019 CLINICAL DATA:  Diarrhea x4 days with a history of diverticulitis. EXAM: CT ABDOMEN AND PELVIS WITHOUT CONTRAST TECHNIQUE: Multidetector CT imaging of the abdomen and pelvis was performed following the standard protocol without IV contrast. COMPARISON:  12/30/2015 FINDINGS: Lower chest: The lung bases are clear. The heart size is normal. Hepatobiliary: The liver is normal. Status post cholecystectomy.There is no biliary ductal dilation. Pancreas: Normal contours without ductal dilatation. No peripancreatic fluid collection. Spleen: No splenic laceration or hematoma. Adrenals/Urinary Tract: --Adrenal glands: No adrenal hemorrhage. --Right kidney/ureter: No hydronephrosis or perinephric hematoma. --Left kidney/ureter: No hydronephrosis or perinephric  hematoma. --Urinary bladder: Unremarkable. Stomach/Bowel: --Stomach/Duodenum: No hiatal hernia or other gastric abnormality. Normal duodenal course and caliber. --Small bowel: There  is stents of diverticula involving loops of small bowel in the left mid abdomen, likely jejunal loops. This is similar in appearance to prior CT. There is no adjacent drainable fluid collection or free air. --Colon: There is sigmoid diverticulosis without CT evidence for diverticulitis. Liquid stool is noted in the colon. --Appendix: Normal. Vascular/Lymphatic: Atherosclerotic calcification is present within the non-aneurysmal abdominal aorta, without hemodynamically significant stenosis. --No retroperitoneal lymphadenopathy. --No mesenteric lymphadenopathy. --No pelvic or inguinal lymphadenopathy. Reproductive: Status post hysterectomy. No adnexal mass. Other: No ascites or free air. The abdominal wall is normal. Musculoskeletal. The patient is status post prior posterior fusion from L2-L1. Multilevel interbody spaces are noted. The patient is status post multilevel posterior decompression. There is a grade 1 anterolisthesis of L5 on S1. IMPRESSION: 1. As seen on prior study from 2017, again noted is uncomplicated small-bowel diverticulitis in the left mid abdomen. There is no drainable fluid collection or free air. There is no evidence for small bowel obstruction. 2. Scattered colonic diverticula without CT evidence for diverticulitis. 3. Liquid stool throughout the colon consistent with diarrhea. Aortic Atherosclerosis (ICD10-I70.0). Electronically Signed   By: Constance Holster M.D.   On: 05/08/2019 17:03     Antimicrobials:   Ceftriaxone and Flagyl   Subjective: No acute events overnight  Objective: Vitals:   05/09/19 1942 05/10/19 0400 05/10/19 0500 05/10/19 0930  BP: (!) 147/64 139/66  (!) 166/71  Pulse: 91 85  93  Resp:      Temp: 98.1 F (36.7 C) (!) 97.4 F (36.3 C)  98 F (36.7 C)  TempSrc: Oral Oral  Oral  SpO2: 100% 100%  100%  Weight:   92.1 kg   Height:        Intake/Output Summary (Last 24 hours) at 05/10/2019 1448 Last data filed at 05/10/2019 1300 Gross per 24 hour   Intake 1080 ml  Output --  Net 1080 ml   Filed Weights   05/08/19 2246 05/09/19 0420 05/10/19 0500  Weight: 95.7 kg 95.1 kg 92.1 kg    Examination:  General exam: Appears calm and comfortable  Respiratory system: Clear to auscultation. Respiratory effort normal. Cardiovascular system: S1 & S2 heard, RRR. No JVD, murmurs, rubs, gallops or clicks. No pedal edema. Gastrointestinal system: Abdomen is soft, not distended, quiet bowel sounds. Central nervous system: Alert and oriented. No focal neurological deficits. Extremities: Well perfused, no edema. Skin: No rashes, lesions or ulcers Psychiatry: Judgement and insight appear normal. Mood & affect appropriate.     Data Reviewed: I have personally reviewed following labs and imaging studies  CBC: Recent Labs  Lab 05/08/19 1517 05/09/19 0338 05/10/19 0913  WBC 25.3* 18.0* 17.8*  NEUTROABS  --  9.4* 8.3*  HGB 14.0 11.1* 11.4*  HCT 42.7 34.3* 34.9*  MCV 95.3 94.5 95.6  PLT 399 279 979   Basic Metabolic Panel: Recent Labs  Lab 05/08/19 1517 05/09/19 0338  NA 131* 138  K 5.3* 4.9  CL 102 109  CO2 17* 18*  GLUCOSE 291* 195*  BUN 73* 72*  CREATININE 2.21* 2.01*  CALCIUM 8.9 8.1*  MG  --  1.0*   GFR: Estimated Creatinine Clearance: 30 mL/min (A) (by C-G formula based on SCr of 2.01 mg/dL (H)). Liver Function Tests: Recent Labs  Lab 05/08/19 1517 05/09/19 0338  AST 11* 8*  ALT 12 10  ALKPHOS 80 59  BILITOT 0.4 0.4  PROT 6.4* 5.0*  ALBUMIN 3.6 2.8*   Recent Labs  Lab 05/08/19 1517  LIPASE 28   No results for input(s): AMMONIA in the last 168 hours. Coagulation Profile: No results for input(s): INR, PROTIME in the last 168 hours. Cardiac Enzymes: No results for input(s): CKTOTAL, CKMB, CKMBINDEX, TROPONINI in the last 168 hours. BNP (last 3 results) No results for input(s): PROBNP in the last 8760 hours. HbA1C: No results for input(s): HGBA1C in the last 72 hours. CBG: Recent Labs  Lab  05/09/19 1946 05/10/19 0027 05/10/19 0402 05/10/19 0818 05/10/19 1209  GLUCAP 178* 109* 117* 140* 160*   Lipid Profile: No results for input(s): CHOL, HDL, LDLCALC, TRIG, CHOLHDL, LDLDIRECT in the last 72 hours. Thyroid Function Tests: No results for input(s): TSH, T4TOTAL, FREET4, T3FREE, THYROIDAB in the last 72 hours. Anemia Panel: No results for input(s): VITAMINB12, FOLATE, FERRITIN, TIBC, IRON, RETICCTPCT in the last 72 hours. Sepsis Labs: Recent Labs  Lab 05/08/19 1820  LATICACIDVEN 1.2    Recent Results (from the past 240 hour(s))  C Difficile Quick Screen w PCR reflex     Status: None   Collection Time: 05/08/19  5:23 PM   Specimen: Stool  Result Value Ref Range Status   C Diff antigen NEGATIVE NEGATIVE Final   C Diff toxin NEGATIVE NEGATIVE Final   C Diff interpretation No C. difficile detected.  Final    Comment: Performed at [facility], [place] [address] [place], [place] [address]  Blood culture (routine x 2)     Status: None (Preliminary result)   Collection Time: 05/08/19  6:20 PM   Specimen: BLOOD  Result Value Ref Range Status   Specimen Description   Final    BLOOD RIGHT ANTECUBITAL Performed at [facility], [address] [place], [place] [address]    Special Requests   Final    BOTTLES DRAWN AEROBIC AND ANAEROBIC Blood Culture adequate volume Performed at [facility], [address] [place], [place] [address]    Culture   Final    NO GROWTH 2 DAYS Performed at [facility], [place] [address], [place], [place] [address]    Report Status PENDING  Incomplete  Blood culture (routine x 2)     Status: None (Preliminary result)   Collection Time: 05/08/19  6:20 PM   Specimen: BLOOD  Result Value Ref Range Status   Specimen Description   Final    BLOOD BLOOD LEFT FOREARM Performed at [facility], [address] [place], [place] [address]    Special Requests   Final    BOTTLES DRAWN AEROBIC AND ANAEROBIC  Blood Culture adequate volume Performed at [facility], [address] [place], [place] [address]    Culture   Final    NO GROWTH 2 DAYS Performed at [facility], [place] [address], [place], [place] [address]    Report Status PENDING  Incomplete  SARS CORONAVIRUS 2 (TAT 6-24 HRS) Nasopharyngeal Nasopharyngeal Swab     Status: None   Collection Time: 05/08/19  6:20 PM   Specimen: Nasopharyngeal Swab  Result Value Ref Range Status   SARS Coronavirus 2 NEGATIVE NEGATIVE Final    Comment: (NOTE) SARS-CoV-2 target nucleic acids are NOT DETECTED. The SARS-CoV-2 RNA is generally detectable in upper and lower respiratory specimens during the acute phase of infection. Negative results do not preclude SARS-CoV-2 infection, do not  rule out co-infections with other pathogens, and should not be used as the sole basis for treatment or other patient management decisions. Negative results must be combined with clinical observations, patient history, and epidemiological information. The expected result is Negative. Fact Sheet for Patients: SugarRoll.be Fact Sheet for Healthcare Providers: https://www.woods-mathews.com/ This test is not yet approved or cleared by the [place] FDA and  has been authorized for detection and/or diagnosis of SARS-CoV-2 by FDA under an Emergency Use Authorization (EUA). This EUA will remain  in effect (meaning this test can be used) for the duration of the COVID-19 declaration under Section 56 4(b)(1) of the Act, 21 U.S.C. section 360bbb-3(b)(1), unless the authorization is terminated or revoked sooner. Performed at [facility], [place] [address], [place], [place] [address]   SARS Coronavirus 2 Ag (30 min TAT) - Nasal Swab (BD Veritor Kit)     Status: None   Collection Time: 05/08/19  7:46 PM   Specimen: Nasal Swab (BD Veritor Kit)  Result Value Ref Range Status   SARS Coronavirus 2 Ag NEGATIVE  NEGATIVE Final    Comment: (NOTE) SARS-CoV-2 antigen NOT DETECTED.  Negative results are presumptive.  Negative results do not preclude SARS-CoV-2 infection and should not be used as the sole basis for treatment or other patient management decisions, including infection  control decisions, particularly in the presence of clinical signs and  symptoms consistent with COVID-19, or in those who have been in contact with the virus.  Negative results must be combined with clinical observations, patient history, and epidemiological information. The expected result is Negative. Fact Sheet for Patients: PodPark.tn Fact Sheet for Healthcare Providers: GiftContent.is This test is not yet approved or cleared by the [place] FDA and  has been authorized for detection and/or diagnosis of SARS-CoV-2 by FDA under an Emergency Use Authorization (EUA).  This EUA will remain in effect (meaning this test can be used) for the duration of  the COVID-19 de claration under Section 564(b)(1) of the Act, 21 U.S.C. section 360bbb-3(b)(1), unless the authorization is terminated or revoked sooner. Performed at [facility], [address] [place], [place] [address]   MRSA PCR Screening     Status: None   Collection Time: 05/09/19  2:15 AM   Specimen: Nasal Mucosa; Nasopharyngeal  Result Value Ref Range Status   MRSA by PCR NEGATIVE NEGATIVE Final    Comment:        The GeneXpert MRSA Assay (FDA approved for NASAL specimens only), is one component of a comprehensive MRSA colonization surveillance program. It is not intended to diagnose MRSA infection nor to guide or monitor treatment for MRSA infections. Performed at [facility], [place] [address] [place], [place] [address]          Radiology Studies: CT ABDOMEN PELVIS WO CONTRAST  Result Date: 05/08/2019 CLINICAL DATA:  Diarrhea x4 days with a history of  diverticulitis. EXAM: CT ABDOMEN AND PELVIS WITHOUT CONTRAST TECHNIQUE: Multidetector CT imaging of the abdomen and pelvis was performed following the standard protocol without IV contrast. COMPARISON:  12/30/2015 FINDINGS: Lower chest: The lung bases are clear. The heart size is normal. Hepatobiliary: The liver is normal. Status post cholecystectomy.There is no biliary ductal dilation. Pancreas: Normal contours without ductal dilatation. No peripancreatic fluid collection. Spleen: No splenic laceration or hematoma. Adrenals/Urinary Tract: --Adrenal glands: No adrenal hemorrhage. --Right kidney/ureter: No hydronephrosis or perinephric hematoma. --Left kidney/ureter: No hydronephrosis or perinephric hematoma. --Urinary bladder: Unremarkable. Stomach/Bowel: --Stomach/Duodenum: No  hiatal hernia or other gastric abnormality. Normal duodenal course and caliber. --Small bowel: There is stents of diverticula involving loops of small bowel in the left mid abdomen, likely jejunal loops. This is similar in appearance to prior CT. There is no adjacent drainable fluid collection or free air. --Colon: There is sigmoid diverticulosis without CT evidence for diverticulitis. Liquid stool is noted in the colon. --Appendix: Normal. Vascular/Lymphatic: Atherosclerotic calcification is present within the non-aneurysmal abdominal aorta, without hemodynamically significant stenosis. --No retroperitoneal lymphadenopathy. --No mesenteric lymphadenopathy. --No pelvic or inguinal lymphadenopathy. Reproductive: Status post hysterectomy. No adnexal mass. Other: No ascites or free air. The abdominal wall is normal. Musculoskeletal. The patient is status post prior posterior fusion from L2-L1. Multilevel interbody spaces are noted. The patient is status post multilevel posterior decompression. There is a grade 1 anterolisthesis of L5 on S1. IMPRESSION: 1. As seen on prior study from 2017, again noted is uncomplicated small-bowel diverticulitis  in the left mid abdomen. There is no drainable fluid collection or free air. There is no evidence for small bowel obstruction. 2. Scattered colonic diverticula without CT evidence for diverticulitis. 3. Liquid stool throughout the colon consistent with diarrhea. Aortic Atherosclerosis (ICD10-I70.0). Electronically Signed   By: Constance Holster M.D.   On: 05/08/2019 17:03        Scheduled Meds: . DULoxetine  90 mg Oral Daily  . insulin aspart  0-9 Units Subcutaneous Q4H  . metoprolol succinate  25 mg Oral Daily  . simvastatin  40 mg Oral QHS  . sodium chloride flush  3 mL Intravenous Once   Continuous Infusions: . cefTRIAXone (ROCEPHIN)  IV 2 g (05/09/19 1744)  . metronidazole 500 mg (05/10/19 0549)     LOS: 2 days    Time spent: 5 min    Nicolette Bang, MD [facility]  If 7PM-7AM, please contact night-coverage  05/10/2019, 2:48 PM

## 2019-05-10 NOTE — Plan of Care (Signed)

## 2019-05-10 NOTE — Plan of Care (Signed)
  Problem: Pain Managment: Goal: General experience of comfort will improve Outcome: Progressing   Problem: Safety: Goal: Ability to remain free from injury will improve Outcome: Progressing   Problem: Skin Integrity: Goal: Risk for impaired skin integrity will decrease Outcome: Progressing   

## 2019-05-11 LAB — BASIC METABOLIC PANEL
Anion gap: 9 (ref 5–15)
BUN: 33 mg/dL — ABNORMAL HIGH (ref 8–23)
CO2: 19 mmol/L — ABNORMAL LOW (ref 22–32)
Calcium: 8.3 mg/dL — ABNORMAL LOW (ref 8.9–10.3)
Chloride: 112 mmol/L — ABNORMAL HIGH (ref 98–111)
Creatinine, Ser: 1.27 mg/dL — ABNORMAL HIGH (ref 0.44–1.00)
GFR calc Af Amer: 51 mL/min — ABNORMAL LOW (ref >60)
GFR calc non Af Amer: 44 mL/min — ABNORMAL LOW (ref >60)
Glucose, Bld: 126 mg/dL — ABNORMAL HIGH (ref 70–99)
Potassium: 4.1 mmol/L (ref 3.5–5.1)
Sodium: 140 mmol/L (ref 135–145)

## 2019-05-11 LAB — GI PATHOGEN PANEL BY PCR, STOOL

## 2019-05-11 LAB — GLUCOSE, CAPILLARY
Glucose-Capillary: 109 mg/dL — ABNORMAL HIGH (ref 70–99)
Glucose-Capillary: 121 mg/dL — ABNORMAL HIGH (ref 70–99)
Glucose-Capillary: 146 mg/dL — ABNORMAL HIGH (ref 70–99)
Glucose-Capillary: 253 mg/dL — ABNORMAL HIGH (ref 70–99)

## 2019-05-11 LAB — CBC WITH DIFFERENTIAL/PLATELET
Abs Immature Granulocytes: 0.34 10*3/uL — ABNORMAL HIGH (ref 0.00–0.07)
Basophils Absolute: 0.1 10*3/uL (ref 0.0–0.1)
Basophils Relative: 0 %
Eosinophils Absolute: 4.8 10*3/uL — ABNORMAL HIGH (ref 0.0–0.5)
Eosinophils Relative: 31 %
HCT: 32 % — ABNORMAL LOW (ref 36.0–46.0)
Hemoglobin: 10.5 g/dL — ABNORMAL LOW (ref 12.0–15.0)
Immature Granulocytes: 2 %
Lymphocytes Relative: 17 %
Lymphs Abs: 2.5 10*3/uL (ref 0.7–4.0)
MCH: 31.1 pg (ref 26.0–34.0)
MCHC: 32.8 g/dL (ref 30.0–36.0)
MCV: 94.7 fL (ref 80.0–100.0)
Monocytes Absolute: 0.9 10*3/uL (ref 0.1–1.0)
Monocytes Relative: 6 %
Neutro Abs: 6.8 10*3/uL (ref 1.7–7.7)
Neutrophils Relative %: 44 %
Platelets: 280 10*3/uL (ref 150–400)
RBC: 3.38 MIL/uL — ABNORMAL LOW (ref 3.87–5.11)
RDW: 13 % (ref 11.5–15.5)
Smear Review: ADEQUATE
WBC: 15.4 10*3/uL — ABNORMAL HIGH (ref 4.0–10.5)
nRBC: 0 % (ref 0.0–0.2)

## 2019-05-11 MED ORDER — CEFDINIR 300 MG PO CAPS: 300.0000 mg | ORAL_CAPSULE | Freq: Two times a day (BID) | ORAL | 0 refills | Status: AC

## 2019-05-11 MED ORDER — METRONIDAZOLE 500 MG PO TABS: 500.0000 mg | ORAL_TABLET | Freq: Three times a day (TID) | ORAL | 0 refills | Status: AC

## 2019-05-11 NOTE — Progress Notes (Signed)
[name] to be D/C'd home per MD order.  Discussed prescriptions and follow up appointments with the patient. Prescriptions were sent to patient's preferred pharmacy, medication list explained in detail. Pt verbalized understanding.  Allergies as of 05/11/2019      Reactions   Victoza [liraglutide] Hives, Rash   Hydromorphone Hives, Diarrhea, Swelling, Rash   Ultram [tramadol] Rash   Betadine [povidone Iodine] Other (See Comments)   Skin peels   Bydureon [exenatide] Diarrhea, Nausea And Vomiting   Codeine Other (See Comments)   Severe headaches   Contrast Media [iodinated Diagnostic Agents] Nausea And Vomiting   Humira [adalimumab] Hives   Hydrocodone Other (See Comments)   headache   Iodine Hives   Januvia [sitagliptin] Diarrhea   Morphine And Related Nausea And Vomiting   Percocet [oxycodone-acetaminophen] Other (See Comments)   Headache   Sulfa Antibiotics Itching   Ultram [tramadol Hcl] Other (See Comments)   unknown   Valium Other (See Comments)   headache   Amoxicillin Hives, Rash   Did it involve swelling of the face/tongue/throat, SOB, or low BP? Unknown Did it involve sudden or severe rash/hives, skin peeling, or any reaction on the inside of your mouth or nose? Yes Did you need to seek medical attention at a hospital or doctor's office? Yes When did it last happen?unk If all above answers are "NO", may proceed with cephalosporin use.   Darvocet [propoxyphene N-acetaminophen] Rash, Other (See Comments)   headache   Fentanyl And Related Rash   Septra [sulfamethoxazole-trimethoprim] Rash   Wellbutrin [bupropion Hcl] Rash      Medication List    STOP taking these medications   lisinopril-hydrochlorothiazide 20-12.5 MG tablet Commonly known as: ZESTORETIC     TAKE these medications   albuterol 108 (90 Base) MCG/ACT inhaler Commonly known as: VENTOLIN HFA Inhale 1 puff into the lungs every 4 (four) hours as needed for wheezing or shortness of breath.    ammonium lactate 12 % cream Commonly known as: AMLACTIN Apply topically as needed for dry skin.   aspirin 81 MG tablet Take 81 mg by mouth daily.   cefdinir 300 MG capsule Commonly known as: OMNICEF Take 1 capsule (300 mg total) by mouth 2 (two) times daily for 11 days.   clobetasol 0.05 % topical foam Commonly known as: OLUX Apply 1 application topically daily as needed (apply to head for dry scalp).   COLLAGEN-VITAMIN C PO Take 1 tablet by mouth daily.   DULoxetine 30 MG capsule Commonly known as: CYMBALTA Take 90 mg by mouth daily.   glipiZIDE 5 MG tablet Commonly known as: GLUCOTROL TAKE 1 TABLET(5 MG) BY MOUTH TWICE DAILY BEFORE A MEAL What changed: See the new instructions.   Insulin Pen Needle 32G X 4 MM Misc Use 1x a day   Jardiance 25 MG Tabs tablet Generic drug: empagliflozin TAKE 1 TABLET BY MOUTH EVERY DAY What changed:   how much to take  how to take this  when to take this  additional instructions   LORazepam 1 MG tablet Commonly known as: ATIVAN Take 1 mg by mouth daily as needed for anxiety.   metoprolol succinate 25 MG 24 hr tablet Commonly known as: TOPROL-XL Take 25 mg by mouth daily.   metroNIDAZOLE 500 MG tablet Commonly known as: Flagyl Take 1 tablet (500 mg total) by mouth 3 (three) times daily for 11 days.   multivitamin with minerals tablet Take 1 tablet by mouth daily.   ondansetron 8 MG tablet Commonly  known as: ZOFRAN Take 8 mg by mouth 3 (three) times daily as needed for nausea or vomiting.   OneTouch Verio test strip Generic drug: glucose blood TEST TWICE DAILY   Ozempic (1 MG/DOSE) 2 MG/1.5ML Sopn Generic drug: Semaglutide (1 MG/DOSE) Inject 1 mg into the skin once a week.   PROBIOTIC DAILY PO Take 1 capsule by mouth daily.   silver sulfADIAZINE 1 % cream Commonly known as: Silvadene Apply 1 application topically daily.   simvastatin 40 MG tablet Commonly known as: ZOCOR Take 40 mg by mouth at bedtime.    Tyler Aas FlexTouch 200 UNIT/ML Sopn Generic drug: Insulin Degludec Inject 10 Units into the skin daily.   triamcinolone 0.025 % ointment Commonly known as: KENALOG Apply 1 application topically 2 (two) times daily.       Vitals:   05/11/19 0614 05/11/19 0904  BP: (!) 159/69 (!) 145/63  Pulse: 85 79  Resp: 16   Temp: (!) 97.4 F (36.3 C) 98.3 F (36.8 C)  SpO2: 100% 99%    IV catheter discontinued intact. Site without signs and symptoms of complications. Dressing and pressure applied. Telemetry removed. Pt denies pain at this time. No complaints noted.  An After Visit Summary was printed and given to the patient. Patient escorted via [place], and D/C home via private auto with husband.  [place] 05/11/2019 1:22 PM

## 2019-05-11 NOTE — Plan of Care (Signed)
  Problem: Pain Managment: Goal: General experience of comfort will improve Outcome: Progressing   Problem: Safety: Goal: Ability to remain free from injury will improve Outcome: Progressing   Problem: Skin Integrity: Goal: Risk for impaired skin integrity will decrease Outcome: Progressing   

## 2019-05-11 NOTE — Discharge Summary (Signed)
Physician Discharge Summary  [name] NLZ:[id number] DOB: 04-Jan-1951 DOA: 05/08/2019  PCP: Sherri Neer, MD  Admit date: 05/08/2019 Discharge date: 05/11/2019  Admitted From: HOME Disposition:  HOME  Recommendations for Outpatient Follow-up:  1. Follow up with PCP in 1-2 weeks 2. Please obtain BMP/CBC in one week 3. Please follow up on the following pending results:  Home Health: no Equipment/Devices: non  Discharge Condition: Stable Code Status: full Diet recommendation: Heart Healthy  Brief/Interim Summary:  69 y.o.femalewithhistory of diabetes mellitus type 2, hypertension, chronic kidney disease stage III, asthma presents to the ER at [facility] with complaints of having abdominal pain for the last 2 days with diarrhea for last 4 days. Patient states she had taken antibiotics 2 weeks ago for urinary tract infection. Patient has been having frequent episodes of watery diarrhea for last 4 days and started developing epigastric abdominal pain which is crampy in nature episodic for the last 2 days which has become worse. Denies any fever chills but denies any recent travel denies any shortness of breath or chest pain.  ED Course:In the ER patient was tachycardic afebrile with labs showing potassium of 5.3 creatinine of 2.2 which is increased from baseline of 1.4 WBC 25.3 lactic acid 1.2 Covid test was negative. CT abdomen pelvis done shows features concerning for small bowel diverticulitis with diarrheal state. Stool studies were ordered. Patient started on ceftriaxone and Flagyl ordered patient has multiple medication allergies. On my exam abdomen is mildly tender but has no rigidity or rebound tenderness. Patient was admitted. Managed conservatively with IV antibiotics. Diet was slowly advanced. At this time patient has no nausea vomiting tolerating diet advanced to soft diet did well.  No fever Abdominal pain and is interested in requesting to go home  today. She is being discharged home with Omnicef and Flagyl.  She is due to get colonoscopy and will follow up with Dr. Hilarie Padilla. lisinopril HCTZ being held due to AKI and will follow up with PCP to resume slowly next 3 to 5 days. Refer to HPI progress note for more detail. Assessment & Plan: discharge diagnosis   Diverticulitis HTN (hypertension) Uncontrolled type 2 diabetes mellitus with peripheral neuropathy ([place]) Essential hypertension AKI-improved Diabetes mellitus On a scale insulin  Consults:  none  Subjective: [name], no nausea vomiting abdominal pain.  Tolerating diet including soft diet for lunch.  Discharge Exam: Vitals:   05/11/19 0614 05/11/19 0904  BP: (!) 159/69 (!) 145/63  Pulse: 85 79  Resp: 16   Temp: (!) 97.4 F (36.3 C) 98.3 F (36.8 C)  SpO2: 100% 99%   General: Pt is alert, awake, not in acute distress Cardiovascular: RRR, S1/S2 +, no rubs, no gallops Respiratory: CTA bilaterally, no wheezing, no rhonchi Abdominal: Soft, NT, ND, bowel sounds + Extremities: no edema, no cyanosis  Discharge Instructions  Discharge Instructions    Diet general   Complete by: As directed    SOFT DIET   Discharge instructions   Complete by: As directed    Please call call MD or return to ER for similar or worsening recurring problem that brought you to hospital or if any fever,nausea/vomiting,abdominal pain, uncontrolled pain, chest pain,  shortness of breath or any other alarming symptoms.  Please follow-up your doctor as instructed in a week time and call the office for appointment.\  Hold lisinopril/hctz for 3-5 days and see pcp to resume it  Please avoid alcohol, smoking, or any other illicit substance and maintain healthy habits including  taking your regular medications as prescribed.  You were cared for by a hospitalist during your hospital stay. If you have any questions about your discharge medications or the care you received while you were in the hospital  after you are discharged, you can call the unit and ask to speak with the hospitalist on call if the hospitalist that took care of you is not available.  Once you are discharged, your primary care physician will handle any further medical issues. Please note that NO REFILLS for any discharge medications will be authorized once you are discharged, as it is imperative that you return to your primary care physician (or establish a relationship with a primary care physician if you do not have one) for your aftercare needs so that they can reassess your need for medications and monitor your lab values  PLEASE CALL DR PRYTLE OFFICE FOR COLONOSCOPY THAT YOU ARE DUE FOR IN 4-6 WKS   Increase activity slowly   Complete by: As directed      Allergies as of 05/11/2019      Reactions   Victoza [liraglutide] Hives, Rash   Hydromorphone Hives, Diarrhea, Swelling, Rash   Ultram [tramadol] Rash   Betadine [povidone Iodine] Other (See Comments)   Skin peels   Bydureon [exenatide] Diarrhea, Nausea And Vomiting   Codeine Other (See Comments)   Severe headaches   Contrast Media [iodinated Diagnostic Agents] Nausea And Vomiting   Humira [adalimumab] Hives   Hydrocodone Other (See Comments)   headache   Iodine Hives   Januvia [sitagliptin] Diarrhea   Morphine And Related Nausea And Vomiting   Percocet [oxycodone-acetaminophen] Other (See Comments)   Headache   Sulfa Antibiotics Itching   Ultram [tramadol Hcl] Other (See Comments)   unknown   Valium Other (See Comments)   headache   Amoxicillin Hives, Rash   Did it involve swelling of the face/tongue/throat, SOB, or low BP? Unknown Did it involve sudden or severe rash/hives, skin peeling, or any reaction on the inside of your mouth or nose? Yes Did you need to seek medical attention at a hospital or doctor's office? Yes When did it last happen?unk If all above answers are "NO", may proceed with cephalosporin use.   Darvocet [propoxyphene  N-acetaminophen] Rash, Other (See Comments)   headache   Fentanyl And Related Rash   Septra [sulfamethoxazole-trimethoprim] Rash   Wellbutrin [bupropion Hcl] Rash      Medication List    STOP taking these medications   lisinopril-hydrochlorothiazide 20-12.5 MG tablet Commonly known as: ZESTORETIC     TAKE these medications   albuterol 108 (90 Base) MCG/ACT inhaler Commonly known as: VENTOLIN HFA Inhale 1 puff into the lungs every 4 (four) hours as needed for wheezing or shortness of breath.   ammonium lactate 12 % cream Commonly known as: AMLACTIN Apply topically as needed for dry skin.   aspirin 81 MG tablet Take 81 mg by mouth daily.   cefdinir 300 MG capsule Commonly known as: OMNICEF Take 1 capsule (300 mg total) by mouth 2 (two) times daily for 11 days.   clobetasol 0.05 % topical foam Commonly known as: OLUX Apply 1 application topically daily as needed (apply to head for dry scalp).   COLLAGEN-VITAMIN C PO Take 1 tablet by mouth daily.   DULoxetine 30 MG capsule Commonly known as: CYMBALTA Take 90 mg by mouth daily.   glipiZIDE 5 MG tablet Commonly known as: GLUCOTROL TAKE 1 TABLET(5 MG) BY MOUTH TWICE DAILY BEFORE A MEAL What  changed: See the new instructions.   Insulin Pen Needle 32G X 4 MM Misc Use 1x a day   Jardiance 25 MG Tabs tablet Generic drug: empagliflozin TAKE 1 TABLET BY MOUTH EVERY DAY What changed:   how much to take  how to take this  when to take this  additional instructions   LORazepam 1 MG tablet Commonly known as: ATIVAN Take 1 mg by mouth daily as needed for anxiety.   metoprolol succinate 25 MG 24 hr tablet Commonly known as: TOPROL-XL Take 25 mg by mouth daily.   metroNIDAZOLE 500 MG tablet Commonly known as: Flagyl Take 1 tablet (500 mg total) by mouth 3 (three) times daily for 11 days.   multivitamin with minerals tablet Take 1 tablet by mouth daily.   ondansetron 8 MG tablet Commonly known as: ZOFRAN Take  8 mg by mouth 3 (three) times daily as needed for nausea or vomiting.   OneTouch Verio test strip Generic drug: glucose blood TEST TWICE DAILY   Ozempic (1 MG/DOSE) 2 MG/1.5ML Sopn Generic drug: Semaglutide (1 MG/DOSE) Inject 1 mg into the skin once a week.   PROBIOTIC DAILY PO Take 1 capsule by mouth daily.   silver sulfADIAZINE 1 % cream Commonly known as: Silvadene Apply 1 application topically daily.   simvastatin 40 MG tablet Commonly known as: ZOCOR Take 40 mg by mouth at bedtime.   Tyler Aas FlexTouch 200 UNIT/ML Sopn Generic drug: Insulin Degludec Inject 10 Units into the skin daily.   triamcinolone 0.025 % ointment Commonly known as: KENALOG Apply 1 application topically 2 (two) times daily.      Follow-up Information    Sherri Neer, MD Follow up in 1 week(s).   Specialty: Family Medicine Contact information: [address] [place] [address] [phone]        Jerene Bears, MD. Call in 2 week(s).   Specialty: Gastroenterology Why: FOR COLONOSCOPY Contact information: [address] [address] [phone]          Allergies  Allergen Reactions  . Victoza [Liraglutide] Hives and Rash  . Hydromorphone Hives, Diarrhea, Swelling and Rash  . Ultram [Tramadol] Rash  . Betadine [Povidone Iodine] Other (See Comments)    Skin peels  . Bydureon [Exenatide] Diarrhea and Nausea And Vomiting  . Codeine Other (See Comments)    Severe headaches  . Contrast Media [Iodinated Diagnostic Agents] Nausea And Vomiting  . Humira [Adalimumab] Hives  . Hydrocodone Other (See Comments)    headache  . Iodine Hives  . Januvia [Sitagliptin] Diarrhea  . Morphine And Related Nausea And Vomiting  . Percocet [Oxycodone-Acetaminophen] Other (See Comments)    Headache   . Sulfa Antibiotics Itching  . Ultram [Tramadol Hcl] Other (See Comments)    unknown  . Valium Other (See Comments)    headache  . Amoxicillin Hives and Rash     Did it involve swelling of the face/tongue/throat, SOB, or low BP? Unknown Did it involve sudden or severe rash/hives, skin peeling, or any reaction on the inside of your mouth or nose? Yes Did you need to seek medical attention at a hospital or doctor's office? Yes When did it last happen?unk If all above answers are "NO", may proceed with cephalosporin use.   Carlton Adam [Propoxyphene N-Acetaminophen] Rash and Other (See Comments)    headache  . Fentanyl And Related Rash  . Septra [Sulfamethoxazole-Trimethoprim] Rash  . Wellbutrin [Bupropion Hcl] Rash    The results of significant  diagnostics from this hospitalization (including imaging, microbiology, ancillary and laboratory) are listed below for reference.    Microbiology: Recent Results (from the past 240 hour(s))  GI pathogen panel by PCR, stool     Status: None   Collection Time: 05/08/19  5:23 PM   Specimen: Stool  Result Value Ref Range Status   Plesiomonas shigelloides NOT DETECTED NOT DETECTED Final   Yersinia enterocolitica NOT DETECTED NOT DETECTED Final   Vibrio NOT DETECTED NOT DETECTED Final   Enteropathogenic E coli NOT DETECTED NOT DETECTED Final   E coli (ETEC) LT/ST NOT DETECTED NOT DETECTED Final   E coli 1638 by PCR Not applicable NOT DETECTED Final   Cryptosporidium by PCR NOT DETECTED NOT DETECTED Final   Entamoeba histolytica NOT DETECTED NOT DETECTED Final   Adenovirus F 40/41 NOT DETECTED NOT DETECTED Final   Norovirus GI/GII NOT DETECTED NOT DETECTED Final   Sapovirus NOT DETECTED NOT DETECTED Final    Comment: (NOTE) Performed At: [facility] [place], [place] [id number] Rush Farmer MD OZ:[id number]    Vibrio cholerae NOT DETECTED NOT DETECTED Final   Campylobacter by PCR NOT DETECTED NOT DETECTED Final   Salmonella by PCR NOT DETECTED NOT DETECTED Final   E coli (STEC) NOT DETECTED NOT DETECTED Final   Enteroaggregative E coli NOT DETECTED NOT DETECTED Final    Shigella by PCR NOT DETECTED NOT DETECTED Final   Cyclospora cayetanensis NOT DETECTED NOT DETECTED Final   Astrovirus NOT DETECTED NOT DETECTED Final   G lamblia by PCR NOT DETECTED NOT DETECTED Final   Rotavirus A by PCR NOT DETECTED NOT DETECTED Final  C Difficile Quick Screen w PCR reflex     Status: None   Collection Time: 05/08/19  5:23 PM   Specimen: Stool  Result Value Ref Range Status   C Diff antigen NEGATIVE NEGATIVE Final   C Diff toxin NEGATIVE NEGATIVE Final   C Diff interpretation No C. difficile detected.  Final    Comment: Performed at [facility], [place] [address], [place], [place] [address]  Blood culture (routine x 2)     Status: None (Preliminary result)   Collection Time: 05/08/19  6:20 PM   Specimen: BLOOD  Result Value Ref Range Status   Specimen Description   Final    BLOOD RIGHT ANTECUBITAL Performed at [facility], [address] [place], [place] [address]    Special Requests   Final    BOTTLES DRAWN AEROBIC AND ANAEROBIC Blood Culture adequate volume Performed at [facility], [address] [place], [place] [address]    Culture   Final    NO GROWTH 2 DAYS Performed at [facility], [place] [address], [place], [place] [address]    Report Status PENDING  Incomplete  Blood culture (routine x 2)     Status: None (Preliminary result)   Collection Time: 05/08/19  6:20 PM   Specimen: BLOOD  Result Value Ref Range Status   Specimen Description   Final    BLOOD BLOOD LEFT FOREARM Performed at [facility], [address] [place], [place] [address]    Special Requests   Final    BOTTLES DRAWN AEROBIC AND ANAEROBIC Blood Culture adequate volume Performed at [facility], [address] [place], [place] [address]    Culture   Final    NO GROWTH 2 DAYS Performed at [facility],  [address], [place], [place] [address]    Report Status PENDING  Incomplete  SARS CORONAVIRUS 2 (TAT 6-24 HRS)  Nasopharyngeal Nasopharyngeal Swab     Status: None   Collection Time: 05/08/19  6:20 PM   Specimen: Nasopharyngeal Swab  Result Value Ref Range Status   SARS Coronavirus 2 NEGATIVE NEGATIVE Final    Comment: (NOTE) SARS-CoV-2 target nucleic acids are NOT DETECTED. The SARS-CoV-2 RNA is generally detectable in upper and lower respiratory specimens during the acute phase of infection. Negative results do not preclude SARS-CoV-2 infection, do not rule out co-infections with other pathogens, and should not be used as the sole basis for treatment or other patient management decisions. Negative results must be combined with clinical observations, patient history, and epidemiological information. The expected result is Negative. Fact Sheet for Patients: SugarRoll.be Fact Sheet for Healthcare Providers: https://www.woods-mathews.com/ This test is not yet approved or cleared by the [place] FDA and  has been authorized for detection and/or diagnosis of SARS-CoV-2 by FDA under an Emergency Use Authorization (EUA). This EUA will remain  in effect (meaning this test can be used) for the duration of the COVID-19 declaration under Section 56 4(b)(1) of the Act, 21 U.S.C. section 360bbb-3(b)(1), unless the authorization is terminated or revoked sooner. Performed at [facility], [place] [address] [place], [place] [address]   SARS Coronavirus 2 Ag (30 min TAT) - Nasal Swab (BD Veritor Kit)     Status: None   Collection Time: 05/08/19  7:46 PM   Specimen: Nasal Swab (BD Veritor Kit)  Result Value Ref Range Status   SARS Coronavirus 2 Ag NEGATIVE NEGATIVE Final    Comment: (NOTE) SARS-CoV-2 antigen NOT DETECTED.  Negative results are presumptive.  Negative results do not preclude SARS-CoV-2 infection and should not be used as the sole basis for treatment or other patient management decisions, including infection  control decisions, particularly  in the presence of clinical signs and  symptoms consistent with COVID-19, or in those who have been in contact with the virus.  Negative results must be combined with clinical observations, patient history, and epidemiological information. The expected result is Negative. Fact Sheet for Patients: PodPark.tn Fact Sheet for Healthcare Providers: GiftContent.is This test is not yet approved or cleared by the [place] FDA and  has been authorized for detection and/or diagnosis of SARS-CoV-2 by FDA under an Emergency Use Authorization (EUA).  This EUA will remain in effect (meaning this test can be used) for the duration of  the COVID-19 de claration under Section 564(b)(1) of the Act, 21 U.S.C. section 360bbb-3(b)(1), unless the authorization is terminated or revoked sooner. Performed at [facility], [address] [place], [place] [address]   MRSA PCR Screening     Status: None   Collection Time: 05/09/19  2:15 AM   Specimen: Nasal Mucosa; Nasopharyngeal  Result Value Ref Range Status   MRSA by PCR NEGATIVE NEGATIVE Final    Comment:        The GeneXpert MRSA Assay (FDA approved for NASAL specimens only), is one component of a comprehensive MRSA colonization surveillance program. It is not intended to diagnose MRSA infection nor to guide or monitor treatment for MRSA infections. Performed at [facility], [place] [address] [place], [place] [address]     Procedures/Studies: CT ABDOMEN PELVIS WO CONTRAST  Result Date: 05/08/2019 CLINICAL DATA:  Diarrhea x4 days with a history of diverticulitis. EXAM: CT ABDOMEN AND PELVIS WITHOUT  CONTRAST TECHNIQUE: Multidetector CT imaging of the abdomen and pelvis was performed following the standard protocol without IV contrast. COMPARISON:  12/30/2015 FINDINGS: Lower chest: The lung bases are clear. The heart size is normal. Hepatobiliary: The liver is normal. Status  post cholecystectomy.There is no biliary ductal dilation. Pancreas: Normal contours without ductal dilatation. No peripancreatic fluid collection. Spleen: No splenic laceration or hematoma. Adrenals/Urinary Tract: --Adrenal glands: No adrenal hemorrhage. --Right kidney/ureter: No hydronephrosis or perinephric hematoma. --Left kidney/ureter: No hydronephrosis or perinephric hematoma. --Urinary bladder: Unremarkable. Stomach/Bowel: --Stomach/Duodenum: No hiatal hernia or other gastric abnormality. Normal duodenal course and caliber. --Small bowel: There is stents of diverticula involving loops of small bowel in the left mid abdomen, likely jejunal loops. This is similar in appearance to prior CT. There is no adjacent drainable fluid collection or free air. --Colon: There is sigmoid diverticulosis without CT evidence for diverticulitis. Liquid stool is noted in the colon. --Appendix: Normal. Vascular/Lymphatic: Atherosclerotic calcification is present within the non-aneurysmal abdominal aorta, without hemodynamically significant stenosis. --No retroperitoneal lymphadenopathy. --No mesenteric lymphadenopathy. --No pelvic or inguinal lymphadenopathy. Reproductive: Status post hysterectomy. No adnexal mass. Other: No ascites or free air. The abdominal wall is normal. Musculoskeletal. The patient is status post prior posterior fusion from L2-L1. Multilevel interbody spaces are noted. The patient is status post multilevel posterior decompression. There is a grade 1 anterolisthesis of L5 on S1. IMPRESSION: 1. As seen on prior study from 2017, again noted is uncomplicated small-bowel diverticulitis in the left mid abdomen. There is no drainable fluid collection or free air. There is no evidence for small bowel obstruction. 2. Scattered colonic diverticula without CT evidence for diverticulitis. 3. Liquid stool throughout the colon consistent with diarrhea. Aortic Atherosclerosis (ICD10-I70.0). Electronically Signed   By:  Constance Holster M.D.   On: 05/08/2019 17:03    Labs: BNP (last 3 results) No results for input(s): BNP in the last 8760 hours. Basic Metabolic Panel: Recent Labs  Lab 05/08/19 1517 05/09/19 0338 05/11/19 0324  NA 131* 138 140  K 5.3* 4.9 4.1  CL 102 109 112*  CO2 17* 18* 19*  GLUCOSE 291* 195* 126*  BUN 73* 72* 33*  CREATININE 2.21* 2.01* 1.27*  CALCIUM 8.9 8.1* 8.3*  MG  --  1.0*  --    Liver Function Tests: Recent Labs  Lab 05/08/19 1517 05/09/19 0338  AST 11* 8*  ALT 12 10  ALKPHOS 80 59  BILITOT 0.4 0.4  PROT 6.4* 5.0*  ALBUMIN 3.6 2.8*   Recent Labs  Lab 05/08/19 1517  LIPASE 28   No results for input(s): AMMONIA in the last 168 hours. CBC: Recent Labs  Lab 05/08/19 1517 05/09/19 0338 05/10/19 0913 05/11/19 0324  WBC 25.3* 18.0* 17.8* 15.4*  NEUTROABS  --  9.4* 8.3* 6.8  HGB 14.0 11.1* 11.4* 10.5*  HCT 42.7 34.3* 34.9* 32.0*  MCV 95.3 94.5 95.6 94.7  PLT 399 279 307 280   Cardiac Enzymes: No results for input(s): CKTOTAL, CKMB, CKMBINDEX, TROPONINI in the last 168 hours. BNP: Invalid input(s): POCBNP CBG: Recent Labs  Lab 05/10/19 1620 05/10/19 1947 05/10/19 2353 05/11/19 0352 05/11/19 0905  GLUCAP 161* 186* 109* 121* 253*   D-Dimer No results for input(s): DDIMER in the last 72 hours. Hgb A1c No results for input(s): HGBA1C in the last 72 hours. Lipid Profile No results for input(s): CHOL, HDL, LDLCALC, TRIG, CHOLHDL, LDLDIRECT in the last 72 hours. Thyroid function studies No results for input(s): TSH, T4TOTAL, T3FREE, THYROIDAB in the last  72 hours.  Invalid input(s): FREET3 Anemia work up No results for input(s): VITAMINB12, FOLATE, FERRITIN, TIBC, IRON, RETICCTPCT in the last 72 hours. Urinalysis    Component Value Date/Time   COLORURINE YELLOW 05/08/2019 1620   APPEARANCEUR CLOUDY (A) 05/08/2019 1620   LABSPEC 1.025 05/08/2019 1620   PHURINE 5.5 05/08/2019 1620   GLUCOSEU >=500 (A) 05/08/2019 1620   HGBUR NEGATIVE  05/08/2019 1620   BILIRUBINUR NEGATIVE 05/08/2019 1620   KETONESUR NEGATIVE 05/08/2019 1620   PROTEINUR NEGATIVE 05/08/2019 1620   NITRITE NEGATIVE 05/08/2019 1620   LEUKOCYTESUR SMALL (A) 05/08/2019 1620   Sepsis Labs Invalid input(s): PROCALCITONIN,  WBC,  LACTICIDVEN Microbiology Recent Results (from the past 240 hour(s))  GI pathogen panel by PCR, stool     Status: None   Collection Time: 05/08/19  5:23 PM   Specimen: Stool  Result Value Ref Range Status   Plesiomonas shigelloides NOT DETECTED NOT DETECTED Final   Yersinia enterocolitica NOT DETECTED NOT DETECTED Final   Vibrio NOT DETECTED NOT DETECTED Final   Enteropathogenic E coli NOT DETECTED NOT DETECTED Final   E coli (ETEC) LT/ST NOT DETECTED NOT DETECTED Final   E coli 1610 by PCR Not applicable NOT DETECTED Final   Cryptosporidium by PCR NOT DETECTED NOT DETECTED Final   Entamoeba histolytica NOT DETECTED NOT DETECTED Final   Adenovirus F 40/41 NOT DETECTED NOT DETECTED Final   Norovirus GI/GII NOT DETECTED NOT DETECTED Final   Sapovirus NOT DETECTED NOT DETECTED Final    Comment: (NOTE) Performed At: [facility] [place], [place] [id number] Rush Farmer MD [id number]    Vibrio cholerae NOT DETECTED NOT DETECTED Final   Campylobacter by PCR NOT DETECTED NOT DETECTED Final   Salmonella by PCR NOT DETECTED NOT DETECTED Final   E coli (STEC) NOT DETECTED NOT DETECTED Final   Enteroaggregative E coli NOT DETECTED NOT DETECTED Final   Shigella by PCR NOT DETECTED NOT DETECTED Final   Cyclospora cayetanensis NOT DETECTED NOT DETECTED Final   Astrovirus NOT DETECTED NOT DETECTED Final   G lamblia by PCR NOT DETECTED NOT DETECTED Final   Rotavirus A by PCR NOT DETECTED NOT DETECTED Final  C Difficile Quick Screen w PCR reflex     Status: None   Collection Time: 05/08/19  5:23 PM   Specimen: Stool  Result Value Ref Range Status   C Diff antigen NEGATIVE NEGATIVE Final   C Diff toxin  NEGATIVE NEGATIVE Final   C Diff interpretation No C. difficile detected.  Final    Comment: Performed at [facility], [place] [address], [place], [place] [address]  Blood culture (routine x 2)     Status: None (Preliminary result)   Collection Time: 05/08/19  6:20 PM   Specimen: BLOOD  Result Value Ref Range Status   Specimen Description   Final    BLOOD RIGHT ANTECUBITAL Performed at [facility], [address] [place], [place] [address]    Special Requests   Final    BOTTLES DRAWN AEROBIC AND ANAEROBIC Blood Culture adequate volume Performed at [facility], [address] [place], [place] [address]    Culture   Final    NO GROWTH 2 DAYS Performed at [facility], [place] [address], [place], [place] [address]    Report Status PENDING  Incomplete  Blood culture (routine x 2)     Status: None (Preliminary result)   Collection Time: 05/08/19  6:20 PM   Specimen: BLOOD  Result Value Ref Range Status   Specimen Description   Final    BLOOD BLOOD LEFT FOREARM Performed at [facility], [address] [place], [place] [address]    Special Requests   Final    BOTTLES DRAWN AEROBIC AND ANAEROBIC Blood Culture adequate volume Performed at [facility], [address] [place], [place] [id number]    Culture   Final    NO GROWTH 2 DAYS Performed at [facility], [place] [address], [place], [place] [address]    Report Status PENDING  Incomplete  SARS CORONAVIRUS 2 (TAT 6-24 HRS) Nasopharyngeal Nasopharyngeal Swab     Status: None   Collection Time: 05/08/19  6:20 PM   Specimen: Nasopharyngeal Swab  Result Value Ref Range Status   SARS Coronavirus 2 NEGATIVE NEGATIVE Final    Comment: (NOTE) SARS-CoV-2 target nucleic acids are NOT DETECTED. The SARS-CoV-2 RNA is generally detectable in upper and lower respiratory specimens during the acute phase of infection. Negative results do not preclude SARS-CoV-2 infection, do not rule  out co-infections with other pathogens, and should not be used as the sole basis for treatment or other patient management decisions. Negative results must be combined with clinical observations, patient history, and epidemiological information. The expected result is Negative. Fact Sheet for Patients: SugarRoll.be Fact Sheet for Healthcare Providers: https://www.woods-mathews.com/ This test is not yet approved or cleared by the [place] FDA and  has been authorized for detection and/or diagnosis of SARS-CoV-2 by FDA under an Emergency Use Authorization (EUA). This EUA will remain  in effect (meaning this test can be used) for the duration of the COVID-19 declaration under Section 56 4(b)(1) of the Act, 21 U.S.C. section 360bbb-3(b)(1), unless the authorization is terminated or revoked sooner. Performed at [facility], [place] [address], [place], [place] [address]   SARS Coronavirus 2 Ag (30 min TAT) - Nasal Swab (BD Veritor Kit)     Status: None   Collection Time: 05/08/19  7:46 PM   Specimen: Nasal Swab (BD Veritor Kit)  Result Value Ref Range Status   SARS Coronavirus 2 Ag NEGATIVE NEGATIVE Final    Comment: (NOTE) SARS-CoV-2 antigen NOT DETECTED.  Negative results are presumptive.  Negative results do not preclude SARS-CoV-2 infection and should not be used as the sole basis for treatment or other patient management decisions, including infection  control decisions, particularly in the presence of clinical signs and  symptoms consistent with COVID-19, or in those who have been in contact with the virus.  Negative results must be combined with clinical observations, patient history, and epidemiological information. The expected result is Negative. Fact Sheet for Patients: PodPark.tn Fact Sheet for Healthcare Providers: GiftContent.is This test is not yet approved or  cleared by the [place] FDA and  has been authorized for detection and/or diagnosis of SARS-CoV-2 by FDA under an Emergency Use Authorization (EUA).  This EUA will remain in effect (meaning this test can be used) for the duration of  the COVID-19 de claration under Section 564(b)(1) of the Act, 21 U.S.C. section 360bbb-3(b)(1), unless the authorization is terminated or revoked sooner. Performed at [facility], [address] [place], [place] [address]   MRSA PCR Screening     Status: None   Collection Time: 05/09/19  2:15 AM   Specimen: Nasal Mucosa; Nasopharyngeal  Result Value Ref Range Status   MRSA by PCR NEGATIVE  NEGATIVE Final    Comment:        The GeneXpert MRSA Assay (FDA approved for NASAL specimens only), is one component of a comprehensive MRSA colonization surveillance program. It is not intended to diagnose MRSA infection nor to guide or monitor treatment for MRSA infections. Performed at [facility], [place] [address], [place], [place] [address]      Time coordinating discharge: 25  minutes  SIGNED: Antonieta Pert, MD  [facility] 05/11/2019, 10:13 AM  If 7PM-7AM, please contact night-coverage [email or web address]

## 2019-05-12 ENCOUNTER — Ambulatory Visit: Payer: PPO | Admitting: Internal Medicine

## 2019-05-12 LAB — PATHOLOGIST SMEAR REVIEW

## 2019-05-13 LAB — CULTURE, BLOOD (ROUTINE X 2)
Culture: NO GROWTH
Culture: NO GROWTH
Special Requests: ADEQUATE
Special Requests: ADEQUATE

## 2019-05-22 DIAGNOSIS — N179 Acute kidney failure, unspecified: Secondary | ICD-10-CM | POA: Diagnosis not present

## 2019-06-02 ENCOUNTER — Telehealth: Payer: Self-pay | Admitting: *Deleted

## 2019-06-02 NOTE — Telephone Encounter (Signed)
Dr Hilarie Fredrickson,  This pt was seen in the ED 05-08-2019 and dx'd per CT scan with diverticulitis and treated. She is scheduled to have a colonoscopy with you 2-16 Tuesday for a hx of TA polyps in 2011 - she saw Nevin Bloodgood in 2018 with diverticulitis as well - PV is tomorrow 1-27  Is she ok to proceed with her colon at this time?  Please advise, Thanks Lelan Pons

## 2019-06-02 NOTE — Telephone Encounter (Signed)
Her hospitalization was for small bowel diverticulitis and should be okay (assuming all symptoms resolved after antibiotics) to proceed with surveillance colonoscopy in mid Feb (as scheduled)

## 2019-06-03 ENCOUNTER — Ambulatory Visit (AMBULATORY_SURGERY_CENTER): Payer: Self-pay | Admitting: *Deleted

## 2019-06-03 ENCOUNTER — Other Ambulatory Visit: Payer: Self-pay

## 2019-06-03 ENCOUNTER — Encounter: Payer: Self-pay | Admitting: Internal Medicine

## 2019-06-03 VITALS — Temp 97.1°F | Ht 64.0 in | Wt 215.0 lb

## 2019-06-03 DIAGNOSIS — Z01818 Encounter for other preprocedural examination: Secondary | ICD-10-CM

## 2019-06-03 DIAGNOSIS — Z86010 Personal history of colonic polyps: Secondary | ICD-10-CM

## 2019-06-03 DIAGNOSIS — Z800 Family history of malignant neoplasm of digestive organs: Secondary | ICD-10-CM

## 2019-06-03 MED ORDER — PEG 3350-KCL-NA BICARB-NACL 420 G PO SOLR: 4000.0000 mL | Freq: Once | ORAL | 0 refills | Status: AC

## 2019-06-03 NOTE — Progress Notes (Signed)
Patient is here in-person for PV. Patient denies any allergies to eggs or soy. Patient denies any problems with anesthesia/sedation. PONV! Patient denies any oxygen use at home. Patient denies taking any diet/weight loss medications or blood thinners. Patient is not being treated for MRSA or C-diff.    COVID-19 screening test is on 2/11, the pt is aware. Pt is aware that care partner will wait in the car during procedure; if they feel like they will be too hot or cold to wait in the car; they may wait in the 4 th floor lobby. Patient is aware to bring only one care partner. We want them to wear a mask (we do not have any that we can provide them), practice social distancing, and we will check their temperatures when they get here.  I did remind the patient that their care partner needs to stay in the parking lot the entire time and have a cell phone available, we will call them when the pt is ready for discharge. Patient will wear mask into building.

## 2019-06-09 ENCOUNTER — Encounter: Payer: Self-pay | Admitting: Internal Medicine

## 2019-06-11 ENCOUNTER — Encounter: Payer: Self-pay | Admitting: Internal Medicine

## 2019-06-15 ENCOUNTER — Other Ambulatory Visit: Payer: Self-pay | Admitting: Internal Medicine

## 2019-06-18 ENCOUNTER — Other Ambulatory Visit: Payer: Self-pay

## 2019-06-18 ENCOUNTER — Other Ambulatory Visit: Payer: Self-pay | Admitting: Internal Medicine

## 2019-06-18 ENCOUNTER — Ambulatory Visit (INDEPENDENT_AMBULATORY_CARE_PROVIDER_SITE_OTHER): Payer: PPO

## 2019-06-18 DIAGNOSIS — Z1159 Encounter for screening for other viral diseases: Secondary | ICD-10-CM | POA: Diagnosis not present

## 2019-06-19 LAB — SARS CORONAVIRUS 2 (TAT 6-24 HRS): SARS Coronavirus 2: NEGATIVE

## 2019-06-23 ENCOUNTER — Ambulatory Visit (AMBULATORY_SURGERY_CENTER): Payer: PPO | Admitting: Internal Medicine

## 2019-06-23 ENCOUNTER — Other Ambulatory Visit: Payer: Self-pay

## 2019-06-23 ENCOUNTER — Encounter: Payer: Self-pay | Admitting: Internal Medicine

## 2019-06-23 VITALS — BP 172/88 | HR 98 | Temp 96.8°F | Resp 15 | Ht 64.0 in | Wt 215.0 lb

## 2019-06-23 DIAGNOSIS — D125 Benign neoplasm of sigmoid colon: Secondary | ICD-10-CM

## 2019-06-23 DIAGNOSIS — Z86010 Personal history of colonic polyps: Secondary | ICD-10-CM | POA: Diagnosis not present

## 2019-06-23 DIAGNOSIS — G4733 Obstructive sleep apnea (adult) (pediatric): Secondary | ICD-10-CM | POA: Diagnosis not present

## 2019-06-23 DIAGNOSIS — I10 Essential (primary) hypertension: Secondary | ICD-10-CM | POA: Diagnosis not present

## 2019-06-23 DIAGNOSIS — D122 Benign neoplasm of ascending colon: Secondary | ICD-10-CM | POA: Diagnosis not present

## 2019-06-23 DIAGNOSIS — E119 Type 2 diabetes mellitus without complications: Secondary | ICD-10-CM | POA: Diagnosis not present

## 2019-06-23 DIAGNOSIS — D123 Benign neoplasm of transverse colon: Secondary | ICD-10-CM

## 2019-06-23 DIAGNOSIS — J45909 Unspecified asthma, uncomplicated: Secondary | ICD-10-CM | POA: Diagnosis not present

## 2019-06-23 DIAGNOSIS — D124 Benign neoplasm of descending colon: Secondary | ICD-10-CM | POA: Diagnosis not present

## 2019-06-23 HISTORY — PX: COLONOSCOPY W/ POLYPECTOMY: SHX1380

## 2019-06-23 MED ORDER — SODIUM CHLORIDE 0.9 % IV SOLN: 500.0000 mL | Freq: Once | INTRAVENOUS | Status: DC

## 2019-06-23 NOTE — Op Note (Signed)
[facility] Patient Name: [name] Procedure Date: 06/23/2019 9:43 AM MRN: [id number] Endoscopist: Jerene Bears , MD Age: 69 Referring MD:  Date of Birth: 1951/03/28 Gender: Female Account #: [id number] Procedure:                Colonoscopy Indications:              High risk colon cancer surveillance: Personal                            history of multiple (3 or more) adenomas, Last                            colonoscopy: 2015 (2011 before that) Medicines:                Monitored Anesthesia Care Procedure:                Pre-Anesthesia Assessment:                           - Prior to the procedure, a History and Physical                            was performed, and patient medications and                            allergies were reviewed. The patient's tolerance of                            previous anesthesia was also reviewed. The risks                            and benefits of the procedure and the sedation                            options and risks were discussed with the patient.                            All questions were answered, and informed consent                            was obtained. Prior Anticoagulants: The patient has                            taken no previous anticoagulant or antiplatelet                            agents. ASA Grade Assessment: III - A patient with                            severe systemic disease. After reviewing the risks                            and benefits, the patient was deemed in  satisfactory condition to undergo the procedure.                           After obtaining informed consent, the colonoscope                            was passed under direct vision. Throughout the                            procedure, the patient's blood pressure, pulse, and                            oxygen saturations were monitored continuously. The                            Colonoscope was introduced  through the anus and                            advanced to the cecum. The colonoscopy was                            performed without difficulty. The patient tolerated                            the procedure well. The quality of the bowel                            preparation was good. The ileocecal valve,                            appendiceal orifice, and rectum were photographed. Scope In: 9:57:59 AM Scope Out: 10:28:51 AM Scope Withdrawal Time: 0 hours 22 minutes 41 seconds  Total Procedure Duration: 0 hours 30 minutes 52 seconds  Findings:                 The digital rectal exam was normal.                           Six sessile polyps were found in the ascending                            colon. The polyps were 4 to 8 mm in size. These                            polyps were removed with a cold snare. Resection                            and retrieval were complete.                           A 5 mm polyp was found in the transverse colon. The                            polyp was sessile. The polyp  was removed with a                            cold snare. Resection and retrieval were complete.                           Four sessile polyps were found in the descending                            colon. The polyps were 3 to 6 mm in size. These                            polyps were removed with a cold snare. Resection                            and retrieval were complete.                           A 3 mm polyp was found in the sigmoid colon. The                            polyp was sessile. The polyp was removed with a                            cold snare. Resection and retrieval were complete.                           A few small-mouthed diverticula were found in the                            sigmoid colon.                           Internal hemorrhoids were found during                            retroflexion. The hemorrhoids were medium-sized. Complications:            No  immediate complications. Estimated Blood Loss:     Estimated blood loss was minimal. Impression:               - Six 4 to 8 mm polyps in the ascending colon,                            removed with a cold snare. Resected and retrieved.                           - One 5 mm polyp in the transverse colon, removed                            with a cold snare. Resected and retrieved.                           - Four 3 to 6 mm  polyps in the descending colon,                            removed with a cold snare. Resected and retrieved.                           - One 3 mm polyp in the sigmoid colon, removed with                            a cold snare. Resected and retrieved.                           - Diverticulosis in the sigmoid colon.                           - Internal hemorrhoids. Recommendation:           - Patient has a contact number available for                            emergencies. The signs and symptoms of potential                            delayed complications were discussed with the                            patient. Return to normal activities tomorrow.                            Written discharge instructions were provided to the                            patient.                           - Resume previous diet.                           - Continue present medications.                           - Await pathology results.                           - Repeat colonoscopy is recommended for                            surveillance. The colonoscopy date will be                            determined after pathology results from today's                            exam become available for review. Jerene Bears, MD 06/23/2019 10:37:01 AM This report has been signed electronically.

## 2019-06-23 NOTE — Progress Notes (Signed)
Called to room to assist during endoscopic procedure.  Patient ID and intended procedure confirmed with present staff. Received instructions for my participation in the procedure from the performing physician.  

## 2019-06-23 NOTE — Progress Notes (Signed)
Pt's states no medical or surgical changes since previsit or office visit. 

## 2019-06-23 NOTE — Progress Notes (Signed)
Report to PACU, RN, vss, BBS= Clear.  

## 2019-06-23 NOTE — Patient Instructions (Signed)
Please, read all of the handouts given to you by your recovery room nurse. You will need another colonoscopy at least in 1 year.  Thank-you for choosing [place] for your healthcare needs today.  YOU HAD AN ENDOSCOPIC PROCEDURE TODAY AT [facility]:   Refer to the procedure report that was given to you for any specific questions about what was found during the examination.  If the procedure report does not answer your questions, please call your gastroenterologist to clarify.  If you requested that your care partner not be given the details of your procedure findings, then the procedure report has been included in a sealed envelope for you to review at your convenience later.  YOU SHOULD EXPECT: Some feelings of bloating in the abdomen. Passage of more gas than usual.  Walking can help get rid of the air that was put into your GI tract during the procedure and reduce the bloating. If you had a lower endoscopy (such as a colonoscopy or flexible sigmoidoscopy) you may notice spotting of blood in your stool or on the toilet paper. If you underwent a bowel prep for your procedure, you may not have a normal bowel movement for a few days.  Please Note:  You might notice some irritation and congestion in your nose or some drainage.  This is from the oxygen used during your procedure.  There is no need for concern and it should clear up in a day or so.  SYMPTOMS TO REPORT IMMEDIATELY:   Following lower endoscopy (colonoscopy or flexible sigmoidoscopy):  Excessive amounts of blood in the stool  Significant tenderness or worsening of abdominal pains  Swelling of the abdomen that is new, acute  Fever of 100F or higher   For urgent or emergent issues, a gastroenterologist can be reached at any hour by calling [phone].   DIET:  We do recommend a small meal at first, but then you may proceed to your regular diet.  Drink plenty of fluids but you should avoid alcoholic beverages for 24  hours. Try to use benefiber everyday so you don't get constipated.  Try to drink plenty of water.  ACTIVITY:  You should plan to take it easy for the rest of today and you should NOT DRIVE or use heavy machinery until tomorrow (because of the sedation medicines used during the test).    FOLLOW UP: Our staff will call the number listed on your records 48-72 hours following your procedure to check on you and address any questions or concerns that you may have regarding the information given to you following your procedure. If we do not reach you, we will leave a message.  We will attempt to reach you two times.  During this call, we will ask if you have developed any symptoms of COVID 19. If you develop any symptoms (ie: fever, flu-like symptoms, shortness of breath, cough etc.) before then, please call [phone].  If you test positive for Covid 19 in the 2 weeks post procedure, please call and report this information to [place].    If any biopsies were taken you will be contacted by phone or by letter within the next 1-3 weeks.  Please call us at [phone] if you have not heard about the biopsies in 3 weeks.    SIGNATURES/CONFIDENTIALITY: You and/or your care partner have signed paperwork which will be entered into your electronic medical record.  These signatures attest to the fact that that the information above on your  After Visit Summary has been reviewed and is understood.  Full responsibility of the confidentiality of this discharge information lies with you and/or your care-partner.

## 2019-06-23 NOTE — Progress Notes (Signed)
CW- vitals JB- temp 

## 2019-06-23 NOTE — Progress Notes (Signed)
approx 1010 after heavy and extended pressure on abd to get to cecum, pt noticed to be gupping.  zofran and robinul had already been given but not kicked in yet.  seadation had been paused and suction at bed side.  Pt had a quick burst of bile colored fluid.  Very small volume.  Head droppped in steep t burg and pt oropharynx suctioned.  By this time, pt followed commands to open her mouth and followed commands to swallow several times.  Pt said no burning in her throat so had raised to a semi-fowlers position and light sedation started.  Dr pyrtle aware

## 2019-06-26 ENCOUNTER — Encounter: Payer: Self-pay | Admitting: Internal Medicine

## 2019-06-26 ENCOUNTER — Telehealth: Payer: Self-pay

## 2019-06-26 NOTE — Telephone Encounter (Signed)
  Follow up Call-  Call back number 06/23/2019  Post procedure Call Back phone  # [phone]  Permission to leave phone message Yes  Some recent data might be hidden     Left message

## 2019-07-01 ENCOUNTER — Encounter: Payer: Self-pay | Admitting: Internal Medicine

## 2019-07-01 ENCOUNTER — Other Ambulatory Visit: Payer: Self-pay

## 2019-07-01 ENCOUNTER — Ambulatory Visit: Payer: PPO | Admitting: Internal Medicine

## 2019-07-01 DIAGNOSIS — E1165 Type 2 diabetes mellitus with hyperglycemia: Secondary | ICD-10-CM | POA: Diagnosis not present

## 2019-07-01 DIAGNOSIS — E1142 Type 2 diabetes mellitus with diabetic polyneuropathy: Secondary | ICD-10-CM | POA: Diagnosis not present

## 2019-07-01 DIAGNOSIS — IMO0002 Reserved for concepts with insufficient information to code with codable children: Secondary | ICD-10-CM

## 2019-07-01 LAB — POCT GLYCOSYLATED HEMOGLOBIN (HGB A1C): Hemoglobin A1C: 9.5 % — AB (ref 4.0–5.6)

## 2019-07-01 MED ORDER — TRESIBA FLEXTOUCH 200 UNIT/ML ~~LOC~~ SOPN: 36.0000 [IU] | PEN_INJECTOR | Freq: Every day | SUBCUTANEOUS | 5 refills | Status: DC

## 2019-07-01 NOTE — Progress Notes (Signed)
Patient ID: [name], female   DOB: 11/22/1950, 69 y.o.   MRN: [id number]   This visit occurred during the SARS-CoV-2 public health emergency.  Safety protocols were in place, including screening questions prior to the visit, additional usage of staff PPE, and extensive cleaning of exam room while observing appropriate contact time as indicated for disinfecting solutions.   HPI: [name] is a 69 y.o.-year-old female, initially referred by her PCP, Dr. Harley Alto for f/u for DM2, dx in 1980s, non-insulin-dependent, controlled, with complications (PN - s/p B digit amputations 2/2 osteomyelitis, PAD, foot ulcer). She saw Dr. Elyse Hsu previously. Last visit with me 5 months ago.  She was admitted with N/V/D >> diverticulitis, AKI, dehydration 05/2019. She was tx'ed with ABx >> feels much better now.  Reviewed HbA1c levels: Lab Results  Component Value Date   HGBA1C 8.4 (A) 02/04/2019   HGBA1C 8.0 (A) 09/30/2018   HGBA1C 7.1 (A) 05/29/2018  02/16/2016: 7.1% 06/2015: 6.6%  Pt is on a regimen of: - Jardiance 12.5 mg daily before b'fast - Glipizide 2.5 mg before b'fast - added back 09/2018 >> 5 mg 2x a day before meal - Tresiba 10 >> 24 units daily (increased 3 days ago) She stopped Ozempic 04/2019 due to GI symptoms. She stopped Metformin ER >> AP and diarrhea She was on Victoza >> rash. In 09/2016, she had a steroid inj in back >> sugars 200s >> used Novolog for 3 days.  She initially refused insulin.  Pt checks her sugars once a day: - am: 156-160, 247 (steroids) >> 149, 164-216 >> 98, 174-281 - 2h after b'fast: 117-168, 182 >> 148, 234 >> 164 >> 172-252, 303 - before lunch: 176, 180 >> 150-175 >> 145--204, 251 >> 231 - 2h after lunch: 120-160, 175 >> 145-160 >> 150 - before dinner:  130, 159 >> 145-170, 224 >> 150, 243 - 2h after dinner: 137-155 >> 150-171, 262 >> n/c - bedtime:  150, 165 >> 140-160 >> 272 >> 180 >> n/c - nighttime: n/c Lowest sugar was 111 >> 117  >> 140 >> 135 >> 99; she has hypoglycemia awareness in the 90s. Highest sugar was 244 >> 272 x1 >> 247 (steroids) >> 251  >> 303.  Glucometer: One Touch Verio  Pt's meals are: - Breakfast: [place] bacon, 2 eggs, toast; oatmeal; or brown rice - Lunch: Tuna, crackers or sandwich, chicken strips, chips, diet Pepsi - Dinner: Meat, veggies, starch  - Snacks: 2-3: 15g carbs (pretzels, chips)  She walks or rides her stationary bike for exercise.  -+ CKD, last BUN/creatinine:  Lab Results  Component Value Date   BUN 33 (H) 05/11/2019   BUN 72 (H) 05/09/2019   CREATININE 1.27 (H) 05/11/2019   CREATININE 2.01 (H) 05/09/2019  03/21/2018: CMP normal with the exception of high glucose at 143, BUN/creatinine 37/1.26, GFR 43 (kidney function improved from 09/2017) Urinalysis with 1+ protein5/02/2018: Glucose 123, BUN 44/creatinine 1.34, GFR 40, previously 44 03/06/2017: ACR 222.8 08/29/2016: ACR 76.7 02/16/2016: 42/1.34 (GFR 40), Glu 213  12/26/2015: 25/1.23 (GFR 44), Glu 153 On lisinopril.  -+ HL; last set of lipids: Lab Results  Component Value Date   CHOL 110 02/04/2019   HDL 31.60 (L) 02/04/2019   LDLDIRECT 44.0 02/04/2019   TRIG 255.0 (H) 02/04/2019   CHOLHDL 3 02/04/2019  03/21/2018: 92/202/31/21 09/04/2017: 126/269/33/39 08/29/2016: 87/132/32/30 02/16/2016: 128/278/33/39  03/02/2014: 114/175/32/48 On Zocor.  - last eye exam was in 2020: No DR (Dr. Gershon Crane).  She has a history of  cataract surgery.  -+ Numbness and tingling in her feet.  She sees podiatry-Dr. Jacqualyn Posey.  She also has HTN, GERD, anemia..  She had amputation of 1/2 of the left hallux 05/06/2017.  This is now healed. She started Humira (for psoriasis) but did not tolerate this >> stopped. She started PT for her back pain. Also dry needling.   ROS: Constitutional: no weight gain/+ weight loss, no fatigue, no subjective hyperthermia, no subjective hypothermia Eyes: no blurry vision, no xerophthalmia ENT: no sore  throat, no nodules palpated in neck, no dysphagia, no odynophagia, no hoarseness Cardiovascular: no CP/no SOB/no palpitations/no leg swelling Respiratory: no cough/no SOB/no wheezing Gastrointestinal: no N/no V/no D/no C/no acid reflux Musculoskeletal: no muscle aches/no joint aches Skin: no rashes, no hair loss Neurological: no tremors/+ numbness/+ tingling/no dizziness  I reviewed pt's medications, allergies, PMH, social hx, family hx, and changes were documented in the history of present illness. Otherwise, unchanged from my initial visit note.  Past Medical History:  Diagnosis Date  . Anemia in chronic renal disease 06/05/2011  . Anxiety    takes Ativan tid  . Asthma    has an inhaler prn  . Chronic kidney disease   . Cough   . DDD (degenerative disc disease), cervical 06/05/2011  . DDD (degenerative disc disease), lumbosacral 06/05/2011  . Depression    takes Cymbalta daily  . Diabetes mellitus    takes Amaryl and Metformin daily  . Diabetic neuropathy ([place])   . Diverticulosis   . Dizziness   . GERD (gastroesophageal reflux disease)    takes Protonix prn  . Headache(784.0)    occasionally  . History of bronchitis    last time 31yrs ago  . History of colon polyps   . History of MRSA infection   . HTN (hypertension) 06/05/2011   takes Prinizide daily  . Hyperlipidemia    takes Zocor daily  . Insomnia    not on any meds at present time  . Osteoarthritis   . PONV (postoperative nausea and vomiting)    hard to wake up  . Psoriasis 06/05/2011   on legs and uses cream prn  . Seasonal allergies    but doesn't take any meds  . Sleep apnea    uses CPAP  . Tubular adenoma of colon    Past Surgical History:  Procedure Laterality Date  . ABDOMINAL HYSTERECTOMY  1982 and 1984  . AMPUTATION  02/12/2012   Procedure: AMPUTATION RAY;  Surgeon: Wylene Simmer, MD;  Location: [facility];  Service: Orthopedics;  Laterality: Right;  RIGHT 2ND TOE AMPUTATION   . BACK SURGERY     x 3  .  CARDIAC CATHETERIZATION     08/09/04: 50% mid AV groove CX lesion, NL LM, LAD, Ramus, EF > 60% (Dr. Gwenlyn Found)  . CARPAL TUNNEL RELEASE Bilateral 2010  . CERVICAL SPINE SURGERY    . CHOLECYSTECTOMY  1977  . COLONOSCOPY  05/30/2009, 09/30/13   [facility] 2011/Dr.Brodie 2015  . DILATION AND CURETTAGE OF UTERUS    . ELBOW SURGERY     left d/t tendonitis  . ESOPHAGOGASTRODUODENOSCOPY    . LUMBAR WOUND DEBRIDEMENT N/A 07/15/2012   Procedure: I & D of Lumbar Wound: Possible Repair of CSF Leak;  Surgeon: Elaina Hoops, MD;  Location: [facility];  Service: Neurosurgery;  Laterality: N/A;  I & D of Lumbar Wound: Possible Repair of CSF Leak placement lumbar drain  . POLYPECTOMY    . right knee arthroscopy  x 2  . SHOULDER ARTHROSCOPY Left   . TOE AMPUTATION Left    Social History   Social History  . Marital status: Married    Spouse name: N/A  . Number of children: 2   Occupational History  . Disabled    Social History Main Topics  . Smoking status: Never Smoker  . Smokeless tobacco: Never Used     Comment: never used tobacco  . Alcohol use No  . Drug use: No   Current Outpatient Medications on File Prior to Visit  Medication Sig Dispense Refill  . albuterol (PROVENTIL HFA;VENTOLIN HFA) 108 (90 BASE) MCG/ACT inhaler Inhale 1 puff into the lungs every 4 (four) hours as needed for wheezing or shortness of breath.     Marland Kitchen ammonium lactate (AMLACTIN) 12 % cream Apply topically as needed for dry skin. 385 g 0  . aspirin 81 MG tablet Take 81 mg by mouth daily.    . clobetasol (OLUX) 0.05 % topical foam Apply 1 application topically daily as needed (apply to head for dry scalp).     . COLLAGEN-VITAMIN C PO Take 1 tablet by mouth daily.     . DULoxetine (CYMBALTA) 30 MG capsule Take 90 mg by mouth daily.  5  . glipiZIDE (GLUCOTROL) 5 MG tablet TAKE 1 TABLET(5 MG) BY MOUTH TWICE DAILY BEFORE A MEAL 180 tablet 1  . glucose blood (ONETOUCH VERIO) test strip TEST TWICE DAILY 200 strip 11  . Insulin  Degludec (TRESIBA FLEXTOUCH) 200 UNIT/ML SOPN Inject 10 Units into the skin daily. 3 pen 5  . Insulin Pen Needle 32G X 4 MM MISC Use 1x a day 100 each 3  . JARDIANCE 25 MG TABS tablet TAKE 1 TABLET BY MOUTH EVERY DAY (Patient taking differently: Take 12.5 mg by mouth daily. ) 90 tablet 3  . LORazepam (ATIVAN) 1 MG tablet Take 1 mg by mouth daily as needed for anxiety.     . metoprolol succinate (TOPROL-XL) 25 MG 24 hr tablet Take 25 mg by mouth daily.     . Multiple Vitamins-Minerals (MULTIVITAMIN WITH MINERALS) tablet Take 1 tablet by mouth daily.     . ondansetron (ZOFRAN) 8 MG tablet Take 8 mg by mouth 3 (three) times daily as needed for nausea or vomiting.     . Probiotic Product (PROBIOTIC DAILY PO) Take 1 capsule by mouth daily.     . silver sulfADIAZINE (SILVADENE) 1 % cream Apply 1 application topically daily. 50 g 0  . simvastatin (ZOCOR) 40 MG tablet Take 40 mg by mouth at bedtime.     . triamcinolone (KENALOG) 0.025 % ointment Apply 1 application topically 2 (two) times daily. 30 g 0   No current facility-administered medications on file prior to visit.   Allergies  Allergen Reactions  . Victoza [Liraglutide] Hives and Rash  . Hydromorphone Hives, Diarrhea, Swelling and Rash  . Ultram [Tramadol] Rash  . Betadine [Povidone Iodine] Other (See Comments)    Skin peels  . Bydureon [Exenatide] Diarrhea and Nausea And Vomiting  . Codeine Other (See Comments)    Severe headaches  . Contrast Media [Iodinated Diagnostic Agents] Nausea And Vomiting  . Humira [Adalimumab] Hives  . Hydrocodone Other (See Comments)    headache  . Iodine Hives  . Januvia [Sitagliptin] Diarrhea  . Morphine And Related Nausea And Vomiting  . Ozempic (0.25 Or 0.5 Mg-Dose) [Semaglutide(0.25 Or 0.5mg -Dos)] Nausea And Vomiting  . Percocet [Oxycodone-Acetaminophen] Other (See Comments)    Headache   .  Sulfa Antibiotics Itching  . Ultram [Tramadol Hcl] Other (See Comments)    unknown  . Valium Other (See  Comments)    headache  . Amoxicillin Hives and Rash    Did it involve swelling of the face/tongue/throat, SOB, or low BP? Unknown Did it involve sudden or severe rash/hives, skin peeling, or any reaction on the inside of your mouth or nose? Yes Did you need to seek medical attention at a hospital or doctor's office? Yes When did it last happen?unk If all above answers are "NO", may proceed with cephalosporin use.   Carlton Adam [Propoxyphene N-Acetaminophen] Rash and Other (See Comments)    headache  . Fentanyl And Related Rash  . Septra [Sulfamethoxazole-Trimethoprim] Rash  . Wellbutrin [Bupropion Hcl] Rash   Family History  Problem Relation Age of Onset  . Colon cancer Brother 76  . Hypertension Mother        died from infection s/p hip surgery  . Heart attack Father   . Colon cancer Maternal Grandfather   . Stomach cancer Neg Hx   . Esophageal cancer Neg Hx   . Rectal cancer Neg Hx   . Liver cancer Neg Hx   . Colon polyps Neg Hx     PE: BP (!) 140/96   Pulse 76   Ht 5\' 4"  (1.626 m)   Wt 209 lb (94.8 kg)   SpO2 99%   BMI 35.87 kg/m  Wt Readings from Last 3 Encounters:  07/01/19 209 lb (94.8 kg)  06/23/19 215 lb (97.5 kg)  06/03/19 215 lb (97.5 kg)   Constitutional: overweight, in NAD Eyes: PERRLA, EOMI, no exophthalmos ENT: moist mucous membranes, no thyromegaly, no cervical lymphadenopathy Cardiovascular: RRR, No MRG Respiratory: CTA B Gastrointestinal: abdomen soft, NT, ND, BS+ Musculoskeletal: no deformities, strength intact in all 4 Skin: moist, warm, no rashes Neurological: no tremor with outstretched hands, DTR normal in all 4  ASSESSMENT: 1. DM2, non-insulin-dependent, controlled, with complications: - PN - s/p B 2nd toe amputations 2/2 osteomyelitis - 2013 and 2015; 1/2 hallux 04/2017 - PAD - foot ulcer  2. HL  3.  Yeast vaginitis  PLAN:  1. Patient with longstanding, uncontrolled diabetes, with initially improved control, however,  worsening control in the last year.  She is on oral medications with sulfonylurea and SGLT 2 inhibitor, but also weekly GLP-1 receptor agonist.  At last visit, his sugars were high at almost all times of the day, we added long-acting insulin, and we increased the dose since then, currently on 24 units daily, dose increased 3 days ago. -At this visit, sugars are higher than in January, most likely like this episode.  In fact, they are mostly around 200.  It is possible that her diverticulitis episode is not completely resolved.  We will not restart Ozempic.  She already increased the dose of glipizide and will continue with the higher dose.  We will also increase the Tresiba dose as needed.  I explained that in the future, when her sugars improved, we will start decreasing it back. - I suggested to:  Patient Instructions  Please continue: - Jardiance 12.5 mg daily before b'fast - Glipizide 5 mg 2x a day before meals  Increase: - Tresiba to 28 units daily for the next 4 days, then increase further by 4 units every 4 days until you reach 36 units  Please come back for a follow-up appointment in 3 months.  - we checked her HbA1c: 9.5% (higher than before) - advised to check  sugars at different times of the day - 2x a day, rotating check times - advised for yearly eye exams >> she is UTD - return to clinic in 3 months    2. HL - Reviewed latest lipid panel from last visit: LDL at goal, triglycerides high, HDL low Lab Results  Component Value Date   CHOL 110 02/04/2019   HDL 31.60 (L) 02/04/2019   LDLDIRECT 44.0 02/04/2019   TRIG 255.0 (H) 02/04/2019   CHOLHDL 3 02/04/2019  - Continues the statin without side effects.  3.  Yeast vaginitis -Most likely related to Jardiance -No active symptoms now  Philemon Kingdom, MD PhD [facility] Endocrinology

## 2019-07-01 NOTE — Addendum Note (Signed)
Addended by: Cardell Peach I on: 07/01/2019 10:08 AM   Modules accepted: Orders

## 2019-07-01 NOTE — Patient Instructions (Addendum)
Please continue: - Jardiance 12.5 mg daily before b'fast - Glipizide 5 mg 2x a day before meals  Increase: - Tresiba to 28 units daily for the next 4 days, then increase further by 4 units every 4 days until you reach 36 units  Please come back for a follow-up appointment in 3 months.

## 2019-07-03 DIAGNOSIS — M1711 Unilateral primary osteoarthritis, right knee: Secondary | ICD-10-CM | POA: Diagnosis not present

## 2019-07-08 DIAGNOSIS — G4733 Obstructive sleep apnea (adult) (pediatric): Secondary | ICD-10-CM | POA: Diagnosis not present

## 2019-07-22 ENCOUNTER — Other Ambulatory Visit: Payer: Self-pay | Admitting: Internal Medicine

## 2019-07-22 ENCOUNTER — Encounter: Payer: Self-pay | Admitting: Internal Medicine

## 2019-07-22 ENCOUNTER — Telehealth: Payer: Self-pay

## 2019-07-22 DIAGNOSIS — IMO0002 Reserved for concepts with insufficient information to code with codable children: Secondary | ICD-10-CM

## 2019-07-22 DIAGNOSIS — E1142 Type 2 diabetes mellitus with diabetic polyneuropathy: Secondary | ICD-10-CM

## 2019-07-22 MED ORDER — TRESIBA FLEXTOUCH 200 UNIT/ML ~~LOC~~ SOPN: 44.0000 [IU] | PEN_INJECTOR | Freq: Every day | SUBCUTANEOUS | 5 refills | Status: DC

## 2019-07-22 NOTE — Telephone Encounter (Signed)
Patientt's husband called states that she could not pick up new RX.  I called the pharmacy and verified the dose that Dr Cruzita Lederer sent in and they state that she can come pick it up.   ATC patient and LM on VM told her that she would be able to go pick up her RX or she could call me back.

## 2019-07-27 DIAGNOSIS — M25561 Pain in right knee: Secondary | ICD-10-CM | POA: Diagnosis not present

## 2019-07-28 ENCOUNTER — Other Ambulatory Visit: Payer: Self-pay | Admitting: Orthopaedic Surgery

## 2019-07-28 DIAGNOSIS — M25561 Pain in right knee: Secondary | ICD-10-CM

## 2019-08-07 DIAGNOSIS — M25461 Effusion, right knee: Secondary | ICD-10-CM | POA: Diagnosis not present

## 2019-08-07 DIAGNOSIS — M65861 Other synovitis and tenosynovitis, right lower leg: Secondary | ICD-10-CM | POA: Diagnosis not present

## 2019-08-07 DIAGNOSIS — M1711 Unilateral primary osteoarthritis, right knee: Secondary | ICD-10-CM | POA: Diagnosis not present

## 2019-08-07 DIAGNOSIS — S83231A Complex tear of medial meniscus, current injury, right knee, initial encounter: Secondary | ICD-10-CM | POA: Diagnosis not present

## 2019-08-07 DIAGNOSIS — S83271A Complex tear of lateral meniscus, current injury, right knee, initial encounter: Secondary | ICD-10-CM | POA: Diagnosis not present

## 2019-08-08 DIAGNOSIS — G4733 Obstructive sleep apnea (adult) (pediatric): Secondary | ICD-10-CM | POA: Diagnosis not present

## 2019-08-12 DIAGNOSIS — M1711 Unilateral primary osteoarthritis, right knee: Secondary | ICD-10-CM | POA: Diagnosis not present

## 2019-08-26 DIAGNOSIS — Z6839 Body mass index (BMI) 39.0-39.9, adult: Secondary | ICD-10-CM | POA: Diagnosis not present

## 2019-08-26 DIAGNOSIS — M1711 Unilateral primary osteoarthritis, right knee: Secondary | ICD-10-CM | POA: Diagnosis not present

## 2019-09-02 DIAGNOSIS — M1711 Unilateral primary osteoarthritis, right knee: Secondary | ICD-10-CM | POA: Diagnosis not present

## 2019-09-07 DIAGNOSIS — G4733 Obstructive sleep apnea (adult) (pediatric): Secondary | ICD-10-CM | POA: Diagnosis not present

## 2019-09-09 DIAGNOSIS — M1711 Unilateral primary osteoarthritis, right knee: Secondary | ICD-10-CM | POA: Diagnosis not present

## 2019-09-22 DIAGNOSIS — G4733 Obstructive sleep apnea (adult) (pediatric): Secondary | ICD-10-CM | POA: Diagnosis not present

## 2019-09-29 ENCOUNTER — Ambulatory Visit: Payer: PPO | Admitting: Internal Medicine

## 2019-09-29 ENCOUNTER — Encounter: Payer: Self-pay | Admitting: Internal Medicine

## 2019-09-29 ENCOUNTER — Other Ambulatory Visit: Payer: Self-pay

## 2019-09-29 VITALS — BP 128/70 | HR 65 | Ht 64.0 in | Wt 221.5 lb

## 2019-09-29 DIAGNOSIS — E1142 Type 2 diabetes mellitus with diabetic polyneuropathy: Secondary | ICD-10-CM | POA: Diagnosis not present

## 2019-09-29 DIAGNOSIS — E1165 Type 2 diabetes mellitus with hyperglycemia: Secondary | ICD-10-CM | POA: Diagnosis not present

## 2019-09-29 DIAGNOSIS — IMO0002 Reserved for concepts with insufficient information to code with codable children: Secondary | ICD-10-CM

## 2019-09-29 DIAGNOSIS — E785 Hyperlipidemia, unspecified: Secondary | ICD-10-CM

## 2019-09-29 DIAGNOSIS — E669 Obesity, unspecified: Secondary | ICD-10-CM | POA: Diagnosis not present

## 2019-09-29 LAB — POCT GLYCOSYLATED HEMOGLOBIN (HGB A1C): Hemoglobin A1C: 10.3 % — AB (ref 4.0–5.6)

## 2019-09-29 MED ORDER — OZEMPIC (1 MG/DOSE) 4 MG/3ML ~~LOC~~ SOPN: 1.0000 mg | PEN_INJECTOR | SUBCUTANEOUS | 3 refills | Status: DC

## 2019-09-29 MED ORDER — TOUJEO MAX SOLOSTAR 300 UNIT/ML ~~LOC~~ SOPN: PEN_INJECTOR | SUBCUTANEOUS | 5 refills | Status: DC

## 2019-09-29 NOTE — Addendum Note (Signed)
Addended by: Marian Sorrow on: 09/29/2019 11:49 AM   Modules accepted: Orders

## 2019-09-29 NOTE — Progress Notes (Signed)
Patient ID: AUDRAE PRANKE, female   DOB: 1950-09-06, 69 y.o.   MRN: [id number]   This visit occurred during the SARS-CoV-2 public health emergency.  Safety protocols were in place, including screening questions prior to the visit, additional usage of staff PPE, and extensive cleaning of exam room while observing appropriate contact time as indicated for disinfecting solutions.   HPI: ITZAMAR ROSKAM is a 69 y.o.-year-old female, initially referred by her PCP, Dr. Harley Alto for f/u for DM2, dx in 1980s, non-insulin-dependent, controlled, with complications (PN - s/p B digit amputations 2/2 osteomyelitis, PAD, foot ulcer). She saw Dr. Elyse Hsu previously. Last visit with me 3 months ago.  She had knee pain and infections since last OV (on ABx, steroid inj's), also had bronchitis - no po steroids. Sugars are much higher.  Reviewed HbA1c levels: Lab Results  Component Value Date   HGBA1C 9.5 (A) 07/01/2019   HGBA1C 8.4 (A) 02/04/2019   HGBA1C 8.0 (A) 09/30/2018  02/16/2016: 7.1% 06/2015: 6.6%  Pt is on a regimen of: - Jardiance 12.5 mg daily before b'fast - Glipizide 2.5 mg before b'fast - added back 09/2018 >> 5 mg 2x a day before meal - Tresiba 10 >> 24 >> 28 >> 44 units daily  She stopped Ozempic 04/2019 due to GI symptoms. She stopped Metformin ER >> AP and diarrhea She was on Victoza >> rash. In 09/2016, she had a steroid inj in back >> sugars 200s >> used Novolog for 3 days.  She initially refused insulin.  Pt checks her sugars once a day: - am: 156-160, 247 (steroids) >> 149, 164-216 >> 98, 174-281 >> 134, 146-228 - 2h after b'fast: 117-168, 182 >> 148, 234 >> 164 >> 172-252, 303 >> 151-185, 436 - before lunch: 176, 180 >> 150-175 >> 145--204, 251 >> 231 >> 150-218, 368 - 2h after lunch: 120-160, 175 >> 145-160 >> 150 >> 160-177 - before dinner:  130, 159 >> 145-170, 224 >> 150, 243 >> 150-231 - 2h after dinner: 137-155 >> 150-171, 262 >> n/c >> 160, 170, 319 - bedtime:   150, 165 >> 140-160 >> 272 >> 180 >> n/c >> 259 - nighttime: n/c Lowest sugar was 99; she has hypoglycemia awareness in the 90s. Highest sugar was 303.  Glucometer: One Touch Verio  Pt's meals are: - Breakfast: [place] bacon, 2 eggs, toast; oatmeal; or brown rice - Lunch: Tuna, crackers or sandwich, chicken strips, chips, diet Pepsi - Dinner: Meat, veggies, starch  - Snacks: 2-3: 15g carbs (pretzels, chips)  She walks or rides her stationary bike for exercise.  -+ CKD, last BUN/creatinine:  Lab Results  Component Value Date   BUN 33 (H) 05/11/2019   BUN 72 (H) 05/09/2019   CREATININE 1.27 (H) 05/11/2019   CREATININE 2.01 (H) 05/09/2019  03/21/2018: CMP normal with the exception of high glucose at 143, BUN/creatinine 37/1.26, GFR 43 (kidney function improved from 09/2017) Urinalysis with 1+ protein 09/13/2017: Glucose 123, BUN 44/creatinine 1.34, GFR 40, previously 44 03/06/2017: ACR 222.8 08/29/2016: ACR 76.7 02/16/2016: 42/1.34 (GFR 40), Glu 213  12/26/2015: 25/1.23 (GFR 44), Glu 153 On lisinopril.  -+ HL; last set of lipids: Lab Results  Component Value Date   CHOL 110 02/04/2019   HDL 31.60 (L) 02/04/2019   LDLDIRECT 44.0 02/04/2019   TRIG 255.0 (H) 02/04/2019   CHOLHDL 3 02/04/2019  03/21/2018: 92/202/31/21 09/04/2017: 126/269/33/39 08/29/2016: 87/132/32/30 02/16/2016: 128/278/33/39  03/02/2014: 114/175/32/48 On Zocor  - last eye exam was in 2020: No DR (Dr. Gershon Crane).  She has a history of cataract surgery.  - she has Numbness and tingling in her feet.  She sees podiatry -Dr. Jacqualyn Posey.  She also has HTN, GERD, anemia..  She had amputation of 1/2 of the left hallux 05/06/2017.  This is now healed. She started Humira (for psoriasis) but did not tolerate this >> stopped. She started PT for her back pain. Also dry needling.   ROS: Constitutional: no weight gain/no weight loss, no fatigue, no subjective hyperthermia, no subjective hypothermia Eyes: no blurry vision,  no xerophthalmia ENT: no sore throat, no nodules palpated in neck, no dysphagia, no odynophagia, no hoarseness Cardiovascular: no CP/no SOB/no palpitations/no leg swelling Respiratory: no cough/no SOB/no wheezing Gastrointestinal: no N/no V/no D/no C/no acid reflux Musculoskeletal: no muscle aches/no joint aches Skin: no rashes, no hair loss Neurological: no tremors/+ numbness/+ tingling/no dizziness  I reviewed pt's medications, allergies, PMH, social hx, family hx, and changes were documented in the history of present illness. Otherwise, unchanged from my initial visit note.  Past Medical History:  Diagnosis Date  . Anemia in chronic renal disease 06/05/2011  . Anxiety    takes Ativan tid  . Asthma    has an inhaler prn  . Chronic kidney disease   . Cough   . DDD (degenerative disc disease), cervical 06/05/2011  . DDD (degenerative disc disease), lumbosacral 06/05/2011  . Depression    takes Cymbalta daily  . Diabetes mellitus    takes Amaryl and Metformin daily  . Diabetic neuropathy ([place])   . Diverticulosis   . Dizziness   . GERD (gastroesophageal reflux disease)    takes Protonix prn  . Headache(784.0)    occasionally  . History of bronchitis    last time 56yrs ago  . History of colon polyps   . History of MRSA infection   . HTN (hypertension) 06/05/2011   takes Prinizide daily  . Hyperlipidemia    takes Zocor daily  . Insomnia    not on any meds at present time  . Osteoarthritis   . PONV (postoperative nausea and vomiting)    hard to wake up  . Psoriasis 06/05/2011   on legs and uses cream prn  . Seasonal allergies    but doesn't take any meds  . Sleep apnea    uses CPAP  . Tubular adenoma of colon    Past Surgical History:  Procedure Laterality Date  . ABDOMINAL HYSTERECTOMY  1982 and 1984  . AMPUTATION  02/12/2012   Procedure: AMPUTATION RAY;  Surgeon: Wylene Simmer, MD;  Location: [facility];  Service: Orthopedics;  Laterality: Right;  RIGHT 2ND TOE AMPUTATION    . BACK SURGERY     x 3  . CARDIAC CATHETERIZATION     08/09/04: 50% mid AV groove CX lesion, NL LM, LAD, Ramus, EF > 60% (Dr. Gwenlyn Found)  . CARPAL TUNNEL RELEASE Bilateral 2010  . CERVICAL SPINE SURGERY    . CHOLECYSTECTOMY  1977  . COLONOSCOPY  05/30/2009, 09/30/13   [facility] 2011/Dr.Brodie 2015  . DILATION AND CURETTAGE OF UTERUS    . ELBOW SURGERY     left d/t tendonitis  . ESOPHAGOGASTRODUODENOSCOPY    . LUMBAR WOUND DEBRIDEMENT N/A 07/15/2012   Procedure: I & D of Lumbar Wound: Possible Repair of CSF Leak;  Surgeon: Elaina Hoops, MD;  Location: [facility];  Service: Neurosurgery;  Laterality: N/A;  I & D of Lumbar Wound: Possible Repair of CSF Leak placement lumbar drain  . POLYPECTOMY    .  right knee arthroscopy     x 2  . SHOULDER ARTHROSCOPY Left   . TOE AMPUTATION Left    Social History   Social History  . Marital status: Married    Spouse name: N/A  . Number of children: 2   Occupational History  . Disabled    Social History Main Topics  . Smoking status: Never Smoker  . Smokeless tobacco: Never Used     Comment: never used tobacco  . Alcohol use No  . Drug use: No   Current Outpatient Medications on File Prior to Visit  Medication Sig Dispense Refill  . albuterol (PROVENTIL HFA;VENTOLIN HFA) 108 (90 BASE) MCG/ACT inhaler Inhale 1 puff into the lungs every 4 (four) hours as needed for wheezing or shortness of breath.     Marland Kitchen ammonium lactate (AMLACTIN) 12 % cream Apply topically as needed for dry skin. 385 g 0  . aspirin 81 MG tablet Take 81 mg by mouth daily.    . clobetasol (OLUX) 0.05 % topical foam Apply 1 application topically daily as needed (apply to head for dry scalp).     . COLLAGEN-VITAMIN C PO Take 1 tablet by mouth daily.     . DULoxetine (CYMBALTA) 30 MG capsule Take 90 mg by mouth daily.  5  . glipiZIDE (GLUCOTROL) 5 MG tablet TAKE 1 TABLET(5 MG) BY MOUTH TWICE DAILY BEFORE A MEAL 180 tablet 1  . glucose blood (ONETOUCH VERIO) test strip TEST TWICE DAILY  200 strip 11  . insulin degludec (TRESIBA FLEXTOUCH) 200 UNIT/ML FlexTouch Pen Inject 44 Units into the skin daily. 6 pen 5  . Insulin Pen Needle 32G X 4 MM MISC Use 1x a day 100 each 3  . JARDIANCE 25 MG TABS tablet TAKE 1 TABLET BY MOUTH EVERY DAY (Patient taking differently: Take 12.5 mg by mouth daily. ) 90 tablet 3  . LORazepam (ATIVAN) 1 MG tablet Take 1 mg by mouth daily as needed for anxiety.     . metoprolol succinate (TOPROL-XL) 25 MG 24 hr tablet Take 25 mg by mouth daily.     . Multiple Vitamins-Minerals (MULTIVITAMIN WITH MINERALS) tablet Take 1 tablet by mouth daily.     . ondansetron (ZOFRAN) 8 MG tablet Take 8 mg by mouth 3 (three) times daily as needed for nausea or vomiting.     . Probiotic Product (PROBIOTIC DAILY PO) Take 1 capsule by mouth daily.     . silver sulfADIAZINE (SILVADENE) 1 % cream Apply 1 application topically daily. 50 g 0  . simvastatin (ZOCOR) 40 MG tablet Take 40 mg by mouth at bedtime.     . triamcinolone (KENALOG) 0.025 % ointment Apply 1 application topically 2 (two) times daily. 30 g 0   No current facility-administered medications on file prior to visit.   Allergies  Allergen Reactions  . Victoza [Liraglutide] Hives and Rash  . Hydromorphone Hives, Diarrhea, Swelling and Rash  . Ultram [Tramadol] Rash  . Betadine [Povidone Iodine] Other (See Comments)    Skin peels  . Bydureon [Exenatide] Diarrhea and Nausea And Vomiting  . Codeine Other (See Comments)    Severe headaches  . Contrast Media [Iodinated Diagnostic Agents] Nausea And Vomiting  . Humira [Adalimumab] Hives  . Hydrocodone Other (See Comments)    headache  . Iodine Hives  . Januvia [Sitagliptin] Diarrhea  . Morphine And Related Nausea And Vomiting  . Ozempic (0.25 Or 0.5 Mg-Dose) [Semaglutide(0.25 Or 0.5mg -Dos)] Nausea And Vomiting  . Percocet [Oxycodone-Acetaminophen] Other (  See Comments)    Headache   . Sulfa Antibiotics Itching  . Ultram [Tramadol Hcl] Other (See Comments)     unknown  . Valium Other (See Comments)    headache  . Amoxicillin Hives and Rash    Did it involve swelling of the face/tongue/throat, SOB, or low BP? Unknown Did it involve sudden or severe rash/hives, skin peeling, or any reaction on the inside of your mouth or nose? Yes Did you need to seek medical attention at a hospital or doctor's office? Yes When did it last happen?unk If all above answers are "NO", may proceed with cephalosporin use.   Carlton Adam [Propoxyphene N-Acetaminophen] Rash and Other (See Comments)    headache  . Fentanyl And Related Rash  . Septra [Sulfamethoxazole-Trimethoprim] Rash  . Wellbutrin [Bupropion Hcl] Rash   Family History  Problem Relation Age of Onset  . Colon cancer Brother 21  . Hypertension Mother        died from infection s/p hip surgery  . Heart attack Father   . Colon cancer Maternal Grandfather   . Stomach cancer Neg Hx   . Esophageal cancer Neg Hx   . Rectal cancer Neg Hx   . Liver cancer Neg Hx   . Colon polyps Neg Hx     PE: BP 128/70 (BP Location: Left Arm, Patient Position: Sitting, Cuff Size: Large)   Pulse 65   Ht 5\' 4"  (1.626 m)   Wt 221 lb 8 oz (100.5 kg)   SpO2 99%   BMI 38.02 kg/m  Wt Readings from Last 3 Encounters:  09/29/19 221 lb 8 oz (100.5 kg)  07/01/19 209 lb (94.8 kg)  06/23/19 215 lb (97.5 kg)   Constitutional: overweight, in NAD Eyes: PERRLA, EOMI, no exophthalmos ENT: moist mucous membranes, no thyromegaly, no cervical lymphadenopathy Cardiovascular: RRR, No MRG Respiratory: CTA B Gastrointestinal: abdomen soft, NT, ND, BS+ Musculoskeletal: no deformities, strength intact in all 4 Skin: moist, warm, no rashes Neurological: no tremor with outstretched hands, DTR normal in all 4  ASSESSMENT: 1. DM2, non-insulin-dependent, controlled, with complications: - PN - s/p B 2nd toe amputations 2/2 osteomyelitis - 2013 and 2015; 1/2 hallux 04/2017 - PAD - foot ulcer  2. HL  3.  Obesity class  II  PLAN:  1. Patient with longstanding, uncontrolled, type 2 diabetes, which initially improved control, but worsening in the last year.  She is on oral medications with sulfonylurea and SGLT 2 inhibitor and also long-acting insulin.  She was on Ozempic before but she developed a diverticulitis episode around which she stopped the Ozempic.  The episode was not completely resolved at last visit so we did not restart Ozempic then.  We did increase the Tresiba at that time. -At this visit, sugars are much higher, despite of that she increase her Tresiba dose even further, to 44 units daily.  At this point, I am wondering whether Tyler Aas is working for her.  Therefore, we discussed about trying another long-acting insulin, in the form of Toujeo, to see how she responds to this.  Also, since her GI symptoms are almost entirely resolved, she agrees to restart on Ozempic.  She was on 1 mg Ozempic weekly before but we will start at 0.5 mg weekly for at least 1 dose.  We will continue glipizide and Jardiance for now - I suggested to:  Patient Instructions  Please continue: - Jardiance 12.5 mg daily before b'fast - Glipizide 5 mg 2x a day before meals  Change  from [place] to: - Toujeo 36 units at bedtime - then increase to 40 units daily if needed - then increase to 44 units daily if needed  Please start: - Ozempic 0.5 mg weekly x 1, then increase to 1 mg weekly  Please come back for a follow-up appointment in 3 months.  - we checked her HbA1c: 10.3% (higher)  - advised to check sugars at different times of the day - 1-2x a day, rotating check times - advised for yearly eye exams >> she is UTD - return to clinic in 3 months  2. HL -Reviewed latest lipid panel from 01/2019: LDL at goal, triglycerides high, HDL low Lab Results  Component Value Date   CHOL 110 02/04/2019   HDL 31.60 (L) 02/04/2019   LDLDIRECT 44.0 02/04/2019   TRIG 255.0 (H) 02/04/2019   CHOLHDL 3 02/04/2019  -Continues the  statin without side effects.  3.  Obesity class II -Continue SGLT 2 inhibitor, which should also help with weight loss -We will restart Ozempic at this visit -she gained 12 lbs since last OV  Philemon Kingdom, MD PhD [facility] Endocrinology

## 2019-09-29 NOTE — Patient Instructions (Signed)
Please continue: - Jardiance 12.5 mg daily before b'fast - Glipizide 5 mg 2x a day before meals  Change from [place] to: - Toujeo 36 units at bedtime - then increase to 40 units daily if needed - then increase to 44 units daily if needed  Please start: - Ozempic 0.5 mg weekly x 1, then increase to 1 mg weekly  Please come back for a follow-up appointment in 3 months.

## 2019-10-08 ENCOUNTER — Other Ambulatory Visit: Payer: Self-pay | Admitting: *Deleted

## 2019-10-08 DIAGNOSIS — G4733 Obstructive sleep apnea (adult) (pediatric): Secondary | ICD-10-CM | POA: Diagnosis not present

## 2019-10-08 MED ORDER — TRIAMCINOLONE ACETONIDE 0.025 % EX OINT: 1.0000 "application " | TOPICAL_OINTMENT | Freq: Two times a day (BID) | CUTANEOUS | 0 refills | Status: DC

## 2019-10-08 NOTE — Telephone Encounter (Signed)
Refilled the triamcinolone 0.025 mg ointment 15 gm. [name]

## 2019-10-12 DIAGNOSIS — M1711 Unilateral primary osteoarthritis, right knee: Secondary | ICD-10-CM | POA: Diagnosis not present

## 2019-10-16 ENCOUNTER — Encounter: Payer: Self-pay | Admitting: Internal Medicine

## 2019-10-16 DIAGNOSIS — Z79899 Other long term (current) drug therapy: Secondary | ICD-10-CM | POA: Diagnosis not present

## 2019-10-16 DIAGNOSIS — L400 Psoriasis vulgaris: Secondary | ICD-10-CM | POA: Diagnosis not present

## 2019-10-19 ENCOUNTER — Other Ambulatory Visit: Payer: Self-pay

## 2019-10-19 MED ORDER — METRONIDAZOLE 250 MG PO TABS
250.0000 mg | ORAL_TABLET | Freq: Three times a day (TID) | ORAL | 0 refills | Status: DC
Start: 2019-10-19 — End: 2020-04-22

## 2019-10-19 MED ORDER — CIPROFLOXACIN HCL 500 MG PO TABS
500.0000 mg | ORAL_TABLET | Freq: Two times a day (BID) | ORAL | 0 refills | Status: DC
Start: 2019-10-19 — End: 2020-04-22

## 2019-10-19 NOTE — Telephone Encounter (Signed)
She does have a history of small bowel diverticulosis and diverticulitis However please make her aware the diabetes medication may be causing her specific GI symptoms in this case It is reasonable to treat empirically with antibiotics given her history for diverticulitis  Metronidazole 250 mg 3 times daily along with ciprofloxacin 500 mg twice daily all x7 days She should let me know if symptoms fail to improve along with the prescribing provider of her diabetes medication

## 2019-10-20 ENCOUNTER — Encounter: Payer: Self-pay | Admitting: Internal Medicine

## 2019-10-30 ENCOUNTER — Encounter: Payer: Self-pay | Admitting: Internal Medicine

## 2019-11-07 DIAGNOSIS — G4733 Obstructive sleep apnea (adult) (pediatric): Secondary | ICD-10-CM | POA: Diagnosis not present

## 2019-11-11 DIAGNOSIS — M1711 Unilateral primary osteoarthritis, right knee: Secondary | ICD-10-CM | POA: Diagnosis not present

## 2019-12-08 DIAGNOSIS — G4733 Obstructive sleep apnea (adult) (pediatric): Secondary | ICD-10-CM | POA: Diagnosis not present

## 2019-12-15 DIAGNOSIS — Z1231 Encounter for screening mammogram for malignant neoplasm of breast: Secondary | ICD-10-CM | POA: Diagnosis not present

## 2019-12-16 ENCOUNTER — Other Ambulatory Visit: Payer: Self-pay | Admitting: Internal Medicine

## 2019-12-22 DIAGNOSIS — G4733 Obstructive sleep apnea (adult) (pediatric): Secondary | ICD-10-CM | POA: Diagnosis not present

## 2019-12-25 DIAGNOSIS — E119 Type 2 diabetes mellitus without complications: Secondary | ICD-10-CM | POA: Diagnosis not present

## 2019-12-25 DIAGNOSIS — Z961 Presence of intraocular lens: Secondary | ICD-10-CM | POA: Diagnosis not present

## 2019-12-31 DIAGNOSIS — N183 Chronic kidney disease, stage 3 unspecified: Secondary | ICD-10-CM | POA: Diagnosis not present

## 2019-12-31 DIAGNOSIS — F322 Major depressive disorder, single episode, severe without psychotic features: Secondary | ICD-10-CM | POA: Diagnosis not present

## 2019-12-31 DIAGNOSIS — E1129 Type 2 diabetes mellitus with other diabetic kidney complication: Secondary | ICD-10-CM | POA: Diagnosis not present

## 2019-12-31 DIAGNOSIS — I129 Hypertensive chronic kidney disease with stage 1 through stage 4 chronic kidney disease, or unspecified chronic kidney disease: Secondary | ICD-10-CM | POA: Diagnosis not present

## 2019-12-31 DIAGNOSIS — Z89422 Acquired absence of other left toe(s): Secondary | ICD-10-CM | POA: Diagnosis not present

## 2019-12-31 DIAGNOSIS — E782 Mixed hyperlipidemia: Secondary | ICD-10-CM | POA: Diagnosis not present

## 2019-12-31 DIAGNOSIS — M1990 Unspecified osteoarthritis, unspecified site: Secondary | ICD-10-CM | POA: Diagnosis not present

## 2020-01-08 DIAGNOSIS — G4733 Obstructive sleep apnea (adult) (pediatric): Secondary | ICD-10-CM | POA: Diagnosis not present

## 2020-01-14 ENCOUNTER — Other Ambulatory Visit: Payer: Self-pay | Admitting: Internal Medicine

## 2020-01-14 DIAGNOSIS — IMO0002 Reserved for concepts with insufficient information to code with codable children: Secondary | ICD-10-CM

## 2020-01-14 DIAGNOSIS — E1142 Type 2 diabetes mellitus with diabetic polyneuropathy: Secondary | ICD-10-CM

## 2020-01-20 ENCOUNTER — Other Ambulatory Visit: Payer: Self-pay

## 2020-01-20 ENCOUNTER — Ambulatory Visit: Payer: PPO | Admitting: Internal Medicine

## 2020-01-20 ENCOUNTER — Encounter: Payer: Self-pay | Admitting: Internal Medicine

## 2020-01-20 VITALS — BP 144/62 | HR 78 | Ht 64.0 in | Wt 224.0 lb

## 2020-01-20 DIAGNOSIS — E669 Obesity, unspecified: Secondary | ICD-10-CM

## 2020-01-20 DIAGNOSIS — IMO0002 Reserved for concepts with insufficient information to code with codable children: Secondary | ICD-10-CM

## 2020-01-20 DIAGNOSIS — E1142 Type 2 diabetes mellitus with diabetic polyneuropathy: Secondary | ICD-10-CM | POA: Diagnosis not present

## 2020-01-20 DIAGNOSIS — E785 Hyperlipidemia, unspecified: Secondary | ICD-10-CM

## 2020-01-20 DIAGNOSIS — E1165 Type 2 diabetes mellitus with hyperglycemia: Secondary | ICD-10-CM | POA: Diagnosis not present

## 2020-01-20 LAB — POCT GLYCOSYLATED HEMOGLOBIN (HGB A1C): Hemoglobin A1C: 9.1 % — AB (ref 4.0–5.6)

## 2020-01-20 NOTE — Progress Notes (Signed)
Patient ID: [name], female   DOB: 09-01-1950, 69 y.o.   MRN: [id number]   This visit occurred during the SARS-CoV-2 public health emergency.  Safety protocols were in place, including screening questions prior to the visit, additional usage of staff PPE, and extensive cleaning of exam room while observing appropriate contact time as indicated for disinfecting solutions.   HPI: [name] is a 69 y.o.-year-old female, initially referred by her PCP, Dr. Harley Alto for f/u for DM2, dx in 1980s, insulin-dependent, controlled, with complications (PN - s/p B digit amputations 2/2 osteomyelitis, PAD, foot ulcer). She saw Dr. Elyse Hsu previously. Last visit with me 3.5 months ago.  Before last visit, she had knee pain and infections, including bronchitis, and was on antibiotics and steroid injections.  Sugars were much higher. Knee surgery is again delayed due to the coronavirus pandemic.   Reviewed her HbA1c levels: Lab Results  Component Value Date   HGBA1C 10.3 (A) 09/29/2019   HGBA1C 9.5 (A) 07/01/2019   HGBA1C 8.4 (A) 02/04/2019  02/16/2016: 7.1% 06/2015: 6.6%  Pt is on a regimen of: - Jardiance 12.5 mg daily before b'fast - Glipizide 2.5 mg before b'fast - added back 09/2018 >> 5 mg 2x a day before meal - Tresiba 10 >> 24 >> 28 >> 44 >> Toujeo >> Tresiba 20 >> 42 units daily  She stopped Ozempic 04/2019 due to GI symptoms.  We retried it in 09/2019 with same symptoms so he had to stop. She stopped Metformin ER >> AP and diarrhea She was on Victoza >> rash. In 09/2016, she had a steroid inj in back >> sugars 200s >> used Novolog for 3 days.  She initially refused insulin.  Pt checks her sugars once a day: - am: 98, 174-281 >> 134, 146-228 >> 168-238, 303 - 2h after b'fast: 172-252, 303 >> 151-185, 436 >> 160-222, 250 - before lunch: 145--204, 251 >> 231 >> 150-218, 368 >> 178-230 - 2h after lunch: 145-160 >> 150 >> 160-177 >> 200-260 - before dinner: 145-170, 224 >>  150, 243 >> 150-231 >> 183-237, 274 - 2h after dinner: 150-171, 262 >> n/c >> 160, 170, 319 >> 200-217 - bedtime:  140-160 >> 272 >> 180 >> n/c >> 259 >> n/c - nighttime: n/c Lowest sugar was 99 >> 168; she has hypoglycemia awareness in the 90s. Highest sugar was 303 >> 436 >> 303.  Glucometer: One Touch Verio  Pt's meals are: - Breakfast: [place] bacon, 2 eggs, toast; oatmeal; or brown rice - Lunch: Tuna, crackers or sandwich, chicken strips, chips, diet Pepsi - Dinner: Meat, veggies, starch  - Snacks: 2-3: 15g carbs (pretzels, chips)  She walks for exercise or rides her stationary bike.  -+ CKD, last BUN/creatinine:  12/31/2019: 42/1.37, GFR 38, glucose 193, otherwise normal CMP Lab Results  Component Value Date   BUN 33 (H) 05/11/2019   BUN 72 (H) 05/09/2019   CREATININE 1.27 (H) 05/11/2019   CREATININE 2.01 (H) 05/09/2019  03/21/2018: CMP normal with the exception of high glucose at 143, BUN/creatinine 37/1.26, GFR 43 (kidney function improved from 09/2017) Urinalysis with 1+ protein 09/13/2017: Glucose 123, BUN 44/creatinine 1.34, GFR 40, previously 44 03/06/2017: ACR 222.8 08/29/2016: ACR 76.7 02/16/2016: 42/1.34 (GFR 40), Glu 213  12/26/2015: 25/1.23 (GFR 44), Glu 153 On lisinopril.  -+ HL; last set of lipids: 12/31/2019: 102/147/33/43 Lab Results  Component Value Date   CHOL 110 02/04/2019   HDL 31.60 (L) 02/04/2019   LDLDIRECT 44.0 02/04/2019   TRIG  255.0 (H) 02/04/2019   CHOLHDL 3 02/04/2019  03/21/2018: 92/202/31/21 09/04/2017: 126/269/33/39 08/29/2016: 87/132/32/30 02/16/2016: 128/278/33/39  03/02/2014: 114/175/32/48 On Zocor.  - last eye exam was 12/2019: No DR (Dr. Gershon Crane).  She has a history of cataract surgery.  -+ Numbness and tingling in her feet.  She sees podiatry -Dr. Jacqualyn Posey.  She also has HTN, GERD, anemia..  She had amputation of 1/2 of the left hallux 05/06/2017.  This is now healed. She started Humira (for psoriasis) but did not tolerate this  >> stopped. She started PT for her back pain. Also dry needling.   ROS: Constitutional: no weight gain/no weight loss, no fatigue, no subjective hyperthermia, no subjective hypothermia Eyes: no blurry vision, no xerophthalmia ENT: no sore throat, no nodules palpated in neck, no dysphagia, no odynophagia, no hoarseness Cardiovascular: no CP/no SOB/no palpitations/no leg swelling Respiratory: no cough/no SOB/no wheezing Gastrointestinal: no N/no V/no D/no C/no acid reflux Musculoskeletal: no muscle aches/no joint aches Skin: no rashes, no hair loss Neurological: no tremors/+ numbness/+ tingling/no dizziness  I reviewed pt's medications, allergies, PMH, social hx, family hx, and changes were documented in the history of present illness. Otherwise, unchanged from my initial visit note.  Past Medical History:  Diagnosis Date  . Anemia in chronic renal disease 06/05/2011  . Anxiety    takes Ativan tid  . Asthma    has an inhaler prn  . Chronic kidney disease   . Cough   . DDD (degenerative disc disease), cervical 06/05/2011  . DDD (degenerative disc disease), lumbosacral 06/05/2011  . Depression    takes Cymbalta daily  . Diabetes mellitus    takes Amaryl and Metformin daily  . Diabetic neuropathy ([place])   . Diverticulosis   . Dizziness   . GERD (gastroesophageal reflux disease)    takes Protonix prn  . Headache(784.0)    occasionally  . History of bronchitis    last time 15yrs ago  . History of colon polyps   . History of MRSA infection   . HTN (hypertension) 06/05/2011   takes Prinizide daily  . Hyperlipidemia    takes Zocor daily  . Insomnia    not on any meds at present time  . Osteoarthritis   . PONV (postoperative nausea and vomiting)    hard to wake up  . Psoriasis 06/05/2011   on legs and uses cream prn  . Seasonal allergies    but doesn't take any meds  . Sleep apnea    uses CPAP  . Tubular adenoma of colon    Past Surgical History:  Procedure Laterality Date   . ABDOMINAL HYSTERECTOMY  1982 and 1984  . AMPUTATION  02/12/2012   Procedure: AMPUTATION RAY;  Surgeon: Wylene Simmer, MD;  Location: [facility];  Service: Orthopedics;  Laterality: Right;  RIGHT 2ND TOE AMPUTATION   . BACK SURGERY     x 3  . CARDIAC CATHETERIZATION     08/09/04: 50% mid AV groove CX lesion, NL LM, LAD, Ramus, EF > 60% (Dr. Gwenlyn Found)  . CARPAL TUNNEL RELEASE Bilateral 2010  . CERVICAL SPINE SURGERY    . CHOLECYSTECTOMY  1977  . COLONOSCOPY  05/30/2009, 09/30/13   [facility] 2011/Dr.Brodie 2015  . DILATION AND CURETTAGE OF UTERUS    . ELBOW SURGERY     left d/t tendonitis  . ESOPHAGOGASTRODUODENOSCOPY    . LUMBAR WOUND DEBRIDEMENT N/A 07/15/2012   Procedure: I & D of Lumbar Wound: Possible Repair of CSF Leak;  Surgeon: Elaina Hoops, MD;  Location: [facility];  Service: Neurosurgery;  Laterality: N/A;  I & D of Lumbar Wound: Possible Repair of CSF Leak placement lumbar drain  . POLYPECTOMY    . right knee arthroscopy     x 2  . SHOULDER ARTHROSCOPY Left   . TOE AMPUTATION Left    Social History   Social History  . Marital status: Married    Spouse name: N/A  . Number of children: 2   Occupational History  . Disabled    Social History Main Topics  . Smoking status: Never Smoker  . Smokeless tobacco: Never Used     Comment: never used tobacco  . Alcohol use No  . Drug use: No   Current Outpatient Medications on File Prior to Visit  Medication Sig Dispense Refill  . albuterol (PROVENTIL HFA;VENTOLIN HFA) 108 (90 BASE) MCG/ACT inhaler Inhale 1 puff into the lungs every 4 (four) hours as needed for wheezing or shortness of breath.     Marland Kitchen aspirin 81 MG tablet Take 81 mg by mouth daily.    . ciprofloxacin (CIPRO) 500 MG tablet Take 1 tablet (500 mg total) by mouth 2 (two) times daily. 14 tablet 0  . clobetasol (OLUX) 0.05 % topical foam Apply 1 application topically daily as needed (apply to head for dry scalp).     . DULoxetine (CYMBALTA) 30 MG capsule Take 90 mg by mouth  daily.  5  . glipiZIDE (GLUCOTROL) 5 MG tablet TAKE 1 TABLET(5 MG) BY MOUTH TWICE DAILY BEFORE A MEAL 180 tablet 1  . glucose blood (ONETOUCH VERIO) test strip TEST TWICE DAILY 200 strip 11  . insulin glargine, 2 Unit Dial, (TOUJEO MAX SOLOSTAR) 300 UNIT/ML Solostar Pen Inject 44 units under skin in the evening 3 pen 5  . Insulin Pen Needle 32G X 4 MM MISC Use 1x a day 100 each 3  . JARDIANCE 25 MG TABS tablet TAKE 1 TABLET BY MOUTH EVERY DAY (Patient taking differently: Take 12.5 mg by mouth daily. ) 90 tablet 3  . LORazepam (ATIVAN) 1 MG tablet Take 1 mg by mouth daily as needed for anxiety.     . metoprolol succinate (TOPROL-XL) 25 MG 24 hr tablet Take 25 mg by mouth daily.     . metroNIDAZOLE (FLAGYL) 250 MG tablet Take 1 tablet (250 mg total) by mouth 3 (three) times daily. 21 tablet 0  . Multiple Vitamins-Minerals (MULTIVITAMIN WITH MINERALS) tablet Take 1 tablet by mouth daily.     . ondansetron (ZOFRAN) 8 MG tablet Take 8 mg by mouth 3 (three) times daily as needed for nausea or vomiting.     . Probiotic Product (PROBIOTIC DAILY PO) Take 1 capsule by mouth daily.     . Semaglutide, 1 MG/DOSE, (OZEMPIC, 1 MG/DOSE,) 4 MG/3ML SOPN Inject 1 mg into the skin once a week. 9 mL 3  . silver sulfADIAZINE (SILVADENE) 1 % cream Apply 1 application topically daily. 50 g 0  . simvastatin (ZOCOR) 40 MG tablet Take 40 mg by mouth at bedtime.     . TRESIBA FLEXTOUCH 200 UNIT/ML FlexTouch Pen INJECT 44 UNITS UNDER THE SKIN DAILY 18 mL 2  . triamcinolone (KENALOG) 0.025 % ointment Apply 1 application topically 2 (two) times daily. 30 g 0   No current facility-administered medications on file prior to visit.   Allergies  Allergen Reactions  . Victoza [Liraglutide] Hives and Rash  . Hydromorphone Hives, Diarrhea, Swelling and Rash  . Ultram [Tramadol] Rash  . Betadine [  Povidone Iodine] Other (See Comments)    Skin peels  . Bydureon [Exenatide] Diarrhea and Nausea And Vomiting  . Codeine Other (See  Comments)    Severe headaches  . Contrast Media [Iodinated Diagnostic Agents] Nausea And Vomiting  . Humira [Adalimumab] Hives  . Hydrocodone Other (See Comments)    headache  . Iodine Hives  . Januvia [Sitagliptin] Diarrhea  . Morphine And Related Nausea And Vomiting  . Ozempic (0.25 Or 0.5 Mg-Dose) [Semaglutide(0.25 Or 0.5mg -Dos)] Nausea And Vomiting  . Percocet [Oxycodone-Acetaminophen] Other (See Comments)    Headache   . Sulfa Antibiotics Itching  . Ultram [Tramadol Hcl] Other (See Comments)    unknown  . Valium Other (See Comments)    headache  . Amoxicillin Hives and Rash    Did it involve swelling of the face/tongue/throat, SOB, or low BP? Unknown Did it involve sudden or severe rash/hives, skin peeling, or any reaction on the inside of your mouth or nose? Yes Did you need to seek medical attention at a hospital or doctor's office? Yes When did it last happen?unk If all above answers are "NO", may proceed with cephalosporin use.   Carlton Adam [Propoxyphene N-Acetaminophen] Rash and Other (See Comments)    headache  . Fentanyl And Related Rash  . Septra [Sulfamethoxazole-Trimethoprim] Rash  . Wellbutrin [Bupropion Hcl] Rash   Family History  Problem Relation Age of Onset  . Colon cancer Brother 36  . Hypertension Mother        died from infection s/p hip surgery  . Heart attack Father   . Colon cancer Maternal Grandfather   . Stomach cancer Neg Hx   . Esophageal cancer Neg Hx   . Rectal cancer Neg Hx   . Liver cancer Neg Hx   . Colon polyps Neg Hx     PE: BP (!) 144/62   Pulse 78   Ht 5\' 4"  (1.626 m)   Wt 224 lb (101.6 kg)   SpO2 98%   BMI 38.45 kg/m  Wt Readings from Last 3 Encounters:  01/20/20 224 lb (101.6 kg)  09/29/19 221 lb 8 oz (100.5 kg)  07/01/19 209 lb (94.8 kg)   Constitutional: overweight, in NAD Eyes: PERRLA, EOMI, no exophthalmos ENT: moist mucous membranes, no thyromegaly, no cervical lymphadenopathy Cardiovascular: RRR, No  MRG Respiratory: CTA B Gastrointestinal: abdomen soft, NT, ND, BS+ Musculoskeletal: no deformities, strength intact in all 4 Skin: moist, warm, no rashes Neurological: no tremor with outstretched hands, DTR normal in all 4  ASSESSMENT: 1. DM2, insulin-dependent, controlled, with complications: - PN - s/p B 2nd toe amputations 2/2 osteomyelitis - 2013 and 2015; 1/2 hallux 04/2017 - PAD - foot ulcer  2. HL  3.  Obesity class II  PLAN:  1. Patient with longstanding, uncontrolled, type 2 diabetes, with initially improved control but worsening in the last year.  She is on sulfonylurea and SGLT2 inhibitor and also long-acting insulin.  In the past, she has been on Ozempic but developed a diverticulitis episode so we stopped the GLP-1 receptor agonist.  At last visit, her HbA1c was higher, at 10.3% and sugars were also much higher, so we retried Ozempic but she could not tolerate it due to stomach pain.  She did see Dr. Hilarie Fredrickson, her gastroenterologist..  She is now off the GLP-1 receptor agonist.  At last visit she was on a higher dose of Tresiba, 44 units daily.  I suggested to change to [facility] but she still had stomach pain and switched back  to [place]. -At this visit, sugars remain elevated and they are above goal at all times of the day.  It does not look that the sugars improved significantly after meals, but they are high even before she eats.  Therefore, I advised him to continue to increase [place] and we can continue to do so until she reaches 70 to 80 units a day.  If there is no improvement on this dose of Tresiba, we will need to change her long-acting insulin. -At today's visit, we also discussed about improving her diet.  I suggested a more plant-based diet, which she is open to.  I mentioned specific suggestions about what to eat for different meals.  This will decrease her insulin resistance and will allow [place] to better control her diabetes. -Reviewed together her latest kidney function,  which was lower than before.  She may need a referral to nephrology in the near future.  For now, I think we can continue the Jardiance since we are using this at the half maximal dose.  This will also help with slowing down her kidney function decline. - I suggested to:  Patient Instructions  Please continue: - Jardiance 12.5 mg daily before b'fast - Glipizide 5 mg 2x a day before meals  Please increase: - Tresiba 50 units at bedtime (please increase to 60 units if sugars are still high in am: >140).  Please come back for a follow-up appointment in 3 months.  - we checked her HbA1c: 9.1% (better) - advised to check sugars at different times of the day - 1-2x a day, rotating check times - advised for yearly eye exams >> she is UTD - return to clinic in 3 months  2. HL -Reviewed latest lipid panel from 12/31/2019 and her fractions were all at goal: 102/147/33/43 -Continues the statin without side effects.  3.  Obesity class II -We will continue the SGLT2 inhibitor which should help with weight loss -We started Ozempic at last visit but she developed stomach pain and we had to stop -She gained 12 pounds before last visit and 3 pounds since last visit  Philemon Kingdom, MD PhD [facility] Endocrinology

## 2020-01-20 NOTE — Patient Instructions (Addendum)
Please continue: - Jardiance 12.5 mg daily before b'fast - Glipizide 5 mg 2x a day before meals  Please increase: - Tresiba 50 units at bedtime (please increase to 60 units if sugars are still high in am: >140).  Please come back for a follow-up appointment in 3 months.

## 2020-01-21 DIAGNOSIS — G4733 Obstructive sleep apnea (adult) (pediatric): Secondary | ICD-10-CM | POA: Diagnosis not present

## 2020-02-04 ENCOUNTER — Encounter: Payer: Self-pay | Admitting: Internal Medicine

## 2020-02-04 ENCOUNTER — Other Ambulatory Visit: Payer: Self-pay | Admitting: Internal Medicine

## 2020-02-04 MED ORDER — JARDIANCE 25 MG PO TABS: ORAL_TABLET | ORAL | 1 refills | Status: DC

## 2020-02-05 MED ORDER — JARDIANCE 25 MG PO TABS: ORAL_TABLET | ORAL | 1 refills | Status: DC

## 2020-02-05 NOTE — Addendum Note (Signed)
Addended by: Cardell Peach I on: 02/05/2020 04:35 PM   Modules accepted: Orders

## 2020-02-05 NOTE — Telephone Encounter (Signed)
[facility] called asking to please specify the directions for the Jardiance. And please fax to:  [phone]  (Ph# [phone])

## 2020-02-07 DIAGNOSIS — G4733 Obstructive sleep apnea (adult) (pediatric): Secondary | ICD-10-CM | POA: Diagnosis not present

## 2020-02-23 DIAGNOSIS — G4733 Obstructive sleep apnea (adult) (pediatric): Secondary | ICD-10-CM | POA: Diagnosis not present

## 2020-03-07 ENCOUNTER — Other Ambulatory Visit: Payer: Self-pay | Admitting: Internal Medicine

## 2020-03-07 DIAGNOSIS — E1165 Type 2 diabetes mellitus with hyperglycemia: Secondary | ICD-10-CM

## 2020-03-07 DIAGNOSIS — E1142 Type 2 diabetes mellitus with diabetic polyneuropathy: Secondary | ICD-10-CM

## 2020-03-07 DIAGNOSIS — IMO0002 Reserved for concepts with insufficient information to code with codable children: Secondary | ICD-10-CM

## 2020-03-09 DIAGNOSIS — G4733 Obstructive sleep apnea (adult) (pediatric): Secondary | ICD-10-CM | POA: Diagnosis not present

## 2020-04-08 DIAGNOSIS — G4733 Obstructive sleep apnea (adult) (pediatric): Secondary | ICD-10-CM | POA: Diagnosis not present

## 2020-04-20 ENCOUNTER — Encounter: Payer: Self-pay | Admitting: Internal Medicine

## 2020-04-20 DIAGNOSIS — IMO0002 Reserved for concepts with insufficient information to code with codable children: Secondary | ICD-10-CM

## 2020-04-20 DIAGNOSIS — E1142 Type 2 diabetes mellitus with diabetic polyneuropathy: Secondary | ICD-10-CM

## 2020-04-21 MED ORDER — TRESIBA FLEXTOUCH 200 UNIT/ML ~~LOC~~ SOPN: PEN_INJECTOR | SUBCUTANEOUS | 2 refills | Status: DC

## 2020-04-22 ENCOUNTER — Ambulatory Visit (INDEPENDENT_AMBULATORY_CARE_PROVIDER_SITE_OTHER): Payer: PPO | Admitting: Internal Medicine

## 2020-04-22 ENCOUNTER — Encounter: Payer: Self-pay | Admitting: Internal Medicine

## 2020-04-22 ENCOUNTER — Other Ambulatory Visit: Payer: Self-pay

## 2020-04-22 VITALS — BP 130/70 | HR 79 | Ht 64.0 in | Wt 225.4 lb

## 2020-04-22 DIAGNOSIS — E1142 Type 2 diabetes mellitus with diabetic polyneuropathy: Secondary | ICD-10-CM | POA: Diagnosis not present

## 2020-04-22 DIAGNOSIS — E785 Hyperlipidemia, unspecified: Secondary | ICD-10-CM | POA: Diagnosis not present

## 2020-04-22 DIAGNOSIS — E669 Obesity, unspecified: Secondary | ICD-10-CM

## 2020-04-22 DIAGNOSIS — IMO0002 Reserved for concepts with insufficient information to code with codable children: Secondary | ICD-10-CM

## 2020-04-22 DIAGNOSIS — E1165 Type 2 diabetes mellitus with hyperglycemia: Secondary | ICD-10-CM | POA: Diagnosis not present

## 2020-04-22 LAB — POCT GLYCOSYLATED HEMOGLOBIN (HGB A1C): Hemoglobin A1C: 9.4 % — AB (ref 4.0–5.6)

## 2020-04-22 MED ORDER — INSULIN ASPART 100 UNIT/ML ~~LOC~~ SOLN
7.0000 [IU] | Freq: Three times a day (TID) | SUBCUTANEOUS | 3 refills | Status: DC
Start: 2020-04-22 — End: 2021-02-21

## 2020-04-22 MED ORDER — NOVOFINE PLUS PEN NEEDLE 32G X 4 MM MISC: 3 refills | Status: DC

## 2020-04-22 NOTE — Progress Notes (Signed)
Patient ID: [name], female   DOB: 1950/09/04, 69 y.o.   MRN: [id number]   This visit occurred during the SARS-CoV-2 public health emergency.  Safety protocols were in place, including screening questions prior to the visit, additional usage of staff PPE, and extensive cleaning of exam room while observing appropriate contact time as indicated for disinfecting solutions.   HPI: [name] is a 69 y.o.-year-old female, initially referred by her PCP, Dr. Harley Alto for f/u for DM2, dx in 1980s, insulin-dependent, controlled, with complications (PN - s/p B digit amputations 2/2 osteomyelitis, PAD, foot ulcer). She saw Dr. Elyse Hsu previously. Last visit with me 3 months ago.  She had bronchitis >> on ABx.  Reviewed her HbA1c levels: Lab Results  Component Value Date   HGBA1C 9.1 (A) 01/20/2020   HGBA1C 10.3 (A) 09/29/2019   HGBA1C 9.5 (A) 07/01/2019  02/16/2016: 7.1% 06/2015: 6.6%  Pt is on a regimen of: - Jardiance 12.5 mg daily before b'fast - Glipizide 2.5 mg before b'fast - added back 09/2018 >> 5 mg 2x a day before meals - Tresiba 10 >> 24 >> 28 >> 44 >> Toujeo >> Tresiba 20 >> 42 >> 50 >> 52 units daily  (felt poorly at 54 units daily) She stopped Ozempic 04/2019 due to GI symptoms.  We retried it in 09/2019 with same symptoms so he had to stop. She stopped Metformin ER >> AP and diarrhea She was on Victoza >> rash. In 09/2016, she had a steroid inj in back >> sugars 200s >> used Novolog for 3 days.  She initially refused insulin.  Pt checks her sugars once a day: - am: 98, 174-281 >> 134, 146-228 >> 168-238, 303 >> 129-206 - 2h after b'fast: 151-185, 436 >> 160-222, 250 >> 129-191, 231 - before lunch: 231 >> 150-218, 368 >> 178-230 >> 145-201, 231 - 2h after lunch: 150 >> 160-177 >> 200-260 >> 194, 220 - before dinner:150, 243 >> 150-231 >> 183-237, 274 >> 150-269 - 2h after dinner: n/c >> 160, 170, 319 >> 200-217 >> 150, 216 - bedtime:  272 >> 180 >> n/c >>  259 >> n/c - nighttime: n/c Lowest sugar was 99 >> 168; she has hypoglycemia awareness in the 90s. Highest sugar was 303 >> 436 >> 303 >> 331 (sick).  Glucometer: One Touch Verio  Pt's meals are: - Breakfast: [place] bacon, 2 eggs, toast; oatmeal; or brown rice - Lunch: Tuna, crackers or sandwich, chicken strips, chips, diet Pepsi - Dinner: Meat, veggies, starch  - Snacks: 2-3: 15g carbs (pretzels, chips)  She walks for exercise or rides her stationary bike.  -+ CKD, last BUN/creatinine:  12/31/2019: 42/1.37, GFR 38, glucose 193, otherwise normal CMP Lab Results  Component Value Date   BUN 33 (H) 05/11/2019   BUN 72 (H) 05/09/2019   CREATININE 1.27 (H) 05/11/2019   CREATININE 2.01 (H) 05/09/2019  03/21/2018: CMP normal with the exception of high glucose at 143, BUN/creatinine 37/1.26, GFR 43 (kidney function improved from 09/2017) Urinalysis with 1+ protein 09/13/2017: Glucose 123, BUN 44/creatinine 1.34, GFR 40, previously 44 03/06/2017: ACR 222.8 08/29/2016: ACR 76.7 02/16/2016: 42/1.34 (GFR 40), Glu 213  12/26/2015: 25/1.23 (GFR 44), Glu 153 She is now off lisinopril..  -+ HL; last set of lipids: 12/31/2019: 102/147/33/43 Lab Results  Component Value Date   CHOL 110 02/04/2019   HDL 31.60 (L) 02/04/2019   LDLDIRECT 44.0 02/04/2019   TRIG 255.0 (H) 02/04/2019   CHOLHDL 3 02/04/2019  03/21/2018: 92/202/31/21 09/04/2017: 126/269/33/39  08/29/2016: 87/132/32/30 02/16/2016: 128/278/33/39  03/02/2014: 114/175/32/48 On Zocor 40.  - last eye exam was 12/2019: (Dr. Gershon Crane).  She has a history of cataract surgery.  - + Numbness and tingling in her feet.  She sees podiatry -Dr. Jacqualyn Posey.  She also has HTN, GERD, anemia. She had amputation of 1/2 of the left hallux 05/06/2017.  This is now healed. She started Humira (for psoriasis) but did not tolerate this >> stopped. She started PT for her back pain. Also dry needling.   ROS: Constitutional: no weight gain/no weight loss, no  fatigue, no subjective hyperthermia, no subjective hypothermia Eyes: no blurry vision, no xerophthalmia ENT: no sore throat, no nodules palpated in neck, no dysphagia, no odynophagia, no hoarseness Cardiovascular: no CP/no SOB/no palpitations/no leg swelling Respiratory: no cough/no SOB/no wheezing Gastrointestinal: no N/no V/no D/no C/no acid reflux Musculoskeletal: no muscle aches/no joint aches Skin: no rashes, no hair loss Neurological: no tremors/+ numbness/+ tingling/no dizziness  I reviewed pt's medications, allergies, PMH, social hx, family hx, and changes were documented in the history of present illness. Otherwise, unchanged from my initial visit note.  Past Medical History:  Diagnosis Date  . Anemia in chronic renal disease 06/05/2011  . Anxiety    takes Ativan tid  . Asthma    has an inhaler prn  . Chronic kidney disease   . Cough   . DDD (degenerative disc disease), cervical 06/05/2011  . DDD (degenerative disc disease), lumbosacral 06/05/2011  . Depression    takes Cymbalta daily  . Diabetes mellitus    takes Amaryl and Metformin daily  . Diabetic neuropathy ([place])   . Diverticulosis   . Dizziness   . GERD (gastroesophageal reflux disease)    takes Protonix prn  . Headache(784.0)    occasionally  . History of bronchitis    last time 18yrs ago  . History of colon polyps   . History of MRSA infection   . HTN (hypertension) 06/05/2011   takes Prinizide daily  . Hyperlipidemia    takes Zocor daily  . Insomnia    not on any meds at present time  . Osteoarthritis   . PONV (postoperative nausea and vomiting)    hard to wake up  . Psoriasis 06/05/2011   on legs and uses cream prn  . Seasonal allergies    but doesn't take any meds  . Sleep apnea    uses CPAP  . Tubular adenoma of colon    Past Surgical History:  Procedure Laterality Date  . ABDOMINAL HYSTERECTOMY  1982 and 1984  . AMPUTATION  02/12/2012   Procedure: AMPUTATION RAY;  Surgeon: Wylene Simmer, MD;   Location: [facility];  Service: Orthopedics;  Laterality: Right;  RIGHT 2ND TOE AMPUTATION   . BACK SURGERY     x 3  . CARDIAC CATHETERIZATION     08/09/04: 50% mid AV groove CX lesion, NL LM, LAD, Ramus, EF > 60% (Dr. Gwenlyn Found)  . CARPAL TUNNEL RELEASE Bilateral 2010  . CERVICAL SPINE SURGERY    . CHOLECYSTECTOMY  1977  . COLONOSCOPY  05/30/2009, 09/30/13   [facility] 2011/Dr.Brodie 2015  . DILATION AND CURETTAGE OF UTERUS    . ELBOW SURGERY     left d/t tendonitis  . ESOPHAGOGASTRODUODENOSCOPY    . LUMBAR WOUND DEBRIDEMENT N/A 07/15/2012   Procedure: I & D of Lumbar Wound: Possible Repair of CSF Leak;  Surgeon: Elaina Hoops, MD;  Location: [facility];  Service: Neurosurgery;  Laterality: N/A;  I &  D of Lumbar Wound: Possible Repair of CSF Leak placement lumbar drain  . POLYPECTOMY    . right knee arthroscopy     x 2  . SHOULDER ARTHROSCOPY Left   . TOE AMPUTATION Left    Social History   Social History  . Marital status: Married    Spouse name: N/A  . Number of children: 2   Occupational History  . Disabled    Social History Main Topics  . Smoking status: Never Smoker  . Smokeless tobacco: Never Used     Comment: never used tobacco  . Alcohol use No  . Drug use: No   Current Outpatient Medications on File Prior to Visit  Medication Sig Dispense Refill  . albuterol (PROVENTIL HFA;VENTOLIN HFA) 108 (90 BASE) MCG/ACT inhaler Inhale 1 puff into the lungs every 4 (four) hours as needed for wheezing or shortness of breath.     Marland Kitchen aspirin 81 MG tablet Take 81 mg by mouth daily.    . BD PEN NEEDLE NANO 2ND GEN 32G X 4 MM MISC USE ONCE EVERYDAY 100 each 3  . ciprofloxacin (CIPRO) 500 MG tablet Take 1 tablet (500 mg total) by mouth 2 (two) times daily. 14 tablet 0  . clobetasol (OLUX) 0.05 % topical foam Apply 1 application topically daily as needed (apply to head for dry scalp).     . DULoxetine (CYMBALTA) 30 MG capsule Take 90 mg by mouth daily.  5  . glipiZIDE (GLUCOTROL) 5 MG tablet TAKE 1  TABLET(5 MG) BY MOUTH TWICE DAILY BEFORE A MEAL 180 tablet 1  . glucose blood (ONETOUCH VERIO) test strip TEST TWICE DAILY 200 strip 11  . insulin degludec (TRESIBA FLEXTOUCH) 200 UNIT/ML FlexTouch Pen Inject 52 units into the skin daily 18 mL 2  . JARDIANCE 25 MG TABS tablet Take half a tablet (12.5 mg) daily before breakfast. 30 tablet 1  . LORazepam (ATIVAN) 1 MG tablet Take 1 mg by mouth daily as needed for anxiety.     . metoprolol succinate (TOPROL-XL) 25 MG 24 hr tablet Take 25 mg by mouth daily.     . metroNIDAZOLE (FLAGYL) 250 MG tablet Take 1 tablet (250 mg total) by mouth 3 (three) times daily. 21 tablet 0  . Multiple Vitamins-Minerals (MULTIVITAMIN WITH MINERALS) tablet Take 1 tablet by mouth daily.     . ondansetron (ZOFRAN) 8 MG tablet Take 8 mg by mouth 3 (three) times daily as needed for nausea or vomiting.     . Probiotic Product (PROBIOTIC DAILY PO) Take 1 capsule by mouth daily.     . silver sulfADIAZINE (SILVADENE) 1 % cream Apply 1 application topically daily. 50 g 0  . simvastatin (ZOCOR) 40 MG tablet Take 40 mg by mouth at bedtime.     . triamcinolone (KENALOG) 0.025 % ointment Apply 1 application topically 2 (two) times daily. 30 g 0   No current facility-administered medications on file prior to visit.   Allergies  Allergen Reactions  . Victoza [Liraglutide] Hives and Rash  . Hydromorphone Hives, Diarrhea, Swelling and Rash  . Ultram [Tramadol] Rash  . Betadine [Povidone Iodine] Other (See Comments)    Skin peels  . Bydureon [Exenatide] Diarrhea and Nausea And Vomiting  . Codeine Other (See Comments)    Severe headaches  . Contrast Media [Iodinated Diagnostic Agents] Nausea And Vomiting  . Humira [Adalimumab] Hives  . Hydrocodone Other (See Comments)    headache  . Iodine Hives  . Januvia [Sitagliptin] Diarrhea  .  Morphine And Related Nausea And Vomiting  . Ozempic (0.25 Or 0.5 Mg-Dose) [Semaglutide(0.25 Or 0.5mg -Dos)] Nausea And Vomiting  . Percocet  [Oxycodone-Acetaminophen] Other (See Comments)    Headache   . Sulfa Antibiotics Itching  . Ultram [Tramadol Hcl] Other (See Comments)    unknown  . Valium Other (See Comments)    headache  . Amoxicillin Hives and Rash    Did it involve swelling of the face/tongue/throat, SOB, or low BP? Unknown Did it involve sudden or severe rash/hives, skin peeling, or any reaction on the inside of your mouth or nose? Yes Did you need to seek medical attention at a hospital or doctor's office? Yes When did it last happen?unk If all above answers are "NO", may proceed with cephalosporin use.   Carlton Adam [Propoxyphene N-Acetaminophen] Rash and Other (See Comments)    headache  . Fentanyl And Related Rash  . Septra [Sulfamethoxazole-Trimethoprim] Rash  . Wellbutrin [Bupropion Hcl] Rash   Family History  Problem Relation Age of Onset  . Colon cancer Brother 46  . Hypertension Mother        died from infection s/p hip surgery  . Heart attack Father   . Colon cancer Maternal Grandfather   . Stomach cancer Neg Hx   . Esophageal cancer Neg Hx   . Rectal cancer Neg Hx   . Liver cancer Neg Hx   . Colon polyps Neg Hx     PE: BP 130/70   Pulse 79   Ht 5\' 4"  (1.626 m)   Wt 225 lb 6.4 oz (102.2 kg)   SpO2 97%   BMI 38.69 kg/m  Wt Readings from Last 3 Encounters:  04/22/20 225 lb 6.4 oz (102.2 kg)  01/20/20 224 lb (101.6 kg)  09/29/19 221 lb 8 oz (100.5 kg)   Constitutional: overweight, in NAD Eyes: PERRLA, EOMI, no exophthalmos ENT: moist mucous membranes, no thyromegaly, no cervical lymphadenopathy Cardiovascular: RRR, No MRG Respiratory: CTA B Gastrointestinal: abdomen soft, NT, ND, BS+ Musculoskeletal: no deformities, strength intact in all 4 Skin: moist, warm, no rashes Neurological: no tremor with outstretched hands, DTR normal in all 4  ASSESSMENT: 1. DM2, insulin-dependent, controlled, with complications: - PN - s/p B 2nd toe amputations 2/2 osteomyelitis - 2013 and  2015; 1/2 hallux 04/2017 - PAD - foot ulcer  2. HL  3.  Obesity class II  PLAN:  1. Patient with longstanding, uncontrolled, type 2 diabetes which initially improved control, but worsening in the last year.  She is on a sulfonylurea, SGLT2 inhibitor, and long-acting insulin, which we increased at last visit.  We tried to use a GLP-1 receptor agonist but she developed a diverticulitis episode and we had to stop.  We retried to use Ozempic after her diverticulitis episode resolved, but she again developed abdominal pain and we had to stop.  At last visit, sugars were above goal at all times of the day so we increased her Tresiba dose.  I advised her to continue to increase the dose, if needed, to 70-80 units.  We also discussed at length about improving her diet and I suggested a more plant-based diet, to which she was open.  Discussed about the advantages of using his diet to decrease her insulin resistance and allow better control of her diabetes.  At last visit, kidney function was lower, but we continued the half maximal dose of Jardiance.  We did discuss that she may need to see nephrology in the near future. -At today's visit, we reviewed  her blood sugars at home and they have been at the upper limit of normal or higher in the morning, up to 200s and slightly better controlled later in the day, but with still quite a few values in the 200s. There is no particular pattern in her blood sugars. From reviewing her log, I would have expected her HbA1c to be around 8-8.4%. However,  HbA1c returned 9.4% (higher). We checked her blood sugar in the office with our department her and this actually returned quite high, is 321 despite only having coffee and crackers this morning. In this case, I suggested changing glucometer. We gave her a One Touch Verio reflex meter. -Based on the elevated blood sugars, I suggested to add mealtime insulin. We will start at a lower dose, 7 units and increase to 10 units if  needed with certain meals for now. I advised her how to adjust the dose based on the size of her meals and I also advised him to take the NovoLog 15 minutes before each meal. We will continue the rest of her regimen for now. - I suggested to:  Patient Instructions  Please continue: - Jardiance 12.5 mg daily before b'fast - Glipizide 5 mg 2x a day before meals - Tresiba 52 units at bedtime   Please start: - Novolog 7-10 units 15 min before a meal 3x a day  Try to get a new meter.  Please come back for a follow-up appointment in 1.5 months.  - advised to check sugars at different times of the day - 3x a day, rotating check times - advised for yearly eye exams >> she is UTD - return to clinic in 3 months  2. HL -Reviewed latest lipid panel from 12/31/2019  - lipid fractions were all at goal 102/147/33/43 -Continue Zocor 40 without side effects.  3.  Obesity class II -will continue SGLT 2 inhibitor which should also help with weight loss -She could not tolerate Ozempic due to diverticulitis and abdominal pain -She gained approximately 12 pounds in the 7 months prior to our last visit. Weight ~stable since last OV.  Philemon Kingdom, MD PhD [facility]

## 2020-04-22 NOTE — Patient Instructions (Addendum)
Please continue: - Jardiance 12.5 mg daily before b'fast - Glipizide 5 mg 2x a day before meals - Tresiba 52 units at bedtime   Please start: - Novolog 7-10 units 15 min before a meal 3x a day  Try to get a new meter.  Please come back for a follow-up appointment in 1.5 months.

## 2020-04-22 NOTE — Addendum Note (Signed)
Addended by: Lauralyn Primes on: 04/22/2020 03:17 PM   Modules accepted: Orders

## 2020-04-25 ENCOUNTER — Encounter: Payer: Self-pay | Admitting: Internal Medicine

## 2020-05-02 ENCOUNTER — Encounter: Payer: Self-pay | Admitting: Internal Medicine

## 2020-05-02 DIAGNOSIS — IMO0002 Reserved for concepts with insufficient information to code with codable children: Secondary | ICD-10-CM

## 2020-05-02 DIAGNOSIS — E1142 Type 2 diabetes mellitus with diabetic polyneuropathy: Secondary | ICD-10-CM

## 2020-05-02 MED ORDER — NOVOFINE PLUS PEN NEEDLE 32G X 4 MM MISC: 3 refills | Status: DC

## 2020-05-04 ENCOUNTER — Encounter: Payer: Self-pay | Admitting: Internal Medicine

## 2020-05-04 ENCOUNTER — Other Ambulatory Visit: Payer: Self-pay | Admitting: *Deleted

## 2020-05-04 MED ORDER — "INSULIN SYRINGE 31G X 5/16"" 1 ML MISC": 5 refills | Status: DC

## 2020-05-09 DIAGNOSIS — G4733 Obstructive sleep apnea (adult) (pediatric): Secondary | ICD-10-CM | POA: Diagnosis not present

## 2020-05-18 DIAGNOSIS — G4733 Obstructive sleep apnea (adult) (pediatric): Secondary | ICD-10-CM | POA: Diagnosis not present

## 2020-05-20 DIAGNOSIS — L409 Psoriasis, unspecified: Secondary | ICD-10-CM | POA: Diagnosis not present

## 2020-05-20 DIAGNOSIS — F322 Major depressive disorder, single episode, severe without psychotic features: Secondary | ICD-10-CM | POA: Diagnosis not present

## 2020-05-20 DIAGNOSIS — G4733 Obstructive sleep apnea (adult) (pediatric): Secondary | ICD-10-CM | POA: Diagnosis not present

## 2020-05-20 DIAGNOSIS — Z89421 Acquired absence of other right toe(s): Secondary | ICD-10-CM | POA: Diagnosis not present

## 2020-05-20 DIAGNOSIS — Z0000 Encounter for general adult medical examination without abnormal findings: Secondary | ICD-10-CM | POA: Diagnosis not present

## 2020-05-20 DIAGNOSIS — N183 Chronic kidney disease, stage 3 unspecified: Secondary | ICD-10-CM | POA: Diagnosis not present

## 2020-05-20 DIAGNOSIS — E1129 Type 2 diabetes mellitus with other diabetic kidney complication: Secondary | ICD-10-CM | POA: Diagnosis not present

## 2020-05-20 DIAGNOSIS — E782 Mixed hyperlipidemia: Secondary | ICD-10-CM | POA: Diagnosis not present

## 2020-05-20 DIAGNOSIS — G2581 Restless legs syndrome: Secondary | ICD-10-CM | POA: Diagnosis not present

## 2020-05-20 DIAGNOSIS — I129 Hypertensive chronic kidney disease with stage 1 through stage 4 chronic kidney disease, or unspecified chronic kidney disease: Secondary | ICD-10-CM | POA: Diagnosis not present

## 2020-05-20 DIAGNOSIS — R102 Pelvic and perineal pain: Secondary | ICD-10-CM | POA: Diagnosis not present

## 2020-05-26 ENCOUNTER — Encounter: Payer: Self-pay | Admitting: Internal Medicine

## 2020-05-26 NOTE — Progress Notes (Signed)
Received labs from PCP drawn on 05/25/2020: CMP normal except: Glu 129, BUNs/creatinine 44/1.29, GFR 41, otherwise normal Lipids: 106/204/33/40

## 2020-06-07 ENCOUNTER — Ambulatory Visit: Payer: PPO | Admitting: Internal Medicine

## 2020-06-07 DIAGNOSIS — N9489 Other specified conditions associated with female genital organs and menstrual cycle: Secondary | ICD-10-CM | POA: Diagnosis not present

## 2020-06-07 DIAGNOSIS — R300 Dysuria: Secondary | ICD-10-CM | POA: Diagnosis not present

## 2020-06-09 DIAGNOSIS — G4733 Obstructive sleep apnea (adult) (pediatric): Secondary | ICD-10-CM | POA: Diagnosis not present

## 2020-06-09 DIAGNOSIS — N3000 Acute cystitis without hematuria: Secondary | ICD-10-CM | POA: Diagnosis not present

## 2020-06-09 DIAGNOSIS — R300 Dysuria: Secondary | ICD-10-CM | POA: Diagnosis not present

## 2020-06-16 ENCOUNTER — Encounter: Payer: Self-pay | Admitting: Internal Medicine

## 2020-06-16 DIAGNOSIS — E1165 Type 2 diabetes mellitus with hyperglycemia: Secondary | ICD-10-CM

## 2020-06-16 DIAGNOSIS — IMO0002 Reserved for concepts with insufficient information to code with codable children: Secondary | ICD-10-CM

## 2020-06-16 DIAGNOSIS — E1142 Type 2 diabetes mellitus with diabetic polyneuropathy: Secondary | ICD-10-CM

## 2020-06-16 NOTE — Telephone Encounter (Signed)
Pt's insurance covers [place]. Ok to switch?

## 2020-06-17 ENCOUNTER — Telehealth: Payer: Self-pay | Admitting: Internal Medicine

## 2020-06-17 MED ORDER — CANAGLIFLOZIN 300 MG PO TABS: 300.0000 mg | ORAL_TABLET | Freq: Every day | ORAL | 3 refills | Status: DC

## 2020-06-17 NOTE — Telephone Encounter (Signed)
Pt's insurance only covers Invokana 300 mg

## 2020-06-17 NOTE — Telephone Encounter (Signed)
[facility] called in regards to the prescription sent in today for Invokana. The prescription had 2 conflicting directions and they need a nurse to give them a call to verify which directions are correct.  Ph# [phone] ask for [facility]

## 2020-06-17 NOTE — Telephone Encounter (Signed)
Called and spoke with pharmacy to correct patient directions on Invokana Rx

## 2020-06-30 ENCOUNTER — Encounter: Payer: Self-pay | Admitting: Internal Medicine

## 2020-06-30 ENCOUNTER — Ambulatory Visit (INDEPENDENT_AMBULATORY_CARE_PROVIDER_SITE_OTHER): Payer: HMO | Admitting: Internal Medicine

## 2020-06-30 ENCOUNTER — Other Ambulatory Visit: Payer: Self-pay

## 2020-06-30 VITALS — BP 120/80 | HR 61 | Ht 64.0 in | Wt 226.4 lb

## 2020-06-30 DIAGNOSIS — E669 Obesity, unspecified: Secondary | ICD-10-CM | POA: Diagnosis not present

## 2020-06-30 DIAGNOSIS — E1165 Type 2 diabetes mellitus with hyperglycemia: Secondary | ICD-10-CM

## 2020-06-30 DIAGNOSIS — IMO0002 Reserved for concepts with insufficient information to code with codable children: Secondary | ICD-10-CM

## 2020-06-30 DIAGNOSIS — E785 Hyperlipidemia, unspecified: Secondary | ICD-10-CM

## 2020-06-30 DIAGNOSIS — E1142 Type 2 diabetes mellitus with diabetic polyneuropathy: Secondary | ICD-10-CM

## 2020-06-30 LAB — POCT GLYCOSYLATED HEMOGLOBIN (HGB A1C): Hemoglobin A1C: 8.1 % — AB (ref 4.0–5.6)

## 2020-06-30 MED ORDER — DAPAGLIFLOZIN PROPANEDIOL 5 MG PO TABS: 5.0000 mg | ORAL_TABLET | Freq: Every day | ORAL | 3 refills | Status: DC

## 2020-06-30 NOTE — Addendum Note (Signed)
Addended by: Lauralyn Primes on: 06/30/2020 01:55 PM   Modules accepted: Orders

## 2020-06-30 NOTE — Progress Notes (Signed)
Patient ID: [name], female   DOB: [birth year], 70 y.o.   MRN: [id number]   This visit occurred during the SARS-CoV-2 public health emergency.  Safety protocols were in place, including screening questions prior to the visit, additional usage of staff PPE, and extensive cleaning of exam room while observing appropriate contact time as indicated for disinfecting solutions.   HPI: [name] is a 70 y.o.-year-old female, initially referred by her PCP, Dr. Harley Alto for f/u for DM2, dx in 1980s, insulin-dependent, controlled, with complications (PN - s/p B digit amputations 2/2 osteomyelitis, PAD, foot ulcer). She saw Dr. Elyse Hsu previously. Last visit with me 2 months ago  Reviewed her HbA1c levels Lab Results  Component Value Date   HGBA1C 9.4 (A) 04/22/2020   HGBA1C 9.1 (A) 01/20/2020   HGBA1C 10.3 (A) 09/29/2019  02/16/2016: 7.1% 06/2015: 6.6%  Pt is on a regimen of: - Jardiance 12.5 mg daily before b'fast - but need a PA to continue treatment - Glipizide 2.5 mg before b'fast - added back 09/2018 >> 5 mg 2x a day before meals - Tresiba 10 >> 24 >> 28 >> 44 >> Toujeo >> Tresiba 20 >> 42 >> 50 >> 52 units daily  (felt poorly at 54 units daily) - Novolog 7 to 10 units before each meal She stopped Ozempic 04/2019 due to GI symptoms.  We retried it in 09/2019 with same symptoms so he had to stop. She stopped Metformin ER >> AP and diarrhea She was on Victoza >> rash. In 09/2016, she had a steroid inj in back >> sugars 200s >> used Novolog for 3 days.  She initially refused insulin.  Pt checks her sugars once a day: - am:134, 146-228 >> 168-238, 303 >> 129-206 >> 98-169 (higher 2/2 UTI) - 2h after b'fast: 151-185, 436 >> 160-222, 250 >> 129-191, 231 >> 117 - before lunch: 150-218, 368 >> 178-230 >> 145-201, 231 >> 130-150, 200 - 2h after lunch: 150 >> 160-177 >> 200-260 >> 194, 220 >> 135-150 - before dinner: 150-231 >> 183-237, 274 >> 150-269 >> 135 - 2h after dinner:  160, 170, 319 >> 200-217 >> 150, 216 >> n/c - bedtime:  272 >> 180 >> n/c >> 259 >> n/c >> 145 - nighttime: n/c Lowest sugar was 99 >> 168 >> 98; she has hypoglycemia awareness in the 90s. Highest sugar was 436 >> 303 >> 331 (sick) >> 200.  Glucometer: One Probation officer - at last visit I advised her to get the new meter  Pt's meals are: - Breakfast: [place] bacon, 2 eggs, toast; oatmeal; or brown rice - Lunch: Tuna, crackers or sandwich, chicken strips, chips, diet Pepsi - Dinner: Meat, veggies, starch  - Snacks: 2-3: 15g carbs (pretzels, chips)  She walks for exercise or rides her stationary bike.  -+ CKD, last BUN/creatinine:  05/25/2020: CMP normal except: Glu 129, BUNs/creatinine 44/1.29, GFR 41, otherwise normal 12/31/2019: 42/1.37, GFR 38, glucose 193, otherwise normal CMP Lab Results  Component Value Date   BUN 33 (H) 05/11/2019   BUN 72 (H) 05/09/2019   CREATININE 1.27 (H) 05/11/2019   CREATININE 2.01 (H) 05/09/2019  03/21/2018: CMP normal with the exception of high glucose at 143, BUN/creatinine 37/1.26, GFR 43 (kidney function improved from 09/2017) Urinalysis with 1+ protein 09/13/2017: Glucose 123, BUN 44/creatinine 1.34, GFR 40, previously 44 03/06/2017: ACR 222.8 08/29/2016: ACR 76.7 02/16/2016: 42/1.34 (GFR 40), Glu 213  12/26/2015: 25/1.23 (GFR 44), Glu 153 Off lisinopril.  -+ HL; last set of  lipids: 05/25/2020: 106/204/33/40 12/31/2019: 102/147/33/43 Lab Results  Component Value Date   CHOL 110 02/04/2019   HDL 31.60 (L) 02/04/2019   LDLDIRECT 44.0 02/04/2019   TRIG 255.0 (H) 02/04/2019   CHOLHDL 3 02/04/2019  03/21/2018: 92/202/31/21 09/04/2017: 126/269/33/39 08/29/2016: 87/132/32/30 02/16/2016: 128/278/33/39  03/02/2014: 114/175/32/48 On Zocor 40.  - last eye exam was 12/2019: No DR (Dr. Gershon Crane).  She has a history of cataract surgery.  -She has numbness and tingling in her feet.  She sees podiatry -Dr. Jacqualyn Posey.  She also has a history of HTN, GERD,  anemia. She had amputation of 1/2 of the left hallux 05/06/2017.  This is now healed. She started Humira (for psoriasis) but did not tolerate this >> stopped. She started PT for her back pain. Also dry needling.   ROS: Constitutional: no weight gain/no weight loss, no fatigue, no subjective hyperthermia, no subjective hypothermia Eyes: no blurry vision, no xerophthalmia ENT: no sore throat, no nodules palpated in neck, no dysphagia, no odynophagia, no hoarseness Cardiovascular: no CP/no SOB/no palpitations/no leg swelling Respiratory: no cough/no SOB/no wheezing Gastrointestinal: no N/no V/no D/no C/no acid reflux Musculoskeletal: no muscle aches/no joint aches Skin: no rashes, no hair loss Neurological: no tremors/+ numbness/+ tingling/no dizziness  I reviewed pt's medications, allergies, PMH, social hx, family hx, and changes were documented in the history of present illness. Otherwise, unchanged from my initial visit note.  Past Medical History:  Diagnosis Date  . Anemia in chronic renal disease 06/05/2011  . Anxiety    takes Ativan tid  . Asthma    has an inhaler prn  . Chronic kidney disease   . Cough   . DDD (degenerative disc disease), cervical 06/05/2011  . DDD (degenerative disc disease), lumbosacral 06/05/2011  . Depression    takes Cymbalta daily  . Diabetes mellitus    takes Amaryl and Metformin daily  . Diabetic neuropathy ([place])   . Diverticulosis   . Dizziness   . GERD (gastroesophageal reflux disease)    takes Protonix prn  . Headache(784.0)    occasionally  . History of bronchitis    last time 4yrs ago  . History of colon polyps   . History of MRSA infection   . HTN (hypertension) 06/05/2011   takes Prinizide daily  . Hyperlipidemia    takes Zocor daily  . Insomnia    not on any meds at present time  . Osteoarthritis   . PONV (postoperative nausea and vomiting)    hard to wake up  . Psoriasis 06/05/2011   on legs and uses cream prn  . Seasonal  allergies    but doesn't take any meds  . Sleep apnea    uses CPAP  . Tubular adenoma of colon    Past Surgical History:  Procedure Laterality Date  . ABDOMINAL HYSTERECTOMY  1982 and 1984  . AMPUTATION  02/12/2012   Procedure: AMPUTATION RAY;  Surgeon: Wylene Simmer, MD;  Location: [facility];  Service: Orthopedics;  Laterality: Right;  RIGHT 2ND TOE AMPUTATION   . BACK SURGERY     x 3  . CARDIAC CATHETERIZATION     08/09/04: 50% mid AV groove CX lesion, NL LM, LAD, Ramus, EF > 60% (Dr. Gwenlyn Found)  . CARPAL TUNNEL RELEASE Bilateral 2010  . CERVICAL SPINE SURGERY    . CHOLECYSTECTOMY  1977  . COLONOSCOPY  05/30/2009, 09/30/13   [facility] 2011/Dr.Brodie 2015  . DILATION AND CURETTAGE OF UTERUS    . ELBOW SURGERY     left  d/t tendonitis  . ESOPHAGOGASTRODUODENOSCOPY    . LUMBAR WOUND DEBRIDEMENT N/A 07/15/2012   Procedure: I & D of Lumbar Wound: Possible Repair of CSF Leak;  Surgeon: Elaina Hoops, MD;  Location: [facility];  Service: Neurosurgery;  Laterality: N/A;  I & D of Lumbar Wound: Possible Repair of CSF Leak placement lumbar drain  . POLYPECTOMY    . right knee arthroscopy     x 2  . SHOULDER ARTHROSCOPY Left   . TOE AMPUTATION Left    Social History   Social History  . Marital status: Married    Spouse name: N/A  . Number of children: 2   Occupational History  . Disabled    Social History Main Topics  . Smoking status: Never Smoker  . Smokeless tobacco: Never Used     Comment: never used tobacco  . Alcohol use No  . Drug use: No   Current Outpatient Medications on File Prior to Visit  Medication Sig Dispense Refill  . albuterol (PROVENTIL HFA;VENTOLIN HFA) 108 (90 BASE) MCG/ACT inhaler Inhale 1 puff into the lungs every 4 (four) hours as needed for wheezing or shortness of breath.     Marland Kitchen aspirin 81 MG tablet Take 81 mg by mouth daily.    . canagliflozin (INVOKANA) 300 MG TABS tablet Take 1 tablet (300 mg total) by mouth daily before breakfast. Take 0.5 tablet (150 mg total)  by mouth before breakfast 45 tablet 3  . clobetasol (OLUX) 0.05 % topical foam Apply 1 application topically daily as needed (apply to head for dry scalp).     . DULoxetine (CYMBALTA) 30 MG capsule Take 90 mg by mouth daily.  5  . glipiZIDE (GLUCOTROL) 5 MG tablet TAKE 1 TABLET(5 MG) BY MOUTH TWICE DAILY BEFORE A MEAL 180 tablet 1  . glucose blood (ONETOUCH VERIO) test strip TEST TWICE DAILY 200 strip 11  . insulin aspart (NOVOLOG) 100 UNIT/ML injection Inject 7-10 Units into the skin 3 (three) times daily before meals. 30 mL 3  . insulin degludec (TRESIBA FLEXTOUCH) 200 UNIT/ML FlexTouch Pen Inject 52 units into the skin daily 18 mL 2  . Insulin Pen Needle 31G X 5 MM MISC by Does not apply route.    . Insulin Pen Needle 32G X 5 MM MISC by Does not apply route.    . Insulin Syringe-Needle U-100 (INSULIN SYRINGE 1CC/31GX5/16") 31G X 5/16" 1 ML MISC Use 4 syringes per day to inject insulin 120 each 5  . LORazepam (ATIVAN) 1 MG tablet Take 1 mg by mouth daily as needed for anxiety.     . metoprolol succinate (TOPROL-XL) 25 MG 24 hr tablet Take 25 mg by mouth daily. Taking 2 tablets daily    . Multiple Vitamins-Minerals (MULTIVITAMIN WITH MINERALS) tablet Take 1 tablet by mouth daily.     . Needle, Disp, 32G X 5/16" MISC by Does not apply route.    . Probiotic Product (PROBIOTIC DAILY PO) Take 1 capsule by mouth daily.     . simvastatin (ZOCOR) 40 MG tablet Take 40 mg by mouth at bedtime.     . triamcinolone (KENALOG) 0.025 % ointment Apply 1 application topically 2 (two) times daily. 30 g 0   No current facility-administered medications on file prior to visit.   Allergies  Allergen Reactions  . Victoza [Liraglutide] Hives and Rash  . Hydromorphone Hives, Diarrhea, Swelling, Rash and Other (See Comments)  . Ultram [Tramadol] Rash  . Betadine [Povidone Iodine] Other (See Comments)  Skin peels  . Bydureon [Exenatide] Diarrhea and Nausea And Vomiting  . Codeine Other (See Comments)    Severe  headaches  . Contrast Media [Iodinated Diagnostic Agents] Nausea And Vomiting  . Humira [Adalimumab] Hives  . Hydrocodone Other (See Comments)    headache  . Iodine Hives  . Januvia [Sitagliptin] Diarrhea  . Morphine And Related Nausea And Vomiting  . Ozempic (0.25 Or 0.5 Mg-Dose) [Semaglutide(0.25 Or 0.5mg -Dos)] Nausea And Vomiting  . Percocet [Oxycodone-Acetaminophen] Other (See Comments)    Headache   . Sulfa Antibiotics Itching  . Ultram [Tramadol Hcl] Other (See Comments)    unknown  . Valium Other (See Comments)    headache  . Amoxicillin Hives and Rash    Did it involve swelling of the face/tongue/throat, SOB, or low BP? Unknown Did it involve sudden or severe rash/hives, skin peeling, or any reaction on the inside of your mouth or nose? Yes Did you need to seek medical attention at a hospital or doctor's office? Yes When did it last happen?unk If all above answers are "NO", may proceed with cephalosporin use.   Carlton Adam [Propoxyphene N-Acetaminophen] Rash and Other (See Comments)    headache  . Fentanyl And Related Rash  . Septra [Sulfamethoxazole-Trimethoprim] Rash  . Wellbutrin [Bupropion Hcl] Rash   Family History  Problem Relation Age of Onset  . Colon cancer Brother 24  . Hypertension Mother        died from infection s/p hip surgery  . Heart attack Father   . Colon cancer Maternal Grandfather   . Stomach cancer Neg Hx   . Esophageal cancer Neg Hx   . Rectal cancer Neg Hx   . Liver cancer Neg Hx   . Colon polyps Neg Hx     PE: BP 120/80   Pulse 61   Ht 5\' 4"  (1.626 m)   Wt 226 lb 6.4 oz (102.7 kg)   SpO2 98%   BMI 38.86 kg/m  Wt Readings from Last 3 Encounters:  06/30/20 226 lb 6.4 oz (102.7 kg)  04/22/20 225 lb 6.4 oz (102.2 kg)  01/20/20 224 lb (101.6 kg)   Constitutional: overweight, in NAD Eyes: PERRLA, EOMI, no exophthalmos ENT: moist mucous membranes, no thyromegaly, no cervical lymphadenopathy Cardiovascular: RRR, No  MRG Respiratory: CTA B Gastrointestinal: abdomen soft, NT, ND, BS+ Musculoskeletal: no deformities, strength intact in all 4 Skin: moist, warm, no rashes Neurological: no tremor with outstretched hands, DTR normal in all 4  ASSESSMENT: 1. DM2, insulin-dependent, controlled, with complications: - PN - s/p B 2nd toe amputations 2/2 osteomyelitis - 2013 and 2015; 1/2 hallux 04/2017 - PAD - foot ulcer  2. HL  3.  Obesity class II  PLAN:  1. Patient with longstanding, uncontrolled, type 2 diabetes, on sulfonylurea, SGLT2 inhibitor, long-acting insulin, to which we added rapid-acting insulin at last visit.  We tried to use a GLP-1 receptor agonist but she developed a diverticulitis episode and we had to stop.  We retried Ozempic after her diverticulitis episode resolved, but she again developed abdominal pain and we had to stop.  As sugars were still high at last visit (HbA1c was higher, at 9.4%), we added mealtime insulin.  We are having her on maximal dose of Jardiance due to decreased kidney function, however, at this visit, she tells me that Vania Rea will need a preauthorization from her insurance.  For now, she still has some tablets from her husband, but this will last for less than 2 months. -At this  visit, I sent a prescription for Wilder Glade to her pharmacy.  If this is not covered, we will proceed with a preauthorization for Jardiance.  Of note, she cannot use Invokana due to PAD and previous toe amputations -At today's visit, sugars have improved significantly since last visit.  She had a period of 2 weeks when they were again elevated, but much less than before and they improved after the infection resolved.  In the last 2 weeks, her sugars have almost all been at goal. -For now, I advised her to vary the dose of NovoLog more before meals, but otherwise, I would not recommend to change in regimen. - I suggested to:  Patient Instructions  Please continue: - Jardiance 12.5 mg daily before  b'fast (can try to change to Farxiga 5 mg daily) - Glipizide 5 mg 2x a day before meals - Tresiba 52 units at bedtime   Change:  - Novolog 8-12 units 15 min before a meal 3x a day  Please come back for a follow-up appointment in 2-3 months.  - we checked her HbA1c: 8.1% (better) - advised to check sugars at different times of the day - 4x a day, rotating check times - advised for yearly eye exams >> she is UTD - return to clinic in 2-3 months  2. HL -Reviewed latest lipid panel from 12/31/2019-lipid fractions at goal, however, she had another lipid panel on 05/25/2020: 106/204/33/40 -LDL at goal but triglycerides high -Continues Zocor 40 without side effects.  3.  Obesity class II -We will continue the SGLT2 inhibitor which should also help with weight loss -Unfortunately, she could not tolerate Ozempic due to diverticulitis and abdominal pain.  This would have helped with weight loss -At last visit, weight was stable, since last visit, she gained 1 pound  Philemon Kingdom, MD PhD [facility]

## 2020-06-30 NOTE — Patient Instructions (Signed)
Please continue: - Jardiance 12.5 mg daily before b'fast (can try to change to Farxiga 5 mg daily) - Glipizide 5 mg 2x a day before meals - Tresiba 52 units at bedtime   Change:  - Novolog 8-12 units 15 min before a meal 3x a day  Please come back for a follow-up appointment in 2-3 months.

## 2020-07-07 ENCOUNTER — Encounter: Payer: Self-pay | Admitting: Internal Medicine

## 2020-07-07 DIAGNOSIS — G4733 Obstructive sleep apnea (adult) (pediatric): Secondary | ICD-10-CM | POA: Diagnosis not present

## 2020-07-26 ENCOUNTER — Other Ambulatory Visit: Payer: Self-pay

## 2020-07-26 DIAGNOSIS — IMO0002 Reserved for concepts with insufficient information to code with codable children: Secondary | ICD-10-CM

## 2020-07-26 DIAGNOSIS — E1142 Type 2 diabetes mellitus with diabetic polyneuropathy: Secondary | ICD-10-CM

## 2020-07-26 MED ORDER — EMPAGLIFLOZIN 10 MG PO TABS: 10.0000 mg | ORAL_TABLET | Freq: Every day | ORAL | 1 refills | Status: DC

## 2020-07-26 NOTE — Telephone Encounter (Signed)
Notification received from [place] is not on the list of covered drugs for this pt's plan. Dr Cruzita Lederer requested a switch to Jardiance 10 mg. Rx sent to preferred pharmacy.

## 2020-07-30 DIAGNOSIS — M47812 Spondylosis without myelopathy or radiculopathy, cervical region: Secondary | ICD-10-CM | POA: Diagnosis not present

## 2020-07-30 DIAGNOSIS — M25512 Pain in left shoulder: Secondary | ICD-10-CM | POA: Diagnosis not present

## 2020-08-07 DIAGNOSIS — G4733 Obstructive sleep apnea (adult) (pediatric): Secondary | ICD-10-CM | POA: Diagnosis not present

## 2020-08-08 ENCOUNTER — Other Ambulatory Visit: Payer: Self-pay

## 2020-08-08 ENCOUNTER — Ambulatory Visit (INDEPENDENT_AMBULATORY_CARE_PROVIDER_SITE_OTHER): Payer: HMO | Admitting: Podiatry

## 2020-08-08 ENCOUNTER — Encounter: Payer: Self-pay | Admitting: Podiatry

## 2020-08-08 DIAGNOSIS — B351 Tinea unguium: Secondary | ICD-10-CM

## 2020-08-08 DIAGNOSIS — M79674 Pain in right toe(s): Secondary | ICD-10-CM | POA: Diagnosis not present

## 2020-08-08 DIAGNOSIS — L84 Corns and callosities: Secondary | ICD-10-CM

## 2020-08-08 DIAGNOSIS — E1149 Type 2 diabetes mellitus with other diabetic neurological complication: Secondary | ICD-10-CM

## 2020-08-08 DIAGNOSIS — M79675 Pain in left toe(s): Secondary | ICD-10-CM | POA: Diagnosis not present

## 2020-08-08 NOTE — Progress Notes (Signed)
  Subjective:  Patient ID: [name], female    DOB: 10-Mar-1951,  MRN: [id number]  Chief Complaint  Patient presents with  . Nail Problem      3rd toenail pain;left-diabetic    70 y.o. female presents with the above complaint. History confirmed with patient.  The nails are thickened and elongated and she is unable to cut them.  She has a history of previous palpitations on both feet.  Last A1c was 8.1%.  Is down from 9.  Her daily blood sugars have been around 300 the past few months and is starting to come down.  Objective:  Physical Exam: warm, good capillary refill, no trophic changes or ulcerative lesions and normal DP and PT pulses.  Absent protective sensation.  She has hyperkeratotic lesions on the left third, right second and third toe tips and the medial first MTPJ on the right foot.  She has onychomycosis with thickening of nail plates with elongated yellow discolored nails with subungual debris x7.  Previous left hallux and bilateral second toe amputations Assessment:   1. Dermatophytosis of nail   2. Type II diabetes mellitus with neurological manifestations (HCC)   3. Pain in toes of both feet   4. Callus      Plan:  Patient was evaluated and treated and all questions answered.  Patient educated on diabetes. Discussed proper diabetic foot care and discussed risks and complications of disease. Educated patient in depth on reasons to return to the office immediately should he/she discover anything concerning or new on the feet. All questions answered. Discussed proper shoes as well.   All symptomatic hyperkeratoses were safely debrided with a sterile #15 blade to patient's level of comfort without incident. We discussed preventative and palliative care of these lesions including supportive and accommodative shoegear, padding, prefabricated and custom molded accommodative orthoses, use of a pumice stone and lotions/creams daily.  Discussed the etiology and treatment  options for the condition in detail with the patient. Educated patient on the topical and oral treatment options for mycotic nails. Recommended debridement of the nails today. Sharp and mechanical debridement performed of all painful and mycotic nails today. Nails debrided in length and thickness using a nail nipper to level of comfort. Discussed treatment options including appropriate shoe gear. Follow up as needed for painful nails.    Return in about 3 months (around 11/07/2020) for at risk diabetic foot care.

## 2020-08-09 ENCOUNTER — Encounter: Payer: Self-pay | Admitting: Internal Medicine

## 2020-08-09 DIAGNOSIS — IMO0002 Reserved for concepts with insufficient information to code with codable children: Secondary | ICD-10-CM

## 2020-08-09 DIAGNOSIS — E1165 Type 2 diabetes mellitus with hyperglycemia: Secondary | ICD-10-CM

## 2020-08-09 DIAGNOSIS — E1142 Type 2 diabetes mellitus with diabetic polyneuropathy: Secondary | ICD-10-CM

## 2020-08-10 MED ORDER — GLIPIZIDE 5 MG PO TABS: ORAL_TABLET | ORAL | 1 refills | Status: DC

## 2020-09-02 DIAGNOSIS — M25512 Pain in left shoulder: Secondary | ICD-10-CM | POA: Diagnosis not present

## 2020-09-06 DIAGNOSIS — G4733 Obstructive sleep apnea (adult) (pediatric): Secondary | ICD-10-CM | POA: Diagnosis not present

## 2020-09-12 ENCOUNTER — Encounter: Payer: Self-pay | Admitting: Internal Medicine

## 2020-09-30 ENCOUNTER — Encounter: Payer: Self-pay | Admitting: Internal Medicine

## 2020-09-30 ENCOUNTER — Other Ambulatory Visit: Payer: Self-pay

## 2020-09-30 ENCOUNTER — Ambulatory Visit (INDEPENDENT_AMBULATORY_CARE_PROVIDER_SITE_OTHER): Payer: HMO | Admitting: Internal Medicine

## 2020-09-30 VITALS — BP 154/64 | HR 69 | Ht 64.0 in | Wt 230.8 lb

## 2020-09-30 DIAGNOSIS — E1142 Type 2 diabetes mellitus with diabetic polyneuropathy: Secondary | ICD-10-CM

## 2020-09-30 DIAGNOSIS — E1165 Type 2 diabetes mellitus with hyperglycemia: Secondary | ICD-10-CM | POA: Diagnosis not present

## 2020-09-30 DIAGNOSIS — IMO0002 Reserved for concepts with insufficient information to code with codable children: Secondary | ICD-10-CM

## 2020-09-30 DIAGNOSIS — E669 Obesity, unspecified: Secondary | ICD-10-CM

## 2020-09-30 DIAGNOSIS — E785 Hyperlipidemia, unspecified: Secondary | ICD-10-CM

## 2020-09-30 LAB — POCT GLYCOSYLATED HEMOGLOBIN (HGB A1C): Hemoglobin A1C: 8.4 % — AB (ref 4.0–5.6)

## 2020-09-30 MED ORDER — EMPAGLIFLOZIN 25 MG PO TABS: 25.0000 mg | ORAL_TABLET | Freq: Every day | ORAL | 3 refills | Status: DC

## 2020-09-30 NOTE — Patient Instructions (Addendum)
Please increase: - Jardiance 25 mg daily before b'fast  Continue: - Glipizide 5 mg 2x a day before meals - Tresiba 52 units at bedtime  - Novolog 8-12 units 15 min before a meal 3x a day  Please come back for a follow-up appointment in 3 months.

## 2020-09-30 NOTE — Progress Notes (Addendum)
Patient ID: [name], female   DOB: 1950/07/16, 70 y.o.   MRN: [id number]   This visit occurred during the SARS-CoV-2 public health emergency.  Safety protocols were in place, including screening questions prior to the visit, additional usage of staff PPE, and extensive cleaning of exam room while observing appropriate contact time as indicated for disinfecting solutions.   HPI: [name] is a 70 y.o.-year-old female, initially referred by her PCP, Dr. Harley Alto for f/u for DM2, dx in 1980s, insulin-dependent, controlled, with complications (PN - s/p B digit amputations 2/2 osteomyelitis, PAD, foot ulcer). She saw Dr. Elyse Hsu previously. Last visit with me 3 months ago.  Interim history: She has severe back pain - radiating to legs. She had steroid inj in shoulder 6 weeks ago. Sugars higher then (260). No increased urination, blurry vision, nausea. She also feels she gained weight.  Reviewed her HbA1c levels Lab Results  Component Value Date   HGBA1C 8.1 (A) 06/30/2020   HGBA1C 9.4 (A) 04/22/2020   HGBA1C 9.1 (A) 01/20/2020  02/16/2016: 7.1% 06/2015: 6.6%  Pt is on a regimen of: - Jardiance 12.5 >> 10 mg daily before b'fast - but needed a PA to continue treatment  - Glipizide 2.5 mg before b'fast - added back 09/2018 >> 5 mg 2x a day before meals - Toujeo >> Tresiba 20 >> ... 52 units daily  (felt poorly at 54 units daily) - Novolog 7 to 10 units before each meal She stopped Ozempic 04/2019 due to GI symptoms.  We retried it in 09/2019 with same symptoms so he had to stop. She stopped Metformin ER >> AP and diarrhea She was on Victoza >> rash. In 09/2016, she had a steroid inj in back >> sugars 200s >> used Novolog for 3 days.  She initially refused insulin.  Pt checks her sugars once a day: - am: 129-206 >> 98-169 (higher 2/2 UTI) >> 87, 110-151, 157, 199 - 2h after b'fast: 160-222, 250 >> 129-191, 231 >> 117  >> 120-144 - before lunch: 145-201, 231 >> 130-150,  200 >> 125-140, 157 - 2h after lunch: 200-260 >> 194, 220 >> 135-150 >> 75, 121-145, 155 - before dinner: 183-237, 274 >> 150-269 >> 135 >> 140-190 - 2h after dinner: 160, 170, 319 >> 200-217 >> 150, 216 >> n/c >> 123 - bedtime:  272 >> 180 >> n/c >> 259 >> n/c >> 145 >> n/c - nighttime: n/c Lowest sugar was 98 >> 75; she has hypoglycemia awareness in the 90s. Highest sugar was 436 ...>> 260 (steroid inj).  Glucometer: One Probation officer - at last visit I advised her to get the new meter  Pt's meals are: - Breakfast: [place] bacon, 2 eggs, toast; oatmeal; or brown rice - Lunch: Tuna, crackers or sandwich, chicken strips, chips, diet Pepsi - Dinner: Meat, veggies, starch  - Snacks: 2-3: 15g carbs (pretzels, chips)  She walks for exercise or rides her stationary bike.  -+ CKD, last BUN/creatinine:  05/25/2020: CMP normal except: Glu 129, BUNs/creatinine 44/1.29, GFR 41, otherwise normal 12/31/2019: 42/1.37, GFR 38, glucose 193, otherwise normal CMP Lab Results  Component Value Date   BUN 33 (H) 05/11/2019   BUN 72 (H) 05/09/2019   CREATININE 1.27 (H) 05/11/2019   CREATININE 2.01 (H) 05/09/2019  03/21/2018: CMP normal with the exception of high glucose at 143, BUN/creatinine 37/1.26, GFR 43 (kidney function improved from 09/2017) Urinalysis with 1+ protein 09/13/2017: Glucose 123, BUN 44/creatinine 1.34, GFR 40, previously 44 03/06/2017: ACR  222.8 08/29/2016: ACR 76.7 02/16/2016: 42/1.34 (GFR 40), Glu 213  12/26/2015: 25/1.23 (GFR 44), Glu 153 Off lisinopril.  -+ HL; last set of lipids: 05/25/2020: 106/204/33/40 12/31/2019: 102/147/33/43 Lab Results  Component Value Date   CHOL 110 02/04/2019   HDL 31.60 (L) 02/04/2019   LDLDIRECT 44.0 02/04/2019   TRIG 255.0 (H) 02/04/2019   CHOLHDL 3 02/04/2019  03/21/2018: 92/202/31/21 09/04/2017: 126/269/33/39 08/29/2016: 87/132/32/30 02/16/2016: 128/278/33/39  03/02/2014: 114/175/32/48 On Zocor 40.  - last eye exam was 12/2019: No DR (Dr.  Gershon Crane).  She has a history of cataract surgery.  -She has numbness and tingling in her feet.  She sees podiatry -Dr. Jacqualyn Posey.  She also has a history of HTN, GERD, anemia. She had amputation of 1/2 of the left hallux 05/06/2017.  This is now healed. She started Humira (for psoriasis) but did not tolerate this >> stopped. She started PT for her back pain. Also dry needling.   ROS: Constitutional: + weight gain/no weight loss, no fatigue, no subjective hyperthermia, no subjective hypothermia Eyes: no blurry vision, no xerophthalmia ENT: no sore throat, no nodules palpated in neck, no dysphagia, no odynophagia, no hoarseness Cardiovascular: no CP/no SOB/no palpitations/+ leg swelling (R ankle) Respiratory: no cough/no SOB/no wheezing Gastrointestinal: no N/no V/no D/no C/no acid reflux, + bloating Musculoskeletal: + muscle aches/+ joint aches Skin: no rashes, no hair loss Neurological: no tremors/+ numbness/+ tingling/no dizziness  I reviewed pt's medications, allergies, PMH, social hx, family hx, and changes were documented in the history of present illness. Otherwise, unchanged from my initial visit note.  Past Medical History:  Diagnosis Date  . Anemia in chronic renal disease 06/05/2011  . Anxiety    takes Ativan tid  . Asthma    has an inhaler prn  . Chronic kidney disease   . Cough   . DDD (degenerative disc disease), cervical 06/05/2011  . DDD (degenerative disc disease), lumbosacral 06/05/2011  . Depression    takes Cymbalta daily  . Diabetes mellitus    takes Amaryl and Metformin daily  . Diabetic neuropathy ([place])   . Diverticulosis   . Dizziness   . GERD (gastroesophageal reflux disease)    takes Protonix prn  . Headache(784.0)    occasionally  . History of bronchitis    last time 27yrs ago  . History of colon polyps   . History of MRSA infection   . HTN (hypertension) 06/05/2011   takes Prinizide daily  . Hyperlipidemia    takes Zocor daily  . Insomnia    not  on any meds at present time  . Osteoarthritis   . PONV (postoperative nausea and vomiting)    hard to wake up  . Psoriasis 06/05/2011   on legs and uses cream prn  . Seasonal allergies    but doesn't take any meds  . Sleep apnea    uses CPAP  . Tubular adenoma of colon    Past Surgical History:  Procedure Laterality Date  . ABDOMINAL HYSTERECTOMY  1982 and 1984  . AMPUTATION  02/12/2012   Procedure: AMPUTATION RAY;  Surgeon: Wylene Simmer, MD;  Location: [facility];  Service: Orthopedics;  Laterality: Right;  RIGHT 2ND TOE AMPUTATION   . BACK SURGERY     x 3  . CARDIAC CATHETERIZATION     08/09/04: 50% mid AV groove CX lesion, NL LM, LAD, Ramus, EF > 60% (Dr. Gwenlyn Found)  . CARPAL TUNNEL RELEASE Bilateral 2010  . CERVICAL SPINE SURGERY    . CHOLECYSTECTOMY  1977  .  COLONOSCOPY  05/30/2009, 09/30/13   [facility] 2011/Dr.Brodie 2015  . DILATION AND CURETTAGE OF UTERUS    . ELBOW SURGERY     left d/t tendonitis  . ESOPHAGOGASTRODUODENOSCOPY    . LUMBAR WOUND DEBRIDEMENT N/A 07/15/2012   Procedure: I & D of Lumbar Wound: Possible Repair of CSF Leak;  Surgeon: Elaina Hoops, MD;  Location: [facility];  Service: Neurosurgery;  Laterality: N/A;  I & D of Lumbar Wound: Possible Repair of CSF Leak placement lumbar drain  . POLYPECTOMY    . right knee arthroscopy     x 2  . SHOULDER ARTHROSCOPY Left   . TOE AMPUTATION Left    Social History   Social History  . Marital status: Married    Spouse name: N/A  . Number of children: 2   Occupational History  . Disabled    Social History Main Topics  . Smoking status: Never Smoker  . Smokeless tobacco: Never Used     Comment: never used tobacco  . Alcohol use No  . Drug use: No   Current Outpatient Medications on File Prior to Visit  Medication Sig Dispense Refill  . albuterol (PROVENTIL HFA;VENTOLIN HFA) 108 (90 BASE) MCG/ACT inhaler Inhale 1 puff into the lungs every 4 (four) hours as needed for wheezing or shortness of breath.     Marland Kitchen aspirin 81  MG tablet Take 81 mg by mouth daily.    . clobetasol (OLUX) 0.05 % topical foam Apply 1 application topically daily as needed (apply to head for dry scalp).     . DULoxetine (CYMBALTA) 30 MG capsule Take 90 mg by mouth daily.  5  . empagliflozin (JARDIANCE) 10 MG TABS tablet Take 1 tablet (10 mg total) by mouth daily. 90 tablet 1  . glipiZIDE (GLUCOTROL) 5 MG tablet TAKE 1 TABLET(5 MG) BY MOUTH TWICE DAILY BEFORE A MEAL 180 tablet 1  . glucose blood (ONETOUCH VERIO) test strip TEST TWICE DAILY 200 strip 11  . insulin aspart (NOVOLOG) 100 UNIT/ML injection Inject 7-10 Units into the skin 3 (three) times daily before meals. 30 mL 3  . insulin degludec (TRESIBA FLEXTOUCH) 200 UNIT/ML FlexTouch Pen Inject 52 units into the skin daily 18 mL 2  . Insulin Pen Needle 31G X 5 MM MISC by Does not apply route.    . Insulin Pen Needle 32G X 5 MM MISC by Does not apply route.    . Insulin Syringe-Needle U-100 (INSULIN SYRINGE 1CC/31GX5/16") 31G X 5/16" 1 ML MISC Use 4 syringes per day to inject insulin 120 each 5  . LORazepam (ATIVAN) 1 MG tablet Take 1 mg by mouth daily as needed for anxiety.     . metoprolol succinate (TOPROL-XL) 25 MG 24 hr tablet Take 25 mg by mouth daily. Taking 2 tablets daily    . Multiple Vitamins-Minerals (MULTIVITAMIN WITH MINERALS) tablet Take 1 tablet by mouth daily.     . Needle, Disp, 32G X 5/16" MISC by Does not apply route.    . Probiotic Product (PROBIOTIC DAILY PO) Take 1 capsule by mouth daily.     . simvastatin (ZOCOR) 40 MG tablet Take 40 mg by mouth at bedtime.     . triamcinolone (KENALOG) 0.025 % ointment Apply 1 application topically 2 (two) times daily. 30 g 0   No current facility-administered medications on file prior to visit.   Allergies  Allergen Reactions  . Victoza [Liraglutide] Hives and Rash  . Hydromorphone Hives, Diarrhea, Swelling, Rash and Other (  See Comments)  . Ultram [Tramadol] Rash  . Betadine [Povidone Iodine] Other (See Comments)    Skin  peels  . Bydureon [Exenatide] Diarrhea and Nausea And Vomiting  . Codeine Other (See Comments)    Severe headaches  . Contrast Media [Iodinated Diagnostic Agents] Nausea And Vomiting  . Humira [Adalimumab] Hives  . Hydrocodone Other (See Comments)    headache  . Invokana [Canagliflozin] Other (See Comments)    Cannot take this 2/2 PAD and toe amputations  . Iodine Hives  . Januvia [Sitagliptin] Diarrhea  . Morphine And Related Nausea And Vomiting  . Ozempic (0.25 Or 0.5 Mg-Dose) [Semaglutide(0.25 Or 0.5mg -Dos)] Nausea And Vomiting  . Percocet [Oxycodone-Acetaminophen] Other (See Comments)    Headache   . Sulfa Antibiotics Itching  . Ultram [Tramadol Hcl] Other (See Comments)    unknown  . Valium Other (See Comments)    headache  . Amoxicillin Hives and Rash    Did it involve swelling of the face/tongue/throat, SOB, or low BP? Unknown Did it involve sudden or severe rash/hives, skin peeling, or any reaction on the inside of your mouth or nose? Yes Did you need to seek medical attention at a hospital or doctor's office? Yes When did it last happen?unk If all above answers are "NO", may proceed with cephalosporin use.   Carlton Adam [Propoxyphene N-Acetaminophen] Rash and Other (See Comments)    headache  . Fentanyl And Related Rash  . Septra [Sulfamethoxazole-Trimethoprim] Rash  . Wellbutrin [Bupropion Hcl] Rash   Family History  Problem Relation Age of Onset  . Colon cancer Brother 49  . Hypertension Mother        died from infection s/p hip surgery  . Heart attack Father   . Colon cancer Maternal Grandfather   . Stomach cancer Neg Hx   . Esophageal cancer Neg Hx   . Rectal cancer Neg Hx   . Liver cancer Neg Hx   . Colon polyps Neg Hx    PE: BP (!) 154/64 (BP Location: Right Arm, Patient Position: Sitting, Cuff Size: Large)   Pulse 69   Ht 5\' 4"  (1.626 m)   Wt 230 lb 12.8 oz (104.7 kg)   SpO2 94%   BMI 39.62 kg/m  Wt Readings from Last 3 Encounters:   09/30/20 230 lb 12.8 oz (104.7 kg)  06/30/20 226 lb 6.4 oz (102.7 kg)  04/22/20 225 lb 6.4 oz (102.2 kg)   Constitutional: overweight, in NAD Eyes: PERRLA, EOMI, no exophthalmos ENT: moist mucous membranes, no thyromegaly, no cervical lymphadenopathy Cardiovascular: RRR, No MRG Respiratory: CTA B Gastrointestinal: abdomen soft, NT, ND, BS+ Musculoskeletal: no deformities, strength intact in all 4 Skin: moist, warm, no rashes Neurological: no tremor with outstretched hands, DTR normal in all 4  ASSESSMENT: 1. DM2, insulin-dependent, controlled, with complications: - PN - s/p B 2nd toe amputations 2/2 osteomyelitis - 2013 and 2015; 1/2 hallux 04/2017 - PAD - foot ulcer  2. HL  3.  Obesity class II  PLAN:  1. Patient with longstanding, uncontrolled, type 2 diabetes, on sulfonylurea, she has to eat better, long-acting insulin, to which we added rapid acting insulin.  We tried to use a GLP-1 receptor agonist but she developed diverticulitis and we had to stop.  We will retry Ozempic after her diverticulitis episode resolved, but she again developed abdominal pain and we had to stop.  Due to her decreased kidney function, we have her on the lower dose of SGLT2 inhibitor.  At last visit, I sent  a prescription for Farxiga 5 mg daily to her pharmacy.  She cannot use Invokana due to PAD and previous toe amputations. -At last visit, sugars were much better.  I advised her to vary the NovoLog more before meals, but otherwise we did not change the regimen.  HbA1c was better, at 8.1%. -At today's visit, sugars are slightly better in the morning and similar to before later in the day, however, she does not have any checks around the end of the day. We checked her HbA1c: 8.4% (higher).  However, this is not correlating well with her blood sugars at home.  Therefore, we discussed about checking a fructosamine level. -Based on her blood sugars at home, since sugars are slightly above target, we will go  ahead and increase her Jardiance from 10 to 25 mg daily.  Since she did not feel well in the past when increase in [place], we will keep the same dose for now.  Sugars after meals are usually controlled, so we do not need to change her NovoLog dose for now. - I suggested to:  Patient Instructions  Please increase: - Jardiance 25 mg daily before b'fast  Continue: - Glipizide 5 mg 2x a day before meals - Tresiba 52 units at bedtime  - Novolog 8-12 units 15 min before a meal 3x a day  Please come back for a follow-up appointment in 3 months.  - advised to check sugars at different times of the day - 3x a day, rotating check times - advised for yearly eye exams >> she is UTD - return to clinic in 3 months  2. HL -Reviewed latest lipid panel from 05/2020: LDL at goal but triglycerides high: 106/204/33/40 -Continue Zocor 40 mg daily without side effects  3.  Obesity class II -We will continue  Jardiance that should also help with weight loss -Unfortunately, she could not tolerate Ozempic due to diverticulitis and abdominal pain.  This would have helped with weight loss. -She gained approximately 4 pounds since last visit  Component     Latest Ref Rng & Units 09/30/2020  Hemoglobin A1C     4.0 - 5.6 % 8.4 (A)  Fructosamine     205 - 285 umol/L 297 (H)   HbA1c calculated from fructosamine is much better, at 6.6%, correlating more with her blood sugars at home.  Philemon Kingdom, MD PhD [facility]

## 2020-10-04 ENCOUNTER — Encounter: Payer: Self-pay | Admitting: Internal Medicine

## 2020-10-04 LAB — FRUCTOSAMINE: Fructosamine: 297 umol/L — ABNORMAL HIGH (ref 205–285)

## 2020-10-07 DIAGNOSIS — G8929 Other chronic pain: Secondary | ICD-10-CM | POA: Insufficient documentation

## 2020-10-07 DIAGNOSIS — M5442 Lumbago with sciatica, left side: Secondary | ICD-10-CM | POA: Diagnosis not present

## 2020-10-07 DIAGNOSIS — Z6841 Body Mass Index (BMI) 40.0 and over, adult: Secondary | ICD-10-CM | POA: Insufficient documentation

## 2020-10-12 ENCOUNTER — Other Ambulatory Visit: Payer: Self-pay | Admitting: Neurological Surgery

## 2020-10-12 DIAGNOSIS — G8929 Other chronic pain: Secondary | ICD-10-CM

## 2020-10-22 ENCOUNTER — Other Ambulatory Visit: Payer: Self-pay

## 2020-10-22 ENCOUNTER — Ambulatory Visit
Admission: RE | Admit: 2020-10-22 | Discharge: 2020-10-22 | Disposition: A | Discharge status: Home / Self Care | Payer: HMO | Source: Ambulatory Visit | Attending: Neurological Surgery | Admitting: Neurological Surgery

## 2020-10-22 DIAGNOSIS — G8929 Other chronic pain: Secondary | ICD-10-CM

## 2020-10-22 DIAGNOSIS — M4317 Spondylolisthesis, lumbosacral region: Secondary | ICD-10-CM | POA: Diagnosis not present

## 2020-10-22 MED ORDER — GADOBENATE DIMEGLUMINE 529 MG/ML IV SOLN
20.0000 mL | Freq: Once | INTRAVENOUS | Status: AC | PRN
  Administered 2020-10-22: 20 mL via INTRAVENOUS

## 2020-10-26 DIAGNOSIS — M4646 Discitis, unspecified, lumbar region: Secondary | ICD-10-CM | POA: Diagnosis not present

## 2020-11-08 ENCOUNTER — Ambulatory Visit: Payer: HMO | Admitting: Podiatry

## 2020-11-08 DIAGNOSIS — Z780 Asymptomatic menopausal state: Secondary | ICD-10-CM | POA: Diagnosis not present

## 2020-11-14 ENCOUNTER — Other Ambulatory Visit: Payer: Self-pay

## 2020-11-14 ENCOUNTER — Ambulatory Visit: Payer: HMO | Admitting: Podiatry

## 2020-11-14 DIAGNOSIS — L84 Corns and callosities: Secondary | ICD-10-CM | POA: Diagnosis not present

## 2020-11-14 DIAGNOSIS — E1149 Type 2 diabetes mellitus with other diabetic neurological complication: Secondary | ICD-10-CM | POA: Diagnosis not present

## 2020-11-14 DIAGNOSIS — M7989 Other specified soft tissue disorders: Secondary | ICD-10-CM | POA: Diagnosis not present

## 2020-11-14 MED ORDER — DOXYCYCLINE HYCLATE 100 MG PO TABS: 100.0000 mg | ORAL_TABLET | Freq: Two times a day (BID) | ORAL | 0 refills | Status: DC

## 2020-11-17 NOTE — Progress Notes (Signed)
Subjective: 70 year old female presents the office today for concerns of a new wound on the right fourth toe.  She said that has a blisterlike placed on the tip of her toe which she just noticed today.  Denies any swelling or redness or any drainage.  She denies any recent injury or trauma.  No recent treatment.  She currently denies any fevers, chills, nausea, vomiting.  Last A1c was 8.4 on Sep 30, 2020   Objective: AAO x3, NAD DP/PT pulses palpable bilaterally, CRT less than 3 seconds Sensation decreased. At the distal medial aspect of the right fourth toe is a hyperkeratotic lesion which appears to be an old blister.  Upon debridement there is an area of superficial skin breakdown but any probing, undermining or tunneling.  There is no erythema or warmth there is no ascending cellulitis.  There is no fluctuance or crepitation.  There is no probing. No open lesions or pre-ulcerative lesions.  No pain with calf compression, swelling, warmth, erythema  Assessment: Superficial wound right fourth toe  Plan: -All treatment options discussed with the patient including all alternatives, risks, complications.  -Sharply debrided the callus, scab without any complications to reveal the underlying wound.  There is a superficial. Given the localized edema no order doxycycline.  Recommended offloading at all times.  Discussed importance today for inspection, glucose control. -Patient encouraged to call the office with any questions, concerns, change in symptoms.   Return in about 2 weeks (around 11/28/2020) for toe ulcer right 4th.  X-ray next appointment  Trula Slade DPM

## 2020-11-18 DIAGNOSIS — M5442 Lumbago with sciatica, left side: Secondary | ICD-10-CM | POA: Diagnosis not present

## 2020-11-21 ENCOUNTER — Telehealth: Payer: Self-pay | Admitting: *Deleted

## 2020-11-21 ENCOUNTER — Other Ambulatory Visit: Payer: Self-pay | Admitting: Podiatry

## 2020-11-21 DIAGNOSIS — E11621 Type 2 diabetes mellitus with foot ulcer: Secondary | ICD-10-CM | POA: Diagnosis not present

## 2020-11-21 DIAGNOSIS — I129 Hypertensive chronic kidney disease with stage 1 through stage 4 chronic kidney disease, or unspecified chronic kidney disease: Secondary | ICD-10-CM | POA: Diagnosis not present

## 2020-11-21 DIAGNOSIS — E1142 Type 2 diabetes mellitus with diabetic polyneuropathy: Secondary | ICD-10-CM | POA: Diagnosis not present

## 2020-11-21 DIAGNOSIS — E782 Mixed hyperlipidemia: Secondary | ICD-10-CM | POA: Diagnosis not present

## 2020-11-21 DIAGNOSIS — N183 Chronic kidney disease, stage 3 unspecified: Secondary | ICD-10-CM | POA: Diagnosis not present

## 2020-11-21 MED ORDER — CLINDAMYCIN HCL 300 MG PO CAPS: 300.0000 mg | ORAL_CAPSULE | Freq: Three times a day (TID) | ORAL | 0 refills | Status: DC

## 2020-11-21 NOTE — Telephone Encounter (Signed)
Patient is wanting to inform that she tried for 2 days to take the Doxycycline,caused stomach problems. Can something else be prescribled. Please advise.

## 2020-11-22 ENCOUNTER — Encounter: Payer: Self-pay | Admitting: Podiatry

## 2020-11-22 ENCOUNTER — Ambulatory Visit: Payer: HMO | Admitting: Podiatry

## 2020-11-22 ENCOUNTER — Other Ambulatory Visit: Payer: Self-pay

## 2020-11-22 DIAGNOSIS — Z89422 Acquired absence of other left toe(s): Secondary | ICD-10-CM | POA: Diagnosis not present

## 2020-11-22 DIAGNOSIS — M79675 Pain in left toe(s): Secondary | ICD-10-CM

## 2020-11-22 DIAGNOSIS — Z89421 Acquired absence of other right toe(s): Secondary | ICD-10-CM

## 2020-11-22 DIAGNOSIS — M79674 Pain in right toe(s): Secondary | ICD-10-CM

## 2020-11-22 DIAGNOSIS — E1149 Type 2 diabetes mellitus with other diabetic neurological complication: Secondary | ICD-10-CM

## 2020-11-22 NOTE — Progress Notes (Signed)
This patient returns to my office for at risk foot care.  This patient requires this care by a professional since this patient will be at risk due to having diabetes with peripheral neuropathy and CKD. This patient is unable to cut nails herself since the patient cannot reach her nails.These nails are painful walking and wearing shoes.  This patient presents for at risk foot care today.  General Appearance  Alert, conversant and in no acute stress.  Vascular  Dorsalis pedis and posterior tibial  pulses are palpable  bilaterally.  Capillary return is within normal limits  bilaterally. Temperature is within normal limits  bilaterally.  Neurologic  Senn-Weinstein monofilament wire test diminished  bilaterally. Muscle power within normal limits bilaterally.  Nails Thick disfigured discolored nails with subungual debris  3-5 left toes  and 1,3-5 right foot. No evidence of bacterial infection or drainage bilaterally.  Orthopedic  No limitations of motion  feet .  No crepitus or effusions noted.  No bony pathology or digital deformities noted. Amputation second toes  B/L.  Distal symes left hallux.  Skin  normotropic skin with no porokeratosis noted bilaterally.  No signs of infections or ulcers noted.  Healing 4th toe with no pain or redness or drainage.   Onychomycosis  Pain in right toes  Pain in left toes  S/P ulcer 4th toe right foot.  Consent was obtained for treatment procedures.   Mechanical debridement of nails  bilaterally performed with a nail nipper.  Filed with dremel without incident. Padding applied 4th toe right foot.   Return office visit   3 months                   Told patient to return for periodic foot care and evaluation due to potential at risk complications.   Gardiner Barefoot DPM

## 2020-11-22 NOTE — Telephone Encounter (Signed)
Returned call to paitient,no answer, left vmessage that new medication has been sent to pharmacy on file.

## 2020-11-23 ENCOUNTER — Encounter: Payer: Self-pay | Admitting: Internal Medicine

## 2020-11-23 ENCOUNTER — Other Ambulatory Visit: Payer: Self-pay

## 2020-11-23 DIAGNOSIS — E1142 Type 2 diabetes mellitus with diabetic polyneuropathy: Secondary | ICD-10-CM

## 2020-11-23 DIAGNOSIS — IMO0002 Reserved for concepts with insufficient information to code with codable children: Secondary | ICD-10-CM

## 2020-11-23 MED ORDER — TRESIBA FLEXTOUCH 200 UNIT/ML ~~LOC~~ SOPN: PEN_INJECTOR | SUBCUTANEOUS | 2 refills | Status: DC

## 2020-11-23 NOTE — Progress Notes (Unsigned)
Received labs from PCP from 11/21/2020: CMP normal, except glucose 106, BUN/creatinine 49/1.36, GFR 43 Lipids: 117/204/35/49

## 2020-11-24 ENCOUNTER — Other Ambulatory Visit (HOSPITAL_COMMUNITY): Payer: Self-pay | Admitting: Neurological Surgery

## 2020-11-24 ENCOUNTER — Other Ambulatory Visit: Payer: Self-pay | Admitting: Neurological Surgery

## 2020-11-24 DIAGNOSIS — G8929 Other chronic pain: Secondary | ICD-10-CM

## 2020-11-24 DIAGNOSIS — M5442 Lumbago with sciatica, left side: Secondary | ICD-10-CM

## 2020-11-25 ENCOUNTER — Encounter: Payer: Self-pay | Admitting: Internal Medicine

## 2020-11-28 ENCOUNTER — Encounter: Payer: Self-pay | Admitting: Internal Medicine

## 2020-11-29 ENCOUNTER — Ambulatory Visit (INDEPENDENT_AMBULATORY_CARE_PROVIDER_SITE_OTHER): Payer: HMO

## 2020-11-29 ENCOUNTER — Encounter: Payer: Self-pay | Admitting: Podiatry

## 2020-11-29 ENCOUNTER — Other Ambulatory Visit: Payer: Self-pay

## 2020-11-29 ENCOUNTER — Ambulatory Visit: Payer: HMO | Admitting: Podiatry

## 2020-11-29 DIAGNOSIS — E1149 Type 2 diabetes mellitus with other diabetic neurological complication: Secondary | ICD-10-CM | POA: Diagnosis not present

## 2020-11-29 DIAGNOSIS — L84 Corns and callosities: Secondary | ICD-10-CM

## 2020-11-30 ENCOUNTER — Ambulatory Visit (HOSPITAL_COMMUNITY): Payer: HMO

## 2020-12-01 ENCOUNTER — Ambulatory Visit (HOSPITAL_COMMUNITY)
Admission: RE | Admit: 2020-12-01 | Discharge: 2020-12-01 | Disposition: A | Discharge status: Home / Self Care | Payer: HMO | Source: Ambulatory Visit | Attending: Neurological Surgery | Admitting: Neurological Surgery

## 2020-12-01 ENCOUNTER — Other Ambulatory Visit: Payer: Self-pay

## 2020-12-01 DIAGNOSIS — M5442 Lumbago with sciatica, left side: Secondary | ICD-10-CM | POA: Diagnosis not present

## 2020-12-01 DIAGNOSIS — G8929 Other chronic pain: Secondary | ICD-10-CM | POA: Insufficient documentation

## 2020-12-01 DIAGNOSIS — Z01818 Encounter for other preprocedural examination: Secondary | ICD-10-CM | POA: Diagnosis not present

## 2020-12-01 DIAGNOSIS — M5124 Other intervertebral disc displacement, thoracic region: Secondary | ICD-10-CM | POA: Diagnosis not present

## 2020-12-01 DIAGNOSIS — M5135 Other intervertebral disc degeneration, thoracolumbar region: Secondary | ICD-10-CM | POA: Diagnosis not present

## 2020-12-01 DIAGNOSIS — M4314 Spondylolisthesis, thoracic region: Secondary | ICD-10-CM | POA: Diagnosis not present

## 2020-12-04 NOTE — Progress Notes (Signed)
Subjective: 70 year old female presents the office today for follow-up evaluation of wound on the right fourth toe.  She said that she is doing good and she is off of the antibiotics and not had any drainage or swelling or any redness.  She thinks the wound is healing well.  She is continue with daily dressing changes as well.  No fevers or chills.  No other concerns.  Last A1c was 8.4 on Sep 30, 2020   Objective: AAO x3, NAD DP/PT pulses palpable bilaterally, CRT less than 3 seconds Sensation decreased. At the distal medial aspect of the right fourth toe is a hyperkeratotic lesion with a central granular wound.  After debridement the wound appears to be smaller and almost healed.  There is no surrounding erythema, ascending cellulitis.  There is trace edema.  No fluctuation crepitation but there is malodor.  Digital deformities noted. No open lesions or pre-ulcerative lesions.  No pain with calf compression, swelling, warmth, erythema  Assessment: Superficial wound right fourth toe-improving  Plan: -All treatment options discussed with the patient including all alternatives, risks, complications.  -X-rays obtained reviewed.  No definitive evidence of acute osteomyelitis although difficult to fully evaluate given that the digital deformity. -Sharply debrided the ulceration utilizing #[id number] with scalpel blade and complications down to healthy, viable tissue to remove devitalized nonviable tissue.  There is no blood loss.  She tolerated the procedure well.  She is to continue with every day dressing changes and offloading.  Offloading pads were dispensed.  Finish course of antibiotics.  Trula Slade DPM

## 2020-12-13 ENCOUNTER — Ambulatory Visit: Payer: HMO | Admitting: Podiatry

## 2020-12-13 ENCOUNTER — Encounter: Payer: Self-pay | Admitting: Podiatry

## 2020-12-13 ENCOUNTER — Other Ambulatory Visit: Payer: Self-pay

## 2020-12-13 DIAGNOSIS — L84 Corns and callosities: Secondary | ICD-10-CM

## 2020-12-13 DIAGNOSIS — E1149 Type 2 diabetes mellitus with other diabetic neurological complication: Secondary | ICD-10-CM

## 2020-12-13 NOTE — Progress Notes (Signed)
Surgical Instructions    Your procedure is scheduled on Thursday August 18.  Report to [facility] Main Entrance "A" at 5:30 A.M., then check in with the Admitting office.  Call this number if you have problems the morning of surgery:  [phone]   If you have any questions prior to your surgery date call [phone]: Open Monday-Friday 8am-4pm    Remember:  Do not eat after midnight the night before your surgery  You may drink clear liquids until 4:30am the morning of your surgery.   Clear liquids allowed are: Water, Non-Citrus Juices (without pulp), Carbonated Beverages, Clear Tea, Black Coffee Only, and Gatorade    Take these medicines the morning of surgery with A SIP OF WATER            albuterol (PROVENTIL HFA;VENTOLIN HFA) if needed           budesonide-formoterol (SYMBICORT)  if needed           DULoxetine (CYMBALTA)            LORazepam (ATIVAN) if needed           metoprolol succinate (TOPROL-XL)            ondansetron (ZOFRAN)           pantoprazole (PROTONIX)   As of today, STOP taking any Aspirin (unless otherwise instructed by your surgeon) Aleve, Naproxen, Ibuprofen, Motrin, Advil, Goody's, BC's, all herbal medications, fish oil, and all vitamins.   WHAT DO I DO ABOUT MY DIABETES MEDICATION?   Do not take oral diabetes medicines (pills) the morning of surgery. DO NOT take empagliflozin (JARDIANCE) the day before surgery and do not take it the day of surgery. DO NOT take evening dose of glipiZIDE (GLUCOTROL) the day before surgery. Do not take glipiZIDE (GLUCOTROL) the day of surgery.   THE NIGHT BEFORE SURGERY, take 26 units of insulin degludec (TRESIBA FLEXTOUCH) insulin.       THE MORNING OF SURGERY, do not take insulin aspart (NOVOLOG)unless your CBG is greater than '220mg'$ /dl,then you may take 5  units of   insulin aspart (NOVOLOG).     HOW TO MANAGE YOUR DIABETES BEFORE AND AFTER SURGERY  Why is it important to control my blood sugar before and after  surgery? Improving blood sugar levels before and after surgery helps healing and can limit problems. A way of improving blood sugar control is eating a healthy diet by:  Eating less sugar and carbohydrates  Increasing activity/exercise  Talking with your doctor about reaching your blood sugar goals High blood sugars (greater than 180 mg/dL) can raise your risk of infections and slow your recovery, so you will need to focus on controlling your diabetes during the weeks before surgery. Make sure that the doctor who takes care of your diabetes knows about your planned surgery including the date and location.  How do I manage my blood sugar before surgery? Check your blood sugar at least 4 times a day, starting 2 days before surgery, to make sure that the level is not too high or low.  Check your blood sugar the morning of your surgery when you wake up and every 2 hours until you get to the Short Stay unit.  If your blood sugar is less than 70 mg/dL, you will need to treat for low blood sugar: Do not take insulin. Treat a low blood sugar (less than 70 mg/dL) with  cup of clear juice (cranberry or apple), 4 glucose tablets, OR glucose  gel. Recheck blood sugar in 15 minutes after treatment (to make sure it is greater than 70 mg/dL). If your blood sugar is not greater than 70 mg/dL on recheck, call [phone] for further instructions. Report your blood sugar to the short stay nurse when you get to Short Stay.  If you are admitted to the hospital after surgery: Your blood sugar will be checked by the staff and you will probably be given insulin after surgery (instead of oral diabetes medicines) to make sure you have good blood sugar levels. The goal for blood sugar control after surgery is 80-180 mg/dL.           Do not wear jewelry or makeup Do not wear lotions, powders, perfumes/colognes, or deodorant. Do not shave 48 hours prior to surgery.  Men may shave face and neck. Do not bring  valuables to the hospital. DO Not wear nail polish, gel polish, artificial nails, or any other type of covering on  natural nails including finger and toenails. If patients have artificial nails, gel coating, etc. that need to be removed by a nail salon please have this removed prior to surgery or surgery may need to be canceled/delayed if the surgeon/ anesthesia feels like the patient is unable to be adequately monitored.             [facility] is not responsible for any belongings or valuables.  Do NOT Smoke (Tobacco/Vaping) or drink Alcohol 24 hours prior to your procedure If you use a CPAP at night, you may bring all equipment for your overnight stay.   Contacts, glasses, dentures or bridgework may not be worn into surgery, please bring cases for these belongings   For patients admitted to the hospital, discharge time will be determined by your treatment team.   Patients discharged the day of surgery will not be allowed to drive home, and someone needs to stay with them for 24 hours.  ONLY 1 SUPPORT PERSON MAY BE PRESENT WHILE YOU ARE IN SURGERY. IF YOU ARE TO BE ADMITTED ONCE YOU ARE IN YOUR ROOM YOU WILL BE ALLOWED TWO (2) VISITORS.  Minor children may have two parents present. Special consideration for safety and communication needs will be reviewed on a case by case basis.  Special instructions:    Oral Hygiene is also important to reduce your risk of infection.  Remember - BRUSH YOUR TEETH THE MORNING OF SURGERY WITH YOUR REGULAR TOOTHPASTE   [facility]- Preparing For Surgery  Before surgery, you can play an important role. Because skin is not sterile, your skin needs to be as free of germs as possible. You can reduce the number of germs on your skin by washing with CHG (chlorahexidine gluconate) Soap before surgery.  CHG is an antiseptic cleaner which kills germs and bonds with the skin to continue killing germs even after washing.     Please do not use if you have an allergy  to CHG or antibacterial soaps. If your skin becomes reddened/irritated stop using the CHG.  Do not shave (including legs and underarms) for at least 48 hours prior to first CHG shower. It is OK to shave your face.  Please follow these instructions carefully.     Shower the NIGHT BEFORE SURGERY and the MORNING OF SURGERY with CHG Soap.   If you chose to wash your hair, wash your hair first as usual with your normal shampoo. After you shampoo, rinse your hair and body thoroughly to remove the shampoo.  Then  Wash Face and genitals (private parts) with your normal soap and rinse thoroughly to remove soap.  After that Use CHG Soap as you would any other liquid soap. You can apply CHG directly to the skin and wash gently with a scrungie or a clean washcloth.   Apply the CHG Soap to your body ONLY FROM THE NECK DOWN.  Do not use on open wounds or open sores. Avoid contact with your eyes, ears, mouth and genitals (private parts). Wash Face and genitals (private parts)  with your normal soap.   Wash thoroughly, paying special attention to the area where your surgery will be performed.  Thoroughly rinse your body with warm water from the neck down.  DO NOT shower/wash with your normal soap after using and rinsing off the CHG Soap.  Pat yourself dry with a CLEAN TOWEL.  Wear CLEAN PAJAMAS to bed the night before surgery  Place CLEAN SHEETS on your bed the night before your surgery  DO NOT SLEEP WITH PETS.   Day of Surgery:  Take a shower with CHG soap. Wear Clean/Comfortable clothing the morning of surgery Do not apply any deodorants/lotions.   Remember to brush your teeth WITH YOUR REGULAR TOOTHPASTE.   Please read over the following fact sheets that you were given.

## 2020-12-14 ENCOUNTER — Encounter (HOSPITAL_COMMUNITY)
Admission: RE | Admit: 2020-12-14 | Discharge: 2020-12-14 | Disposition: A | Discharge status: Home / Self Care | Payer: HMO | Source: Ambulatory Visit | Attending: Neurological Surgery | Admitting: Neurological Surgery

## 2020-12-14 ENCOUNTER — Other Ambulatory Visit: Payer: Self-pay

## 2020-12-14 ENCOUNTER — Encounter (HOSPITAL_COMMUNITY): Payer: Self-pay

## 2020-12-14 DIAGNOSIS — Z01818 Encounter for other preprocedural examination: Secondary | ICD-10-CM | POA: Diagnosis not present

## 2020-12-14 LAB — TYPE AND SCREEN
ABO/RH(D): A POS
Antibody Screen: NEGATIVE

## 2020-12-14 LAB — BASIC METABOLIC PANEL
Anion gap: 9 (ref 5–15)
BUN: 40 mg/dL — ABNORMAL HIGH (ref 8–23)
CO2: 25 mmol/L (ref 22–32)
Calcium: 8.6 mg/dL — ABNORMAL LOW (ref 8.9–10.3)
Chloride: 104 mmol/L (ref 98–111)
Creatinine, Ser: 1.5 mg/dL — ABNORMAL HIGH (ref 0.44–1.00)
GFR, Estimated: 38 mL/min — ABNORMAL LOW (ref >60)
Glucose, Bld: 151 mg/dL — ABNORMAL HIGH (ref 70–99)
Potassium: 4.6 mmol/L (ref 3.5–5.1)
Sodium: 138 mmol/L (ref 135–145)

## 2020-12-14 LAB — GLUCOSE, CAPILLARY: Glucose-Capillary: 150 mg/dL — ABNORMAL HIGH (ref 70–99)

## 2020-12-14 LAB — CBC
HCT: 33.9 % — ABNORMAL LOW (ref 36.0–46.0)
Hemoglobin: 11.2 g/dL — ABNORMAL LOW (ref 12.0–15.0)
MCH: 31.2 pg (ref 26.0–34.0)
MCHC: 33 g/dL (ref 30.0–36.0)
MCV: 94.4 fL (ref 80.0–100.0)
Platelets: 256 10*3/uL (ref 150–400)
RBC: 3.59 MIL/uL — ABNORMAL LOW (ref 3.87–5.11)
RDW: 12.5 % (ref 11.5–15.5)
WBC: 10.3 10*3/uL (ref 4.0–10.5)
nRBC: 0 % (ref 0.0–0.2)

## 2020-12-14 LAB — HEMOGLOBIN A1C
Hgb A1c MFr Bld: 7.8 % — ABNORMAL HIGH (ref 4.8–5.6)
Mean Plasma Glucose: 177.16 mg/dL

## 2020-12-14 LAB — SURGICAL PCR SCREEN
MRSA, PCR: NEGATIVE
Staphylococcus aureus: NEGATIVE

## 2020-12-14 NOTE — Progress Notes (Signed)
PCP: Mayra Neer, MD Cardiologist: Quay Burow, MD  EKG: 12/14/20 CXR: 08/11/14 ECHO: 2006 Stress Test: 2006 Cardiac Cath: 08/09/04  Fasting Blood Sugar- 120-150 Checks Blood Sugar_1__ times a day  OSA/CPAP: Yes, wears nightly  ASA: Last dose 12/13/20 Blood Thinner: No  Covid test 12/19/20.  Gave instructions, map and requisition form.  Anesthesia Review: yes, cardiac workup 2006.  Per patient, was told had CAD.  Records requested from Dr. Quay Burow.  Patient denies shortness of breath, fever, cough, and chest pain at PAT appointment.  Patient verbalized understanding of instructions provided today at the PAT appointment.  Patient asked to review instructions at home and day of surgery.

## 2020-12-15 NOTE — Progress Notes (Signed)
Anesthesia Chart Review:  Patient evaluated remotely by cardiologist Dr. Gwenlyn Found for non-critical CAD by cath at [facility] of 08/09/04.  This showed a normal left main, LAD, ramus intermedius branch, but a 50% hypodense lesion in the mid AV groove circumflex, EF > 60%. Her last nuclear stress test was on 08/18/08 and showed normal myocardial perfusion demonstrating an attenuation artifact in the anterior region of the myocardium. No ischemia or infarct/scar seen in the remaining myocardium. Post stress LV function is normal with EF 76%. Low risk scan. These results are scanned under the Media tab in Correspondence dated 07/24/2012. It appears she was last seen by cardiology around 2010.   Pt last seen by her PCP Dr. Mayra Neer 11/21/20. Discussed upcoming surgery. No changes made to management of IDDM2, CKD, HLD.  IDDM2 followed by endocrinologist Dr. Cruzita Lederer.  Preop labs reviewed, creatinine 1.50 c/w hx of CKD, mild anemia with hgb 11.2, DM2 suboptimally controlled with A1c 7.8 (although this is improved from 8.4 09/30/20), otherwise unremarkable.   EKG 12/14/20: Normal sinus rhythm. Rate 61. Minimal voltage criteria for LVH, may be normal variant ( R in aVL ). Anterolateral infarct , age undetermined Poor R wave progression in precordial leads. No significant change since last tracing   [name] [facility] Short Stay Center/Anesthesiology Phone [phone] 12/16/2020 11:25 AM

## 2020-12-16 NOTE — Progress Notes (Signed)
Subjective: 70 year old female presents the office today for follow-up evaluation of wound on the right fourth toe.  She states overall doing much better.  No swelling redness or any drainage.  She is scheduled for back surgery next week.  She has no other concerns today.  No other ulcerations that she noted.   Last A1c was 8.4 on Sep 30, 2020   Objective: AAO x3, NAD DP/PT pulses palpable bilaterally, CRT less than 3 seconds Sensation decreased. At the distal medial aspect of the right fourth toe is a minimal hyperkeratotic lesion and upon debridement there is no underlying ulceration drainage or signs of infection.  There is no ulcerations identified bilaterally.  Digital contractures present.   No pain with calf compression, swelling, warmth, erythema  Assessment: Preulcerative callus right fourth toe  Plan: -All treatment options discussed with the patient including all alternatives, risks, complications.  -Sharply debrided the hyperkeratotic lesion without any complications or bleeding today.  Recommended offloading daily.  Discussed daily foot inspection and monitor for any reoccurrence.  RTC 6-8 weeks or sooner if needed  Trula Slade DPM

## 2020-12-16 NOTE — Anesthesia Preprocedure Evaluation (Addendum)
Anesthesia Evaluation  Patient identified by MRN, date of birth, ID band Patient awake    Reviewed: Allergy & Precautions, H&P , NPO status , Patient's Chart, lab work & pertinent test results, reviewed documented beta blocker date and time   History of Anesthesia Complications (+) PONV  Airway Mallampati: II  TM Distance: >3 FB Neck ROM: Full    Dental no notable dental hx.    Pulmonary asthma , sleep apnea and Continuous Positive Airway Pressure Ventilation ,    Pulmonary exam normal breath sounds clear to auscultation       Cardiovascular Exercise Tolerance: Good hypertension, Pt. on medications and Pt. on home beta blockers Normal cardiovascular exam Rhythm:Regular Rate:Normal  ECG: NSR, rate 61   Neuro/Psych  Headaches, PSYCHIATRIC DISORDERS Anxiety Depression  Neuromuscular disease    GI/Hepatic Neg liver ROS, GERD  Medicated and Controlled,  Endo/Other  diabetes, Insulin DependentMorbid obesity  Renal/GU Renal disease     Musculoskeletal  (+) Arthritis ,   Abdominal (+) + obese,   Peds  Hematology  (+) Blood dyscrasia, anemia , HLD   Anesthesia Other Findings Chronic bilateral low back pain with left sided sciatica  Reproductive/Obstetrics                          Anesthesia Physical Anesthesia Plan  ASA: 3  Anesthesia Plan: General   Post-op Pain Management:    Induction: Intravenous  PONV Risk Score and Plan: 4 or greater and Ondansetron, Dexamethasone, Midazolam, Aprepitant and Treatment may vary due to age or medical condition  Airway Management Planned: Oral ETT  Additional Equipment: Arterial line, CVP and Ultrasound Guidance Line Placement  Intra-op Plan:   Post-operative Plan: Extubation in OR  Informed Consent: I have reviewed the patients History and Physical, chart, labs and discussed the procedure including the risks, benefits and alternatives for the proposed  anesthesia with the patient or authorized representative who has indicated his/her understanding and acceptance.     Dental advisory given  Plan Discussed with: CRNA  Anesthesia Plan Comments: (Reviewed PAT note by Karoline Caldwell, PA-C: Patient evaluated remotely by cardiologist Dr. Gwenlyn Found for non-critical CAD by cath at [facility] of 08/09/04. This showed a normal left main, LAD, ramus intermedius branch, but a 50% hypodense lesion in the mid AV groove circumflex, EF >60%. Her last nuclear stress test was on 08/18/08 and showed normal myocardial perfusion demonstrating an attenuation artifact in the anterior region of the myocardium. No ischemia or infarct/scar seen in the remaining myocardium. Post stress LV function is normal with EF 76%. Low risk scan. These results are scanned under the Media tab in Correspondence dated 07/24/2012. It appears she was last seen by cardiology around 2010.   Pt last seen by her PCP Dr. Mayra Neer 11/21/20. Discussed upcoming surgery. No changes made to management of IDDM2, CKD, HLD.  IDDM2 followed by endocrinologist Dr. Cruzita Lederer.  Preop labs reviewed, creatinine 1.50 c/w hx of CKD, mild anemia with hgb 11.2, DM2 suboptimally controlled with A1c 7.8 (although this is improved from 8.4 09/30/20), otherwise unremarkable.   EKG 12/14/20: Normal sinus rhythm. Rate 61. Minimal voltage criteria for LVH, may be normal variant ( R in aVL ). Anterolateral infarct , age undetermined Poor R wave progression in precordial leads. No significant change since last tracing )      Anesthesia Quick Evaluation

## 2020-12-19 ENCOUNTER — Other Ambulatory Visit: Payer: Self-pay | Admitting: Neurological Surgery

## 2020-12-20 LAB — SARS CORONAVIRUS 2 (TAT 6-24 HRS): SARS Coronavirus 2: NEGATIVE

## 2020-12-22 ENCOUNTER — Inpatient Hospital Stay (HOSPITAL_COMMUNITY)
Admission: RE | Admit: 2020-12-22 | Discharge: 2020-12-24 | DRG: 454 | Disposition: A | Discharge status: Home / Self Care | Payer: HMO | Attending: Neurological Surgery | Admitting: Neurological Surgery

## 2020-12-22 ENCOUNTER — Inpatient Hospital Stay (HOSPITAL_COMMUNITY): Payer: HMO | Admitting: Certified Registered Nurse Anesthetist

## 2020-12-22 ENCOUNTER — Inpatient Hospital Stay (HOSPITAL_COMMUNITY): Payer: HMO

## 2020-12-22 ENCOUNTER — Other Ambulatory Visit: Payer: Self-pay

## 2020-12-22 ENCOUNTER — Encounter (HOSPITAL_COMMUNITY)
Admission: RE | Disposition: A | Discharge status: Home / Self Care | Payer: Self-pay | Source: Home / Self Care | Attending: Neurological Surgery

## 2020-12-22 ENCOUNTER — Encounter (HOSPITAL_COMMUNITY): Payer: Self-pay | Admitting: Neurological Surgery

## 2020-12-22 ENCOUNTER — Inpatient Hospital Stay (HOSPITAL_COMMUNITY): Payer: HMO | Admitting: Physician Assistant

## 2020-12-22 DIAGNOSIS — Z419 Encounter for procedure for purposes other than remedying health state, unspecified: Secondary | ICD-10-CM

## 2020-12-22 DIAGNOSIS — I129 Hypertensive chronic kidney disease with stage 1 through stage 4 chronic kidney disease, or unspecified chronic kidney disease: Secondary | ICD-10-CM | POA: Diagnosis not present

## 2020-12-22 DIAGNOSIS — F32A Depression, unspecified: Secondary | ICD-10-CM | POA: Diagnosis present

## 2020-12-22 DIAGNOSIS — Z880 Allergy status to penicillin: Secondary | ICD-10-CM

## 2020-12-22 DIAGNOSIS — M4805 Spinal stenosis, thoracolumbar region: Secondary | ICD-10-CM | POA: Diagnosis present

## 2020-12-22 DIAGNOSIS — Z8249 Family history of ischemic heart disease and other diseases of the circulatory system: Secondary | ICD-10-CM | POA: Diagnosis not present

## 2020-12-22 DIAGNOSIS — Z6841 Body Mass Index (BMI) 40.0 and over, adult: Secondary | ICD-10-CM | POA: Diagnosis not present

## 2020-12-22 DIAGNOSIS — M5136 Other intervertebral disc degeneration, lumbar region: Secondary | ICD-10-CM | POA: Diagnosis not present

## 2020-12-22 DIAGNOSIS — K219 Gastro-esophageal reflux disease without esophagitis: Secondary | ICD-10-CM | POA: Diagnosis not present

## 2020-12-22 DIAGNOSIS — M2578 Osteophyte, vertebrae: Secondary | ICD-10-CM | POA: Diagnosis present

## 2020-12-22 DIAGNOSIS — M4317 Spondylolisthesis, lumbosacral region: Secondary | ICD-10-CM | POA: Diagnosis present

## 2020-12-22 DIAGNOSIS — Z90710 Acquired absence of both cervix and uterus: Secondary | ICD-10-CM

## 2020-12-22 DIAGNOSIS — M5105 Intervertebral disc disorders with myelopathy, thoracolumbar region: Secondary | ICD-10-CM | POA: Diagnosis present

## 2020-12-22 DIAGNOSIS — Z91041 Radiographic dye allergy status: Secondary | ICD-10-CM | POA: Diagnosis not present

## 2020-12-22 DIAGNOSIS — Z981 Arthrodesis status: Secondary | ICD-10-CM

## 2020-12-22 DIAGNOSIS — Z7982 Long term (current) use of aspirin: Secondary | ICD-10-CM | POA: Diagnosis not present

## 2020-12-22 DIAGNOSIS — M4715 Other spondylosis with myelopathy, thoracolumbar region: Principal | ICD-10-CM | POA: Diagnosis present

## 2020-12-22 DIAGNOSIS — N189 Chronic kidney disease, unspecified: Secondary | ICD-10-CM | POA: Diagnosis present

## 2020-12-22 DIAGNOSIS — Z888 Allergy status to other drugs, medicaments and biological substances status: Secondary | ICD-10-CM

## 2020-12-22 DIAGNOSIS — Z7984 Long term (current) use of oral hypoglycemic drugs: Secondary | ICD-10-CM

## 2020-12-22 DIAGNOSIS — M4326 Fusion of spine, lumbar region: Secondary | ICD-10-CM | POA: Diagnosis not present

## 2020-12-22 DIAGNOSIS — E1140 Type 2 diabetes mellitus with diabetic neuropathy, unspecified: Secondary | ICD-10-CM | POA: Diagnosis present

## 2020-12-22 DIAGNOSIS — Z800 Family history of malignant neoplasm of digestive organs: Secondary | ICD-10-CM | POA: Diagnosis not present

## 2020-12-22 DIAGNOSIS — Z882 Allergy status to sulfonamides status: Secondary | ICD-10-CM | POA: Diagnosis not present

## 2020-12-22 DIAGNOSIS — J45909 Unspecified asthma, uncomplicated: Secondary | ICD-10-CM | POA: Diagnosis present

## 2020-12-22 DIAGNOSIS — Z79899 Other long term (current) drug therapy: Secondary | ICD-10-CM

## 2020-12-22 DIAGNOSIS — R262 Difficulty in walking, not elsewhere classified: Secondary | ICD-10-CM | POA: Diagnosis present

## 2020-12-22 DIAGNOSIS — M5104 Intervertebral disc disorders with myelopathy, thoracic region: Secondary | ICD-10-CM | POA: Diagnosis present

## 2020-12-22 DIAGNOSIS — Z794 Long term (current) use of insulin: Secondary | ICD-10-CM

## 2020-12-22 DIAGNOSIS — E785 Hyperlipidemia, unspecified: Secondary | ICD-10-CM | POA: Diagnosis not present

## 2020-12-22 DIAGNOSIS — G4730 Sleep apnea, unspecified: Secondary | ICD-10-CM | POA: Diagnosis present

## 2020-12-22 DIAGNOSIS — M4714 Other spondylosis with myelopathy, thoracic region: Secondary | ICD-10-CM | POA: Diagnosis present

## 2020-12-22 DIAGNOSIS — Z885 Allergy status to narcotic agent status: Secondary | ICD-10-CM | POA: Diagnosis not present

## 2020-12-22 HISTORY — PX: APPLICATION OF ROBOTIC ASSISTANCE FOR SPINAL PROCEDURE: SHX6753

## 2020-12-22 HISTORY — PX: ANTERIOR LAT LUMBAR FUSION: SHX1168

## 2020-12-22 LAB — GLUCOSE, CAPILLARY
Glucose-Capillary: 212 mg/dL — ABNORMAL HIGH (ref 70–99)
Glucose-Capillary: 192 mg/dL — ABNORMAL HIGH (ref 70–99)
Glucose-Capillary: 180 mg/dL — ABNORMAL HIGH (ref 70–99)
Glucose-Capillary: 154 mg/dL — ABNORMAL HIGH (ref 70–99)
Glucose-Capillary: 158 mg/dL — ABNORMAL HIGH (ref 70–99)
Glucose-Capillary: 152 mg/dL — ABNORMAL HIGH (ref 70–99)
Glucose-Capillary: 153 mg/dL — ABNORMAL HIGH (ref 70–99)
Glucose-Capillary: 166 mg/dL — ABNORMAL HIGH (ref 70–99)
Glucose-Capillary: 153 mg/dL — ABNORMAL HIGH (ref 70–99)
Glucose-Capillary: 142 mg/dL — ABNORMAL HIGH (ref 70–99)
Glucose-Capillary: 203 mg/dL — ABNORMAL HIGH (ref 70–99)

## 2020-12-22 SURGERY — ANTERIOR LATERAL LUMBAR FUSION 1 LEVEL
Anesthesia: General

## 2020-12-22 MED ORDER — ONDANSETRON HCL 4 MG/2ML IJ SOLN
INTRAMUSCULAR | Status: AC
  Filled 2020-12-22: qty 2

## 2020-12-22 MED ORDER — LIDOCAINE-EPINEPHRINE 1 %-1:100000 IJ SOLN
INTRAMUSCULAR | Status: DC | PRN
  Administered 2020-12-22: 5 mL

## 2020-12-22 MED ORDER — GABAPENTIN 100 MG PO CAPS: 100.0000 mg | ORAL_CAPSULE | Freq: Every day | ORAL | Status: DC

## 2020-12-22 MED ORDER — INSULIN REGULAR(HUMAN) IN NACL 100-0.9 UT/100ML-% IV SOLN
INTRAVENOUS | Status: AC
  Administered 2020-12-22: 5.5 [IU]/h via INTRAVENOUS
  Filled 2020-12-22: qty 100

## 2020-12-22 MED ORDER — KETOTIFEN FUMARATE 0.025 % OP SOLN: 1.0000 [drp] | Freq: Every day | OPHTHALMIC | Status: DC | PRN

## 2020-12-22 MED ORDER — METHOCARBAMOL 1000 MG/10ML IJ SOLN
500.0000 mg | Freq: Four times a day (QID) | INTRAVENOUS | Status: DC | PRN
  Filled 2020-12-22: qty 5

## 2020-12-22 MED ORDER — HYDROCHLOROTHIAZIDE 12.5 MG PO CAPS
12.5000 mg | ORAL_CAPSULE | Freq: Every day | ORAL | Status: DC
  Administered 2020-12-23 – 2020-12-24 (×2): 12.5 mg via ORAL
  Filled 2020-12-22 (×2): qty 1

## 2020-12-22 MED ORDER — FENTANYL CITRATE (PF) 100 MCG/2ML IJ SOLN: 25.0000 ug | INTRAMUSCULAR | Status: DC | PRN

## 2020-12-22 MED ORDER — EPHEDRINE SULFATE-NACL 50-0.9 MG/10ML-% IV SOSY
PREFILLED_SYRINGE | INTRAVENOUS | Status: DC | PRN
  Administered 2020-12-22: 10 mg via INTRAVENOUS
  Administered 2020-12-22 (×2): 5 mg via INTRAVENOUS
  Administered 2020-12-22: 10 mg via INTRAVENOUS

## 2020-12-22 MED ORDER — ACETAMINOPHEN 650 MG RE SUPP: 650.0000 mg | RECTAL | Status: DC | PRN

## 2020-12-22 MED ORDER — APREPITANT 40 MG PO CAPS
40.0000 mg | ORAL_CAPSULE | Freq: Once | ORAL | Status: AC
  Administered 2020-12-22: 40 mg via ORAL
  Filled 2020-12-22: qty 1

## 2020-12-22 MED ORDER — PHENYLEPHRINE HCL-NACL 20-0.9 MG/250ML-% IV SOLN
INTRAVENOUS | Status: DC | PRN
  Administered 2020-12-22: 20 ug/min via INTRAVENOUS

## 2020-12-22 MED ORDER — BUPIVACAINE HCL (PF) 0.5 % IJ SOLN
INTRAMUSCULAR | Status: AC
  Filled 2020-12-22: qty 30

## 2020-12-22 MED ORDER — MEPERIDINE HCL 50 MG PO TABS
50.0000 mg | ORAL_TABLET | Freq: Four times a day (QID) | ORAL | Status: DC | PRN
  Filled 2020-12-22: qty 1

## 2020-12-22 MED ORDER — CEFAZOLIN SODIUM-DEXTROSE 2-4 GM/100ML-% IV SOLN
2.0000 g | Freq: Three times a day (TID) | INTRAVENOUS | Status: AC
  Administered 2020-12-23: 2 g via INTRAVENOUS
  Filled 2020-12-22 (×2): qty 100

## 2020-12-22 MED ORDER — MEPERIDINE HCL 25 MG/ML IJ SOLN
12.5000 mg | Freq: Four times a day (QID) | INTRAMUSCULAR | Status: DC | PRN
  Filled 2020-12-22: qty 1

## 2020-12-22 MED ORDER — INSULIN ASPART 100 UNIT/ML IJ SOLN
3.0000 [IU] | Freq: Once | INTRAMUSCULAR | Status: AC
  Administered 2020-12-22: 3 [IU] via SUBCUTANEOUS

## 2020-12-22 MED ORDER — ALBUMIN HUMAN 5 % IV SOLN: INTRAVENOUS | Status: DC | PRN

## 2020-12-22 MED ORDER — INSULIN ASPART 100 UNIT/ML IJ SOLN
0.0000 [IU] | Freq: Three times a day (TID) | INTRAMUSCULAR | Status: DC
  Administered 2020-12-23: 4 [IU] via SUBCUTANEOUS
  Administered 2020-12-23: 11 [IU] via SUBCUTANEOUS

## 2020-12-22 MED ORDER — DEXAMETHASONE SODIUM PHOSPHATE 10 MG/ML IJ SOLN
INTRAMUSCULAR | Status: DC | PRN
  Administered 2020-12-22: 4 mg via INTRAVENOUS

## 2020-12-22 MED ORDER — EPHEDRINE 5 MG/ML INJ
INTRAVENOUS | Status: AC
  Filled 2020-12-22: qty 5

## 2020-12-22 MED ORDER — BUPIVACAINE HCL (PF) 0.5 % IJ SOLN
INTRAMUSCULAR | Status: DC | PRN
  Administered 2020-12-22: 5 mL
  Administered 2020-12-22: 25 mL

## 2020-12-22 MED ORDER — LIDOCAINE 2% (20 MG/ML) 5 ML SYRINGE
INTRAMUSCULAR | Status: AC
  Filled 2020-12-22: qty 5

## 2020-12-22 MED ORDER — LIDOCAINE-EPINEPHRINE 1 %-1:100000 IJ SOLN
INTRAMUSCULAR | Status: AC
  Filled 2020-12-22: qty 1

## 2020-12-22 MED ORDER — LISINOPRIL-HYDROCHLOROTHIAZIDE 20-12.5 MG PO TABS: 2.0000 | ORAL_TABLET | Freq: Every day | ORAL | Status: DC

## 2020-12-22 MED ORDER — LIDOCAINE 2% (20 MG/ML) 5 ML SYRINGE
INTRAMUSCULAR | Status: DC | PRN
  Administered 2020-12-22: 60 mg via INTRAVENOUS

## 2020-12-22 MED ORDER — MEPERIDINE HCL 50 MG PO TABS: 50.0000 mg | ORAL_TABLET | ORAL | Status: DC | PRN

## 2020-12-22 MED ORDER — ACETAMINOPHEN 500 MG PO TABS
1000.0000 mg | ORAL_TABLET | Freq: Three times a day (TID) | ORAL | Status: DC
  Administered 2020-12-22 – 2020-12-24 (×5): 1000 mg via ORAL
  Filled 2020-12-22 (×5): qty 2

## 2020-12-22 MED ORDER — ORAL CARE MOUTH RINSE: 15.0000 mL | Freq: Once | OROMUCOSAL | Status: DC

## 2020-12-22 MED ORDER — DIPHENHYDRAMINE HCL 50 MG/ML IJ SOLN
INTRAMUSCULAR | Status: DC | PRN
  Administered 2020-12-22: 25 mg via INTRAVENOUS

## 2020-12-22 MED ORDER — 0.9 % SODIUM CHLORIDE (POUR BTL) OPTIME
TOPICAL | Status: DC | PRN
  Administered 2020-12-22: 1000 mL

## 2020-12-22 MED ORDER — LISINOPRIL 20 MG PO TABS
20.0000 mg | ORAL_TABLET | Freq: Every day | ORAL | Status: DC
  Administered 2020-12-23 – 2020-12-24 (×2): 20 mg via ORAL
  Filled 2020-12-22 (×2): qty 1

## 2020-12-22 MED ORDER — POLYETHYLENE GLYCOL 3350 17 G PO PACK: 17.0000 g | PACK | Freq: Every day | ORAL | Status: DC | PRN

## 2020-12-22 MED ORDER — DIPHENHYDRAMINE HCL 50 MG/ML IJ SOLN
INTRAMUSCULAR | Status: AC
  Filled 2020-12-22: qty 1

## 2020-12-22 MED ORDER — FENTANYL CITRATE (PF) 100 MCG/2ML IJ SOLN
INTRAMUSCULAR | Status: DC | PRN
  Administered 2020-12-22 (×7): 50 ug via INTRAVENOUS
  Administered 2020-12-22: 25 ug via INTRAVENOUS
  Administered 2020-12-22: 50 ug via INTRAVENOUS

## 2020-12-22 MED ORDER — ACETAMINOPHEN 10 MG/ML IV SOLN: 1000.0000 mg | Freq: Once | INTRAVENOUS | Status: DC

## 2020-12-22 MED ORDER — DEXAMETHASONE SODIUM PHOSPHATE 10 MG/ML IJ SOLN
INTRAMUSCULAR | Status: AC
  Filled 2020-12-22: qty 1

## 2020-12-22 MED ORDER — PROPOFOL 10 MG/ML IV BOLUS
INTRAVENOUS | Status: DC | PRN
  Administered 2020-12-22: 120 mg via INTRAVENOUS

## 2020-12-22 MED ORDER — FLUTICASONE FUROATE-VILANTEROL 200-25 MCG/INH IN AEPB
1.0000 | INHALATION_SPRAY | Freq: Every day | RESPIRATORY_TRACT | Status: DC
  Filled 2020-12-22: qty 28

## 2020-12-22 MED ORDER — LABETALOL HCL 5 MG/ML IV SOLN
INTRAVENOUS | Status: DC | PRN
  Administered 2020-12-22: 5 mg via INTRAVENOUS
  Administered 2020-12-22: 10 mg via INTRAVENOUS
  Administered 2020-12-22: 5 mg via INTRAVENOUS

## 2020-12-22 MED ORDER — SIMVASTATIN 20 MG PO TABS
40.0000 mg | ORAL_TABLET | Freq: Every day | ORAL | Status: DC
  Administered 2020-12-23: 40 mg via ORAL
  Filled 2020-12-22: qty 2

## 2020-12-22 MED ORDER — SUGAMMADEX SODIUM 200 MG/2ML IV SOLN
INTRAVENOUS | Status: DC | PRN
  Administered 2020-12-22: 200 mg via INTRAVENOUS

## 2020-12-22 MED ORDER — SODIUM CHLORIDE 0.9 % IV SOLN: 250.0000 mL | INTRAVENOUS | Status: DC

## 2020-12-22 MED ORDER — THROMBIN 5000 UNITS EX SOLR
OROMUCOSAL | Status: DC | PRN
  Administered 2020-12-22: 5 mL via TOPICAL

## 2020-12-22 MED ORDER — DOCUSATE SODIUM 100 MG PO CAPS
100.0000 mg | ORAL_CAPSULE | Freq: Two times a day (BID) | ORAL | Status: DC
  Administered 2020-12-22 – 2020-12-24 (×4): 100 mg via ORAL
  Filled 2020-12-22 (×4): qty 1

## 2020-12-22 MED ORDER — LACTATED RINGERS IV SOLN
INTRAVENOUS | Status: DC | PRN
Start: 2020-12-22 — End: 2020-12-22

## 2020-12-22 MED ORDER — PHENOL 1.4 % MT LIQD: 1.0000 | OROMUCOSAL | Status: DC | PRN

## 2020-12-22 MED ORDER — SENNA 8.6 MG PO TABS
1.0000 | ORAL_TABLET | Freq: Two times a day (BID) | ORAL | Status: DC
  Administered 2020-12-22 – 2020-12-24 (×4): 8.6 mg via ORAL
  Filled 2020-12-22 (×4): qty 1

## 2020-12-22 MED ORDER — ONDANSETRON HCL 4 MG/2ML IJ SOLN: 4.0000 mg | Freq: Four times a day (QID) | INTRAMUSCULAR | Status: DC | PRN

## 2020-12-22 MED ORDER — CHLORHEXIDINE GLUCONATE 0.12 % MT SOLN: 15.0000 mL | Freq: Once | OROMUCOSAL | Status: DC

## 2020-12-22 MED ORDER — CHLORHEXIDINE GLUCONATE 0.12 % MT SOLN
OROMUCOSAL | Status: AC
  Administered 2020-12-22: 15 mL
  Filled 2020-12-22: qty 15

## 2020-12-22 MED ORDER — DULOXETINE HCL 30 MG PO CPEP
60.0000 mg | ORAL_CAPSULE | Freq: Every day | ORAL | Status: DC
  Administered 2020-12-23 – 2020-12-24 (×2): 60 mg via ORAL
  Filled 2020-12-22 (×2): qty 2

## 2020-12-22 MED ORDER — ALBUTEROL SULFATE (2.5 MG/3ML) 0.083% IN NEBU: 3.0000 mL | INHALATION_SOLUTION | RESPIRATORY_TRACT | Status: DC | PRN

## 2020-12-22 MED ORDER — SODIUM CHLORIDE 0.9% FLUSH
3.0000 mL | Freq: Two times a day (BID) | INTRAVENOUS | Status: DC
  Administered 2020-12-23 (×2): 3 mL via INTRAVENOUS

## 2020-12-22 MED ORDER — CHLORHEXIDINE GLUCONATE CLOTH 2 % EX PADS: 6.0000 | MEDICATED_PAD | Freq: Once | CUTANEOUS | Status: DC

## 2020-12-22 MED ORDER — KETAMINE HCL 50 MG/5ML IJ SOSY
PREFILLED_SYRINGE | INTRAMUSCULAR | Status: AC
  Filled 2020-12-22: qty 5

## 2020-12-22 MED ORDER — EMPAGLIFLOZIN 25 MG PO TABS
25.0000 mg | ORAL_TABLET | Freq: Every day | ORAL | Status: DC
  Administered 2020-12-23 – 2020-12-24 (×2): 25 mg via ORAL
  Filled 2020-12-22 (×3): qty 1

## 2020-12-22 MED ORDER — ALUM & MAG HYDROXIDE-SIMETH 200-200-20 MG/5ML PO SUSP: 30.0000 mL | Freq: Four times a day (QID) | ORAL | Status: DC | PRN

## 2020-12-22 MED ORDER — METHOCARBAMOL 500 MG PO TABS
500.0000 mg | ORAL_TABLET | Freq: Four times a day (QID) | ORAL | Status: DC | PRN
  Administered 2020-12-22 – 2020-12-24 (×3): 500 mg via ORAL
  Filled 2020-12-22 (×3): qty 1

## 2020-12-22 MED ORDER — VANCOMYCIN HCL IN DEXTROSE 1-5 GM/200ML-% IV SOLN
1000.0000 mg | INTRAVENOUS | Status: AC
  Administered 2020-12-22 (×2): 1000 mg via INTRAVENOUS
  Filled 2020-12-22: qty 200

## 2020-12-22 MED ORDER — LACTATED RINGERS IV SOLN: INTRAVENOUS | Status: DC

## 2020-12-22 MED ORDER — PROPOFOL 10 MG/ML IV BOLUS
INTRAVENOUS | Status: AC
  Filled 2020-12-22: qty 40

## 2020-12-22 MED ORDER — ACETAMINOPHEN 325 MG PO TABS
650.0000 mg | ORAL_TABLET | ORAL | Status: DC | PRN
Start: 2020-12-22 — End: 2020-12-22
  Administered 2020-12-22: 650 mg via ORAL
  Filled 2020-12-22: qty 2

## 2020-12-22 MED ORDER — BISACODYL 10 MG RE SUPP: 10.0000 mg | Freq: Every day | RECTAL | Status: DC | PRN

## 2020-12-22 MED ORDER — IBUPROFEN 200 MG PO TABS
400.0000 mg | ORAL_TABLET | Freq: Two times a day (BID) | ORAL | Status: DC | PRN
  Administered 2020-12-23 – 2020-12-24 (×2): 400 mg via ORAL
  Filled 2020-12-22 (×2): qty 2

## 2020-12-22 MED ORDER — FENTANYL CITRATE (PF) 250 MCG/5ML IJ SOLN
INTRAMUSCULAR | Status: AC
  Filled 2020-12-22: qty 5

## 2020-12-22 MED ORDER — HYDROMORPHONE HCL 1 MG/ML IJ SOLN: 1.0000 mg | INTRAMUSCULAR | Status: DC | PRN

## 2020-12-22 MED ORDER — THROMBIN 5000 UNITS EX SOLR
CUTANEOUS | Status: AC
  Filled 2020-12-22: qty 5000

## 2020-12-22 MED ORDER — FLEET ENEMA 7-19 GM/118ML RE ENEM: 1.0000 | ENEMA | Freq: Once | RECTAL | Status: DC | PRN

## 2020-12-22 MED ORDER — MENTHOL 3 MG MT LOZG: 1.0000 | LOZENGE | OROMUCOSAL | Status: DC | PRN

## 2020-12-22 MED ORDER — ONDANSETRON HCL 4 MG/2ML IJ SOLN
INTRAMUSCULAR | Status: DC | PRN
  Administered 2020-12-22 (×2): 4 mg via INTRAVENOUS

## 2020-12-22 MED ORDER — METOPROLOL SUCCINATE ER 100 MG PO TB24
100.0000 mg | ORAL_TABLET | Freq: Every day | ORAL | Status: DC
  Administered 2020-12-23 – 2020-12-24 (×2): 100 mg via ORAL
  Filled 2020-12-22 (×2): qty 1

## 2020-12-22 MED ORDER — SODIUM CHLORIDE 0.9% FLUSH: 3.0000 mL | INTRAVENOUS | Status: DC | PRN

## 2020-12-22 MED ORDER — ROCURONIUM BROMIDE 10 MG/ML (PF) SYRINGE
PREFILLED_SYRINGE | INTRAVENOUS | Status: DC | PRN
  Administered 2020-12-22 (×2): 50 mg via INTRAVENOUS

## 2020-12-22 MED ORDER — ROCURONIUM BROMIDE 10 MG/ML (PF) SYRINGE
PREFILLED_SYRINGE | INTRAVENOUS | Status: AC
  Filled 2020-12-22: qty 10

## 2020-12-22 MED ORDER — MIDAZOLAM HCL 2 MG/2ML IJ SOLN
INTRAMUSCULAR | Status: DC | PRN
  Administered 2020-12-22: 2 mg via INTRAVENOUS

## 2020-12-22 MED ORDER — LORAZEPAM 1 MG PO TABS
1.0000 mg | ORAL_TABLET | Freq: Two times a day (BID) | ORAL | Status: DC | PRN
  Administered 2020-12-22: 1 mg via ORAL
  Filled 2020-12-22: qty 1

## 2020-12-22 MED ORDER — OXYCODONE HCL 5 MG PO TABS
5.0000 mg | ORAL_TABLET | ORAL | Status: DC | PRN
  Filled 2020-12-22: qty 1

## 2020-12-22 MED ORDER — MIDAZOLAM HCL 2 MG/2ML IJ SOLN
INTRAMUSCULAR | Status: AC
  Filled 2020-12-22: qty 2

## 2020-12-22 MED ORDER — ARTIFICIAL TEARS OPHTHALMIC OINT
TOPICAL_OINTMENT | OPHTHALMIC | Status: AC
  Filled 2020-12-22: qty 7

## 2020-12-22 MED ORDER — GABAPENTIN 300 MG PO CAPS
300.0000 mg | ORAL_CAPSULE | Freq: Two times a day (BID) | ORAL | Status: DC
  Administered 2020-12-22 – 2020-12-24 (×4): 300 mg via ORAL
  Filled 2020-12-22 (×4): qty 1

## 2020-12-22 MED ORDER — MEPERIDINE HCL 25 MG/ML IJ SOLN: 12.5000 mg | INTRAMUSCULAR | Status: DC | PRN

## 2020-12-22 MED ORDER — ONDANSETRON HCL 4 MG PO TABS: 4.0000 mg | ORAL_TABLET | Freq: Four times a day (QID) | ORAL | Status: DC | PRN

## 2020-12-22 MED ORDER — PANTOPRAZOLE SODIUM 40 MG PO TBEC
40.0000 mg | DELAYED_RELEASE_TABLET | Freq: Every day | ORAL | Status: DC
  Administered 2020-12-23 – 2020-12-24 (×2): 40 mg via ORAL
  Filled 2020-12-22 (×2): qty 1

## 2020-12-22 MED ORDER — GLIPIZIDE 5 MG PO TABS
5.0000 mg | ORAL_TABLET | Freq: Two times a day (BID) | ORAL | Status: DC
  Administered 2020-12-23 – 2020-12-24 (×3): 5 mg via ORAL
  Filled 2020-12-22 (×3): qty 1

## 2020-12-22 MED ORDER — KETAMINE HCL-SODIUM CHLORIDE 100-0.9 MG/10ML-% IV SOSY
PREFILLED_SYRINGE | INTRAVENOUS | Status: DC | PRN
  Administered 2020-12-22 (×2): 10 mg via INTRAVENOUS
  Administered 2020-12-22: 30 mg via INTRAVENOUS

## 2020-12-22 SURGICAL SUPPLY — 85 items
ADH SKN CLS APL DERMABOND .7 (GAUZE/BANDAGES/DRESSINGS) ×1
BAG COUNTER SPONGE SURGICOUNT (BAG) ×4 IMPLANT
BAG SPNG CNTER NS LX DISP (BAG) ×1
BIT DRILL LONG 3.0X30 (BIT) IMPLANT
BIT DRILL LONG 3X80 (BIT) IMPLANT
BIT DRILL LONG 4X80 (BIT) IMPLANT
BIT DRILL SHORT 3.0X30 (BIT) IMPLANT
BIT DRILL SHORT 3X80 (BIT) IMPLANT
BLADE BN FN 3.2XSTRL LF (MISCELLANEOUS) IMPLANT
BLADE BONE MILL FINE (MISCELLANEOUS) ×2
BLADE CLIPPER SURG (BLADE) IMPLANT
BLADE SURG 11 STRL SS (BLADE) ×1 IMPLANT
BONE CANC CHIPS 20CC PCAN1/4 (Bone Implant) ×2 IMPLANT
BUR MATCHSTICK NEURO 3.0 LAGG (BURR) ×1 IMPLANT
CEMENT KYPHON C01A KIT/MIXER (Cement) ×1 IMPLANT
CHIPS CANC BONE 20CC PCAN1/4 (Bone Implant) ×1 IMPLANT
CLIP NEUROVISION LG (CLIP) ×1 IMPLANT
CNTNR URN SCR LID CUP LEK RST (MISCELLANEOUS) ×1 IMPLANT
CONT SPEC 4OZ STRL OR WHT (MISCELLANEOUS) ×2
COVER BACK TABLE 60X90IN (DRAPES) ×2 IMPLANT
DERMABOND ADVANCED (GAUZE/BANDAGES/DRESSINGS) ×1
DERMABOND ADVANCED .7 DNX12 (GAUZE/BANDAGES/DRESSINGS) ×3 IMPLANT
DEVICE DISSECT PLASMABLAD 3.0S (MISCELLANEOUS) IMPLANT
DRAPE C-ARM 42X72 X-RAY (DRAPES) ×3 IMPLANT
DRAPE C-ARMOR (DRAPES) ×3 IMPLANT
DRAPE LAPAROTOMY 100X72X124 (DRAPES) ×3 IMPLANT
DRAPE SHEET LG 3/4 BI-LAMINATE (DRAPES) ×2 IMPLANT
DRSG OPSITE POSTOP 4X8 (GAUZE/BANDAGES/DRESSINGS) ×1 IMPLANT
DURAPREP 26ML APPLICATOR (WOUND CARE) ×3 IMPLANT
ELECT BLADE 4.0 EZ CLEAN MEGAD (MISCELLANEOUS)
ELECT REM PT RETURN 9FT ADLT (ELECTROSURGICAL) ×2
ELECTRODE BLDE 4.0 EZ CLN MEGD (MISCELLANEOUS) IMPLANT
ELECTRODE REM PT RTRN 9FT ADLT (ELECTROSURGICAL) ×2 IMPLANT
GAUZE 4X4 16PLY ~~LOC~~+RFID DBL (SPONGE) IMPLANT
GLOVE EXAM NITRILE XL STR (GLOVE) IMPLANT
GLOVE SURG LTX SZ8.5 (GLOVE) ×3 IMPLANT
GLOVE SURG UNDER POLY LF SZ8.5 (GLOVE) ×3 IMPLANT
GOWN STRL REUS W/ TWL LRG LVL3 (GOWN DISPOSABLE) IMPLANT
GOWN STRL REUS W/ TWL XL LVL3 (GOWN DISPOSABLE) ×3 IMPLANT
GOWN STRL REUS W/TWL 2XL LVL3 (GOWN DISPOSABLE) ×6 IMPLANT
GOWN STRL REUS W/TWL LRG LVL3 (GOWN DISPOSABLE)
GOWN STRL REUS W/TWL XL LVL3 (GOWN DISPOSABLE) ×4
GRAFT BNE CANC CHIPS 1-8 20CC (Bone Implant) IMPLANT
GRAFT BONE PROTEIOS LRG 5CC (Orthopedic Implant) ×1 IMPLANT
GUIDEWIRE NITINOL BEVEL TIP (WIRE) ×6 IMPLANT
HEMOSTAT POWDER KIT SURGIFOAM (HEMOSTASIS) ×1 IMPLANT
IMPL TLX20 7X11X26 20D (Cage) IMPLANT
KIT BASIN OR (CUSTOM PROCEDURE TRAY) ×3 IMPLANT
KIT SPINE MAZOR X ROBO DISP (MISCELLANEOUS) ×2 IMPLANT
KIT TURNOVER KIT B (KITS) ×2 IMPLANT
MARKER SKIN DUAL TIP RULER LAB (MISCELLANEOUS) ×2 IMPLANT
MODULE NVM5 NEXT GEN EMG (NEEDLE) ×1 IMPLANT
NDL HYPO 25X1 1.5 SAFETY (NEEDLE) ×2 IMPLANT
NDL RELINE-OR FENS 16 (NEEDLE) IMPLANT
NEEDLE HYPO 25X1 1.5 SAFETY (NEEDLE) ×2 IMPLANT
NEEDLE RELINE-OR FENS 16 (NEEDLE) ×12 IMPLANT
NS IRRIG 1000ML POUR BTL (IV SOLUTION) ×5 IMPLANT
PACK LAMINECTOMY NEURO (CUSTOM PROCEDURE TRAY) ×3 IMPLANT
PAD ARMBOARD 7.5X6 YLW CONV (MISCELLANEOUS) ×6 IMPLANT
PIN HEAD 2.5X60MM (PIN) IMPLANT
PLASMABLADE 3.0S (MISCELLANEOUS) ×2
PUSHER RELINE FENS 36 OR (MISCELLANEOUS) ×6 IMPLANT
ROD RELINE-O 5.5X300 STRT NS (Rod) IMPLANT
ROD RELINE-O 5.5X300MM STRT (Rod) ×4 IMPLANT
SCREW LOCK RELINE 5.5 TULIP (Screw) ×10 IMPLANT
SCREW RELINE 4.5X40 2FC POLY (Screw) ×6 IMPLANT
SCREW RELINE PA 2FC 5.5X45 (Screw) ×4 IMPLANT
SCREW SCHANZ SA 4.0MM (MISCELLANEOUS) IMPLANT
SPONGE SURGIFOAM ABS GEL SZ50 (HEMOSTASIS) IMPLANT
SPONGE T-LAP 4X18 ~~LOC~~+RFID (SPONGE) ×2 IMPLANT
SUT VIC AB 0 CT1 18XCR BRD8 (SUTURE) IMPLANT
SUT VIC AB 0 CT1 8-18 (SUTURE) ×2
SUT VIC AB 1 CT1 18XBRD ANBCTR (SUTURE) ×1 IMPLANT
SUT VIC AB 1 CT1 8-18 (SUTURE) ×2
SUT VIC AB 2-0 CP2 18 (SUTURE) ×1 IMPLANT
SUT VIC AB 3-0 SH 8-18 (SUTURE) ×4 IMPLANT
SUT VIC AB 4-0 RB1 18 (SUTURE) ×7 IMPLANT
TAPE CLOTH 4X10 WHT NS (GAUZE/BANDAGES/DRESSINGS) ×3 IMPLANT
TIP FENS REPLACE RELINE (MISCELLANEOUS) ×6 IMPLANT
TLX20 IMPLANT 7X11X26 20D (Cage) ×2 IMPLANT
TOWEL GREEN STERILE (TOWEL DISPOSABLE) ×4 IMPLANT
TOWEL GREEN STERILE FF (TOWEL DISPOSABLE) ×4 IMPLANT
TRAY FOLEY MTR SLVR 16FR STAT (SET/KITS/TRAYS/PACK) ×3 IMPLANT
TUBE MAZOR SA REDUCTION (TUBING) ×2 IMPLANT
WATER STERILE IRR 1000ML POUR (IV SOLUTION) ×3 IMPLANT

## 2020-12-22 NOTE — Transfer of Care (Signed)
Immediate Anesthesia Transfer of Care Note  Patient: [name]  Procedure(s) Performed: Thoracic twelve to Lumbar one posterior lateral lumbar interbody fusion with pedicle screw fixation Thoracic ten to Lumbar two with robot, posterior arthrodesis with allograft from Thoracic ten to Lumbar two APPLICATION OF ROBOTIC ASSISTANCE FOR SPINAL PROCEDURE  Patient Location: PACU  Anesthesia Type:General  Level of Consciousness: drowsy and patient cooperative  Airway & Oxygen Therapy: Patient connected to face mask oxygen  Post-op Assessment: Report given to RN and Post -op Vital signs reviewed and stable  Post vital signs: Reviewed and stable  Last Vitals:  Vitals Value Taken Time  BP 173/62 12/22/20 1500  Temp    Pulse 81 12/22/20 1503  Resp 18 12/22/20 1503  SpO2 96 % 12/22/20 1503  Vitals shown include unvalidated device data.  Last Pain:  Vitals:   12/22/20 0613  TempSrc:   PainSc: 8       Patients Stated Pain Goal: 3 ([id number])  Complications: No notable events documented.

## 2020-12-22 NOTE — Progress Notes (Signed)
CBG is 203 at this time. No s/s noted. Notified the on call neuro surgeon to see if patient can get a sliding scale or one time dose of insulin. Awaiting for a response. Will continue to monitor.

## 2020-12-22 NOTE — Anesthesia Procedure Notes (Signed)
Arterial Line Insertion Start/End8/18/2022 8:55 AM, 12/22/2020 9:00 AM Performed by: Roderic Palau, MD  Patient location: OR. Preanesthetic checklist: patient identified, IV checked, site marked, risks and benefits discussed, surgical consent, monitors and equipment checked, pre-op evaluation, timeout performed and anesthesia consent Lidocaine 1% used for infiltration Left, radial was placed Catheter size: 20 G Hand hygiene performed , maximum sterile barriers used  and Seldinger technique used  Attempts: 1 Procedure performed without using ultrasound guided technique. Following insertion, dressing applied and Biopatch. Post procedure assessment: normal and unchanged  Post procedure complications: unsuccessful attempts and second provider assisted. Patient tolerated the procedure well with no immediate complications.

## 2020-12-22 NOTE — Anesthesia Postprocedure Evaluation (Signed)
Anesthesia Post Note  Patient: [name]  Procedure(s) Performed: Thoracic twelve to Lumbar one posterior lateral lumbar interbody fusion with pedicle screw fixation Thoracic ten to Lumbar two with robot, posterior arthrodesis with allograft from Thoracic ten to Lumbar two APPLICATION OF ROBOTIC ASSISTANCE FOR SPINAL PROCEDURE     Patient location during evaluation: PACU Anesthesia Type: General Level of consciousness: awake and alert Pain management: pain level controlled Vital Signs Assessment: post-procedure vital signs reviewed and stable Respiratory status: spontaneous breathing, nonlabored ventilation, respiratory function stable and patient connected to nasal cannula oxygen Cardiovascular status: blood pressure returned to baseline and stable Postop Assessment: no apparent nausea or vomiting Anesthetic complications: no   No notable events documented.  Last Vitals:  Vitals:   12/22/20 1530 12/22/20 1545  BP: (!) 133/52 (!) 140/55  Pulse: 80 78  Resp: 12 10  Temp:    SpO2: 96% 97%    Last Pain:  Vitals:   12/22/20 1530  TempSrc:   PainSc: 0-No pain                 Derl Abalos,W. EDMOND

## 2020-12-22 NOTE — Progress Notes (Signed)
Spoke with the patient @ [phone]

## 2020-12-22 NOTE — Op Note (Signed)
Date of surgery: 12/22/2020 Preoperative diagnosis: Thoracic spondylosis with myelopathy T12-L1.  History of fusion L1-L4. Postoperative diagnosis: Same Procedure: Posterior interbody arthrodesis with decompression of T12-L1 with more work than required for simple interbody technique.  Pedicle screw fixation from T10 L2 with robotic assistance in placing pedicle screws neuro monitoring fluoroscopic guidance and methacrylate augmentation of pedicle screws and T10-T11-T12.  The posterolateral arthrodesis T10-L2.  Local autograft allograft, and Proteus. Surgeon: Kristeen Miss First Assistant: Earle Gell, MD Anesthesia: General endotracheal Indications: [name] is a 70 year old individuals had progressive back pain with leg pain and weakness.  She has a spondylitic ridge at the T12-L1 level with a chronically calcified disc herniation causing cord compression on the left side.  She was advised regarding surgery to decompress and stabilize this region.  Procedure: The patient was brought to the operating room supine on the stretcher.  After the smooth induction of general tracheal anesthesia she was placed prone onto the [facility] table.  Monitoring of the lower extremity musculature was performed with EMG and the robotic arm was affixed to the table.  The back was prepped with alcohol DuraPrep and draped in a sterile fashion and then a midline incision was created from the area of the previous surgery at L2 cephalad to the area approximating T8 10.  20 dissection was carried through the thoracodorsal fascia the fascia was opened on either side of midline and a subperiosteal exposure was performed from T10 down to L2.  Once all the bony exposure was completed it was noted that the L1 spinous process had largely been removed T12 was skeletonized.  The robotic arm was attached to the T12 spinous process with a Clamp and registration radiographs were obtained.  A preoperative plan which was reviewed and  approved to place K wires and T10-T11 and T12 was then performed and.  Through an open approach K wires were placed in T10-T11-T12 with the robotic arm.  Then 4.5 x 45 mm screws were placed in T10-T11-T12.  This was done with neural monitoring and no evidence of any cut out was noted.  With that being performed the robotic arm was disassembled.  Methacrylate augmentation was then performed using fluoroscopic guidance and 1 cc of methacrylate was injected into each pedicle screw teeth 10 T11 and T12.  With this being completed a decompression was then performed at the T12-L1 interspace from the left side.  Hemilaminectomy was created on the side and the common dural tube was exposed.  The area of the disc base was then exposed and there was noted to be a significant calcific mass over the superior aspect of the vertebral body of L1.  This was carefully chipped away using a combination of curettes rongeurs and osteotomes.  Once the bone growth was largely removed the common dural tube was noted to be well decompressed.  The disc base was then entered and a discectomy was performed via transforaminal approach at the T12-L1 level.  An interbody distractor was then used to distract the space and ultimately was felt that the expandable cage measuring 7 x 11 x 26 mm could be placed into the T12-L1 space.  This cage was then expanded approximately 50% at site.  Prior to doing this 6 cc of bone graft was packed into the interspace and additional 3 cc of bone graft was packed after the cage was expanded.  Once this was completed the screws and rod combination in L1 and L2 that have been placed at a previous surgery  were removed and NuVasive screws were placed of the same size and caliber.  A straight rod measuring 160 mm was then contoured to fit between the saddles from T10-L2.  This was tightened in a neutral construct on both sides.  Final radiographs identified good position of the hardware and good overall reduction of  the interspace at T12-L1 level.  The posterior and lateral aspects of the vertebrae were then decorticated with an additional 25 cc of bone graft which was a combination of autograft allograft and Proteus was laid into the posterolateral gutters from T10-L2.  Once the bone graft was well placed hemostasis was checked and the wound was carefully closed with #1 Vicryl in the thoracodorsal fascia 2-0 Vicryl in the subcutaneous tissues and 25 cc of 1% Marcaine was injected into the paraspinous fascia and then final closure was performed with 3-0 and 4-0 Vicryl in the subcuticular and subcutaneous fashions.  Dermabond was used on the skin and blood loss was estimated approximately 300 cc.  No Cell Saver blood was recovered from this patient.  Patient was then returned to recovery room in stable condition.

## 2020-12-22 NOTE — H&P (Signed)
[name] is an 70 y.o. female.   Chief Complaint: Back pain bilateral lower extremity pain.  Weakness in the lower extremities.  Inability to walk distances. HPI: [name] is a 69 year old individual whose had extensive lumbar spondylosis in the past.  He has had decompression and fusion at L1-2.  Prior to that he she has had decompression and fusion at L2-3 and 3 4.  She is also had a spondylolisthesis at L5-S1 noted in operated on years ago.  After each surgery she is done reasonably well for a period of time but here recently she has had increasing pain in her back with lack of stamina in her feet to walk any distance.  She has type 2 diabetes.  Recent studies demonstrate that she has degeneration of the T12-L1 interspace with a large posterior osteophyte on the left side.  There is severe degenerative changes in the disc space and she has been advised regarding surgical decompression and stabilization across the thoracolumbar junction to the level of T10 this will be done with robotic screw placement and pedicles at T10-T11-T12.  She is now admitted for this procedure.  Past Medical History:  Diagnosis Date   Anemia in chronic renal disease 06/05/2011   Anxiety    takes Ativan tid   Asthma    has an inhaler prn   Chronic kidney disease    Cough    DDD (degenerative disc disease), cervical 06/05/2011   DDD (degenerative disc disease), lumbosacral 06/05/2011   Depression    takes Cymbalta daily   Diabetes mellitus    takes Amaryl and Metformin daily   Diabetic neuropathy (HCC)    Diverticulosis    Dizziness    GERD (gastroesophageal reflux disease)    takes Protonix prn   Headache(784.0)    occasionally   History of bronchitis    last time 58yr ago   History of colon polyps    History of MRSA infection    HTN (hypertension) 06/05/2011   takes Prinizide daily   Hyperlipidemia    takes Zocor daily   Insomnia    not on any meds at present time   Osteoarthritis    PONV  (postoperative nausea and vomiting)    hard to wake up   Psoriasis 06/05/2011   on legs and uses cream prn   Seasonal allergies    but doesn't take any meds   Sleep apnea    uses CPAP   Tubular adenoma of colon     Past Surgical History:  Procedure Laterality Date   ABDOMINAL HYSTERECTOMY  1982 and 1984   AMPUTATION  02/12/2012   Procedure: AMPUTATION RAY;  Surgeon: JWylene Simmer MD;  [facility]  Service: Orthopedics;  Laterality: Right;  RIGHT 2ND TOE AMPUTATION    BACK SURGERY     x 3   CARDIAC CATHETERIZATION     08/09/04: 50% mid AV groove CX lesion, NL LM, LAD, Ramus, EF > 60% (Dr. [place]lyn [place]   [facility] [place]   [facility]  COLONOSCOPY  05/30/2009, 09/30/13   [facility] 2011/Dr.Brodie 2015   DILATION AND CURETTAGE OF UTERUS     ELBOW SURGERY     left d/t tendonitis   ESOPHAGOGASTRODUODENOSCOPY     LUMBAR WOUND DEBRIDEMENT N/A 07/15/2012   Procedure: I & D of Lumbar Wound: Possible Repair of CSF Leak;  Surgeon: GElaina Hoops MD;  [facility];  Service: Neurosurgery;  Laterality: N/A;  I & D of Lumbar Wound: Possible Repair of CSF Leak placement lumbar drain   POLYPECTOMY     right knee arthroscopy     x 2   SHOULDER ARTHROSCOPY Left    TOE AMPUTATION Left     Family History  Problem Relation Age of Onset   Colon cancer Brother 65   Hypertension Mother        died from infection s/p hip surgery   Heart attack Father    Colon cancer Maternal Grandfather    Stomach cancer Neg Hx    Esophageal cancer Neg Hx    Rectal cancer Neg Hx    Liver cancer Neg Hx    Colon polyps Neg Hx    Social History:  reports that she has never smoked. She has never used smokeless tobacco. She reports that she does not drink alcohol and does not use drugs.  Allergies:  Allergies  Allergen Reactions   Victoza [Liraglutide] Hives and Rash   Hydromorphone Hives, Diarrhea, Swelling and Rash   Ultram [Tramadol] Rash    Acetaminophen     Pt was told not to take   Betadine [Povidone Iodine] Other (See Comments)    Skin peels   Bydureon [Exenatide] Diarrhea and Nausea And Vomiting   Codeine Other (See Comments)    Severe headaches   Contrast Media [Iodinated Diagnostic Agents] Nausea And Vomiting   Humira [Adalimumab] Hives   Hydrocodone Other (See Comments)    headache   Invokana [Canagliflozin] Other (See Comments)    Cannot take this 2/2 PAD and toe amputations   Iodine Hives   Januvia [Sitagliptin] Diarrhea   Morphine And Related Nausea And Vomiting   Ozempic (0.25 Or 0.5 Mg-Dose) [Semaglutide(0.25 Or 0.'5mg'$ -Dos)] Nausea And Vomiting   Percocet [Oxycodone-Acetaminophen] Other (See Comments)    Headache    Prednisone Diarrhea and Nausea And Vomiting   Sulfa Antibiotics Itching   Valium Other (See Comments)    headache   Amoxicillin Hives and Rash    Did it involve swelling of the face/tongue/throat, SOB, or low BP? Unknown Did it involve sudden or severe rash/hives, skin peeling, or any reaction on the inside of your mouth or nose? Yes Did you need to seek medical attention at a hospital or doctor's office? Yes When did it last happen?      unk If all above answers are "NO", may proceed with cephalosporin use.    Darvocet [Propoxyphene N-Acetaminophen] Rash and Other (See Comments)    headache   Fentanyl And Related Rash   Wellbutrin [Bupropion Hcl] Hives and Rash    Medications Prior to Admission  Medication Sig Dispense Refill   aspirin 81 MG tablet Take 81 mg by mouth daily.     Cholecalciferol (VITAMIN D) 50 MCG (2000 UT) tablet Take 2,000 Units by mouth daily.     clobetasol (OLUX) 0.05 % topical foam Apply 1 application topically daily.     COLLAGEN PO Take 2 capsules by mouth daily.     DULoxetine (CYMBALTA) 30 MG capsule Take 30-60 mg by mouth See admin instructions. Take 60 mg in the morning and 30 mg  5   empagliflozin (JARDIANCE) 25 MG TABS tablet Take 1 tablet (25 mg total) by  mouth daily. 90 tablet 3   gabapentin (NEURONTIN) 100 MG capsule Take 100 mg by mouth at bedtime.     glipiZIDE (GLUCOTROL) 5 MG tablet TAKE 1 TABLET(5 MG) BY MOUTH TWICE DAILY BEFORE A MEAL (Patient taking  differently: Take 5 mg by mouth 2 (two) times daily before a meal.) 180 tablet 1   insulin aspart (NOVOLOG) 100 UNIT/ML injection Inject 7-10 Units into the skin 3 (three) times daily before meals. (Patient taking differently: Inject 10 Units into the skin 3 (three) times daily before meals.) 30 mL 3   insulin degludec (TRESIBA FLEXTOUCH) 200 UNIT/ML FlexTouch Pen Inject 52 units into the skin daily (Patient taking differently: Inject 52 Units into the skin at bedtime.) 18 mL 2   Ketotifen Fumarate (ALLERGY EYE DROPS OP) Place 1 drop into both eyes daily as needed (allergies).     lisinopril-hydrochlorothiazide (ZESTORETIC) 20-12.5 MG tablet Take 2 tablets by mouth daily.     LORazepam (ATIVAN) 1 MG tablet Take 1 mg by mouth 2 (two) times daily as needed for anxiety.     metoprolol succinate (TOPROL-XL) 100 MG 24 hr tablet Take 100 mg by mouth daily. Take with or immediately following a meal.     Multiple Vitamins-Minerals (MULTIVITAMIN WITH MINERALS) tablet Take 1 tablet by mouth daily.      pantoprazole (PROTONIX) 40 MG tablet Take 40 mg by mouth daily.     Probiotic Product (PROBIOTIC DAILY PO) Take 1 capsule by mouth daily.      simvastatin (ZOCOR) 40 MG tablet Take 40 mg by mouth daily in the afternoon.     triamcinolone (KENALOG) 0.025 % ointment Apply 1 application topically 2 (two) times daily. (Patient taking differently: Apply 1 application topically 2 (two) times daily as needed (toe ulcer).) 30 g 0   albuterol (PROVENTIL HFA;VENTOLIN HFA) 108 (90 BASE) MCG/ACT inhaler Inhale 1 puff into the lungs every 4 (four) hours as needed for wheezing or shortness of breath.      budesonide-formoterol (SYMBICORT) 160-4.5 MCG/ACT inhaler Inhale 1 puff into the lungs 2 (two) times daily as needed  (shortness of breath).     clindamycin (CLEOCIN) 300 MG capsule Take 1 capsule (300 mg total) by mouth 3 (three) times daily. (Patient not taking: No sig reported) 21 capsule 0   doxycycline (VIBRA-TABS) 100 MG tablet Take 1 tablet (100 mg total) by mouth 2 (two) times daily. (Patient not taking: No sig reported) 20 tablet 0   glucose blood (ONETOUCH VERIO) test strip TEST TWICE DAILY 200 strip 11   hydrocortisone cream 1 % Apply 1 application topically 2 (two) times daily as needed (rash).     Insulin Pen Needle 31G X 5 MM MISC by Does not apply route.     Insulin Pen Needle 32G X 5 MM MISC by Does not apply route.     Insulin Syringe-Needle U-100 (INSULIN SYRINGE 1CC/31GX5/16") 31G X 5/16" 1 ML MISC Use 4 syringes per day to inject insulin 120 each 5   Lancets (ONETOUCH ULTRASOFT) lancets      Needle, Disp, 32G X 5/16" MISC by Does not apply route.     ondansetron (ZOFRAN) 8 MG tablet Take 8 mg by mouth every 8 (eight) hours as needed for nausea or vomiting.      Results for orders placed or performed during the hospital encounter of 12/22/20 (from the past 48 hour(s))  Glucose, capillary     Status: Abnormal   Collection Time: 12/22/20  5:59 AM  Result Value Ref Range   Glucose-Capillary 212 (H) 70 - 99 mg/dL    Comment: Glucose reference range applies only to samples taken after fasting for at least 8 hours.   No results found.  Review of Systems  Constitutional:  Positive for activity change.  HENT: Negative.    Eyes: Negative.   Respiratory: Negative.    Cardiovascular: Negative.   Gastrointestinal: Negative.   Endocrine: Negative.   Genitourinary: Negative.   Musculoskeletal:  Positive for back pain and gait problem.  Allergic/Immunologic: Negative.   Neurological:  Positive for weakness.  Hematological: Negative.   Psychiatric/Behavioral: Negative.     Blood pressure (!) 158/66, pulse 71, temperature 98.1 F (36.7 C), temperature source Oral, resp. rate 18, height '5\' 3"'$   (1.6 m), weight 103 kg, SpO2 97 %. Physical Exam Constitutional:      Appearance: Normal appearance. She is obese.  HENT:     Head: Normocephalic and atraumatic.     Right Ear: Tympanic membrane normal.     Left Ear: Tympanic membrane normal.     Nose: Nose normal.     Mouth/Throat:     Mouth: Mucous membranes are moist.  Eyes:     Extraocular Movements: Extraocular movements intact.     Pupils: Pupils are equal, round, and reactive to light.  Cardiovascular:     Rate and Rhythm: Normal rate and regular rhythm.  Pulmonary:     Effort: Pulmonary effort is normal.     Breath sounds: Normal breath sounds.  Abdominal:     General: Abdomen is flat.     Palpations: Abdomen is soft.  Musculoskeletal:     Cervical back: Normal range of motion and neck supple.     Comments: Positive straight leg raising bilaterally at 45 degrees.  Tenderness to palpation in the thoracolumbar junction tenderness to percussion in the thoracolumbar junction  Skin:    General: Skin is dry.     Capillary Refill: Capillary refill takes less than 2 seconds.  Neurological:     Mental Status: She is alert.     Comments: Alert oriented cranial nerve examination is normal upper extremity strength and reflexes were normal in the lower extremities she has some modest weakness in the tibialis anterior bilaterally and has a moderately wide-based gait.  Gastroc strength appears intact but reflexes are absent in the Achilles patellar reflexes are 1+.  Straight leg raising is positive at 45 degrees bilaterally.  Patrick's maneuver is negative.  Psychiatric:        Mood and Affect: Mood normal.        Behavior: Behavior normal.        Thought Content: Thought content normal.        Judgment: Judgment normal.     Assessment/Plan Spondylosis with stenosis T12-L1.  History of fusion L1-L4.  Posterior lateral arthrodesis and decompression of T12-L1.  Fixation from T10-L2 with robotic assistance pedicle screw placement  T10-T11-T12 and L1.  Augmentation with methacrylate.  Earleen Newport, MD 12/22/2020, 7:55 AM

## 2020-12-22 NOTE — Anesthesia Procedure Notes (Signed)
Procedure Name: Intubation Date/Time: 12/22/2020 8:53 AM Performed by: Genelle Bal, CRNA Pre-anesthesia Checklist: Patient identified, Emergency Drugs available, Suction available and Patient being monitored Patient Re-evaluated:Patient Re-evaluated prior to induction Oxygen Delivery Method: Circle system utilized Preoxygenation: Pre-oxygenation with 100% oxygen Induction Type: IV induction Ventilation: Mask ventilation without difficulty and Oral airway inserted - appropriate to patient size Laryngoscope Size: Sabra Heck and 2 Grade View: Grade I Tube type: Oral Tube size: 7.0 mm Number of attempts: 1 Airway Equipment and Method: Stylet and Oral airway Placement Confirmation: ETT inserted through vocal cords under direct vision, positive ETCO2 and breath sounds checked- equal and bilateral Secured at: 21 cm Tube secured with: Tape Dental Injury: Teeth and Oropharynx as per pre-operative assessment

## 2020-12-22 NOTE — Progress Notes (Signed)
Orthopedic Tech Progress Note Patient Details:  [name] [birth year] [id number]  Left in room Ortho Devices Ortho Device/Splint Location: TLSO Ortho Device/Splint Interventions: Ordered      [name] A Alfonso Shackett 12/22/2020, 5:37 PM

## 2020-12-23 ENCOUNTER — Encounter (HOSPITAL_COMMUNITY): Payer: Self-pay | Admitting: Neurological Surgery

## 2020-12-23 LAB — GLUCOSE, CAPILLARY
Glucose-Capillary: 172 mg/dL — ABNORMAL HIGH (ref 70–99)
Glucose-Capillary: 278 mg/dL — ABNORMAL HIGH (ref 70–99)
Glucose-Capillary: 295 mg/dL — ABNORMAL HIGH (ref 70–99)
Glucose-Capillary: 188 mg/dL — ABNORMAL HIGH (ref 70–99)

## 2020-12-23 LAB — CBC
HCT: 27.8 % — ABNORMAL LOW (ref 36.0–46.0)
Hemoglobin: 9.2 g/dL — ABNORMAL LOW (ref 12.0–15.0)
MCH: 31.5 pg (ref 26.0–34.0)
MCHC: 33.1 g/dL (ref 30.0–36.0)
MCV: 95.2 fL (ref 80.0–100.0)
Platelets: 218 10*3/uL (ref 150–400)
RBC: 2.92 MIL/uL — ABNORMAL LOW (ref 3.87–5.11)
RDW: 12.6 % (ref 11.5–15.5)
WBC: 14.9 10*3/uL — ABNORMAL HIGH (ref 4.0–10.5)
nRBC: 0 % (ref 0.0–0.2)

## 2020-12-23 LAB — BASIC METABOLIC PANEL
Anion gap: 6 (ref 5–15)
BUN: 40 mg/dL — ABNORMAL HIGH (ref 8–23)
CO2: 26 mmol/L (ref 22–32)
Calcium: 8.7 mg/dL — ABNORMAL LOW (ref 8.9–10.3)
Chloride: 103 mmol/L (ref 98–111)
Creatinine, Ser: 1.39 mg/dL — ABNORMAL HIGH (ref 0.44–1.00)
GFR, Estimated: 42 mL/min — ABNORMAL LOW (ref >60)
Glucose, Bld: 202 mg/dL — ABNORMAL HIGH (ref 70–99)
Potassium: 4.9 mmol/L (ref 3.5–5.1)
Sodium: 135 mmol/L (ref 135–145)

## 2020-12-23 MED ORDER — INSULIN ASPART 100 UNIT/ML IJ SOLN: 0.0000 [IU] | Freq: Every day | INTRAMUSCULAR | Status: DC

## 2020-12-23 MED ORDER — INSULIN ASPART 100 UNIT/ML IJ SOLN
0.0000 [IU] | Freq: Three times a day (TID) | INTRAMUSCULAR | Status: DC
  Administered 2020-12-23: 11 [IU] via SUBCUTANEOUS
  Administered 2020-12-24: 4 [IU] via SUBCUTANEOUS

## 2020-12-23 MED ORDER — INSULIN ASPART 100 UNIT/ML IJ SOLN
4.0000 [IU] | Freq: Three times a day (TID) | INTRAMUSCULAR | Status: DC
  Administered 2020-12-23 – 2020-12-24 (×2): 4 [IU] via SUBCUTANEOUS

## 2020-12-23 MED ORDER — INSULIN GLARGINE-YFGN 100 UNIT/ML ~~LOC~~ SOLN
25.0000 [IU] | Freq: Every day | SUBCUTANEOUS | Status: DC
  Administered 2020-12-23: 25 [IU] via SUBCUTANEOUS
  Filled 2020-12-23 (×2): qty 0.25

## 2020-12-23 NOTE — Evaluation (Signed)
Physical Therapy Evaluation Patient Details Name: [name] MRN: [id number] DOB: May 26, 1950 Today's Date: 12/23/2020   History of Present Illness  he pt is a 70 yo female presenting 8/18 for T12-L1 posterior lateral interbody fusion with T10-L2 fixation due to spondylosis with myelopathy. PMH includes: extensive lumbar surgical history (L1-2, L2-3, L3-4, and L5-S1), anxiety, toe amputations depression, CKD, DM II with neuropathy, HTN, and HLD.   Clinical Impression  Pt in bed upon arrival of PT, agreeable to evaluation at this time. Prior to admission the pt was mobilizing with use of SPC for short distances in the home, and reports modI with use of adaptive equipment for ADLs. The pt now presents with limitations in functional mobility, endurance, power, and dynamic stability due to above dx and pain, and will continue to benefit from skilled PT to address these deficits. The pt was able to follow all cues for log roll and adhere to spinal precautions well with movement, but required minA to complete sit-stand transfers and short bouts of gait in the room at this time. The pt's mobility was limited by lack of appropriately fitting brace (TLSO present but too small to fit the pt). The pt will benefit from additional visit with focus on progressing ambulation distance and stair training once brace arrives. Anticipate she will be safe to return home with family.      Follow Up Recommendations Outpatient PT;Supervision for mobility/OOB (OPPT for core strengthening when cleared by MD)    Equipment Recommendations  None recommended by PT    Recommendations for Other Services       Precautions / Restrictions Precautions Precautions: Back;Fall Precaution Booklet Issued: Yes (comment) Precaution Comments: reviewed verbally Required Braces or Orthoses: Spinal Brace Spinal Brace: Thoracolumbosacral orthotic;Applied in sitting position (current brace does not fit, called ortho techs for  replacement) Restrictions Weight Bearing Restrictions: No      Mobility  Bed Mobility Overal bed mobility: Needs Assistance Bed Mobility: Sit to Sidelying Rolling: Supervision Sidelying to sit: Min assist     Sit to sidelying: Min guard General bed mobility comments: VC for correct technique; good return demonstration    Transfers Overall transfer level: Needs assistance Equipment used: Rolling walker (2 wheeled) Transfers: Sit to/from Stand Sit to Stand: Min guard         General transfer comment: minA to power up, cues for hand placement  Ambulation/Gait Ambulation/Gait assistance: Min assist Gait Distance (Feet): 15 Feet Assistive device: Rolling walker (2 wheeled) Gait Pattern/deviations: Step-to pattern;Decreased stride length;Trunk flexed Gait velocity: decreased   General Gait Details: short, shuffling steps with minimal clearance, cues for posture. no instances of buckling. limited to 15 ft to bathroom due to no fitting brace present     Balance Overall balance assessment: Mild deficits observed, not formally tested Sitting-balance support: No upper extremity supported;Feet supported Sitting balance-Leahy Scale: Fair     Standing balance support: Bilateral upper extremity supported;During functional activity Standing balance-Leahy Scale: Fair Standing balance comment: able to static stand without BUE support, BUE support for gait                             Pertinent Vitals/Pain Pain Assessment: 0-10 Pain Score: 4  Pain Location: incision, middle of back Pain Descriptors / Indicators: Discomfort Pain Intervention(s): Limited activity within patient's tolerance    Home Living Family/patient expects to be discharged to:: Private residence Living Arrangements: Spouse/significant other Available Help at Discharge: Family;Available 24 hours/day  Type of Home: House Home Access: Stairs to enter Entrance Stairs-Rails: Right Entrance  Stairs-Number of Steps: 2 Home Layout: One level Home Equipment: Cane - quad;Shower seat;Hand held shower head;Adaptive equipment      Prior Function Level of Independence: Independent with assistive device(s)         Comments: still driving, limited by pain, using cane     Hand Dominance   Dominant Hand: Right    Extremity/Trunk Assessment   Upper Extremity Assessment Upper Extremity Assessment: Overall WFL for tasks assessed (B hand numbness at basleine)    Lower Extremity Assessment Lower Extremity Assessment: Defer to PT evaluation (Numbness B feet; PN) RLE Deficits / Details: pt reports needing knee surgery on R knee, grossly 4/5 but no instances of buckling. Pt with numbness and tingling into RLE prior to surgery, no sensory deficits at this time RLE Sensation: WNL RLE Coordination: WNL    Cervical / Trunk Assessment Cervical / Trunk Assessment: Other exceptions (multiple surgeries) Cervical / Trunk Exceptions: s/p sopinal surgery  Communication   Communication: No difficulties  Cognition Arousal/Alertness: Awake/alert Behavior During Therapy: WFL for tasks assessed/performed Overall Cognitive Status: Within Functional Limits for tasks assessed                                        General Comments General comments (skin integrity, edema, etc.): Pt is diabetic and doe not regularly check the plantar aspect of her feet. Husband present adn educated on importance of checking the bottom of her feet daily. Pt/husband verbalized understanding. Also discussed imortacne of good shoe wear with larger toe box.    Exercises     Assessment/Plan    PT Assessment Patient needs continued PT services  PT Problem List Decreased strength;Decreased range of motion;Decreased mobility;Decreased activity tolerance;Decreased balance;Decreased knowledge of use of DME;Decreased knowledge of precautions;Pain       PT Treatment Interventions DME instruction;Stair  training;Gait training;Functional mobility training;Therapeutic activities;Therapeutic exercise;Balance training;Patient/family education    PT Goals (Current goals can be found in the Care Plan section)  Acute Rehab PT Goals Patient Stated Goal: to go home PT Goal Formulation: With patient Time For Goal Achievement: 12/30/20 Potential to Achieve Goals: Good    Frequency Min 5X/week    AM-PAC PT "6 Clicks" Mobility  Outcome Measure Help needed turning from your back to your side while in a flat bed without using bedrails?: A Little Help needed moving from lying on your back to sitting on the side of a flat bed without using bedrails?: A Little Help needed moving to and from a bed to a chair (including a wheelchair)?: A Little Help needed standing up from a chair using your arms (e.g., wheelchair or bedside chair)?: A Little Help needed to walk in hospital room?: A Little Help needed climbing 3-5 steps with a railing? : A Little 6 Click Score: 18    End of Session Equipment Utilized During Treatment: Gait belt Activity Tolerance: Patient tolerated treatment well Patient left:  (in bathroom with OT) Nurse Communication: Mobility status (brace does not fit) PT Visit Diagnosis: Other abnormalities of gait and mobility (R26.89);Muscle weakness (generalized) (M62.81);Pain    Time: [id number] PT Time Calculation (min) (ACUTE ONLY): 25 min   Charges:   PT Evaluation $PT Eval Low Complexity: 1 Low PT Treatments $Therapeutic Activity: 8-22 mins        Inocencio Homes, PT, DPT  Acute Rehabilitation Department Pager #: [phone]  Sandra Cockayne 12/23/2020, 10:27 AM

## 2020-12-23 NOTE — Progress Notes (Signed)
Patient ID: [name], female   DOB: January 15, 1951, 70 y.o.   MRN: [id number] Patient's vital signs are stable Postoperative laboratories demonstrate hematocrit down to 27 White count is normal Postop creatinine is 1.39 but this is actually better than it was preoperatively at 1.50.  Blood sugar running around 200.  We will last diabetic coordinator to see regarding diabetic control.  Patient notes her pain seems well controlled with the Tylenol at this time.  She does have multiple drug allergies.  She responded to Demerol a couple of years ago and this has been written but she has not used it yet.  She has not ambulated at this time.  The incision is clean and dry and the dressing is intact.  She will likely be able to be discharged tomorrow.  I will sign her out to Dr. Saintclair Halsted who will see her in my absence.  Overall her motor function in the lower extremities feels good.  We will see how she does with ambulation.

## 2020-12-23 NOTE — Progress Notes (Signed)
Occupational Therapy Evaluation Patient Details Name: [name] MRN: [id number] DOB: 1950/09/29 Today's Date: 12/23/2020    History of Present Illness he pt is a 70 yo female presenting 8/18 for T12-L1 posterior lateral interbody fusion with T10-L2 fixation due to spondylosis with myelopathy. PMH includes: extensive lumbar surgical history (L1-2, L2-3, L3-4, and L5-S1), anxiety, toe amputations depression, CKD, DM II with neuropathy, HTN, and HLD.   Clinical Impression   PTA pt modified independent with ADL and mobility and IADL tasks with use of AE and cane. Pt's husband works but will be able to stay with her after DC and assist as needed. Pt's TLSO doe not fit properly - PT is calling ortho tech to further assess proper fit. Will follow acutely to educate on back precautions and use of AE to help with LB ADL and pericare after toileting.     Follow Up Recommendations  No OT follow up    Equipment Recommendations  Other (comment) (toilet rails - educated pt/husband on availability)    Recommendations for Other Services       Precautions / Restrictions Precautions Precautions: Back;Fall Precaution Booklet Issued: Yes (comment) Precaution Comments: reviewed verbally Required Braces or Orthoses: Spinal Brace Spinal Brace: Thoracolumbosacral orthotic;Applied in sitting position (current brace does not fit, called ortho techs for replacement) Restrictions Weight Bearing Restrictions: No      Mobility Bed Mobility Overal bed mobility: Needs Assistance Bed Mobility: Sit to Sidelying Rolling: Supervision      Sit to sidelying: Min guard General bed mobility comments: VC for correct technique; good return demonstration    Transfers Overall transfer level: Needs assistance Equipment used: Rolling walker (2 wheeled) Transfers: Sit to/from Stand Sit to Stand: Min guard         General transfer comment: minA to power up, cues for hand placement    Balance Overall  balance assessment: Mild deficits observed, not formally tested Sitting-balance support: No upper extremity supported;Feet supported Sitting balance-Leahy Scale: Fair     Standing balance support: Bilateral upper extremity supported;During functional activity Standing balance-Leahy Scale: Fair Standing balance comment: able to static stand without BUE support, BUE support for gait                           ADL either performed or assessed with clinical judgement   ADL Overall ADL's : Needs assistance/impaired     Grooming: Set up;Supervision/safety;Standing   Upper Body Bathing: Set up;Supervision/ safety;Sitting   Lower Body Bathing: Moderate assistance;Sit to/from stand   Upper Body Dressing : Set up;Sitting Upper Body Dressing Details (indicate cue type and reason): brace does not fit Lower Body Dressing: Moderate assistance;Sit to/from stand   Toilet Transfer: Minimal assistance;Ambulation;RW;Grab bars   Toileting- Clothing Manipulation and Hygiene: Moderate assistance;Sit to/from stand Toileting - Clothing Manipulation Details (indicate cue type and reason): May benefit form toilet tong     Functional mobility during ADLs: Minimal assistance;Rolling walker;Cueing for safety General ADL Comments: uses AE at baseline     Vision         Perception     Praxis      Pertinent Vitals/Pain Pain Assessment: 0-10 Pain Score: 4  Pain Location: incision, middle of back Pain Descriptors / Indicators: Discomfort Pain Intervention(s): Limited activity within patient's tolerance     Hand Dominance Right   Extremity/Trunk Assessment Upper Extremity Assessment Upper Extremity Assessment: Overall WFL for tasks assessed (B hand numbness at basleine)   Lower Extremity Assessment  Lower Extremity Assessment: Defer to PT evaluation (Numbness B feet; PN)   Cervical / Trunk Assessment Cervical / Trunk Assessment: Other exceptions (multiple surgeries) Cervical /  Trunk Exceptions: s/p multiple spinal surgeries   Communication Communication Communication: No difficulties   Cognition Arousal/Alertness: Awake/alert Behavior During Therapy: WFL for tasks assessed/performed Overall Cognitive Status: Within Functional Limits for tasks assessed                                     General Comments  Pt is diabetic and does not regularly check the plantar aspect of her feet. Husband present and educated on importance of checking the bottom of her feet daily. Pt/husband verbalized understanding. Also discussed importacne of good shoe wear with larger toe box.    Exercises     Shoulder Instructions      Home Living Family/patient expects to be discharged to:: Private residence Living Arrangements: Spouse/significant other Available Help at Discharge: Family;Available 24 hours/day Type of Home: House Home Access: Stairs to enter [employer] of Steps: 2 Entrance Stairs-Rails: Right Home Layout: One level     Bathroom Shower/Tub: Occupational psychologist: Handicapped height Bathroom Accessibility: Yes How Accessible: Accessible via walker Home Equipment: [place] - quad;Shower seat;Hand held shower head;Adaptive equipment Adaptive Equipment: Reacher;Sock aid;Long-handled shoe horn        Prior Functioning/Environment Level of Independence: Independent with assistive device(s)        Comments: still driving, limited by pain, using cane        OT Problem List: Decreased strength;Decreased range of motion;Impaired balance (sitting and/or standing);Decreased knowledge of use of DME or AE;Decreased knowledge of precautions;Obesity;Pain      OT Treatment/Interventions: Self-care/ADL training;DME and/or AE instruction;Therapeutic activities;Patient/family education    OT Goals(Current goals can be found in the care plan section) Acute Rehab OT Goals Patient Stated Goal: to go home OT Goal Formulation: With  patient/family Time For Goal Achievement: 01/06/21 Potential to Achieve Goals: Good  OT Frequency: Min 2X/week   Barriers to D/C:            Co-evaluation              AM-PAC OT "6 Clicks" Daily Activity     Outcome Measure Help from another person eating meals?: None Help from another person taking care of personal grooming?: A Little Help from another person toileting, which includes using toliet, bedpan, or urinal?: A Lot Help from another person bathing (including washing, rinsing, drying)?: A Lot Help from another person to put on and taking off regular upper body clothing?: A Little Help from another person to put on and taking off regular lower body clothing?: A Lot 6 Click Score: 16   End of Session Equipment Utilized During Treatment: Gait belt;Rolling walker Nurse Communication: Mobility status;Other (comment) (TLSO does not fit)  Activity Tolerance: Patient tolerated treatment well Patient left: in bed;with call bell/phone within reach;with family/visitor present;with bed alarm set  OT Visit Diagnosis: Unsteadiness on feet (R26.81);Muscle weakness (generalized) (M62.81);Pain;Other abnormalities of gait and mobility (R26.89) Pain - part of body:  (back)                Time: 0953-1010 OT Time Calculation (min): 17 min Charges:  OT General Charges $OT Visit: 1 Visit OT Evaluation $OT Eval Low Complexity: [place], OT/L   Acute OT Clinical Specialist [place] Pager [phone] Office [phone]  [place] 12/23/2020, 10:24 AM

## 2020-12-23 NOTE — Progress Notes (Signed)
Inpatient Diabetes Program Recommendations  AACE/ADA: New Consensus Statement on Inpatient Glycemic Control (2015)  Target Ranges:  Prepandial:   less than 140 mg/dL      Peak postprandial:   less than 180 mg/dL (1-2 hours)      Critically ill patients:  140 - 180 mg/dL   Lab Results  Component Value Date   GLUCAP 172 (H) 12/23/2020   HGBA1C 7.8 (H) 12/14/2020    Review of Glycemic Control  Diabetes history: DM2 Outpatient Diabetes medications: Tresiba 52 units q hs, Novolog 7-10 units tid meal coverage,Glucotrol 5 mg bid,Jardiance 25 mg qd Current orders for Inpatient glycemic control: Novolog 0-20 units tid, Jardiance 25 mg qd, Glucotrol 5 mg bid  Inpatient Diabetes Program Recommendations:   Received consult regarding diabetes management of patient. -Add Semglee 25 units qd (50% home basal insulin dose) -Add Novolog 0-5 units hs correction Secure chat sent to Dr. Ellene Route.  Thank you, Nani Gasser. Ilisa Hayworth, RN, MSN, CDE  Diabetes Coordinator Inpatient Glycemic Control Team Team Pager [phone] (8am-5pm) 12/23/2020 9:37 AM

## 2020-12-24 LAB — GLUCOSE, CAPILLARY: Glucose-Capillary: 161 mg/dL — ABNORMAL HIGH (ref 70–99)

## 2020-12-24 MED ORDER — METHOCARBAMOL 500 MG PO TABS: 500.0000 mg | ORAL_TABLET | Freq: Four times a day (QID) | ORAL | 0 refills | Status: DC

## 2020-12-24 NOTE — Progress Notes (Signed)
Pt discharged home. Pt's husband at bedside. All personal belongings packed, PIV removed, pt educated on all discharge information. [place] dressing clean, dry, intact, and pt educated to leave dressing on for an additional 48 hrs per order. Pt taken to personal vehicle in wheelchair by this RN.   Justice Rocher, RN

## 2020-12-24 NOTE — Discharge Summary (Signed)
Physician Discharge Summary  Patient ID: [name] MRN: [id number] DOB/AGE: 07-24-1950 70 y.o.  Admit date: 12/22/2020 Discharge date: 12/24/2020  Admission Diagnoses: Thoracic spondylosis with myelopathy T12-L1.  History of fusion L1    Discharge Diagnoses: same   Discharged Condition: good  Hospital Course: The patient was admitted on 12/22/2020 and taken to the operating room where the patient underwent posterior lateral arthrodesis and instrumentation T10-L2, PLIF T12-L1. The patient tolerated the procedure well and was taken to the recovery room and then to the floor in stable condition. The hospital course was routine. There were no complications. The wound remained clean dry and intact. Pt had appropriate back soreness. No complaints of leg pain or new N/T/W. The patient remained afebrile with stable vital signs, and tolerated a regular diet. The patient continued to increase activities, and pain was well controlled with oral pain medications.   Consults: None  Significant Diagnostic Studies:  Results for orders placed or performed during the hospital encounter of 12/22/20  Glucose, capillary  Result Value Ref Range   Glucose-Capillary 212 (H) 70 - 99 mg/dL  Glucose, capillary  Result Value Ref Range   Glucose-Capillary 192 (H) 70 - 99 mg/dL  Glucose, capillary  Result Value Ref Range   Glucose-Capillary 180 (H) 70 - 99 mg/dL  Glucose, capillary  Result Value Ref Range   Glucose-Capillary 154 (H) 70 - 99 mg/dL  Glucose, capillary  Result Value Ref Range   Glucose-Capillary 158 (H) 70 - 99 mg/dL  Glucose, capillary  Result Value Ref Range   Glucose-Capillary 152 (H) 70 - 99 mg/dL  Glucose, capillary  Result Value Ref Range   Glucose-Capillary 153 (H) 70 - 99 mg/dL  Glucose, capillary  Result Value Ref Range   Glucose-Capillary 166 (H) 70 - 99 mg/dL  Glucose, capillary  Result Value Ref Range   Glucose-Capillary 153 (H) 70 - 99 mg/dL  CBC  Result Value Ref  Range   WBC 14.9 (H) 4.0 - 10.5 K/uL   RBC 2.92 (L) 3.87 - 5.11 MIL/uL   Hemoglobin 9.2 (L) 12.0 - 15.0 g/dL   HCT 27.8 (L) 36.0 - 46.0 %   MCV 95.2 80.0 - 100.0 fL   MCH 31.5 26.0 - 34.0 pg   MCHC 33.1 30.0 - 36.0 g/dL   RDW 12.6 11.5 - 15.5 %   Platelets 218 150 - 400 K/uL   nRBC 0.0 0.0 - 0.2 %  Basic Metabolic Panel  Result Value Ref Range   Sodium 135 135 - 145 mmol/L   Potassium 4.9 3.5 - 5.1 mmol/L   Chloride 103 98 - 111 mmol/L   CO2 26 22 - 32 mmol/L   Glucose, Bld 202 (H) 70 - 99 mg/dL   BUN 40 (H) 8 - 23 mg/dL   Creatinine, Ser 1.39 (H) 0.44 - 1.00 mg/dL   Calcium 8.7 (L) 8.9 - 10.3 mg/dL   GFR, Estimated 42 (L) >60 mL/min   Anion gap 6 5 - 15  Glucose, capillary  Result Value Ref Range   Glucose-Capillary 142 (H) 70 - 99 mg/dL   Comment 1 Notify RN    Comment 2 Document in Chart   Glucose, capillary  Result Value Ref Range   Glucose-Capillary 203 (H) 70 - 99 mg/dL  Glucose, capillary  Result Value Ref Range   Glucose-Capillary 172 (H) 70 - 99 mg/dL  Glucose, capillary  Result Value Ref Range   Glucose-Capillary 278 (H) 70 - 99 mg/dL  Glucose, capillary  Result  Value Ref Range   Glucose-Capillary 295 (H) 70 - 99 mg/dL  Glucose, capillary  Result Value Ref Range   Glucose-Capillary 188 (H) 70 - 99 mg/dL  Glucose, capillary  Result Value Ref Range   Glucose-Capillary 161 (H) 70 - 99 mg/dL   Comment 1 Notify RN    Comment 2 Document in Chart     DG THORACOLUMABAR SPINE  Result Date: 12/22/2020 CLINICAL DATA:  Surgery, elective Z41.9 (ICD-10-CM). Additional history provided: T12-L1 TLIF. T10-L2 pedicle screws. Provided fluoroscopy time 41 seconds (35.29 mGy). EXAM: THORACOLUMBAR SPINE 1V COMPARISON:  CT of the thoracic spine 12/01/2020. FINDINGS: AP and lateral view intraoperative fluoroscopic images of the thoracic and lumbar spine are submitted, 2 images total. On the provided images, a posterior spinal fusion construct spans the T10-L2 levels (bilateral  pedicle screws and vertical interconnecting rods). An interbody device is now present at T12-L1. This interbody device is anterior in position and extends slightly beyond the ventral margins of the T12 and L1 vertebral bodies. Redemonstrated interbody devices at L1-L2 and L2-L3. IMPRESSION: Two intraoperative fluoroscopic images of the thoracic and lumbar spine demonstrating a posterior spinal fusion construct spanning the T10-L2 levels. And interbody device is now present at the T12-L1 level. This interbody device is somewhat anterior in position, extending slightly beyond the ventral margins of the T12 and L1 vertebral bodies. Electronically Signed   By: Kellie Simmering D.O.   On: 12/22/2020 14:03   CT THORACIC SPINE WO CONTRAST  Result Date: 12/02/2020 CLINICAL DATA:  Chronic bilateral low back pain with left-sided sciatica. Surgical planning. EXAM: CT THORACIC SPINE WITHOUT CONTRAST TECHNIQUE: Multidetector CT images of the thoracic were obtained using the standard protocol without intravenous contrast. COMPARISON:  Lumbar spine MRI 10/22/2020 FINDINGS: Alignment: Increased kyphosis at the thoracolumbar junction. Trace anterolisthesis of T1 on T2, T8 on T9, T9 on T10, and T10 on T11. Vertebrae: No acute fracture or suspicious osseous lesion. Paraspinal and other soft tissues: Aortic and coronary atherosclerosis. Cholecystectomy. Disc levels: Limited visualization of the lower cervical spine demonstrates solid interbody osseous fusion at C5-6 and C6-7. Posterior decompression and instrumented posterior and interbody fusion are again seen at L1-2, and there has also been prior posterior decompression and interbody fusion at L2-3, more fully evaluated on the prior lumbar MRI. There is advanced disc degeneration at T12-L1 with severe disc space narrowing, endplate sclerosis and spurring, small Schmorl's nodes, and vacuum disc. No endplate destruction is seen at this level to strongly suggest infectious  discitis-osteomyelitis. Prominent endplate osteophytes extending from the central to left extraforaminal regions and severe facet arthrosis result in asymmetric left lateral recess stenosis and moderate left neural foraminal stenosis. Moderate to severe disc degeneration is also present throughout much of the remainder of the thoracic spine, and there is interbody and facet ankylosis at T8-9. A calcified central disc extrusion is noted at T6-7 with mild inferior migration and no associated significant spinal stenosis. There is widespread thoracic facet arthrosis, and bulky facet spurring at T10-11 results in severe bilateral neural foraminal stenosis. IMPRESSION: 1. Thoracic spine CT for surgical planning. 2. Adjacent segment disease at T12-L1 with asymmetric left lateral recess and left neural foraminal stenosis. 3. Advanced multilevel disc and facet degeneration elsewhere with severe bilateral neural foraminal stenosis at T10-11. 4. Aortic Atherosclerosis (ICD10-I70.0). Electronically Signed   By: Logan Bores M.D.   On: 12/02/2020 16:01   DG Foot Complete Right  Result Date: 11/30/2020 Please see detailed radiograph report in office note.  DG C-Arm  1-60 Min  Result Date: 12/22/2020 CLINICAL DATA:  Surgery, elective Z41.9 (ICD-10-CM). Additional history provided: T12-L1 TLIF. T10-L2 pedicle screws. Provided fluoroscopy time 41 seconds (35.29 mGy). EXAM: THORACOLUMBAR SPINE 1V COMPARISON:  CT of the thoracic spine 12/01/2020. FINDINGS: AP and lateral view intraoperative fluoroscopic images of the thoracic and lumbar spine are submitted, 2 images total. On the provided images, a posterior spinal fusion construct spans the T10-L2 levels (bilateral pedicle screws and vertical interconnecting rods). An interbody device is now present at T12-L1. This interbody device is anterior in position and extends slightly beyond the ventral margins of the T12 and L1 vertebral bodies. Redemonstrated interbody devices at  L1-L2 and L2-L3. IMPRESSION: Two intraoperative fluoroscopic images of the thoracic and lumbar spine demonstrating a posterior spinal fusion construct spanning the T10-L2 levels. And interbody device is now present at the T12-L1 level. This interbody device is somewhat anterior in position, extending slightly beyond the ventral margins of the T12 and L1 vertebral bodies. Electronically Signed   By: Kellie Simmering D.O.   On: 12/22/2020 14:03    Antibiotics:  Anti-infectives (From admission, onward)    Start     Dose/Rate Route Frequency Ordered Stop   12/22/20 1800  ceFAZolin (ANCEF) IVPB 2g/100 mL premix        2 g 200 mL/hr over 30 Minutes Intravenous Every 8 hours 12/22/20 1714 12/23/20 0959   12/22/20 0600  vancomycin (VANCOCIN) IVPB 1000 mg/200 mL premix        1,000 mg 200 mL/hr over 60 Minutes Intravenous On call to O.R. 12/22/20 0543 12/22/20 0732       Discharge Exam: Blood pressure (!) 119/48, pulse 78, temperature 99.2 F (37.3 C), temperature source Oral, resp. rate 20, height '5\' 3"'$  (1.6 m), weight 103 kg, SpO2 91 %. Neurologic: Grossly normal Ambulating and voiding well, incision cdi   Discharge Medications:   Allergies as of 12/24/2020       Reactions   Victoza [liraglutide] Hives, Rash   Hydromorphone Hives, Diarrhea, Swelling, Rash   Ultram [tramadol] Rash   Acetaminophen    Pt was told not to take   Betadine [povidone Iodine] Other (See Comments)   Skin peels   Bydureon [exenatide] Diarrhea, Nausea And Vomiting   Chlorhexidine    Exacerbates psoriasis    Codeine Other (See Comments)   Severe headaches   Contrast Media [iodinated Diagnostic Agents] Nausea And Vomiting   Humira [adalimumab] Hives   Hydrocodone Other (See Comments)   headache   Invokana [canagliflozin] Other (See Comments)   Cannot take this 2/2 PAD and toe amputations   Iodine Hives   Januvia [sitagliptin] Diarrhea   Morphine And Related Nausea And Vomiting   Ozempic (0.25 Or 0.5 Mg-dose)  [semaglutide(0.25 Or 0.'5mg'$ -dos)] Nausea And Vomiting   Percocet [oxycodone-acetaminophen] Other (See Comments)   Headache   Prednisone Diarrhea, Nausea And Vomiting   Sulfa Antibiotics Itching   Valium Other (See Comments)   headache   Amoxicillin Hives, Rash   Did it involve swelling of the face/tongue/throat, SOB, or low BP? Unknown Did it involve sudden or severe rash/hives, skin peeling, or any reaction on the inside of your mouth or nose? Yes Did you need to seek medical attention at a hospital or doctor's office? Yes When did it last happen?      unk If all above answers are "NO", may proceed with cephalosporin use.   Darvocet [propoxyphene N-acetaminophen] Rash, Other (See Comments)   headache   Fentanyl And Related Rash  Wellbutrin [bupropion Hcl] Hives, Rash        Medication List     TAKE these medications    albuterol 108 (90 Base) MCG/ACT inhaler Commonly known as: VENTOLIN HFA Inhale 1 puff into the lungs every 4 (four) hours as needed for wheezing or shortness of breath.   ALLERGY EYE DROPS OP Place 1 drop into both eyes daily as needed (allergies).   aspirin 81 MG tablet Take 81 mg by mouth daily.   budesonide-formoterol 160-4.5 MCG/ACT inhaler Commonly known as: SYMBICORT Inhale 1 puff into the lungs 2 (two) times daily as needed (shortness of breath).   clindamycin 300 MG capsule Commonly known as: Cleocin Take 1 capsule (300 mg total) by mouth 3 (three) times daily.   clobetasol 0.05 % topical foam Commonly known as: OLUX Apply 1 application topically daily.   COLLAGEN PO Take 2 capsules by mouth daily.   doxycycline 100 MG tablet Commonly known as: VIBRA-TABS Take 1 tablet (100 mg total) by mouth 2 (two) times daily.   DULoxetine 30 MG capsule Commonly known as: CYMBALTA Take 30-60 mg by mouth See admin instructions. Take 60 mg in the morning and 30 mg   empagliflozin 25 MG Tabs tablet Commonly known as: Jardiance Take 1 tablet (25 mg  total) by mouth daily.   gabapentin 100 MG capsule Commonly known as: NEURONTIN Take 100 mg by mouth at bedtime.   glipiZIDE 5 MG tablet Commonly known as: GLUCOTROL TAKE 1 TABLET(5 MG) BY MOUTH TWICE DAILY BEFORE A MEAL What changed:  how much to take how to take this when to take this additional instructions   hydrocortisone cream 1 % Apply 1 application topically 2 (two) times daily as needed (rash).   insulin aspart 100 UNIT/ML injection Commonly known as: novoLOG Inject 7-10 Units into the skin 3 (three) times daily before meals. What changed: how much to take   Insulin Pen Needle 31G X 5 MM Misc by Does not apply route.   Insulin Pen Needle 32G X 5 MM Misc by Does not apply route.   INSULIN SYRINGE 1CC/31GX5/16" 31G X 5/16" 1 ML Misc Use 4 syringes per day to inject insulin   lisinopril-hydrochlorothiazide 20-12.5 MG tablet Commonly known as: ZESTORETIC Take 2 tablets by mouth daily.   LORazepam 1 MG tablet Commonly known as: ATIVAN Take 1 mg by mouth 2 (two) times daily as needed for anxiety.   methocarbamol 500 MG tablet Commonly known as: Robaxin Take 1 tablet (500 mg total) by mouth 4 (four) times daily.   metoprolol succinate 100 MG 24 hr tablet Commonly known as: TOPROL-XL Take 100 mg by mouth daily. Take with or immediately following a meal.   multivitamin with minerals tablet Take 1 tablet by mouth daily.   Needle (Disp) 32G X 5/16" Misc by Does not apply route.   ondansetron 8 MG tablet Commonly known as: ZOFRAN Take 8 mg by mouth every 8 (eight) hours as needed for nausea or vomiting.   onetouch ultrasoft lancets   OneTouch Verio test strip Generic drug: glucose blood TEST TWICE DAILY   pantoprazole 40 MG tablet Commonly known as: PROTONIX Take 40 mg by mouth daily.   PROBIOTIC DAILY PO Take 1 capsule by mouth daily.   simvastatin 40 MG tablet Commonly known as: ZOCOR Take 40 mg by mouth daily in the afternoon.   Tyler Aas  FlexTouch 200 UNIT/ML FlexTouch Pen Generic drug: insulin degludec Inject 52 units into the skin daily What changed:  how much  to take how to take this when to take this additional instructions   triamcinolone 0.025 % ointment Commonly known as: KENALOG Apply 1 application topically 2 (two) times daily. What changed:  when to take this reasons to take this   Vitamin D 50 MCG (2000 UT) tablet Take 2,000 Units by mouth daily.        Disposition: home   Final Dx: posterior lateral arthrodesis and instrumentation T10-L2, PLIF T12-L1  Discharge Instructions     Call MD for:  difficulty breathing, headache or visual disturbances   Complete by: As directed    Call MD for:  hives   Complete by: As directed    Call MD for:  persistant dizziness or light-headedness   Complete by: As directed    Call MD for:  persistant nausea and vomiting   Complete by: As directed    Call MD for:  redness, tenderness, or signs of infection (pain, swelling, redness, odor or green/yellow discharge around incision site)   Complete by: As directed    Call MD for:  severe uncontrolled pain   Complete by: As directed    Call MD for:  temperature >100.4   Complete by: As directed    Diet - low sodium heart healthy   Complete by: As directed    Driving Restrictions   Complete by: As directed    No driving for 2 weeks, no riding in the car for 1 week   Incentive spirometry RT   Complete by: As directed    Increase activity slowly   Complete by: As directed    Lifting restrictions   Complete by: As directed    No lifting more than 8 lbs   Remove dressing in 48 hours   Complete by: As directed           Signed: Ocie Cornfield Arriyanna Mersch 12/24/2020, 8:44 AM

## 2020-12-26 MED FILL — Sodium Chloride IV Soln 0.9%: INTRAVENOUS | Qty: 1000 | Status: AC

## 2020-12-26 MED FILL — Heparin Sodium (Porcine) Inj 1000 Unit/ML: INTRAMUSCULAR | Qty: 30 | Status: AC

## 2021-01-06 ENCOUNTER — Ambulatory Visit: Payer: HMO | Admitting: Internal Medicine

## 2021-01-13 DIAGNOSIS — M5442 Lumbago with sciatica, left side: Secondary | ICD-10-CM | POA: Diagnosis not present

## 2021-01-18 DIAGNOSIS — M25512 Pain in left shoulder: Secondary | ICD-10-CM | POA: Diagnosis not present

## 2021-02-07 ENCOUNTER — Ambulatory Visit: Payer: HMO | Admitting: Podiatry

## 2021-02-07 ENCOUNTER — Other Ambulatory Visit: Payer: Self-pay

## 2021-02-07 ENCOUNTER — Encounter: Payer: Self-pay | Admitting: Podiatry

## 2021-02-07 DIAGNOSIS — L84 Corns and callosities: Secondary | ICD-10-CM

## 2021-02-07 DIAGNOSIS — M79674 Pain in right toe(s): Secondary | ICD-10-CM | POA: Diagnosis not present

## 2021-02-07 DIAGNOSIS — E1149 Type 2 diabetes mellitus with other diabetic neurological complication: Secondary | ICD-10-CM

## 2021-02-07 DIAGNOSIS — B351 Tinea unguium: Secondary | ICD-10-CM

## 2021-02-07 DIAGNOSIS — Z89421 Acquired absence of other right toe(s): Secondary | ICD-10-CM | POA: Diagnosis not present

## 2021-02-07 DIAGNOSIS — Z89422 Acquired absence of other left toe(s): Secondary | ICD-10-CM

## 2021-02-07 DIAGNOSIS — M79675 Pain in left toe(s): Secondary | ICD-10-CM | POA: Diagnosis not present

## 2021-02-12 NOTE — Progress Notes (Signed)
Subjective: 70 y.o. returns the office today for painful, elongated, thickened toenails which she cannot trim herself. Denies any redness or drainage around the nails.  She denies any open lesions or any swelling or redness or any drainage.  Denies any systemic complaints such as fevers, chills, nausea, vomiting.    Recently had back surgery December 22, 2020 is been doing well.  PCP: Mayra Neer, MD  Objective: AAO 3, NAD DP/PT pulses palpable, CRT less than 3 seconds Protective sensation decreased with Simms Weinstein monofilament Nails hypertrophic, dystrophic, elongated, brittle, discolored 7. There is tenderness overlying the nails 3, 4, 5 of the left and 1, 3, 4, 5 on the right.  There is no surrounding erythema or drainage along the nail sites. Hyperkeratotic lesion at the distal edge of the right fourth toe as well as the left third toe.  They are preulcerative but upon debridement there is no underlying ulceration drainage or any signs of infection. Hemorrhage is present No other areas of tenderness bilateral lower extremities. No overlying edema, erythema, increased warmth. No pain with calf compression, swelling, warmth, erythema.  Assessment: Patient presents with symptomatic onychomycosis, pre ulcerative calluses  Plan: -Treatment options including alternatives, risks, complications were discussed -Nails sharply debrided 7 without complication/bleeding. -Hyperkeratotic lesion sharply debrided x2 without any complications or bleeding.  Offloading. -Discussed daily foot inspection. If there are any changes, to call the office immediately.  -Follow-up in 3 months or sooner if any problems are to arise. In the meantime, encouraged to call the office with any questions, concerns, changes symptoms.  Celesta Gentile, DPM

## 2021-02-15 DIAGNOSIS — M25512 Pain in left shoulder: Secondary | ICD-10-CM | POA: Diagnosis not present

## 2021-02-16 ENCOUNTER — Encounter: Payer: Self-pay | Admitting: Internal Medicine

## 2021-02-21 ENCOUNTER — Ambulatory Visit (INDEPENDENT_AMBULATORY_CARE_PROVIDER_SITE_OTHER): Payer: HMO | Admitting: Internal Medicine

## 2021-02-21 ENCOUNTER — Encounter: Payer: Self-pay | Admitting: Internal Medicine

## 2021-02-21 ENCOUNTER — Other Ambulatory Visit: Payer: Self-pay

## 2021-02-21 VITALS — BP 162/84 | HR 75 | Ht 63.0 in | Wt 233.4 lb

## 2021-02-21 DIAGNOSIS — E1159 Type 2 diabetes mellitus with other circulatory complications: Secondary | ICD-10-CM

## 2021-02-21 DIAGNOSIS — E669 Obesity, unspecified: Secondary | ICD-10-CM

## 2021-02-21 DIAGNOSIS — E1165 Type 2 diabetes mellitus with hyperglycemia: Secondary | ICD-10-CM | POA: Diagnosis not present

## 2021-02-21 DIAGNOSIS — Z794 Long term (current) use of insulin: Secondary | ICD-10-CM | POA: Diagnosis not present

## 2021-02-21 DIAGNOSIS — Z23 Encounter for immunization: Secondary | ICD-10-CM

## 2021-02-21 DIAGNOSIS — E785 Hyperlipidemia, unspecified: Secondary | ICD-10-CM

## 2021-02-21 HISTORY — DX: Type 2 diabetes mellitus with hyperglycemia: E11.65

## 2021-02-21 LAB — POCT GLYCOSYLATED HEMOGLOBIN (HGB A1C): Hemoglobin A1C: 8.5 % — AB (ref 4.0–5.6)

## 2021-02-21 MED ORDER — EMPAGLIFLOZIN 25 MG PO TABS: 25.0000 mg | ORAL_TABLET | Freq: Every day | ORAL | 3 refills | Status: DC

## 2021-02-21 MED ORDER — INSULIN ASPART 100 UNIT/ML IJ SOLN: 12.0000 [IU] | Freq: Three times a day (TID) | INTRAMUSCULAR | 3 refills | Status: DC

## 2021-02-21 MED ORDER — GLIPIZIDE 5 MG PO TABS: 5.0000 mg | ORAL_TABLET | Freq: Two times a day (BID) | ORAL | 3 refills | Status: DC

## 2021-02-21 NOTE — Progress Notes (Signed)
Patient ID: [name], female   DOB: [birth year], 70 y.o.   MRN: [id number]   This visit occurred during the SARS-CoV-2 public health emergency.  Safety protocols were in place, including screening questions prior to the visit, additional usage of staff PPE, and extensive cleaning of exam room while observing appropriate contact time as indicated for disinfecting solutions.   HPI: [name] is a 70 y.o.-year-old female, initially referred by her PCP, Dr. Harley Alto for f/u for DM2, dx in 1980s, insulin-dependent, controlled, with complications (PN - s/p B digit amputations 2/2 osteomyelitis, PAD, foot ulcer). She saw Dr. Elyse Hsu previously. Last visit with me 5 months ago.  Interim history: She continues to have back pain radiating to the legs.  Since last visit, she had lumbar spine surgery. This helped the pain significantly. However, she fell last week >> back hurts again. On Doxycycline for L 3rd toe periungual inf. She was on steroid injections for L shoulder pain. Pain much improved. No increased urination, blurry vision, nausea, chest pain. Has bloating.  Reviewed her HbA1c levels: 09/30/2020: HbA1c calculated from fructosamine is much better, at 6.6%, correlating more with her blood sugars at home. Lab Results  Component Value Date   HGBA1C 7.8 (H) 12/14/2020   HGBA1C 8.4 (A) 09/30/2020   HGBA1C 8.1 (A) 06/30/2020  02/16/2016: 7.1% 06/2015: 6.6%  Pt is on a regimen of: - Jardiance 12.5 >> 10 mg daily before b'fast - but needed a PA to continue treatment >> 25 mg daily in am - Glipizide 2.5 mg before b'fast - added back 09/2018 >> 5 mg 2x a day before meals - Toujeo >> Tresiba 20 >> ... 52 units daily  (felt poorly at 54 units daily) - Novolog 7 to 10 >> 12 units before each meal She stopped Ozempic 04/2019 due to GI symptoms.  We retried it in 09/2019 with same symptoms so he had to stop. She stopped Metformin ER >> AP and diarrhea She was on Victoza >> rash. In  09/2016, she had a steroid inj in back >> sugars 200s >> used Novolog for 3 days.  She initially refused insulin.  Pt checks her sugars once a day: - am: 98-169 (higher 2/2 UTI) >> 87, 110-151, 157, 199 >> 100-150 (no steroids), 159-279 (on steroids) - 2h after b'fast: 160-222, 250 >> 129-191, 231 >> 117  >> 120-144>> n/c - before lunch: 145-201, 231 >> 130-150, 200 >> 125-140, 157 >> n/c - 2h after lunch: 94, 220 >> 135-150 >> 75, 121-145, 155 >> 120-152 - before dinner: 183-237, 274 >> 150-269 >> 135 >> 140-190 >> n/c - 2h after dinner: 150, 216 >> n/c >> 123 >> 94-132 (no steroids), 220-420 (on steroids) - bedtime:  272 >> 180 >> n/c >> 259 >> n/c >> 145 >> n/c - nighttime: n/c Lowest sugar was 98 >> 75 >> 94; she has hypoglycemia awareness in the 90s. Highest sugar was 436 ...>> 260 (steroid inj) >> 420 (steroids).  Glucometer: One Probation officer - at last visit I advised her to get the new meter  Pt's meals are: - Breakfast: [place] bacon, 2 eggs, toast; oatmeal; or brown rice - Lunch: Tuna, crackers or sandwich, chicken strips, chips, diet Pepsi - Dinner: Meat, veggies, starch  - Snacks: 2-3: 15g carbs (pretzels, chips)  -+ CKD, last BUN/creatinine:  11/21/2020: CMP normal, except glucose 106, BUN/creatinine 49/1.36, GFR 43 05/25/2020: CMP normal except: Glu 129, BUNs/creatinine 44/1.29, GFR 41, otherwise normal 12/31/2019: 42/1.37, GFR 38, glucose 193,  otherwise normal CMP Lab Results  Component Value Date   BUN 40 (H) 12/23/2020   BUN 40 (H) 12/14/2020   CREATININE 1.39 (H) 12/23/2020   CREATININE 1.50 (H) 12/14/2020  03/21/2018: CMP normal with the exception of high glucose at 143, BUN/creatinine 37/1.26, GFR 43 (kidney function improved from 09/2017) Urinalysis with 1+ protein 09/13/2017: Glucose 123, BUN 44/creatinine 1.34, GFR 40, previously 44 03/06/2017: ACR 222.8 08/29/2016: ACR 76.7 02/16/2016: 42/1.34 (GFR 40), Glu 213  12/26/2015: 25/1.23 (GFR 44), Glu 153 Off  lisinopril.  -+ HL; last set of lipids: 11/21/2020: 117/204/35/49 05/25/2020: 106/204/33/40 12/31/2019: 102/147/33/43 Lab Results  Component Value Date   CHOL 110 02/04/2019   HDL 31.60 (L) 02/04/2019   LDLDIRECT 44.0 02/04/2019   TRIG 255.0 (H) 02/04/2019   CHOLHDL 3 02/04/2019  03/21/2018: 92/202/31/21 09/04/2017: 126/269/33/39 08/29/2016: 87/132/32/30 02/16/2016: 128/278/33/39  03/02/2014: 114/175/32/48 On Zocor 40.  - last eye exam was 12/2019: No DR (Dr. Gershon Crane).  She has a history of cataract surgery.  -She has numbness and tingling in her feet.  She sees podiatry -Dr. Jacqualyn Posey.  She also has a history of HTN, GERD, anemia. She had amputation of 1/2 of the left hallux 05/06/2017.  This is now healed. She started Humira (for psoriasis) but did not tolerate this >> stopped. She started PT for her back pain. Also dry needling.   ROS: + See HPI Musculoskeletal: + muscle aches/+ joint aches Neurological: no tremors/+ numbness/+ tingling/no dizziness  I reviewed pt's medications, allergies, PMH, social hx, family hx, and changes were documented in the history of present illness. Otherwise, unchanged from my initial visit note.  Past Medical History:  Diagnosis Date   Anemia in chronic renal disease 06/05/2011   Anxiety    takes Ativan tid   Asthma    has an inhaler prn   Chronic kidney disease    Cough    DDD (degenerative disc disease), cervical 06/05/2011   DDD (degenerative disc disease), lumbosacral 06/05/2011   Depression    takes Cymbalta daily   Diabetes mellitus    takes Amaryl and Metformin daily   Diabetic neuropathy (HCC)    Diverticulosis    Dizziness    GERD (gastroesophageal reflux disease)    takes Protonix prn   Headache(784.0)    occasionally   History of bronchitis    last time 85yrs ago   History of colon polyps    History of MRSA infection    HTN (hypertension) 06/05/2011   takes Prinizide daily   Hyperlipidemia    takes Zocor daily    Insomnia    not on any meds at present time   Osteoarthritis    PONV (postoperative nausea and vomiting)    hard to wake up   Psoriasis 06/05/2011   on legs and uses cream prn   Seasonal allergies    but doesn't take any meds   Sleep apnea    uses CPAP   Tubular adenoma of colon    Past Surgical History:  Procedure Laterality Date   ABDOMINAL HYSTERECTOMY  1982 and 1984   AMPUTATION  02/12/2012   Procedure: AMPUTATION RAY;  Surgeon: Wylene Simmer, MD;  Location: [facility];  Service: Orthopedics;  Laterality: Right;  RIGHT 2ND TOE AMPUTATION    ANTERIOR LAT LUMBAR FUSION N/A 12/22/2020   Procedure: Thoracic twelve to Lumbar one posterior lateral lumbar interbody fusion with pedicle screw fixation Thoracic ten to Lumbar two with robot, posterior arthrodesis with allograft from Thoracic ten to Lumbar two;  Surgeon: Kristeen Miss, MD;  Location: [facility];  Service: Neurosurgery;  Laterality: N/A;   APPLICATION OF ROBOTIC ASSISTANCE FOR SPINAL PROCEDURE N/A 12/22/2020   Procedure: APPLICATION OF ROBOTIC ASSISTANCE FOR SPINAL PROCEDURE;  Surgeon: Kristeen Miss, MD;  Location: [facility];  Service: Neurosurgery;  Laterality: N/A;  3C/RM 19   BACK SURGERY     x 3   CARDIAC CATHETERIZATION     08/09/04: 50% mid AV groove CX lesion, NL LM, LAD, Ramus, EF > 60% (Dr. Gwenlyn Found)   [facility] Bilateral 2010   [facility]   COLONOSCOPY  05/30/2009, 09/30/13   [facility] 2011/Dr.Brodie 2015   DILATION AND CURETTAGE OF UTERUS     ELBOW SURGERY     left d/t tendonitis   ESOPHAGOGASTRODUODENOSCOPY     LUMBAR WOUND DEBRIDEMENT N/A 07/15/2012   Procedure: I & D of Lumbar Wound: Possible Repair of CSF Leak;  Surgeon: Elaina Hoops, MD;  Location: [facility];  Service: Neurosurgery;  Laterality: N/A;  I & D of Lumbar Wound: Possible Repair of CSF Leak placement lumbar drain   POLYPECTOMY     right knee arthroscopy     x 2   SHOULDER ARTHROSCOPY Left    TOE AMPUTATION Left     Social History   Social History   Marital status: Married    Spouse name: N/A   Number of children: 2   Occupational History   Disabled    Social History Main Topics   Smoking status: Never Smoker   Smokeless tobacco: Never Used     Comment: never used tobacco   Alcohol use No   Drug use: No   Current Outpatient Medications on File Prior to Visit  Medication Sig Dispense Refill   albuterol (PROVENTIL HFA;VENTOLIN HFA) 108 (90 BASE) MCG/ACT inhaler Inhale 1 puff into the lungs every 4 (four) hours as needed for wheezing or shortness of breath.      aspirin 81 MG tablet Take 81 mg by mouth daily.     budesonide-formoterol (SYMBICORT) 160-4.5 MCG/ACT inhaler Inhale 1 puff into the lungs 2 (two) times daily as needed (shortness of breath).     Cholecalciferol (VITAMIN D) 50 MCG (2000 UT) tablet Take 2,000 Units by mouth daily.     clindamycin (CLEOCIN) 300 MG capsule Take 1 capsule (300 mg total) by mouth 3 (three) times daily. (Patient not taking: No sig reported) 21 capsule 0   clobetasol (OLUX) 0.05 % topical foam Apply 1 application topically daily.     COLLAGEN PO Take 2 capsules by mouth daily.     doxycycline (VIBRA-TABS) 100 MG tablet Take 1 tablet (100 mg total) by mouth 2 (two) times daily. (Patient not taking: No sig reported) 20 tablet 0   DULoxetine (CYMBALTA) 30 MG capsule Take 30-60 mg by mouth See admin instructions. Take 60 mg in the morning and 30 mg  5   empagliflozin (JARDIANCE) 25 MG TABS tablet Take 1 tablet (25 mg total) by mouth daily. 90 tablet 3   gabapentin (NEURONTIN) 100 MG capsule Take 100 mg by mouth at bedtime.     gabapentin (NEURONTIN) 100 MG capsule Take by mouth.     glipiZIDE (GLUCOTROL) 5 MG tablet TAKE 1 TABLET(5 MG) BY MOUTH TWICE DAILY BEFORE A MEAL (Patient taking differently: Take 5 mg by mouth 2 (two) times daily before a meal.) 180 tablet 1   glucose blood (ONETOUCH VERIO) test strip TEST TWICE DAILY  200 strip 11   hydrocortisone cream 1 %  Apply 1 application topically 2 (two) times daily as needed (rash).     insulin aspart (NOVOLOG) 100 UNIT/ML injection Inject 7-10 Units into the skin 3 (three) times daily before meals. (Patient taking differently: Inject 10 Units into the skin 3 (three) times daily before meals.) 30 mL 3   insulin degludec (TRESIBA FLEXTOUCH) 200 UNIT/ML FlexTouch Pen Inject 52 units into the skin daily (Patient taking differently: Inject 52 Units into the skin at bedtime.) 18 mL 2   Insulin Pen Needle 31G X 5 MM MISC by Does not apply route.     Insulin Pen Needle 32G X 5 MM MISC by Does not apply route.     Insulin Syringe-Needle U-100 (INSULIN SYRINGE 1CC/31GX5/16") 31G X 5/16" 1 ML MISC Use 4 syringes per day to inject insulin 120 each 5   Ketotifen Fumarate (ALLERGY EYE DROPS OP) Place 1 drop into both eyes daily as needed (allergies).     Lancets (ONETOUCH ULTRASOFT) lancets      lisinopril-hydrochlorothiazide (ZESTORETIC) 20-12.5 MG tablet Take 2 tablets by mouth daily.     LORazepam (ATIVAN) 1 MG tablet Take 1 mg by mouth 2 (two) times daily as needed for anxiety.     methocarbamol (ROBAXIN) 500 MG tablet Take 1 tablet (500 mg total) by mouth 4 (four) times daily. 45 tablet 0   metoprolol succinate (TOPROL-XL) 100 MG 24 hr tablet Take 100 mg by mouth daily. Take with or immediately following a meal.     Multiple Vitamins-Minerals (MULTIVITAMIN WITH MINERALS) tablet Take 1 tablet by mouth daily.      Needle, Disp, 32G X 5/16" MISC by Does not apply route.     ondansetron (ZOFRAN) 8 MG tablet Take 8 mg by mouth every 8 (eight) hours as needed for nausea or vomiting.     pantoprazole (PROTONIX) 40 MG tablet Take 40 mg by mouth daily.     Probiotic Product (PROBIOTIC DAILY PO) Take 1 capsule by mouth daily.      simvastatin (ZOCOR) 40 MG tablet Take 40 mg by mouth daily in the afternoon.     triamcinolone (KENALOG) 0.025 % ointment Apply 1 application topically 2 (two) times daily. (Patient taking  differently: Apply 1 application topically 2 (two) times daily as needed (toe ulcer).) 30 g 0   No current facility-administered medications on file prior to visit.   Allergies  Allergen Reactions   Victoza [Liraglutide] Hives and Rash   Hydromorphone Hives, Diarrhea, Swelling and Rash   Ultram [Tramadol] Rash   Acetaminophen     Pt was told not to take   Betadine [Povidone Iodine] Other (See Comments)    Skin peels   Bydureon [Exenatide] Diarrhea and Nausea And Vomiting   Chlorhexidine     Exacerbates psoriasis    Codeine Other (See Comments)    Severe headaches   Contrast Media [Iodinated Diagnostic Agents] Nausea And Vomiting   Humira [Adalimumab] Hives   Hydrocodone Other (See Comments)    headache   Invokana [Canagliflozin] Other (See Comments)    Cannot take this 2/2 PAD and toe amputations   Iodine Hives   Januvia [Sitagliptin] Diarrhea   Morphine And Related Nausea And Vomiting   Ozempic (0.25 Or 0.5 Mg-Dose) [Semaglutide(0.25 Or 0.5mg -Dos)] Nausea And Vomiting   Percocet [Oxycodone-Acetaminophen] Other (See Comments)    Headache    Prednisone Diarrhea and Nausea And Vomiting   Sulfa Antibiotics Itching   Valium Other (See Comments)  headache   Amoxicillin Hives and Rash    Did it involve swelling of the face/tongue/throat, SOB, or low BP? Unknown Did it involve sudden or severe rash/hives, skin peeling, or any reaction on the inside of your mouth or nose? Yes Did you need to seek medical attention at a hospital or doctor's office? Yes When did it last happen?      unk If all above answers are "NO", may proceed with cephalosporin use.    Darvocet [Propoxyphene N-Acetaminophen] Rash and Other (See Comments)    headache   Fentanyl And Related Rash   Wellbutrin [Bupropion Hcl] Hives and Rash   Family History  Problem Relation Age of Onset   Colon cancer Brother 23   Hypertension Mother        died from infection s/p hip surgery   Heart attack Father     Colon cancer Maternal Grandfather    Stomach cancer Neg Hx    Esophageal cancer Neg Hx    Rectal cancer Neg Hx    Liver cancer Neg Hx    Colon polyps Neg Hx    PE: BP (!) 162/84 (BP Location: Left Arm, Patient Position: Sitting, Cuff Size: Normal)   Pulse 75   Ht 5\' 3"  (1.6 m)   Wt 233 lb 6.4 oz (105.9 kg)   SpO2 99%   BMI 41.34 kg/m  Wt Readings from Last 3 Encounters:  02/21/21 233 lb 6.4 oz (105.9 kg)  12/22/20 227 lb (103 kg)  12/14/20 229 lb 12.8 oz (104.2 kg)   Constitutional: overweight, in NAD Eyes: PERRLA, EOMI, no exophthalmos ENT: moist mucous membranes, no thyromegaly, no cervical lymphadenopathy Cardiovascular: RRR, No MRG Respiratory: CTA B Gastrointestinal: abdomen soft, NT, ND, BS+ Musculoskeletal: no deformities, strength intact in all 4 Skin: moist, warm, no rashes Neurological: no tremor with outstretched hands, DTR normal in all 4  ASSESSMENT: 1. DM2, insulin-dependent, controlled, with complications: - PN - s/p B 2nd toe amputations 2/2 osteomyelitis - 2013 and 2015; 1/2 hallux 04/2017 - PAD -we cannot do the Invokana for her  - foot ulcer  2. HL  3.  Obesity class II  PLAN:  1. Patient with longstanding, uncontrolled, type 2 diabetes, on SGLT2 inhibitor, sulfonylurea, and basal-bolus insulin regimen, with still poor control due to recent steroid injections.  At last visit she had severe back pain and she was able to have lumbar spine fusion surgery since then, with good results.  Her pain decreased significantly, but she again has increased pain now after she fell last week.  She feels that this is improving, though. -We tried Ozempic in the past but she developed diverticulitis and we had to stop.  We will retry this after the diverticulitis episode resolved, but she again developed abdominal pain and had to stop. -At this visit, sugars are mostly at goal in periods when she is off steroids, but she had repeated injections of steroids for back pain in  the past and more recently for shoulder pain and her sugars increased as high as 400s afterwards and stayed elevated for several days.  She contacted me about the high blood sugars recently >> we increased her NovoLog doses.  At this visit, we discussed about decreasing them even more and I advised her that she does need higher doses whenever she gets steroid injections.  However, I hope that she will not need steroid injections in the future. - I suggested to:  Patient Instructions  Please continue: - Jardiance 25 mg  daily before b'fast - Glipizide 5 mg 2x a day before meals - Tresiba 52 units at bedtime   Change: - Novolog 12-16 units 15 min before a meal 3x a day (up to 20 units with steroids)  Please come back for a follow-up appointment in 3 months.  - we checked her HbA1c: 8.5% (higher) - advised to check sugars at different times of the day - 3-4x a day, rotating check times - advised for yearly eye exams >> she is not UTD - return to clinic in 3-4 months  2. HL -Reviewed latest lipid panel from 11/2020: Fractions at goal except for high triglycerides. -Continue Zocor 40 mg daily without side effects  3.  Obesity class II -We will continue Jardiance -  this also helps with weight loss -Unfortunately, she could not tolerate Ozempic due to diverticulitis and abdominal pain.  This would have helped with weight loss -She gained 4 pounds before last visit and 3 pounds since then  Philemon Kingdom, MD PhD [facility]

## 2021-02-21 NOTE — Patient Instructions (Signed)
Please continue: - Jardiance 25 mg daily before b'fast - Glipizide 5 mg 2x a day before meals - Tresiba 52 units at bedtime   Change: - Novolog 12-16 units 15 min before a meal 3x a day (up to 20 units with steroids)  Please come back for a follow-up appointment in 3 months.

## 2021-02-22 ENCOUNTER — Ambulatory Visit: Payer: HMO | Admitting: Podiatry

## 2021-02-24 DIAGNOSIS — M5442 Lumbago with sciatica, left side: Secondary | ICD-10-CM | POA: Diagnosis not present

## 2021-03-02 ENCOUNTER — Encounter: Payer: Self-pay | Admitting: Internal Medicine

## 2021-03-02 DIAGNOSIS — G4733 Obstructive sleep apnea (adult) (pediatric): Secondary | ICD-10-CM | POA: Diagnosis not present

## 2021-03-10 DIAGNOSIS — M5442 Lumbago with sciatica, left side: Secondary | ICD-10-CM | POA: Diagnosis not present

## 2021-03-14 ENCOUNTER — Encounter: Payer: Self-pay | Admitting: Internal Medicine

## 2021-03-14 DIAGNOSIS — E1159 Type 2 diabetes mellitus with other circulatory complications: Secondary | ICD-10-CM

## 2021-03-14 DIAGNOSIS — M5442 Lumbago with sciatica, left side: Secondary | ICD-10-CM | POA: Diagnosis not present

## 2021-03-15 ENCOUNTER — Encounter: Payer: Self-pay | Admitting: Internal Medicine

## 2021-03-15 MED ORDER — INSULIN PEN NEEDLE 32G X 5 MM MISC: 3 refills | Status: DC

## 2021-03-15 MED ORDER — "INSULIN SYRINGE 31G X 5/16"" 1 ML MISC": 3 refills | Status: DC

## 2021-03-15 NOTE — Telephone Encounter (Signed)
Pt picked up samples

## 2021-03-20 DIAGNOSIS — M25512 Pain in left shoulder: Secondary | ICD-10-CM | POA: Diagnosis not present

## 2021-03-21 ENCOUNTER — Other Ambulatory Visit: Payer: Self-pay | Admitting: Orthopaedic Surgery

## 2021-03-21 DIAGNOSIS — M25512 Pain in left shoulder: Secondary | ICD-10-CM

## 2021-03-24 DIAGNOSIS — Z1231 Encounter for screening mammogram for malignant neoplasm of breast: Secondary | ICD-10-CM | POA: Diagnosis not present

## 2021-04-10 ENCOUNTER — Ambulatory Visit: Payer: HMO | Admitting: Podiatry

## 2021-04-15 ENCOUNTER — Ambulatory Visit
Admission: RE | Admit: 2021-04-15 | Discharge: 2021-04-15 | Disposition: A | Discharge status: Home / Self Care | Payer: HMO | Source: Ambulatory Visit | Attending: Orthopaedic Surgery | Admitting: Orthopaedic Surgery

## 2021-04-15 ENCOUNTER — Other Ambulatory Visit: Payer: Self-pay

## 2021-04-15 DIAGNOSIS — M75112 Incomplete rotator cuff tear or rupture of left shoulder, not specified as traumatic: Secondary | ICD-10-CM | POA: Diagnosis not present

## 2021-04-15 DIAGNOSIS — S46112A Strain of muscle, fascia and tendon of long head of biceps, left arm, initial encounter: Secondary | ICD-10-CM | POA: Diagnosis not present

## 2021-04-15 DIAGNOSIS — M19012 Primary osteoarthritis, left shoulder: Secondary | ICD-10-CM | POA: Diagnosis not present

## 2021-04-15 DIAGNOSIS — M25512 Pain in left shoulder: Secondary | ICD-10-CM

## 2021-04-18 ENCOUNTER — Ambulatory Visit (AMBULATORY_SURGERY_CENTER): Payer: HMO

## 2021-04-18 VITALS — Ht 63.0 in | Wt 230.0 lb

## 2021-04-18 DIAGNOSIS — Z86010 Personal history of colonic polyps: Secondary | ICD-10-CM

## 2021-04-18 DIAGNOSIS — Z800 Family history of malignant neoplasm of digestive organs: Secondary | ICD-10-CM

## 2021-04-18 MED ORDER — PEG 3350-KCL-NA BICARB-NACL 420 G PO SOLR: 4000.0000 mL | Freq: Once | ORAL | 0 refills | Status: AC

## 2021-04-18 NOTE — Progress Notes (Signed)
No egg or soy allergy known to patient  No issues known to pt with past sedation with any surgeries or procedures Patient denies ever being told they had issues or difficulty with intubation  No FH of Malignant Hyperthermia Pt is not on diet pills Pt is not on  home 02  Pt is not on blood thinners  Pt denies issues with constipation  No A fib or A flutter  Pt is not vaccinated  for Covid   Virtual previsit     NO PA's for preps discussed with pt In PV today  Discussed with pt there will be an out-of-pocket cost for prep and that varies from $0 to 70 +  dollars - pt verbalized understanding   Due to the COVID-19 pandemic we are asking patients to follow certain guidelines in PV and the [place]   Pt aware of COVID protocols and [facility] guidelines

## 2021-04-19 DIAGNOSIS — M25512 Pain in left shoulder: Secondary | ICD-10-CM | POA: Diagnosis not present

## 2021-04-26 DIAGNOSIS — M7522 Bicipital tendinitis, left shoulder: Secondary | ICD-10-CM | POA: Diagnosis not present

## 2021-04-26 DIAGNOSIS — R531 Weakness: Secondary | ICD-10-CM | POA: Diagnosis not present

## 2021-04-27 ENCOUNTER — Encounter: Payer: HMO | Admitting: Internal Medicine

## 2021-04-27 DIAGNOSIS — G4733 Obstructive sleep apnea (adult) (pediatric): Secondary | ICD-10-CM | POA: Diagnosis not present

## 2021-04-28 DIAGNOSIS — M5442 Lumbago with sciatica, left side: Secondary | ICD-10-CM | POA: Diagnosis not present

## 2021-04-28 DIAGNOSIS — Z6841 Body Mass Index (BMI) 40.0 and over, adult: Secondary | ICD-10-CM | POA: Diagnosis not present

## 2021-04-28 DIAGNOSIS — I10 Essential (primary) hypertension: Secondary | ICD-10-CM | POA: Diagnosis not present

## 2021-05-02 ENCOUNTER — Ambulatory Visit (INDEPENDENT_AMBULATORY_CARE_PROVIDER_SITE_OTHER): Payer: HMO

## 2021-05-02 ENCOUNTER — Ambulatory Visit: Payer: HMO | Admitting: Podiatry

## 2021-05-02 ENCOUNTER — Other Ambulatory Visit: Payer: Self-pay

## 2021-05-02 DIAGNOSIS — L03032 Cellulitis of left toe: Secondary | ICD-10-CM

## 2021-05-02 DIAGNOSIS — L97522 Non-pressure chronic ulcer of other part of left foot with fat layer exposed: Secondary | ICD-10-CM

## 2021-05-02 DIAGNOSIS — L02612 Cutaneous abscess of left foot: Secondary | ICD-10-CM

## 2021-05-02 MED ORDER — DOXYCYCLINE HYCLATE 100 MG PO TABS: 100.0000 mg | ORAL_TABLET | Freq: Two times a day (BID) | ORAL | 0 refills | Status: DC

## 2021-05-02 NOTE — Progress Notes (Signed)
Subjective: 70 year old female presents the office today for concerns of a wound on her left fourth toe that she noticed on Friday.  Pain level 2/10.  She is some burning sensation she has noticed some redness and swelling but no drainage or pus.  No red streaks.  No recent treatment.  Not sure how this started.  Denies any fevers or chills currently.  Objective: AAO x3, NAD DP/PT pulses palpable bilaterally, CRT less than 3 seconds Protective sensation decreased with Simms Weinstein monofilament The distal aspect of the left fourth toe is granular wound present without any probing, undermining or tunneling.  There is localized edema and erythema present to the digit there is no ascending cellulitis.  No fluctuation crepitation.  No significant malodor noted. No pain with calf compression, swelling, warmth, erythema       Assessment: Ulceration left fourth toe, cellulitis  Plan: -X-rays were obtained and reviewed with the patient.  No definite evidence of acute osteomyelitis although given digital deformity difficult to fully evaluate.  No soft tissue emphysema. -ABI in the office: Left was 1.06 right 1.09 -Antibiotics: Doxycyline (start 05/02/2021) -Offloading: Surgical shoe with offloading for the toe. -Blood work: CBC, BMP, ESR, CRP   Procedure: Excisional Debridement of Wound Rationale: Removal of non-viable soft tissue from the wound to promote healing.  Anesthesia: none Post-Debridement Wound Measurements: 0.6 cm x 0.5 cm x 0.1 cm  Type of Debridement: Sharp Excisional Tissue Removed: Non-viable soft tissue Depth of Debridement: subcutaneous tissue. Technique: Sharp excisional debridement to bleeding, viable wound base.  Dressing: Dry, sterile, compression dressing. Disposition: Patient tolerated procedure well.    Return in about 10 days (around 05/12/2021) for ulcer left 4th toe.    Trula Slade DPM

## 2021-05-09 ENCOUNTER — Encounter: Payer: Self-pay | Admitting: Internal Medicine

## 2021-05-09 DIAGNOSIS — Z961 Presence of intraocular lens: Secondary | ICD-10-CM | POA: Diagnosis not present

## 2021-05-09 DIAGNOSIS — E119 Type 2 diabetes mellitus without complications: Secondary | ICD-10-CM | POA: Diagnosis not present

## 2021-05-09 DIAGNOSIS — Z794 Long term (current) use of insulin: Secondary | ICD-10-CM | POA: Diagnosis not present

## 2021-05-09 DIAGNOSIS — H524 Presbyopia: Secondary | ICD-10-CM | POA: Diagnosis not present

## 2021-05-09 DIAGNOSIS — H5212 Myopia, left eye: Secondary | ICD-10-CM | POA: Diagnosis not present

## 2021-05-09 MED ORDER — ONETOUCH VERIO VI STRP: ORAL_STRIP | 1 refills | Status: DC

## 2021-05-11 ENCOUNTER — Ambulatory Visit: Payer: HMO | Admitting: Podiatry

## 2021-05-11 ENCOUNTER — Other Ambulatory Visit: Payer: Self-pay

## 2021-05-11 DIAGNOSIS — L97522 Non-pressure chronic ulcer of other part of left foot with fat layer exposed: Secondary | ICD-10-CM | POA: Diagnosis not present

## 2021-05-11 DIAGNOSIS — M2042 Other hammer toe(s) (acquired), left foot: Secondary | ICD-10-CM

## 2021-05-12 ENCOUNTER — Encounter: Payer: Self-pay | Admitting: Podiatry

## 2021-05-12 NOTE — Telephone Encounter (Signed)
Please advise 

## 2021-05-14 NOTE — Progress Notes (Signed)
Subjective: 71 year old female presents the office today for follow-up evaluation of a wound on the left fourth toe.  She said the wound itself is doing better but she peeled the Band-Aid off and some skin came off underneath the toe.  Denies any drainage or pus or any swelling or redness increase.  No fevers or chills that she reports that she has no new concerns today.  Objective: AAO x3, NAD-presents today wearing surgical shoe DP/PT pulses palpable bilaterally, CRT less than 3 seconds Protective sensation decreased with Simms Weinstein monofilament The distal aspect left fourth toe on the area of the granular wound this appears to be healing.  Is adjacent to this was what appeared to be a dried hemorrhagic blood blister.  Underlying skin intact.  Submetatarsal there is a linear skin fissure from the skin peeled off with a granular base.  No drainage or pus noted to the areas.  No fluctuation or crepitation noted. Hammertoes present. No pain with calf compression, swelling, warmth, erythema  Assessment: Ulceration left fourth toe, improved cellulitis  Plan: -Overall the wound itself has been doing better and there is decreased edema and from infection standpoint seems to be improving.  We will hold on further oral antibiotics for now.  Recommend a small amount into the clinic dressing changes daily.  Continue offloading. -ABI in the office: Left was 1.06 right 1.09 -Offloading: Surgical shoe with offloading for the toe. -Blood work: CBC, BMP, ESR, CRP-still pending  Trula Slade DPM

## 2021-05-23 ENCOUNTER — Other Ambulatory Visit: Payer: Self-pay

## 2021-05-23 ENCOUNTER — Ambulatory Visit: Payer: HMO | Admitting: Podiatry

## 2021-05-23 DIAGNOSIS — M2042 Other hammer toe(s) (acquired), left foot: Secondary | ICD-10-CM | POA: Diagnosis not present

## 2021-05-23 DIAGNOSIS — L97522 Non-pressure chronic ulcer of other part of left foot with fat layer exposed: Secondary | ICD-10-CM | POA: Diagnosis not present

## 2021-05-26 ENCOUNTER — Encounter: Payer: Self-pay | Admitting: Internal Medicine

## 2021-05-28 NOTE — Progress Notes (Signed)
Subjective: 71 year old female presents the office today for follow-up evaluation of a wound on the left fourth toe.  She states that she is doing much better.  She is not have any drainage recently.  Minimal swelling.  No increased redness.  No red streaks.  Denies any fevers or chills.  She has no other concerns today.   Objective: AAO x3, NAD-presents today wearing surgical shoe DP/PT pulses palpable bilaterally, CRT less than 3 seconds Protective sensation decreased with Semmes Weinstein monofilament Hyperkeratotic lesion with dried blood present distal aspect of left fourth toe.  Upon debridement ulceration is healed.  The area of the skin fissure that was present submetatarsal is also healed.  There is no open sore identified.  There is no drainage or pus.  Faint edema but there is no erythema or warmth or ascending cellulitis.  There is no fluctuation or crepitation.  There is no malodor. Hammertoes present. No pain with calf compression, swelling, warmth, erythema  Assessment: Healed ulceration left fourth toe, improved cellulitis  Plan: -Debrided hyperkeratotic tissue today and the wound is healed.  She still needs to monitor this very closely for any reoccurrence given the adductovarus that is noted at the toe.  Continue offloading pads and shoe modifications.  She can start to transition back to diabetic shoe as tolerated with daily foot inspection monitor for any changes and if anything were to change to let me know immediately. -ABI in the office: Left was 1.06 right 1.09 -Blood work: CBC, BMP, ESR, CRP-still pending-at this point the wound is healed. Will hold off for now but will recheck if any recurrence.   Trula Slade DPM

## 2021-05-29 DIAGNOSIS — N183 Chronic kidney disease, stage 3 unspecified: Secondary | ICD-10-CM | POA: Diagnosis not present

## 2021-05-29 DIAGNOSIS — Z0000 Encounter for general adult medical examination without abnormal findings: Secondary | ICD-10-CM | POA: Diagnosis not present

## 2021-05-29 DIAGNOSIS — Z89421 Acquired absence of other right toe(s): Secondary | ICD-10-CM | POA: Diagnosis not present

## 2021-05-29 DIAGNOSIS — L409 Psoriasis, unspecified: Secondary | ICD-10-CM | POA: Diagnosis not present

## 2021-05-29 DIAGNOSIS — G4733 Obstructive sleep apnea (adult) (pediatric): Secondary | ICD-10-CM | POA: Diagnosis not present

## 2021-05-29 DIAGNOSIS — I129 Hypertensive chronic kidney disease with stage 1 through stage 4 chronic kidney disease, or unspecified chronic kidney disease: Secondary | ICD-10-CM | POA: Diagnosis not present

## 2021-05-29 DIAGNOSIS — E782 Mixed hyperlipidemia: Secondary | ICD-10-CM | POA: Diagnosis not present

## 2021-05-29 DIAGNOSIS — M47812 Spondylosis without myelopathy or radiculopathy, cervical region: Secondary | ICD-10-CM | POA: Diagnosis not present

## 2021-05-29 DIAGNOSIS — E1129 Type 2 diabetes mellitus with other diabetic kidney complication: Secondary | ICD-10-CM | POA: Diagnosis not present

## 2021-05-29 DIAGNOSIS — G2581 Restless legs syndrome: Secondary | ICD-10-CM | POA: Diagnosis not present

## 2021-05-29 DIAGNOSIS — F3341 Major depressive disorder, recurrent, in partial remission: Secondary | ICD-10-CM | POA: Diagnosis not present

## 2021-05-31 ENCOUNTER — Encounter: Payer: Self-pay | Admitting: Internal Medicine

## 2021-05-31 ENCOUNTER — Ambulatory Visit (INDEPENDENT_AMBULATORY_CARE_PROVIDER_SITE_OTHER): Payer: HMO | Admitting: Internal Medicine

## 2021-05-31 ENCOUNTER — Other Ambulatory Visit: Payer: Self-pay

## 2021-05-31 VITALS — BP 138/58 | HR 69 | Ht 63.0 in | Wt 237.2 lb

## 2021-05-31 DIAGNOSIS — E1159 Type 2 diabetes mellitus with other circulatory complications: Secondary | ICD-10-CM | POA: Diagnosis not present

## 2021-05-31 DIAGNOSIS — E1165 Type 2 diabetes mellitus with hyperglycemia: Secondary | ICD-10-CM | POA: Diagnosis not present

## 2021-05-31 DIAGNOSIS — E66812 Obesity, class 2: Secondary | ICD-10-CM

## 2021-05-31 DIAGNOSIS — E785 Hyperlipidemia, unspecified: Secondary | ICD-10-CM

## 2021-05-31 DIAGNOSIS — E669 Obesity, unspecified: Secondary | ICD-10-CM | POA: Diagnosis not present

## 2021-05-31 LAB — POCT GLYCOSYLATED HEMOGLOBIN (HGB A1C): Hemoglobin A1C: 8.5 % — AB (ref 4.0–5.6)

## 2021-05-31 NOTE — Progress Notes (Signed)
Patient ID: [name], female   DOB: [birth year], 71 y.o.   MRN: [id number]   This visit occurred during the SARS-CoV-2 public health emergency.  Safety protocols were in place, including screening questions prior to the visit, additional usage of staff PPE, and extensive cleaning of exam room while observing appropriate contact time as indicated for disinfecting solutions.   HPI: [name] is a 71 y.o.-year-old female, initially referred by her PCP, Dr. Harley Alto for f/u for DM2, dx in 1980s, insulin-dependent, controlled, with complications (PN - s/p B digit amputations 2/2 osteomyelitis, PAD, foot ulcer). She saw Dr. Elyse Hsu previously. Last visit with me 3 months ago.  Interim history: Continues to have L shoulder pain >> steroid injections >> sugars higher. She had an MRI: 3 tears. She also had a L toe ulcer since last OV - finished ABx.  She is in a boot. Sees podiatry.  The ulcer is healing. No increased urination, blurry vision, nausea, chest pain. She has a colonoscopy tomorrow.  She was already advised to stop Jardiance and glipizide and decrease the dose of Tresiba.  Reviewed her HbA1c levels: Lab Results  Component Value Date   HGBA1C 8.5 (A) 02/21/2021   HGBA1C 7.8 (H) 12/14/2020   HGBA1C 8.4 (A) 09/30/2020  09/30/2020: HbA1c calculated from fructosamine is much better, at 6.6%, correlating more with her blood sugars at home. 02/16/2016: 7.1% 06/2015: 6.6%  Pt is on a regimen of: - Jardiance 12.5 >> 10 mg daily before b'fast - but needed a PA to continue treatment >> 25 mg daily in am - Glipizide 2.5 mg before b'fast - added back 09/2018 >> 5 mg 2x a day before meals - Toujeo >> Tresiba 20 >> ... 52 units daily  (felt poorly at 54 units daily) - Novolog 7 to 10 >> 12 >>  >> but still using 10-12 units before each meal (!!) She stopped Ozempic 04/2019 due to GI symptoms.  We retried it in 09/2019 with same symptoms so he had to stop. She stopped Metformin ER  >> AP and diarrhea She was on Victoza >> rash. In 09/2016, she had a steroid inj in back >> sugars 200s >> used Novolog for 3 days.  She initially refused insulin.  Pt checks her sugars once a day: - am: 87, 110-151, 157, 199 >> 100-150 (no steroids), 159-279 (on steroids) >> 93, 123-215 - 2h after b'fast: 160-222, 250 >> 129-191, 231 >> 117  >> 120-144>> n/c >> 101-175 - before lunch: 145-201, 231 >> 130-150, 200 >> 125-140, 157 >> n/c>> 253, 287 - 2h after lunch: 94, 220 >> 135-150 >> 75, 121-145, 155 >> 120-152 >> 140-200 - before dinner: 183-237, 274 >> 150-269 >> 135 >> 140-190 >> n/c >> 162, 170 - 2h after dinner: n/c >> 123 >> 94-132 (no steroids), 220-420 (on steroids) >> 115-275 - bedtime:  272 >> 180 >> n/c >> 259 >> n/c >> 145 >> n/c>> 130-205 - nighttime: n/c Lowest sugar was 98 >> 75 >> 94; she has hypoglycemia awareness in the 90s. Highest sugar was 436 ...>> 260 (steroid inj) >> 420 (steroids).  Glucometer: One Probation officer - at last visit I advised her to get the new meter  Pt's meals are: - Breakfast: [place] bacon, 2 eggs, toast; oatmeal; or brown rice - Lunch: Tuna, crackers or sandwich, chicken strips, chips, diet Pepsi - Dinner: Meat, veggies, starch  - Snacks: 2-3: 15g carbs (pretzels, chips)  -+ CKD, last BUN/creatinine:  Lab Results  Component Value Date   BUN 40 (H) 12/23/2020   BUN 40 (H) 12/14/2020   CREATININE 1.39 (H) 12/23/2020   CREATININE 1.50 (H) 12/14/2020  11/21/2020: CMP normal, except glucose 106, BUN/creatinine 49/1.36, GFR 43 05/25/2020: CMP normal except: Glu 129, BUNs/creatinine 44/1.29, GFR 41, otherwise normal 12/31/2019: 42/1.37, GFR 38, glucose 193, otherwise normal CMP 03/21/2018: CMP normal with the exception of high glucose at 143, BUN/creatinine 37/1.26, GFR 43 (kidney function improved from 09/2017) Urinalysis with 1+ protein 09/13/2017: Glucose 123, BUN 44/creatinine 1.34, GFR 40, previously 44 03/06/2017: ACR 222.8 08/29/2016: ACR  76.7 02/16/2016: 42/1.34 (GFR 40), Glu 213  12/26/2015: 25/1.23 (GFR 44), Glu 153 Off lisinopril.  -+ HL; last set of lipids: 11/21/2020: 117/204/35/49 05/25/2020: 106/204/33/40 12/31/2019: 102/147/33/43 Lab Results  Component Value Date   CHOL 110 02/04/2019   HDL 31.60 (L) 02/04/2019   LDLDIRECT 44.0 02/04/2019   TRIG 255.0 (H) 02/04/2019   CHOLHDL 3 02/04/2019  03/21/2018: 92/202/31/21 09/04/2017: 126/269/33/39 08/29/2016: 87/132/32/30 02/16/2016: 128/278/33/39  03/02/2014: 114/175/32/48 On Zocor 40.  - last eye exam was 05/09/2021: No DR (Dr. Gershon Crane).  She has a history of cataract surgery.  -She has numbness and tingling in her feet.  She sees podiatry -Dr. Jacqualyn Posey.  She also has a history of HTN, GERD, anemia. She had amputation of 1/2 of the left hallux 05/06/2017.  This is now healed. She started Humira (for psoriasis) but did not tolerate this >> stopped. She started PT for her back pain. Also dry needling.   ROS: + See HPI Musculoskeletal: + muscle aches/+ joint aches Neurological: no tremors/+ numbness/+ tingling/no dizziness  I reviewed pt's medications, allergies, PMH, social hx, family hx, and changes were documented in the history of present illness. Otherwise, unchanged from my initial visit note.  Past Medical History:  Diagnosis Date   Allergy    seasonal   Anemia in chronic renal disease 06/05/2011   Anxiety    takes Ativan tid   Asthma    has an inhaler prn   Chronic kidney disease    Cough    DDD (degenerative disc disease), cervical 06/05/2011   DDD (degenerative disc disease), lumbosacral 06/05/2011   Depression    takes Cymbalta daily   Diabetes mellitus    takes Amaryl and Metformin daily   Diabetic neuropathy (HCC)    Diverticulosis    Dizziness    GERD (gastroesophageal reflux disease)    takes Protonix prn   Headache(784.0)    occasionally   History of bronchitis    last time 81yrs ago   History of colon polyps    History of MRSA  infection    HTN (hypertension) 06/05/2011   takes Prinizide daily   Hyperlipidemia    takes Zocor daily   Insomnia    not on any meds at present time   Osteoarthritis    PONV (postoperative nausea and vomiting)    hard to wake up   Psoriasis 06/05/2011   on legs and uses cream prn   Seasonal allergies    but doesn't take any meds   Sleep apnea    uses CPAP   Tubular adenoma of colon    Past Surgical History:  Procedure Laterality Date   ABDOMINAL HYSTERECTOMY  1982 and 1984   AMPUTATION  02/12/2012   Procedure: AMPUTATION RAY;  Surgeon: Wylene Simmer, MD;  Location: [facility];  Service: Orthopedics;  Laterality: Right;  RIGHT 2ND TOE AMPUTATION    ANTERIOR LAT LUMBAR FUSION N/A 12/22/2020   Procedure:  Thoracic twelve to Lumbar one posterior lateral lumbar interbody fusion with pedicle screw fixation Thoracic ten to Lumbar two with robot, posterior arthrodesis with allograft from Thoracic ten to Lumbar two;  Surgeon: Kristeen Miss, MD;  Location: [facility];  Service: Neurosurgery;  Laterality: N/A;   APPLICATION OF ROBOTIC ASSISTANCE FOR SPINAL PROCEDURE N/A 12/22/2020   Procedure: APPLICATION OF ROBOTIC ASSISTANCE FOR SPINAL PROCEDURE;  Surgeon: Kristeen Miss, MD;  Location: [facility];  Service: Neurosurgery;  Laterality: N/A;  3C/RM 19   BACK SURGERY     x 3   CARDIAC CATHETERIZATION     08/09/04: 50% mid AV groove CX lesion, NL LM, LAD, Ramus, EF > 60% (Dr. Gwenlyn Found)   [place] Bilateral 2010   [place]   COLONOSCOPY  05/30/2009, 09/30/13   [place] 2011/Dr.Brodie 2015   COLONOSCOPY W/ POLYPECTOMY  06/23/2019   DILATION AND CURETTAGE OF UTERUS     ELBOW SURGERY     left d/t tendonitis   ESOPHAGOGASTRODUODENOSCOPY     LUMBAR WOUND DEBRIDEMENT N/A 07/15/2012   Procedure: I & D of Lumbar Wound: Possible Repair of CSF Leak;  Surgeon: Elaina Hoops, MD;  Location: [facility];  Service: Neurosurgery;  Laterality: N/A;  I & D of Lumbar Wound:  Possible Repair of CSF Leak placement lumbar drain   POLYPECTOMY     right knee arthroscopy     x 2   SHOULDER ARTHROSCOPY Left    TOE AMPUTATION Bilateral    2nd toe   Social History   Social History   Marital status: Married    Spouse name: N/A   Number of children: 2   Occupational History   Disabled    Social History Main Topics   Smoking status: Never Smoker   Smokeless tobacco: Never Used     Comment: never used tobacco   Alcohol use No   Drug use: No   Current Outpatient Medications on File Prior to Visit  Medication Sig Dispense Refill   albuterol (PROVENTIL HFA;VENTOLIN HFA) 108 (90 BASE) MCG/ACT inhaler Inhale 1 puff into the lungs every 4 (four) hours as needed for wheezing or shortness of breath.      aspirin 81 MG tablet Take 81 mg by mouth daily.     budesonide-formoterol (SYMBICORT) 160-4.5 MCG/ACT inhaler Inhale 1 puff into the lungs 2 (two) times daily as needed (shortness of breath).     Cholecalciferol (VITAMIN D) 50 MCG (2000 UT) tablet Take 2,000 Units by mouth daily.     clindamycin (CLEOCIN) 300 MG capsule Take 1 capsule (300 mg total) by mouth 3 (three) times daily. 21 capsule 0   clobetasol (OLUX) 0.05 % topical foam Apply 1 application topically daily.     COLLAGEN PO Take 2 capsules by mouth daily.     doxycycline (VIBRA-TABS) 100 MG tablet Take 1 tablet (100 mg total) by mouth 2 (two) times daily. 20 tablet 0   DULoxetine (CYMBALTA) 30 MG capsule Take 30-60 mg by mouth See admin instructions. Take 60 mg in the morning and 30 mg  5   empagliflozin (JARDIANCE) 25 MG TABS tablet Take 1 tablet (25 mg total) by mouth daily. 90 tablet 3   gabapentin (NEURONTIN) 100 MG capsule Take 100 mg by mouth at bedtime.     gabapentin (NEURONTIN) 100 MG capsule Take by mouth.     glipiZIDE (GLUCOTROL) 5 MG tablet Take 1 tablet (5 mg total) by mouth 2 (  two) times daily before a meal. 180 tablet 3   hydrocortisone cream 1 % Apply 1 application topically 2 (two) times  daily as needed (rash).     insulin aspart (NOVOLOG) 100 UNIT/ML injection Inject 12-16 Units into the skin 3 (three) times daily before meals. (Patient taking differently: Inject 10 Units into the skin 3 (three) times daily before meals.) 30 mL 3   insulin degludec (TRESIBA FLEXTOUCH) 200 UNIT/ML FlexTouch Pen Inject 52 units into the skin daily (Patient taking differently: Inject 52 Units into the skin at bedtime.) 18 mL 2   Insulin Pen Needle 32G X 5 MM MISC Use up to 4 needles as directed to inject insulin 400 each 3   Insulin Syringe-Needle U-100 (INSULIN SYRINGE 1CC/31GX5/16") 31G X 5/16" 1 ML MISC Use 4 syringes per day to inject insulin 400 each 3   Ketotifen Fumarate (ALLERGY EYE DROPS OP) Place 1 drop into both eyes daily as needed (allergies).     Lancets (ONETOUCH ULTRASOFT) lancets      lisinopril-hydrochlorothiazide (ZESTORETIC) 20-12.5 MG tablet Take 2 tablets by mouth daily.     LORazepam (ATIVAN) 1 MG tablet Take 1 mg by mouth 2 (two) times daily as needed for anxiety.     metoprolol succinate (TOPROL-XL) 100 MG 24 hr tablet Take 100 mg by mouth daily. Take with or immediately following a meal.     Multiple Vitamins-Minerals (MULTIVITAMIN WITH MINERALS) tablet Take 1 tablet by mouth daily.      Needle, Disp, 32G X 5/16" MISC by Does not apply route.     ondansetron (ZOFRAN) 8 MG tablet Take 8 mg by mouth every 8 (eight) hours as needed for nausea or vomiting.     ONETOUCH VERIO test strip USE AS INSTRUCTED TO TEST TWICE DAILY 200 strip 1   pantoprazole (PROTONIX) 40 MG tablet Take 40 mg by mouth daily.     Probiotic Product (PROBIOTIC DAILY PO) Take 1 capsule by mouth daily.      simvastatin (ZOCOR) 40 MG tablet Take 40 mg by mouth daily in the afternoon.     triamcinolone (KENALOG) 0.025 % ointment Apply 1 application topically 2 (two) times daily. (Patient taking differently: Apply 1 application topically 2 (two) times daily as needed (toe ulcer).) 30 g 0   No current  facility-administered medications on file prior to visit.   Allergies  Allergen Reactions   Victoza [Liraglutide] Hives and Rash   Hydromorphone Hives, Diarrhea, Swelling and Rash   Ultram [Tramadol] Rash   Acetaminophen     Pt was told not to take   Betadine [Povidone Iodine] Other (See Comments)    Skin peels   Bydureon [Exenatide] Diarrhea and Nausea And Vomiting   Chlorhexidine     Exacerbates psoriasis    Codeine Other (See Comments)    Severe headaches   Contrast Media [Iodinated Contrast Media] Nausea And Vomiting   Fluoxetine Other (See Comments)   Humira [Adalimumab] Hives   Hydrocodone Other (See Comments)    headache   Invokana [Canagliflozin] Other (See Comments)    Cannot take this 2/2 PAD and toe amputations   Iodine Hives   Januvia [Sitagliptin] Diarrhea   Morphine And Related Nausea And Vomiting   Olanzapine Other (See Comments)   Ozempic (0.25 Or 0.5 Mg-Dose) [Semaglutide(0.25 Or 0.5mg -Dos)] Nausea And Vomiting   Percocet [Oxycodone-Acetaminophen] Other (See Comments)    Headache    Prednisone Diarrhea and Nausea And Vomiting   Sulfa Antibiotics Itching   Valium Other (See  Comments)    headache   Vortioxetine Other (See Comments)   Amoxicillin Hives and Rash    Did it involve swelling of the face/tongue/throat, SOB, or low BP? Unknown Did it involve sudden or severe rash/hives, skin peeling, or any reaction on the inside of your mouth or nose? Yes Did you need to seek medical attention at a hospital or doctor's office? Yes When did it last happen?      unk If all above answers are NO, may proceed with cephalosporin use.    Darvocet [Propoxyphene N-Acetaminophen] Rash and Other (See Comments)    headache   Fentanyl And Related Rash   Wellbutrin [Bupropion Hcl] Hives and Rash   Family History  Problem Relation Age of Onset   Hypertension Mother        died from infection s/p hip surgery   Heart attack Father    Colon polyps Brother    Colon  cancer Brother 44   Colon cancer Maternal Grandfather    Stomach cancer Neg Hx    Esophageal cancer Neg Hx    Rectal cancer Neg Hx    Liver cancer Neg Hx    PE: BP (!) 138/58 (BP Location: Right Arm, Patient Position: Sitting, Cuff Size: Normal)    Pulse 69    Ht 5\' 3"  (1.6 m)    Wt 237 lb 3.2 oz (107.6 kg)    SpO2 99%    BMI 42.02 kg/m  Wt Readings from Last 3 Encounters:  05/31/21 237 lb 3.2 oz (107.6 kg)  04/18/21 230 lb (104.3 kg)  02/21/21 233 lb 6.4 oz (105.9 kg)   Constitutional: overweight, in NAD, walks with a cane Eyes: PERRLA, EOMI, no exophthalmos ENT: moist mucous membranes, no thyromegaly, no cervical lymphadenopathy Cardiovascular: RRR, No MRG Respiratory: CTA B Musculoskeletal: no deformities, strength intact in all 4, L foot in boot Skin: moist, warm, no rashes Neurological: no tremor with outstretched hands, DTR normal in all 4  ASSESSMENT: 1. DM2, insulin-dependent, controlled, with complications: - PN - s/p B 2nd toe amputations 2/2 osteomyelitis - 2013 and 2015; 1/2 hallux 04/2017 - PAD -we cannot do the Invokana for her  - foot ulcer  2. HL  3.  Obesity class II  PLAN:  1. Patient with longstanding, uncontrolled, type 2 diabetes, on SGLT2 inhibitor, sulfonylurea, and basal-bolus insulin regimen, with still poor control.  She previously had steroid injections for back pain but before last visit she had lumbar spine fusion surgery with good results.  Her back pain decreased significantly but right before last visit, she started to have back pain again. -We tried Ozempic in the past but she developed diverticulitis and had to stop it.  We tried it again after her diverticulitis episode resolved, but she again developed abdominal pain and we had to stop. -At last visit, sugars were mostly at goal when she was off steroids, but she had repeated steroid injections in back or shoulder with blood sugars up to 400s.  We increased her NovoLog doses.  HbA1c at last  visit was high, at 8.5%. -At today's visit, she tells me that she continues to get steroid injections since last visit.  Due to these and also due to the holidays, during which she relaxed her diet, her sugars are higher.  Moreover, upon questioning, she did not increase the dose of NovoLog as advised at last visit, but varied the dose only between 10 to 12 units per meal rather than 12 to 20 units.  As a consequence, at today's visit, per review of her sugar log, sugars are variable, at all times of the day, but mostly above target.  We discussed again about how to increase the dose of NovoLog.  I advised her that if she needs to get another steroid injection, she needs 20 units of NovoLog before each meal. - I suggested to:  Patient Instructions  Please continue: - Jardiance 25 mg daily before b'fast - Glipizide 5 mg 2x a day before meals - Tresiba 52 units at bedtime   Please increase: - Novolog 12-16 units 15 min before a meal 3x a day (up to 20 units with steroids)  Please come back for a follow-up appointment in 3 months.  - we checked her HbA1c: 8.5% (stable, high) - advised to check sugars at different times of the day - 3-4x a day, rotating check times - advised for yearly eye exams >> she is UTD - return to clinic in 3 months  2. HL -Reviewed latest lipid panel from 11/2020: Fractions at goal with the exception of high triglycerides -Continues on Zocor 40 mg daily without side effects  3.  Obesity class II -She continues on Jardiance, which should also help with weight loss -Unfortunately, she could not tolerate Ozempic due to diverticulitis and abdominal pain.  This would have helped with weight loss -She gained 7 pounds before the last 2 visits combined -gained 4 more lbs since last OV -We discussed at this visit about the need to improve her diet, now that the holidays are over  Philemon Kingdom, MD PhD [facility]

## 2021-05-31 NOTE — Patient Instructions (Addendum)
Please continue: - Jardiance 25 mg daily before b'fast - Glipizide 5 mg 2x a day before meals - Tresiba 52 units at bedtime   Please increase: - Novolog 12-16 units 15 min before a meal 3x a day (up to 20 units with steroids)  Please come back for a follow-up appointment in 3 months.

## 2021-05-31 NOTE — Addendum Note (Signed)
Addended by: Lauralyn Primes on: 05/31/2021 11:33 AM   Modules accepted: Orders

## 2021-06-01 ENCOUNTER — Ambulatory Visit (AMBULATORY_SURGERY_CENTER): Payer: HMO | Admitting: Internal Medicine

## 2021-06-01 ENCOUNTER — Encounter: Payer: Self-pay | Admitting: Internal Medicine

## 2021-06-01 VITALS — BP 140/52 | HR 65 | Temp 98.6°F | Resp 10 | Ht 63.0 in | Wt 230.0 lb

## 2021-06-01 DIAGNOSIS — D124 Benign neoplasm of descending colon: Secondary | ICD-10-CM | POA: Diagnosis not present

## 2021-06-01 DIAGNOSIS — D122 Benign neoplasm of ascending colon: Secondary | ICD-10-CM

## 2021-06-01 DIAGNOSIS — I10 Essential (primary) hypertension: Secondary | ICD-10-CM | POA: Diagnosis not present

## 2021-06-01 DIAGNOSIS — D123 Benign neoplasm of transverse colon: Secondary | ICD-10-CM | POA: Diagnosis not present

## 2021-06-01 DIAGNOSIS — Z800 Family history of malignant neoplasm of digestive organs: Secondary | ICD-10-CM

## 2021-06-01 DIAGNOSIS — G4733 Obstructive sleep apnea (adult) (pediatric): Secondary | ICD-10-CM | POA: Diagnosis not present

## 2021-06-01 DIAGNOSIS — Z86010 Personal history of colonic polyps: Secondary | ICD-10-CM

## 2021-06-01 DIAGNOSIS — E119 Type 2 diabetes mellitus without complications: Secondary | ICD-10-CM | POA: Diagnosis not present

## 2021-06-01 MED ORDER — SODIUM CHLORIDE 0.9 % IV SOLN: 500.0000 mL | Freq: Once | INTRAVENOUS | Status: DC

## 2021-06-01 NOTE — Progress Notes (Signed)
[id number] Report given to PACU, vss

## 2021-06-01 NOTE — Progress Notes (Signed)
Vitals-CW  Pt's states no medical or surgical changes since previsit or office visit. 

## 2021-06-01 NOTE — Progress Notes (Signed)
Called to room to assist during endoscopic procedure.  Patient ID and intended procedure confirmed with present staff. Received instructions for my participation in the procedure from the performing physician.  

## 2021-06-01 NOTE — Progress Notes (Signed)
GASTROENTEROLOGY PROCEDURE H&P NOTE   Primary Care Physician: Mayra Neer, MD    Reason for Procedure:  History of colon polyps  Plan:    Colonoscopy  Patient is appropriate for endoscopic procedure(s) in the ambulatory ([facility]) setting.  The nature of the procedure, as well as the risks, benefits, and alternatives were carefully and thoroughly reviewed with the patient. Ample time for discussion and questions allowed. The patient understood, was satisfied, and agreed to proceed.     HPI: [name] is a 71 y.o. female who presents for surveillance colonoscopy.  Colonoscopy 1 year ago with greater than 10 adenomas removed.  Tolerated the prep.  No recent chest pain or shortness of breath.  Medical history as well.  Past Medical History:  Diagnosis Date   Allergy    seasonal   Anemia in chronic renal disease 06/05/2011   Anxiety    takes Ativan tid   Asthma    has an inhaler prn   Chronic kidney disease    Cough    DDD (degenerative disc disease), cervical 06/05/2011   DDD (degenerative disc disease), lumbosacral 06/05/2011   Depression    takes Cymbalta daily   Diabetes mellitus    takes Amaryl and Metformin daily   Diabetic neuropathy (HCC)    Diverticulosis    Dizziness    GERD (gastroesophageal reflux disease)    takes Protonix prn   Headache(784.0)    occasionally   History of bronchitis    last time 55yrs ago   History of colon polyps    History of MRSA infection    HTN (hypertension) 06/05/2011   takes Prinizide daily   Hyperlipidemia    takes Zocor daily   Insomnia    not on any meds at present time   Osteoarthritis    PONV (postoperative nausea and vomiting)    hard to wake up   Psoriasis 06/05/2011   on legs and uses cream prn   Seasonal allergies    but doesn't take any meds   Sleep apnea    uses CPAP   Tubular adenoma of colon     Past Surgical History:  Procedure Laterality Date   ABDOMINAL HYSTERECTOMY  1982 and 1984    AMPUTATION  02/12/2012   Procedure: AMPUTATION RAY;  Surgeon: Wylene Simmer, MD;  Location: [facility];  Service: Orthopedics;  Laterality: Right;  RIGHT 2ND TOE AMPUTATION    ANTERIOR LAT LUMBAR FUSION N/A 12/22/2020   Procedure: Thoracic twelve to Lumbar one posterior lateral lumbar interbody fusion with pedicle screw fixation Thoracic ten to Lumbar two with robot, posterior arthrodesis with allograft from Thoracic ten to Lumbar two;  Surgeon: Kristeen Miss, MD;  Location: [facility];  Service: Neurosurgery;  Laterality: N/A;   APPLICATION OF ROBOTIC ASSISTANCE FOR SPINAL PROCEDURE N/A 12/22/2020   Procedure: APPLICATION OF ROBOTIC ASSISTANCE FOR SPINAL PROCEDURE;  Surgeon: Kristeen Miss, MD;  Location: [facility];  Service: Neurosurgery;  Laterality: N/A;  3C/RM 19   BACK SURGERY     x 3   CARDIAC CATHETERIZATION     08/09/04: 50% mid AV groove CX lesion, NL LM, LAD, Ramus, EF > 60% (Dr. Gwenlyn Found)   [place] Bilateral 2010   [place]   COLONOSCOPY  05/30/2009, 09/30/13   [facility] 2011/Dr.Brodie 2015   COLONOSCOPY W/ POLYPECTOMY  06/23/2019   DILATION AND CURETTAGE OF UTERUS     ELBOW SURGERY     left d/t  tendonitis   ESOPHAGOGASTRODUODENOSCOPY     LUMBAR WOUND DEBRIDEMENT N/A 07/15/2012   Procedure: I & D of Lumbar Wound: Possible Repair of CSF Leak;  Surgeon: Elaina Hoops, MD;  Location: [facility];  Service: Neurosurgery;  Laterality: N/A;  I & D of Lumbar Wound: Possible Repair of CSF Leak placement lumbar drain   POLYPECTOMY     right knee arthroscopy     x 2   SHOULDER ARTHROSCOPY Left    TOE AMPUTATION Bilateral    2nd toe    Prior to Admission medications   Medication Sig Start Date End Date Taking? Authorizing Provider  aspirin 81 MG tablet Take 81 mg by mouth daily.   Yes [provider]  Cholecalciferol (VITAMIN D) 50 MCG (2000 UT) tablet Take 2,000 Units by mouth daily.   Yes [provider]  COLLAGEN PO Take 2 capsules by  mouth daily.   Yes [provider]  DULoxetine (CYMBALTA) 30 MG capsule Take 30-60 mg by mouth See admin instructions. Take 60 mg in the morning and 30 mg 03/04/16  Yes [provider]  empagliflozin (JARDIANCE) 25 MG TABS tablet Take 1 tablet (25 mg total) by mouth daily. 02/21/21  Yes Philemon Kingdom, MD  gabapentin (NEURONTIN) 100 MG capsule Take by mouth. 12/09/20  Yes [provider]  glipiZIDE (GLUCOTROL) 5 MG tablet Take 1 tablet (5 mg total) by mouth 2 (two) times daily before a meal. 02/21/21  Yes Philemon Kingdom, MD  insulin aspart (NOVOLOG) 100 UNIT/ML injection Inject 12-16 Units into the skin 3 (three) times daily before meals. Patient taking differently: Inject 10 Units into the skin 3 (three) times daily before meals. 02/21/21  Yes Philemon Kingdom, MD  insulin degludec (TRESIBA FLEXTOUCH) 200 UNIT/ML FlexTouch Pen Inject 52 units into the skin daily Patient taking differently: Inject 52 Units into the skin at bedtime. 11/23/20  Yes Philemon Kingdom, MD  Insulin Pen Needle 32G X 5 MM MISC Use up to 4 needles as directed to inject insulin 03/15/21  Yes Philemon Kingdom, MD  Insulin Syringe-Needle U-100 (INSULIN SYRINGE 1CC/31GX5/16") 31G X 5/16" 1 ML MISC Use 4 syringes per day to inject insulin 03/15/21  Yes Philemon Kingdom, MD  Lancets [facility] ULTRASOFT) lancets  06/24/20  Yes [provider]  lisinopril-hydrochlorothiazide (ZESTORETIC) 20-12.5 MG tablet Take 2 tablets by mouth daily. 11/17/20  Yes [provider]  LORazepam (ATIVAN) 1 MG tablet Take 1 mg by mouth 2 (two) times daily as needed for anxiety.   Yes [provider]  metoprolol succinate (TOPROL-XL) 100 MG 24 hr tablet Take 100 mg by mouth daily. Take with or immediately following a meal.   Yes [provider]  Multiple Vitamins-Minerals (MULTIVITAMIN WITH MINERALS) tablet Take 1 tablet by mouth daily.    Yes [provider]  Needle, Disp, 32G X  5/16" MISC by Does not apply route.   Yes [provider]  [facility] VERIO test strip USE AS INSTRUCTED TO TEST TWICE DAILY 05/09/21  Yes Philemon Kingdom, MD  pantoprazole (PROTONIX) 40 MG tablet Take 40 mg by mouth daily.   Yes [provider]  Probiotic Product (PROBIOTIC DAILY PO) Take 1 capsule by mouth daily.    Yes [provider]  simvastatin (ZOCOR) 40 MG tablet Take 40 mg by mouth daily in the afternoon.   Yes [provider]  triamcinolone (KENALOG) 0.025 % ointment Apply 1 application topically 2 (two) times daily. Patient taking differently: Apply 1 application topically  2 (two) times daily as needed (toe ulcer). 10/08/19  Yes Trula Slade, DPM  albuterol (PROVENTIL HFA;VENTOLIN HFA) 108 (90 BASE) MCG/ACT inhaler Inhale 1 puff into the lungs every 4 (four) hours as needed for wheezing or shortness of breath.     [provider]  budesonide-formoterol (SYMBICORT) 160-4.5 MCG/ACT inhaler Inhale 1 puff into the lungs 2 (two) times daily as needed (shortness of breath).    [provider]  clobetasol (OLUX) 0.05 % topical foam Apply 1 application topically daily. 02/05/12   [provider]  hydrocortisone cream 1 % Apply 1 application topically 2 (two) times daily as needed (rash).    [provider]  ondansetron (ZOFRAN) 8 MG tablet Take 8 mg by mouth every 8 (eight) hours as needed for nausea or vomiting.    [provider]    Current Outpatient Medications  Medication Sig Dispense Refill   aspirin 81 MG tablet Take 81 mg by mouth daily.     Cholecalciferol (VITAMIN D) 50 MCG (2000 UT) tablet Take 2,000 Units by mouth daily.     COLLAGEN PO Take 2 capsules by mouth daily.     DULoxetine (CYMBALTA) 30 MG capsule Take 30-60 mg by mouth See admin instructions. Take 60 mg in the morning and 30 mg  5   empagliflozin (JARDIANCE) 25 MG TABS tablet Take 1 tablet (25 mg total) by mouth daily. 90 tablet 3    gabapentin (NEURONTIN) 100 MG capsule Take by mouth.     glipiZIDE (GLUCOTROL) 5 MG tablet Take 1 tablet (5 mg total) by mouth 2 (two) times daily before a meal. 180 tablet 3   insulin aspart (NOVOLOG) 100 UNIT/ML injection Inject 12-16 Units into the skin 3 (three) times daily before meals. (Patient taking differently: Inject 10 Units into the skin 3 (three) times daily before meals.) 30 mL 3   insulin degludec (TRESIBA FLEXTOUCH) 200 UNIT/ML FlexTouch Pen Inject 52 units into the skin daily (Patient taking differently: Inject 52 Units into the skin at bedtime.) 18 mL 2   Insulin Pen Needle 32G X 5 MM MISC Use up to 4 needles as directed to inject insulin 400 each 3   Insulin Syringe-Needle U-100 (INSULIN SYRINGE 1CC/31GX5/16") 31G X 5/16" 1 ML MISC Use 4 syringes per day to inject insulin 400 each 3   Lancets (ONETOUCH ULTRASOFT) lancets      lisinopril-hydrochlorothiazide (ZESTORETIC) 20-12.5 MG tablet Take 2 tablets by mouth daily.     LORazepam (ATIVAN) 1 MG tablet Take 1 mg by mouth 2 (two) times daily as needed for anxiety.     metoprolol succinate (TOPROL-XL) 100 MG 24 hr tablet Take 100 mg by mouth daily. Take with or immediately following a meal.     Multiple Vitamins-Minerals (MULTIVITAMIN WITH MINERALS) tablet Take 1 tablet by mouth daily.      Needle, Disp, 32G X 5/16" MISC by Does not apply route.     ONETOUCH VERIO test strip USE AS INSTRUCTED TO TEST TWICE DAILY 200 strip 1   pantoprazole (PROTONIX) 40 MG tablet Take 40 mg by mouth daily.     Probiotic Product (PROBIOTIC DAILY PO) Take 1 capsule by mouth daily.      simvastatin (ZOCOR) 40 MG tablet Take 40 mg by mouth daily in the afternoon.     triamcinolone (KENALOG) 0.025 % ointment Apply 1 application topically 2 (two) times daily. (Patient taking differently: Apply 1 application topically 2 (two) times daily as needed (toe ulcer).) 30 g  0   albuterol (PROVENTIL HFA;VENTOLIN HFA) 108 (90 BASE) MCG/ACT inhaler Inhale 1 puff into  the lungs every 4 (four) hours as needed for wheezing or shortness of breath.      budesonide-formoterol (SYMBICORT) 160-4.5 MCG/ACT inhaler Inhale 1 puff into the lungs 2 (two) times daily as needed (shortness of breath).     clobetasol (OLUX) 0.05 % topical foam Apply 1 application topically daily.     hydrocortisone cream 1 % Apply 1 application topically 2 (two) times daily as needed (rash).     ondansetron (ZOFRAN) 8 MG tablet Take 8 mg by mouth every 8 (eight) hours as needed for nausea or vomiting.     Current Facility-Administered Medications  Medication Dose Route Frequency Provider Last Rate Last Admin   0.9 %  sodium chloride infusion  500 mL Intravenous Once Laiana Fratus, Lajuan Lines, MD        Allergies as of 06/01/2021 - Review Complete 06/01/2021  Allergen Reaction Noted   Victoza [liraglutide] Hives and Rash 04/06/2012   Hydromorphone Hives, Diarrhea, Swelling, and Rash 07/16/2013   Ultram [tramadol] Rash 10/30/2011   Acetaminophen  11/29/2020   Betadine [povidone iodine] Other (See Comments) 06/05/2011   Bydureon [exenatide] Diarrhea and Nausea And Vomiting 09/16/2013   Chlorhexidine  12/22/2020   Codeine Other (See Comments) 07/02/2012   Contrast media [iodinated contrast media] Nausea And Vomiting 06/05/2011   Fluoxetine Other (See Comments) 12/25/2020   Humira [adalimumab] Hives 09/24/2017   Hydrocodone Other (See Comments) 06/05/2011   Invokana [canagliflozin] Other (See Comments) 06/30/2020   Iodine Hives 06/05/2011   Januvia [sitagliptin] Diarrhea 06/26/2012   Morphine and related Nausea And Vomiting 06/05/2011   Olanzapine Other (See Comments) 12/25/2020   Ozempic (0.25 or 0.5 mg-dose) [semaglutide(0.25 or 0.5mg -dos)] Nausea And Vomiting 07/01/2019   Percocet [oxycodone-acetaminophen] Other (See Comments) 06/05/2011   Prednisone Diarrhea and Nausea And Vomiting 12/12/2020   Sulfa antibiotics Itching 06/05/2011   Valium Other (See Comments) 06/05/2011   Vortioxetine  Other (See Comments) 12/25/2020   Amoxicillin Hives and Rash 06/26/2012   Darvocet [propoxyphene n-acetaminophen] Rash and Other (See Comments) 06/05/2011   Fentanyl and related Rash 06/05/2011   Wellbutrin [bupropion hcl] Hives and Rash 06/05/2011    Family History  Problem Relation Age of Onset   Hypertension Mother        died from infection s/p hip surgery   Heart attack Father    Colon polyps Brother    Colon cancer Brother 69   Colon cancer Maternal Grandfather    Stomach cancer Neg Hx    Esophageal cancer Neg Hx    Rectal cancer Neg Hx    Liver cancer Neg Hx     Social History   Socioeconomic History   Marital status: Married    Spouse name: Not on file   Number of children: 2   Years of education: Not on file   Highest education level: Not on file  Occupational History   Occupation: disabled  Tobacco Use   Smoking status: Never   Smokeless tobacco: Never   Tobacco comments:    never used tobacco  Vaping Use   Vaping Use: Never used  Substance and Sexual Activity   Alcohol use: Never   Drug use: Never   Sexual activity: Not on file  Other Topics Concern   Not on file  Social History Narrative   Not on file   Social Determinants of Health   Financial Resource Strain: Not on file  Food Insecurity: Not  on file  Transportation Needs: Not on file  Physical Activity: Not on file  Stress: Not on file  Social Connections: Not on file  Intimate Partner Violence: Not on file    Physical Exam: Vital signs in last 24 hours: @BP  (!) 190/77    Pulse 78    Temp 98.6 F (37 C) (Temporal)    Resp 14    Ht 5\' 3"  (1.6 m)    Wt 230 lb (104.3 kg)    SpO2 100%    BMI 40.74 kg/m  GEN: NAD EYE: Sclerae anicteric ENT: MMM CV: Non-tachycardic Pulm: CTA b/l GI: Soft, NT/ND NEURO:  Alert & Oriented x 3   Zenovia Jarred, MD [facility]  06/01/2021 8:05 AM

## 2021-06-01 NOTE — Op Note (Signed)
[name] Patient Name: [name] Procedure Date: 06/01/2021 7:30 AM MRN: [id number] Endoscopist: Jerene Bears , MD Age: 71 Referring MD:  Date of Birth: 1950/09/29 Gender: Female Account #: [id number] Procedure:                Colonoscopy Indications:              High risk colon cancer surveillance: Personal                            history of multiple adenomas, Last colonoscopy:                            February 2021 (12 polyps removed) Medicines:                Monitored Anesthesia Care Procedure:                Pre-Anesthesia Assessment:                           - Prior to the procedure, a History and Physical                            was performed, and patient medications and                            allergies were reviewed. The patient's tolerance of                            previous anesthesia was also reviewed. The risks                            and benefits of the procedure and the sedation                            options and risks were discussed with the patient.                            All questions were answered, and informed consent                            was obtained. Prior Anticoagulants: The patient has                            taken no previous anticoagulant or antiplatelet                            agents. ASA Grade Assessment: III - A patient with                            severe systemic disease. After reviewing the risks                            and benefits, the patient was deemed in  satisfactory condition to undergo the procedure.                           After obtaining informed consent, the colonoscope                            was passed under direct vision. Throughout the                            procedure, the patient's blood pressure, pulse, and                            oxygen saturations were monitored continuously. The                            Olympus Colonoscope #[id number]  was introduced through                            the anus and advanced to the cecum, identified by                            appendiceal orifice and ileocecal valve. The                            colonoscopy was performed without difficulty. The                            patient tolerated the procedure well. The quality                            of the bowel preparation was good. The ileocecal                            valve, appendiceal orifice, and rectum were                            photographed. Scope In: 8:08:04 AM Scope Out: 8:33:00 AM Scope Withdrawal Time: 0 hours 17 minutes 58 seconds  Total Procedure Duration: 0 hours 24 minutes 56 seconds  Findings:                 The digital rectal exam was normal.                           A 7 mm polyp was found in the ascending colon. The                            polyp was sessile. The polyp was removed with a                            cold snare. Resection and retrieval were complete.                           Two sessile polyps were found in the transverse  colon. The polyps were 4 to 6 mm in size. These                            polyps were removed with a cold snare. Resection                            and retrieval were complete.                           Two sessile polyps were found in the descending                            colon. The polyps were 3 to 8 mm in size. These                            polyps were removed with a cold snare. Resection                            and retrieval were complete.                           Multiple small-mouthed diverticula were found in                            the sigmoid colon.                           Internal hemorrhoids were found during                            retroflexion. The hemorrhoids were medium-sized. Complications:            No immediate complications. Estimated Blood Loss:     Estimated blood loss was minimal. Impression:                - One 7 mm polyp in the ascending colon, removed                            with a cold snare. Resected and retrieved.                           - Two 4 to 6 mm polyps in the transverse colon,                            removed with a cold snare. Resected and retrieved.                           - Two 3 to 8 mm polyps in the descending colon,                            removed with a cold snare. Resected and retrieved.                           - Diverticulosis in  the sigmoid colon.                           - Internal hemorrhoids. Recommendation:           - Patient has a contact number available for                            emergencies. The signs and symptoms of potential                            delayed complications were discussed with the                            patient. Return to normal activities tomorrow.                            Written discharge instructions were provided to the                            patient.                           - Resume previous diet.                           - Continue present medications.                           - Await pathology results.                           - Repeat colonoscopy in 3 years for surveillance. Jerene Bears, MD 06/01/2021 8:36:28 AM This report has been signed electronically.

## 2021-06-01 NOTE — Patient Instructions (Signed)

## 2021-06-05 ENCOUNTER — Telehealth: Payer: Self-pay | Admitting: *Deleted

## 2021-06-05 NOTE — Telephone Encounter (Signed)
°  Follow up Call-  Call back number 06/01/2021 06/23/2019  Post procedure Call Back phone  # [phone]  Permission to leave phone message Yes Yes  Some recent data might be hidden     Patient questions:  Do you have a fever, pain , or abdominal swelling? No. Pain Score  0 *  Have you tolerated food without any problems? Yes.    Have you been able to return to your normal activities? Yes.    Do you have any questions about your discharge instructions: Diet   No. Medications  No. Follow up visit  No.  Do you have questions or concerns about your Care? No.  Actions: * If pain score is 4 or above: No action needed, pain <4.

## 2021-06-07 DIAGNOSIS — Z79899 Other long term (current) drug therapy: Secondary | ICD-10-CM | POA: Diagnosis not present

## 2021-06-07 DIAGNOSIS — L400 Psoriasis vulgaris: Secondary | ICD-10-CM | POA: Diagnosis not present

## 2021-06-08 ENCOUNTER — Encounter: Payer: Self-pay | Admitting: Internal Medicine

## 2021-06-08 DIAGNOSIS — Z79899 Other long term (current) drug therapy: Secondary | ICD-10-CM | POA: Diagnosis not present

## 2021-06-13 ENCOUNTER — Other Ambulatory Visit: Payer: Self-pay | Admitting: Podiatry

## 2021-06-13 ENCOUNTER — Ambulatory Visit: Payer: HMO | Admitting: Podiatry

## 2021-06-13 ENCOUNTER — Ambulatory Visit (INDEPENDENT_AMBULATORY_CARE_PROVIDER_SITE_OTHER): Payer: HMO

## 2021-06-13 ENCOUNTER — Other Ambulatory Visit: Payer: Self-pay

## 2021-06-13 DIAGNOSIS — L97522 Non-pressure chronic ulcer of other part of left foot with fat layer exposed: Secondary | ICD-10-CM | POA: Diagnosis not present

## 2021-06-13 DIAGNOSIS — L02612 Cutaneous abscess of left foot: Secondary | ICD-10-CM

## 2021-06-13 DIAGNOSIS — L03032 Cellulitis of left toe: Secondary | ICD-10-CM

## 2021-06-13 MED ORDER — DOXYCYCLINE HYCLATE 100 MG PO TABS: 100.0000 mg | ORAL_TABLET | Freq: Two times a day (BID) | ORAL | 0 refills | Status: DC

## 2021-06-14 LAB — CBC WITH DIFFERENTIAL/PLATELET
Absolute Monocytes: 988 cells/uL — ABNORMAL HIGH (ref 200–950)
Basophils Absolute: 60 cells/uL (ref 0–200)
Basophils Relative: 0.5 %
Eosinophils Absolute: 345 cells/uL (ref 15–500)
Eosinophils Relative: 2.9 %
HCT: 30.7 % — ABNORMAL LOW (ref 35.0–45.0)
Hemoglobin: 10.2 g/dL — ABNORMAL LOW (ref 11.7–15.5)
Lymphs Abs: 1702 cells/uL (ref 850–3900)
MCH: 29.9 pg (ref 27.0–33.0)
MCHC: 33.2 g/dL (ref 32.0–36.0)
MCV: 90 fL (ref 80.0–100.0)
MPV: 10.6 fL (ref 7.5–12.5)
Monocytes Relative: 8.3 %
Neutro Abs: 8806 cells/uL — ABNORMAL HIGH (ref 1500–7800)
Neutrophils Relative %: 74 %
Platelets: 337 10*3/uL (ref 140–400)
RBC: 3.41 10*6/uL — ABNORMAL LOW (ref 3.80–5.10)
RDW: 12.8 % (ref 11.0–15.0)
Total Lymphocyte: 14.3 %
WBC: 11.9 10*3/uL — ABNORMAL HIGH (ref 3.8–10.8)

## 2021-06-14 LAB — BASIC METABOLIC PANEL
BUN/Creatinine Ratio: 35 (calc) — ABNORMAL HIGH (ref 6–22)
BUN: 55 mg/dL — ABNORMAL HIGH (ref 7–25)
CO2: 24 mmol/L (ref 20–32)
Calcium: 8.9 mg/dL (ref 8.6–10.4)
Chloride: 103 mmol/L (ref 98–110)
Creat: 1.55 mg/dL — ABNORMAL HIGH (ref 0.50–1.05)
Glucose, Bld: 147 mg/dL — ABNORMAL HIGH (ref 65–139)
Potassium: 4.6 mmol/L (ref 3.5–5.3)
Sodium: 137 mmol/L (ref 135–146)

## 2021-06-14 LAB — SEDIMENTATION RATE: Sed Rate: 43 mm/h — ABNORMAL HIGH (ref 0–30)

## 2021-06-14 LAB — C-REACTIVE PROTEIN: CRP: 27.7 mg/L — ABNORMAL HIGH (ref <8.0)

## 2021-06-16 LAB — WOUND CULTURE
MICRO NUMBER:: 12974925
SPECIMEN QUALITY:: ADEQUATE

## 2021-06-16 LAB — HOUSE ACCOUNT TRACKING

## 2021-06-17 NOTE — Progress Notes (Signed)
Subjective: 71 year old female presents the office today for follow-up evaluation of a wound on the left fourth toe.  She states has been having some pain to the toe and some mild redness.  No red streaks.  She feels been healing well otherwise.  She said that she has more pain along the top of the foot is a post toe.  No injuries that she reports.  She denies any fevers or chills.    Objective: AAO x3, NAD DP/PT pulses palpable bilaterally, CRT less than 3 seconds Protective sensation decreased with Semmes Weinstein monofilament Hyperkeratotic lesion at the distal aspect of the left fourth toe.  This appears to be right underneath the toenail as well.  Upon debridement there was purulence noted and there is probing very close to the bone.  There is no fluctuation or crepitation.  There is localized edema and erythema to the distal portion of toe.  There is no fluctuation or crepitation.  Hammertoe deformity noted. Hammertoes present. No pain with calf compression, swelling, warmth, erythema  Assessment: Healed ulceration left fourth toe, osteomyelitis ulceration left fourth toe  Plan: -Treatment options discussed including all alternatives, risks, and complications -Etiology of symptoms were discussed -X-rays were obtained and reviewed with the patient.  Osteolysis of the distal phalanx consistent with osteomyelitis. -I sharply debride the hyperkeratotic lesion without any complications however there was purulence noted which I took a wound culture of today.  I cleansed the wound with saline.  Small amount of antibiotic ointment and dressing applied. -Had a long discussion with the patient regards to treatment options.  My recommendation osteomyelitis would be to proceed with amputation to remove the infection.  I have ordered blood work including sed rate, CRP, CBC, BMP. -Doxycycline ordered. -Hopefully we can do a partial amputation of the toe.  At this would also benefit her long-term with  the toe curls quite a bit and this will become a recurrent issue.  I will see her back next week and likely schedule for surgery but she is to monitor very closely for any signs or symptoms of worsening infection report to the emergency department immediately should any occur.  I like to see if we can get the erythema down now that I have drained the abscess.   35 minutes were spent with the patient greater than 50% of the time was in face-to-face contact.  We spent the majority of time discussing surgery versus conservative care.  Trula Slade DPM    -Debrided hyperkeratotic tissue today and the wound is healed.  She still needs to monitor this very closely for any reoccurrence given the adductovarus that is noted at the toe.  Continue offloading pads and shoe modifications.  She can start to transition back to diabetic shoe as tolerated with daily foot inspection monitor for any changes and if anything were to change to let me know immediately. -ABI in the office: Left was 1.06 right 1.09 -Blood work: CBC, BMP, ESR, CRP-still pending-at this point the wound is healed. Will hold off for now but will recheck if any recurrence.   Trula Slade DPM

## 2021-06-21 ENCOUNTER — Encounter: Payer: Self-pay | Admitting: Internal Medicine

## 2021-06-21 ENCOUNTER — Other Ambulatory Visit: Payer: Self-pay | Admitting: Internal Medicine

## 2021-06-21 DIAGNOSIS — E1159 Type 2 diabetes mellitus with other circulatory complications: Secondary | ICD-10-CM

## 2021-06-21 MED ORDER — INSULIN ASPART 100 UNIT/ML IJ SOLN: 12.0000 [IU] | Freq: Three times a day (TID) | INTRAMUSCULAR | 2 refills | Status: DC

## 2021-06-23 ENCOUNTER — Ambulatory Visit: Payer: HMO

## 2021-06-23 ENCOUNTER — Other Ambulatory Visit: Payer: Self-pay

## 2021-06-23 ENCOUNTER — Ambulatory Visit: Payer: HMO | Admitting: Podiatry

## 2021-06-23 DIAGNOSIS — L97522 Non-pressure chronic ulcer of other part of left foot with fat layer exposed: Secondary | ICD-10-CM | POA: Diagnosis not present

## 2021-06-23 DIAGNOSIS — M86172 Other acute osteomyelitis, left ankle and foot: Secondary | ICD-10-CM

## 2021-06-23 DIAGNOSIS — G4733 Obstructive sleep apnea (adult) (pediatric): Secondary | ICD-10-CM | POA: Diagnosis not present

## 2021-06-23 NOTE — Patient Instructions (Signed)

## 2021-06-23 NOTE — Progress Notes (Signed)
Subjective: 71 year old female presents the office today for follow-up evaluation of infection to the left fourth toe.  She states is not hurting like it was distress reports decreased swelling redness of the toe.  No significant drainage that she reports.  She describes a little burning sensation to the toe.  She remains on doxycycline wearing a surgical shoe.  She denies any fevers, chills, nausea, vomiting.  No chest pain, shortness of breath or calf pain.  Objective: AAO x3, NAD DP/PT pulses palpable bilaterally, CRT less than 3 seconds Adductovarus present of the left fourth toe.  There is an ulceration of the distal portion of toe which does probe directly down to the bone.  There is no localized edema and erythema present to the distal portion of toe but there is no fluctuation or crepitation.  There is no malodor.  There is no ascending cellulitis. No pain with calf compression, swelling, warmth, erythema  Assessment: Osteomyelitis left fourth toe  Plan: -All treatment options discussed with the patient including all alternatives, risks, complications.  -Repeat x-rays obtained reviewed.  Cortical changes of the distal phalanx consistent with osteomyelitis and clinically the wound does probe down to bone as well. -At this point recommended amputation at least partial toe.  After discussion she agrees to proceed with this we will plan on doing surgery next week.  Discussed with her partial versus total toe amputation. -The incision placement as well as the postoperative course was discussed with the patient. I discussed risks of the surgery which include, but not limited to, infection, bleeding, pain, swelling, need for further surgery, delayed or nonhealing, painful or ugly scar, numbness or sensation changes, over/under correction, recurrence, transfer lesions, further deformity, DVT/PE, loss of toe/foot. Patient understands these risks and wishes to proceed with surgery. The surgical consent  was reviewed with the patient all 3 pages were signed. No promises or guarantees were given to the outcome of the procedure. All questions were answered to the best of my ability. Before the surgery the patient was encouraged to call the office if there is any further questions. The surgery will be performed at the [facility] on an outpatient basis. -No antibiotics for now as well as daily dressing changes.  Should there be any signs or symptoms of worsening infection prior to surgery to go to the emergency room. -Patient encouraged to call the office with any questions, concerns, change in symptoms.   Trula Slade DPM

## 2021-06-27 ENCOUNTER — Telehealth: Payer: Self-pay | Admitting: Urology

## 2021-06-27 NOTE — Telephone Encounter (Signed)
DOS - 06/28/21  AMPUTATION TOE IPJ 4TH LEFT --- 84039  HTA EFFECTIVE DATE - 05/07/20  RECEIVED FAX FROM [facility] STATING THAT CPT CODE 79536 HAS BEEN APPROVED, AUTH # [id number], GOOD FROM 06/28/21 - 09/26/21.

## 2021-06-28 ENCOUNTER — Encounter: Payer: Self-pay | Admitting: Podiatry

## 2021-06-28 ENCOUNTER — Other Ambulatory Visit: Payer: Self-pay | Admitting: Podiatry

## 2021-06-28 DIAGNOSIS — M2042 Other hammer toe(s) (acquired), left foot: Secondary | ICD-10-CM | POA: Diagnosis not present

## 2021-06-28 DIAGNOSIS — D7589 Other specified diseases of blood and blood-forming organs: Secondary | ICD-10-CM | POA: Diagnosis not present

## 2021-06-28 DIAGNOSIS — M86172 Other acute osteomyelitis, left ankle and foot: Secondary | ICD-10-CM | POA: Diagnosis not present

## 2021-06-28 DIAGNOSIS — M86672 Other chronic osteomyelitis, left ankle and foot: Secondary | ICD-10-CM | POA: Diagnosis not present

## 2021-06-28 MED ORDER — DOXYCYCLINE HYCLATE 100 MG PO TABS: 100.0000 mg | ORAL_TABLET | Freq: Two times a day (BID) | ORAL | 0 refills | Status: DC

## 2021-06-28 MED ORDER — TRAMADOL HCL 50 MG PO TABS: 50.0000 mg | ORAL_TABLET | Freq: Three times a day (TID) | ORAL | 0 refills | Status: AC | PRN

## 2021-06-28 NOTE — Progress Notes (Signed)
Post-op medications sent.   I sent tramadol. She told me she has done OK with this previously. The documented allergy is a rash. She will take only if needed.

## 2021-07-03 ENCOUNTER — Ambulatory Visit (INDEPENDENT_AMBULATORY_CARE_PROVIDER_SITE_OTHER): Payer: HMO

## 2021-07-03 ENCOUNTER — Other Ambulatory Visit: Payer: Self-pay

## 2021-07-03 ENCOUNTER — Ambulatory Visit (INDEPENDENT_AMBULATORY_CARE_PROVIDER_SITE_OTHER): Payer: HMO | Admitting: Podiatry

## 2021-07-03 DIAGNOSIS — Z9889 Other specified postprocedural states: Secondary | ICD-10-CM | POA: Diagnosis not present

## 2021-07-03 DIAGNOSIS — M869 Osteomyelitis, unspecified: Secondary | ICD-10-CM

## 2021-07-09 NOTE — Progress Notes (Signed)
Subjective: [name] is a 71 y.o. is seen today in office s/p left partial fourth toe amputation preformed on 06/28/2021.  She has been doing well not having any significant pain.  She gets some swelling she has noticed but she denies any fevers or chills.  She still on antibiotics as well.    Objective: General: No acute distress, AAOx3  DP/PT pulses palpable 2/4, CRT < 3 sec to all digits.  Left foot: Incision is well coapted without any evidence of dehiscence with sutures intact. There is no cellulitis, ascending cellulitis, fluctuance, crepitus, malodor, drainage/purulence. There is minimal edema around the surgical site. There is no pain along the surgical site.  No other areas of tenderness to bilateral lower extremities.  No other open lesions or pre-ulcerative lesions.  No pain with calf compression, swelling, warmth, erythema.   Assessment and Plan:  Status post left partial fourth amputation, doing well with no complications   -Treatment options discussed including all alternatives, risks, and complications -X-rays obtained reviewed.  No evidence of acute fracture, osteomyelitis.  Digital contractures noted. -Incision was cleaned.  Small amount of antibiotic ointment and a bandage applied.  Keep dressing clean, dry, intact -Remain in surgical shoe -Elevation -Pain medication as needed. -Monitor for any clinical signs or symptoms of infection and DVT/PE and directed to call the office immediately should any occur or go to the ER. -Follow-up as scheduled for possible suture removal or sooner if any problems arise. In the meantime, encouraged to call the office with any questions, concerns, change in symptoms.   Celesta Gentile, DPM

## 2021-07-10 ENCOUNTER — Encounter: Payer: Self-pay | Admitting: Internal Medicine

## 2021-07-11 ENCOUNTER — Other Ambulatory Visit: Payer: Self-pay

## 2021-07-11 MED ORDER — METRONIDAZOLE 250 MG PO TABS: 250.0000 mg | ORAL_TABLET | Freq: Three times a day (TID) | ORAL | 0 refills | Status: DC

## 2021-07-11 MED ORDER — CIPROFLOXACIN HCL 500 MG PO TABS: 500.0000 mg | ORAL_TABLET | Freq: Two times a day (BID) | ORAL | 0 refills | Status: DC

## 2021-07-11 NOTE — Telephone Encounter (Signed)
Pt reports she has been having abdominal pain for the past 5 days, states it feels like the pain when she had diverticulitis and was in the hospital. She is not running a fever. Pt requesting antibiotics. Please advise. ?

## 2021-07-11 NOTE — Telephone Encounter (Signed)
Cipro 500 mg twice daily and metronidazole 250 mg 3 times daily x7 days for diverticulitis ?If pain worsens or fails to improve she should notify us immediately ?

## 2021-07-13 ENCOUNTER — Other Ambulatory Visit: Payer: Self-pay

## 2021-07-13 ENCOUNTER — Ambulatory Visit (INDEPENDENT_AMBULATORY_CARE_PROVIDER_SITE_OTHER): Payer: HMO | Admitting: Podiatry

## 2021-07-13 DIAGNOSIS — M869 Osteomyelitis, unspecified: Secondary | ICD-10-CM

## 2021-07-13 DIAGNOSIS — L97522 Non-pressure chronic ulcer of other part of left foot with fat layer exposed: Secondary | ICD-10-CM

## 2021-07-14 ENCOUNTER — Telehealth: Payer: Self-pay

## 2021-07-14 ENCOUNTER — Encounter: Payer: Self-pay | Admitting: Internal Medicine

## 2021-07-14 NOTE — Telephone Encounter (Signed)
This message has been turned into a telephone note and sent to DOD as Dr. Hilarie Fredrickson is out of the office. ?

## 2021-07-14 NOTE — Telephone Encounter (Signed)
Dr. Carlean Purl, as DOD PM of 07/14/21 please advise.  ? ?Pyrtle pt that was recently prescribed Cipro and Flagyl for possible diverticulitis flare. See previous message streams from 07/10/21. ? ?Patient sent this my chart message today: ?"I am so sorry to bother you again.  The Ciprofloxacin '500mg'$ . I am not going to be able to take because I have been in tears with my stomach. When I started taking it on March 7th, I have episodes of burning and the upper part of my stomach feels like it is twisting so bad, that it hurts to breathe .  I just went off of doxycycline for a toe infection, It hurt me to but nothing like the Cipro.  I have some of that left, If Doctor Prytle thinks it would help.  If not, Would a Z-pack help. I don't have problems with that.  Thank You so much. [name]" ?

## 2021-07-14 NOTE — Telephone Encounter (Signed)
Based upon what she is describing I recommend that she stop the cipro and metronidazole and be seen at ED as she will most likely need another CT scan to see if this is diverticulitis vs something else. ? ?I am sorry but cannot provide help over the phone that I think is appropriate ?

## 2021-07-14 NOTE — Telephone Encounter (Signed)
Attempted to reach pt at her home phone. I left her a detailed message on her home phone with Dr. Celesta Aver recommendations. Advised pt to send a my chart message or give [place] a call to let us know that she received our message. ?

## 2021-07-16 NOTE — Progress Notes (Signed)
Subjective: ?[name] is a 71 y.o. is seen today in office s/p left partial fourth toe amputation preformed on 06/28/2021.  She presents today for possible suture removal.  States that she has been doing well.  She has no pain.  She denies any fevers or chills.  No chest pain or shortness of breath.  She has no other concerns today.  ? ?Objective: ?General: No acute distress, AAOx3  ?DP/PT pulses palpable 2/4, CRT < 3 sec to all digits.  ?Left foot: Incision is well coapted without any evidence of dehiscence with sutures intact.  Dry skin is noted.  I removed the sutures skin incision is well coapted.  There is no drainage or pus.  No significant edema, erythema or signs of infection.  No fluctuation or crepitation.  There is no malodor.  ?Right foot: On the dorsal aspect of the right second toe is 1 small superficial area of skin irritation just proximal nail fold.  There is no edema, erythema or signs of infection. ?No pain with calf compression, swelling, warmth, erythema.  ? ? ? ? ? ? ? ? ?Assessment and Plan:  ?Status post left partial fourth amputation, doing well with no complications  ? ?-Treatment options discussed including all alternatives, risks, and complications ?-I remove the sutures skin incisions well coapted.  Small mount of antibiotic ointment and a bandage was applied.  Discussed with her she can use similar dressings at home.  Still remain in surgical shoe, elevation and limited activity.  We discussed today that if she were to end up having another toe amputated her best option is a transmetatarsal amputation.  Hopefully can avoid this.  Encouraged glucose control, elevation and daily foot inspection. ?-Monitor the right second toe.  Offloading.  She can also apply bandage for protection. ? ?Trula Slade DPM ?

## 2021-07-18 ENCOUNTER — Encounter: Payer: Self-pay | Admitting: Podiatry

## 2021-07-21 ENCOUNTER — Ambulatory Visit: Payer: HMO | Admitting: Podiatry

## 2021-07-21 ENCOUNTER — Encounter: Payer: Self-pay | Admitting: Podiatry

## 2021-07-21 ENCOUNTER — Other Ambulatory Visit: Payer: Self-pay

## 2021-07-21 DIAGNOSIS — N949 Unspecified condition associated with female genital organs and menstrual cycle: Secondary | ICD-10-CM | POA: Insufficient documentation

## 2021-07-21 DIAGNOSIS — E559 Vitamin D deficiency, unspecified: Secondary | ICD-10-CM | POA: Insufficient documentation

## 2021-07-21 DIAGNOSIS — F3340 Major depressive disorder, recurrent, in remission, unspecified: Secondary | ICD-10-CM | POA: Insufficient documentation

## 2021-07-21 DIAGNOSIS — K5730 Diverticulosis of large intestine without perforation or abscess without bleeding: Secondary | ICD-10-CM | POA: Insufficient documentation

## 2021-07-21 DIAGNOSIS — M1990 Unspecified osteoarthritis, unspecified site: Secondary | ICD-10-CM | POA: Insufficient documentation

## 2021-07-21 DIAGNOSIS — J45909 Unspecified asthma, uncomplicated: Secondary | ICD-10-CM | POA: Insufficient documentation

## 2021-07-21 DIAGNOSIS — F331 Major depressive disorder, recurrent, moderate: Secondary | ICD-10-CM | POA: Insufficient documentation

## 2021-07-21 DIAGNOSIS — M869 Osteomyelitis, unspecified: Secondary | ICD-10-CM

## 2021-07-21 DIAGNOSIS — L97521 Non-pressure chronic ulcer of other part of left foot limited to breakdown of skin: Secondary | ICD-10-CM

## 2021-07-21 DIAGNOSIS — N9489 Other specified conditions associated with female genital organs and menstrual cycle: Secondary | ICD-10-CM | POA: Insufficient documentation

## 2021-07-25 NOTE — Progress Notes (Signed)
Subjective: ?ANAIS DENSLOW is a 71 y.o. is seen today in office s/p left partial fourth toe amputation preformed on 06/28/2021.  She presents today she noticed a small wound to the top of her left foot.  She denies any drainage or pus coming from the area patient was at the area checked.  No fevers or chills.  The amputation site is been healing well.  No other concerns.  ? ?Objective: ?General: No acute distress, AAOx3  ?DP/PT pulses palpable 2/4, CRT < 3 sec to all digits.  ?Left foot: Incision is well coapted without any evidence of dehiscence and eschar is forming.  On the dorsal aspect of the foot just proximal to the fourth MPJ area is chief superficial area skin breakdown.  No probing to bone or joint.  No drainage or pus noted.  No fluctuation or crepitation.  There is no malodor.  ?No pain with calf compression, swelling, warmth, erythema.  ? ? ? ? ? ? ? ? ?Assessment and Plan:  ?Status post left partial fourth amputation, doing well with no complications; superficial ulcer dorsal foot ? ?-Treatment options discussed including all alternatives, risks, and complications ?-Incisions and indications healing well.  Continue some of antibiotic ointment to the area.  Also continues on the dorsal foot wound. ?-Continue offloading with surgical shoe ?-Monitor for any clinical signs or symptoms of infection and directed to call the office immediately should any occur or go to the ER. ? ?Return in about 2 weeks (around 08/04/2021). ? ?Trula Slade DPM ?

## 2021-07-27 ENCOUNTER — Encounter: Payer: HMO | Admitting: Podiatry

## 2021-08-03 ENCOUNTER — Ambulatory Visit: Payer: HMO

## 2021-08-03 ENCOUNTER — Ambulatory Visit (INDEPENDENT_AMBULATORY_CARE_PROVIDER_SITE_OTHER): Payer: HMO | Admitting: Podiatry

## 2021-08-03 DIAGNOSIS — Z89422 Acquired absence of other left toe(s): Secondary | ICD-10-CM

## 2021-08-03 DIAGNOSIS — Z9889 Other specified postprocedural states: Secondary | ICD-10-CM

## 2021-08-03 DIAGNOSIS — M2042 Other hammer toe(s) (acquired), left foot: Secondary | ICD-10-CM

## 2021-08-03 DIAGNOSIS — M2041 Other hammer toe(s) (acquired), right foot: Secondary | ICD-10-CM

## 2021-08-03 DIAGNOSIS — E1149 Type 2 diabetes mellitus with other diabetic neurological complication: Secondary | ICD-10-CM

## 2021-08-08 ENCOUNTER — Other Ambulatory Visit: Payer: HMO

## 2021-08-08 DIAGNOSIS — B354 Tinea corporis: Secondary | ICD-10-CM | POA: Diagnosis not present

## 2021-08-08 DIAGNOSIS — E1129 Type 2 diabetes mellitus with other diabetic kidney complication: Secondary | ICD-10-CM | POA: Diagnosis not present

## 2021-08-08 DIAGNOSIS — I700 Atherosclerosis of aorta: Secondary | ICD-10-CM | POA: Diagnosis not present

## 2021-08-08 NOTE — Progress Notes (Signed)
Subjective: ?[name] is a 71 y.o. is seen today in office s/p left partial fourth toe amputation preformed on 06/28/2021.  She states that the amputation site is doing well.  The wound that she had on top of the foot has healed but she is noticing a new crack on the toe on her left side of the big toe.  She denies any drainage or pus.  She states the skin gets dry and cracks open.  She denies any fevers or chills.  She has no other concerns.   ? ?Objective: ?General: No acute distress, AAOx3  ?DP/PT pulses palpable 2/4, CRT < 3 sec to all digits.  ?Left foot: Incision is well coapted without any evidence of dehiscence and scar is formed.  No edema, erythema in this area.  On the dorsal aspect of the foot there are some dry skin present.  Wound is healed.  There is a superficial skin fissure.  There is no probing,, no tunneling.  There is no drainage or pus.  No signs of infection. ?No pain with calf compression, swelling, warmth, erythema.  ? ? ? ? ? ? ? ? ? ? ? ? ? ?Assessment and Plan:  ?Status post left partial fourth amputation, doing well with no complications; skin fissure right foot ? ?-Treatment options discussed including all alternatives, risks, and complications ?-Amputation site is healing well.  No signs of infection.  The wound on the top of the foot is also healed.  She has a new skin fissure on the plantar.  Discussed keeping the area with a small amount of antibiotic ointment and a bandage.  She can use moisturizing feet otherwise to help with any further cracking.  Once this is healed she can use a small mount of moisturizer there as well. ?-To measure for diabetic shoes.  Prescription was written. ?-Monitor for any clinical signs or symptoms of infection and directed to call the office immediately should any occur or go to the ER. ? ?Trula Slade DPM ?

## 2021-08-16 ENCOUNTER — Ambulatory Visit: Payer: HMO

## 2021-08-16 DIAGNOSIS — Z89421 Acquired absence of other right toe(s): Secondary | ICD-10-CM

## 2021-08-16 DIAGNOSIS — Z89422 Acquired absence of other left toe(s): Secondary | ICD-10-CM

## 2021-08-16 DIAGNOSIS — E1149 Type 2 diabetes mellitus with other diabetic neurological complication: Secondary | ICD-10-CM

## 2021-08-16 DIAGNOSIS — M2041 Other hammer toe(s) (acquired), right foot: Secondary | ICD-10-CM

## 2021-08-16 NOTE — Progress Notes (Signed)
SITUATION ?Reason for Consult: Evaluation for Prefabricated Diabetic Shoes and Custom Diabetic Inserts. ?Patient / Caregiver Report: Patient would like well fitting shoes ? ?OBJECTIVE DATA: ?Patient History / Diagnosis:  ?  ICD-10-CM   ?1. Type II diabetes mellitus with neurological manifestations ([place])  E11.49   ?  ?2. Hammertoes of both feet  M20.41   ? M20.42   ?  ?3. Absence of toe of left foot ([place])  Z89.422   ?  ?4. Absence of toe of right foot ([place])  Z89.421   ?  ? ? ?Physician Treating Diabetes:  [place] ? ?Current or Previous Devices:   Historical ? ?In-Person Foot Examination: ?Ulcers & Callousing:   Historical ?Deformities:    Amputation of toes, left and right ?Sensation:    Compromised  ?Shoe Size:     72M ? ?ORTHOTIC RECOMMENDATION ?Recommended Devices: ?- 1x pair prefabricated PDAC approved diabetic shoes; Patient Selected [employer] Black 849 Size 72M ?- 3x pair custom-to-patient PDAC approved vacuum formed diabetic insoles. ? ?GOALS OF SHOES AND INSOLES ?- Reduce shear and pressure ?- Reduce / Prevent callus formation ?- Reduce / Prevent ulceration ?- Protect the fragile healing compromised diabetic foot. ? ?Patient would benefit from diabetic shoes and inserts as patient has diabetes mellitus and the patient has one or more of the following conditions: ?- History of partial or complete amputation of the foot ?- History of previous foot ulceration. ?- History of pre-ulcerative callus ?- Peripheral neuropathy with evidence of callus formation ?- Foot deformity ?- Poor circulation ? ?ACTIONS PERFORMED ?Potential out of pocket cost was communicated to patient. Patient understood and consented to measurement and casting. Patient was casted for insoles via crush box and measured for shoes via brannock device. Procedure was explained and patient tolerated procedure well. All questions were answered and concerns addressed. Casts were shipped to central fabrication for HOLD until Certificate of Medical  Necessity or otherwise necessary authorization from insurance is obtained. ? ?PLAN ?Shoes are to be ordered and casts released from hold once all appropriate paperwork is complete. Patient is to be contacted and scheduled for fitting once shoes and insoles have been fabricated and received. ? ?

## 2021-08-30 ENCOUNTER — Encounter: Payer: Self-pay | Admitting: Internal Medicine

## 2021-08-30 ENCOUNTER — Ambulatory Visit: Payer: HMO | Admitting: Internal Medicine

## 2021-08-30 VITALS — BP 130/62 | HR 69 | Ht 63.0 in | Wt 237.2 lb

## 2021-08-30 DIAGNOSIS — E785 Hyperlipidemia, unspecified: Secondary | ICD-10-CM | POA: Diagnosis not present

## 2021-08-30 DIAGNOSIS — E669 Obesity, unspecified: Secondary | ICD-10-CM

## 2021-08-30 DIAGNOSIS — E1159 Type 2 diabetes mellitus with other circulatory complications: Secondary | ICD-10-CM

## 2021-08-30 DIAGNOSIS — E1165 Type 2 diabetes mellitus with hyperglycemia: Secondary | ICD-10-CM | POA: Diagnosis not present

## 2021-08-30 LAB — POCT GLYCOSYLATED HEMOGLOBIN (HGB A1C): Hemoglobin A1C: 7.6 % — AB (ref 4.0–5.6)

## 2021-08-30 MED ORDER — TRESIBA FLEXTOUCH 200 UNIT/ML ~~LOC~~ SOPN: PEN_INJECTOR | SUBCUTANEOUS | 3 refills | Status: DC

## 2021-08-30 NOTE — Progress Notes (Signed)
Patient ID: [name], female   DOB: 1950-06-02, 71 y.o.   MRN: [id number]  ? ?This visit occurred during the SARS-CoV-2 public health emergency.  Safety protocols were in place, including screening questions prior to the visit, additional usage of staff PPE, and extensive cleaning of exam room while observing appropriate contact time as indicated for disinfecting solutions.  ? ?HPI: ?[name] is a 71 y.o.-year-old female, initially referred by her PCP, Dr. Harley Alto for f/u for DM2, dx in 1980s, insulin-dependent, controlled, with complications (PN - s/p B toe amputations 2/2 osteomyelitis, PAD, foot ulcer). She saw Dr. Elyse Hsu previously. Last visit with me 3 months ago. ? ?Interim history: ?She has osteomyelitis of the right second toe and she is status post left partial fourth toe amputation on 06/28/2021. ?No steroid inj's since last OV ?No increased urination, blurry vision, nausea, chest pain. ?She had diverticulitis again in 06/2021 - Doxycycline + Flagyl. ?She also had abdominal pain, which is suspected was from NovoLog, but it turned out that it was related to turmeric supplements.  She stopped these.  Abdominal pain improved. ? ?Reviewed her HbA1c levels: ?Lab Results  ?Component Value Date  ? HGBA1C 8.5 (A) 05/31/2021  ? HGBA1C 8.5 (A) 02/21/2021  ? HGBA1C 7.8 (H) 12/14/2020  ?09/30/2020: HbA1c calculated from fructosamine is much better, at 6.6%, correlating more with her blood sugars at home. ?02/16/2016: 7.1% ?06/2015: 6.6% ? ?Pt is on a regimen of: ?- Jardiance 12.5 >> 10 mg daily before b'fast - but needed a PA to continue treatment >> 25 mg daily in am ?- Glipizide 2.5 mg before b'fast - added back 09/2018 >> 5 mg 2x a day before meals ?- Toujeo >> [place] 20 >> ... 52 units daily  (felt poorly at 54 units daily) ?- Novolog 7 to 10 >> 12 >>  >> 10-12 >> 12-16 (20) units before each meal  ?She stopped Ozempic 04/2019 due to GI symptoms.  We retried it in 09/2019 with same symptoms  so he had to stop. ?She stopped Metformin ER >> AP and diarrhea ?She was on Victoza >> rash. ?In 09/2016, she had a steroid inj in back >> sugars 200s >> used Novolog for 3 days.  ?She initially refused insulin. ? ?Pt checks her sugars once a day: ?- am: 00-150 (no steroids), 159-279 (on steroids) >> 93, 123-215 >> 91-134 ?- 2h after b'fast: 129-191, 231 >> 117  >> 120-144>> n/c >> 101-175 >> 96-141 ?- before lunch: 130-150, 200 >> 125-140, 157 >> n/c>> 253, 287 >> 70, 115-140, 276 ?- 2h after lunch: 135-150 >> 75, 121-145, 155 >> 120-152 >> 140-200 >> 132-145 ?- before dinner:  150-269 >> 135 >> 140-190 >> n/c >> 162, 170  >> 130-137, 186 ?- 2h after dinner:  94-132 (no steroids), 220-420 (on steroids) >> 115-275 >> n/c ?- bedtime:  272 >> 180 >> n/c >> 259 >> n/c >> 145 >> n/c>> 130-205 >> N/c ?- nighttime: n/c ?Lowest sugar was 98 >> 75 >> 94 >> 70; she has hypoglycemia awareness in the 90s. ?Highest sugar was 436 ...>> 260 (steroid inj) >> 420 (steroids) >> 276 ? ?Glucometer: One Touch Verio - at last visit I advised her to get the new meter ? ?Pt's meals are: ?- Breakfast: [place] bacon, 2 eggs, toast; oatmeal; or brown rice ?- Lunch: Tuna, crackers or sandwich, chicken strips, chips, diet Pepsi ?- Dinner: Meat, veggies, starch  ?- Snacks: 2-3: 15g carbs (pretzels, chips) ? ?-+ CKD, last BUN/creatinine:  ?  Lab Results  ?Component Value Date  ? BUN 55 (H) 06/13/2021  ? BUN 40 (H) 12/23/2020  ? CREATININE 1.55 (H) 06/13/2021  ? CREATININE 1.39 (H) 12/23/2020  ?11/21/2020: CMP normal, except glucose 106, BUN/creatinine 49/1.36, GFR 43 ?05/25/2020: CMP normal except: Glu 129, BUNs/creatinine 44/1.29, GFR 41, otherwise normal ?12/31/2019: 42/1.37, GFR 38, glucose 193, otherwise normal CMP ?03/21/2018: CMP normal with the exception of high glucose at 143, BUN/creatinine 37/1.26, GFR 43 (kidney function improved from 09/2017) ?Urinalysis with 1+ protein ?09/13/2017: Glucose 123, BUN 44/creatinine 1.34, GFR 40, previously  44 ?03/06/2017: ACR 222.8 ?08/29/2016: ACR 76.7 ?02/16/2016: 42/1.34 (GFR 40), Glu 213  ?12/26/2015: 25/1.23 (GFR 44), Glu 153 ?Off lisinopril. ? ?-+ HL; last set of lipids: ?11/21/2020: 117/204/35/49 ?05/25/2020: 106/204/33/40 ?12/31/2019: 102/147/33/43 ?Lab Results  ?Component Value Date  ? CHOL 110 02/04/2019  ? HDL 31.60 (L) 02/04/2019  ? LDLDIRECT 44.0 02/04/2019  ? TRIG 255.0 (H) 02/04/2019  ? CHOLHDL 3 02/04/2019  ?03/21/2018: 92/202/31/21 ?09/04/2017: 126/269/33/39 ?08/29/2016: 87/132/32/30 ?02/16/2016: 128/278/33/39  ?03/02/2014: 114/175/32/48 ?On Zocor 40. ? ?- last eye exam was 05/09/2021: No DR (Dr. Gershon Crane).  She has a history of cataract surgery. ? ?-She has numbness and tingling in her feet.  She sees podiatry -Dr. Jacqualyn Posey.  Latest foot exam was performed on 06/13/2021. ? ?She also has a history of HTN, GERD, anemia. ?She had amputation of 1/2 of the left hallux 05/06/2017.  ?She started Humira (for psoriasis) but did not tolerate this >> stopped. ?She started PT for her back pain. Also dry needling.  ? ?ROS: ?+ See HPI ?Musculoskeletal: + muscle aches/+ joint aches ?Neurological: no tremors/+ numbness/+ tingling/no dizziness ? ?I reviewed pt's medications, allergies, PMH, social hx, family hx, and changes were documented in the history of present illness. Otherwise, unchanged from my initial visit note. ? ?Past Medical History:  ?Diagnosis Date  ? Allergy   ? seasonal  ? Anemia in chronic renal disease 06/05/2011  ? Anxiety   ? takes Ativan tid  ? Asthma   ? has an inhaler prn  ? Chronic kidney disease   ? Cough   ? DDD (degenerative disc disease), cervical 06/05/2011  ? DDD (degenerative disc disease), lumbosacral 06/05/2011  ? Depression   ? takes Cymbalta daily  ? Diabetes mellitus   ? takes Amaryl and Metformin daily  ? Diabetic neuropathy ([place])   ? Diverticulosis   ? Dizziness   ? GERD (gastroesophageal reflux disease)   ? takes Protonix prn  ? Headache(784.0)   ? occasionally  ? History of bronchitis    ? last time 86yr ago  ? History of colon polyps   ? History of MRSA infection   ? HTN (hypertension) 06/05/2011  ? takes Prinizide daily  ? Hyperlipidemia   ? takes Zocor daily  ? Insomnia   ? not on any meds at present time  ? Osteoarthritis   ? PONV (postoperative nausea and vomiting)   ? hard to wake up  ? Psoriasis 06/05/2011  ? on legs and uses cream prn  ? Seasonal allergies   ? but doesn't take any meds  ? Sleep apnea   ? uses CPAP  ? Tubular adenoma of colon   ? ?Past Surgical History:  ?Procedure Laterality Date  ? ABDOMINAL HYSTERECTOMY  1982 and 1984  ? AMPUTATION  02/12/2012  ? Procedure: AMPUTATION RAY;  Surgeon: JWylene Simmer MD;  Location: [facility]  Service: Orthopedics;  Laterality: Right;  RIGHT 2ND TOE AMPUTATION   ? ANTERIOR LAT  LUMBAR FUSION N/A 12/22/2020  ? Procedure: Thoracic twelve to Lumbar one posterior lateral lumbar interbody fusion with pedicle screw fixation Thoracic ten to Lumbar two with robot, posterior arthrodesis with allograft from Thoracic ten to Lumbar two;  Surgeon: Kristeen Miss, MD;  Location: [facility];  Service: Neurosurgery;  Laterality: N/A;  ? APPLICATION OF ROBOTIC ASSISTANCE FOR SPINAL PROCEDURE N/A 12/22/2020  ? Procedure: APPLICATION OF ROBOTIC ASSISTANCE FOR SPINAL PROCEDURE;  Surgeon: Kristeen Miss, MD;  Location: [facility];  Service: Neurosurgery;  Laterality: N/A;  3C/RM 19  ? BACK SURGERY    ? x 3  ? CARDIAC CATHETERIZATION    ? 08/09/04: 50% mid AV groove CX lesion, NL LM, LAD, Ramus, EF > 60% (Dr. Gwenlyn Found)  ? CARPAL TUNNEL RELEASE Bilateral 2010  ? CERVICAL SPINE SURGERY    ? CHOLECYSTECTOMY  1977  ? COLONOSCOPY  05/30/2009, 09/30/13  ? [place] 2011/Dr.Brodie 2015  ? COLONOSCOPY W/ POLYPECTOMY  06/23/2019  ? DILATION AND CURETTAGE OF UTERUS    ? ELBOW SURGERY    ? left d/t tendonitis  ? ESOPHAGOGASTRODUODENOSCOPY    ? LUMBAR WOUND DEBRIDEMENT N/A 07/15/2012  ? Procedure: I & D of Lumbar Wound: Possible Repair of CSF Leak;  Surgeon: Elaina Hoops, MD;  Location: [facility];   Service: Neurosurgery;  Laterality: N/A;  I & D of Lumbar Wound: Possible Repair of CSF Leak placement lumbar drain  ? POLYPECTOMY    ? right knee arthroscopy    ? x 2  ? SHOULDER ARTHROSCOPY Left   ? TOE

## 2021-08-30 NOTE — Patient Instructions (Signed)
Please continue: - Jardiance 25 mg daily before b'fast - Glipizide 5 mg 2x a day before meals - Tresiba 52 units at bedtime  - Novolog 12-16 units 15 min before a meal 3x a day (up to 20 units with steroids)  Please come back for a follow-up appointment in 4 months. 

## 2021-09-05 ENCOUNTER — Telehealth: Payer: Self-pay

## 2021-09-05 ENCOUNTER — Ambulatory Visit (INDEPENDENT_AMBULATORY_CARE_PROVIDER_SITE_OTHER): Payer: HMO | Admitting: Podiatry

## 2021-09-05 DIAGNOSIS — B351 Tinea unguium: Secondary | ICD-10-CM

## 2021-09-05 DIAGNOSIS — Z89422 Acquired absence of other left toe(s): Secondary | ICD-10-CM

## 2021-09-05 DIAGNOSIS — L853 Xerosis cutis: Secondary | ICD-10-CM

## 2021-09-05 NOTE — Telephone Encounter (Signed)
Shoes Ordered - [employer] black 849 32M ?

## 2021-09-07 NOTE — Progress Notes (Signed)
Subjective: ?[name] is a 71 y.o. is seen today in office s/p left partial fourth toe amputation preformed on 06/28/2021 as well as for her superficial wounds on her left foot.  She states she has been doing well and the wounds of healed.  There is no drainage or pus or any swelling or redness.  She has no fevers or chills.  She has no other concerns today.  ? ? ?Objective: ?General: No acute distress, AAOx3  ?DP/PT pulses palpable 2/4, CRT < 3 sec to all digits.  ?Left foot: Incision is well coapted without any evidence of dehiscence and scar is formed.  Dry skin is present.  The area of the superficial skin fissure has healed at this time.  There is no ulcerations or openings noted today.  There is no drainage or pus.  No edema. ?Nails are mildly elongated and there is discoloration of the nails with subungual debris present. ?Hammertoes present  ?No pain with calf compression, swelling, warmth, erythema.  ? ?Assessment and Plan:  ?Status post left partial fourth amputation, doing well with no complications; skin fissure right foot ? ?-Treatment options discussed including all alternatives, risks, and complications ?-Potation site still well-healed.  The area of the wound is healed as well.  There is dry skin present.  Recommend moisturizer daily.  Discussed the importance of daily foot inspection.  As a courtesy debride the nails with any complications or bleeding. ?-Daily foot inspection, glucose control discussed. ? ?Return in about 9 weeks (around 11/07/2021). ? ?Trula Slade DPM ?

## 2021-09-18 IMAGING — MR MR LUMBAR SPINE WO/W CM
4 of 7 series · 23 of 48 positions shown · IV contrast (multihance)
Comparison: MRI lumbar spine 08/16/2016 at [REDACTED].

CLINICAL DATA: Low back pain extending into the lower extremities
bilaterally over the last 6 months. Symptoms are increasing.

EXAM:
MRI LUMBAR SPINE WITHOUT AND WITH CONTRAST
TECHNIQUE: Multiplanar and multiecho pulse sequences of the lumbar spine were
obtained without and with intravenous contrast.
CONTRAST:  20mL MULTIHANCE GADOBENATE DIMEGLUMINE 529 MG/ML IV SOLN

[Series 2: T1 · sagittal · 4.0mm · 0.88mm/px · 3 of 14 slices shown (1 of 2)]
[im 1/14]
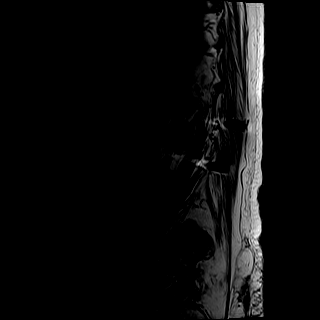
[im 7/14]
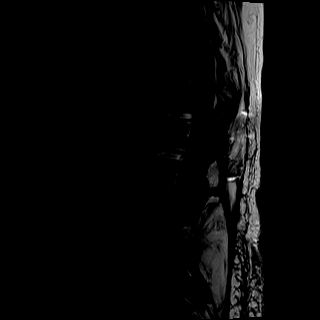
[im 14/14]
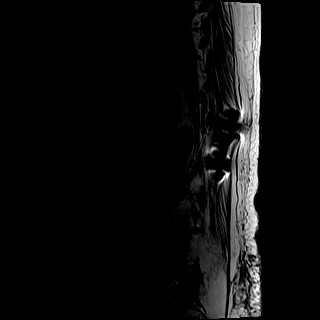

[Series 4: T2 · axial · 4.0mm · 0.39mm/px · z∈[-61,+143]mm · 11 of 40 slices shown (1 of 2)]
[im 1/40]
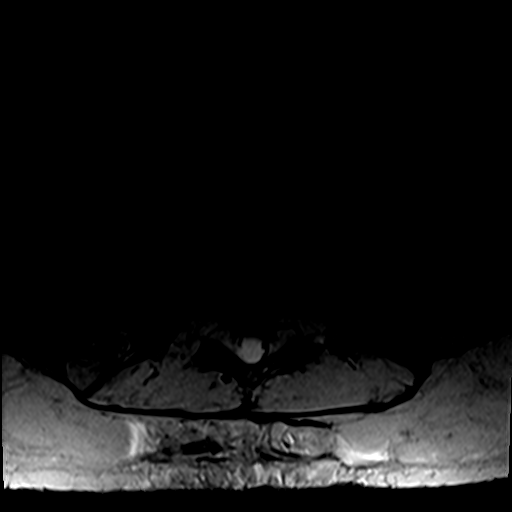
[im 4/40]
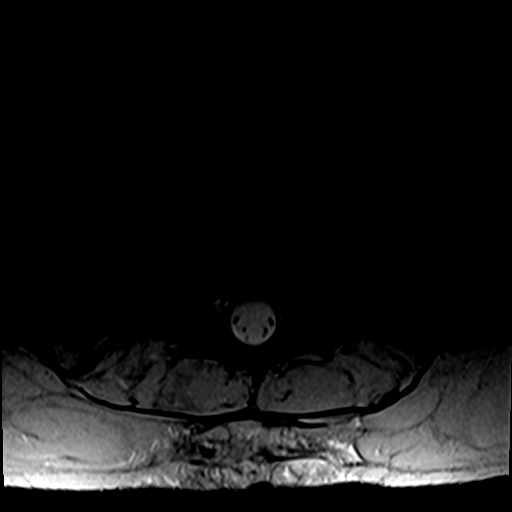
[im 8/40]
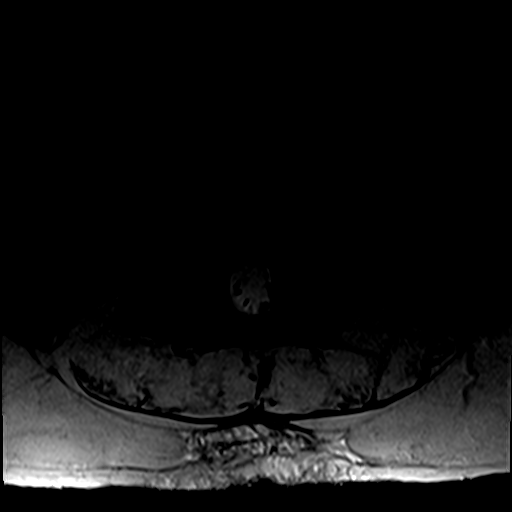
[im 12/40]
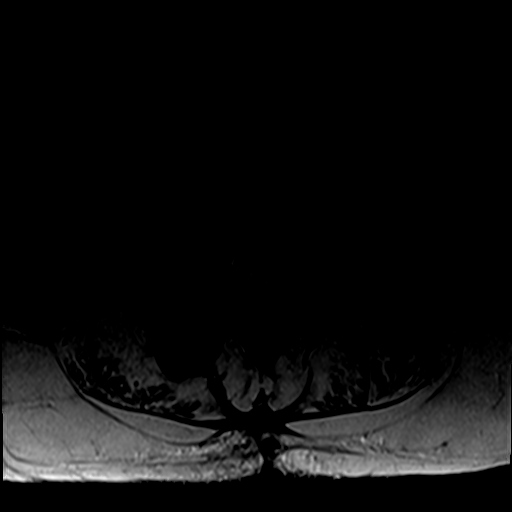
[im 16/40]
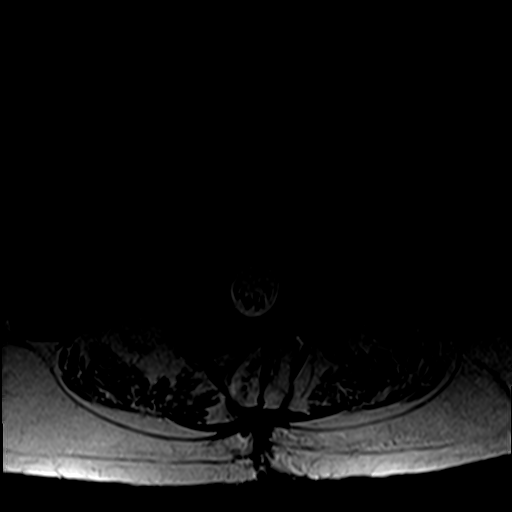
[im 20/40]
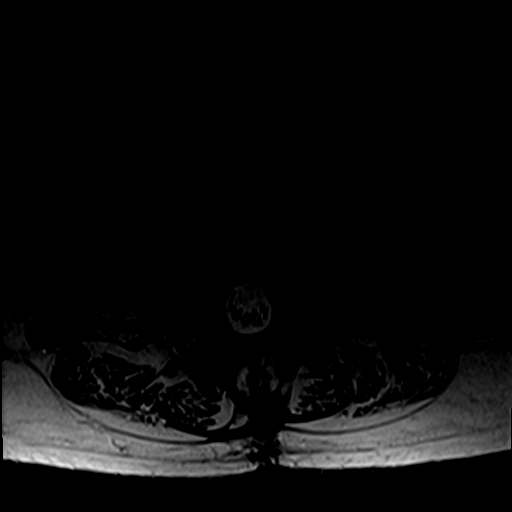
[im 24/40]
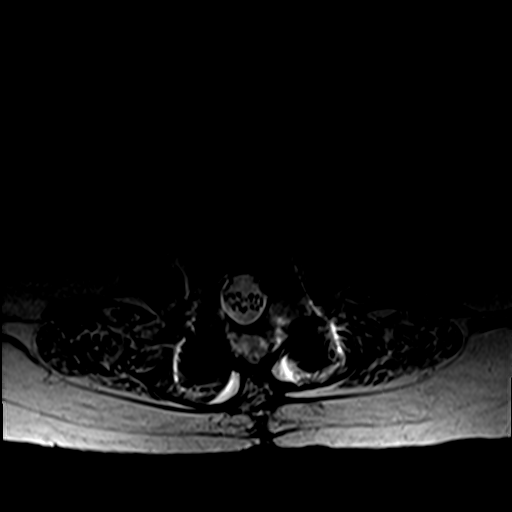
[im 28/40]
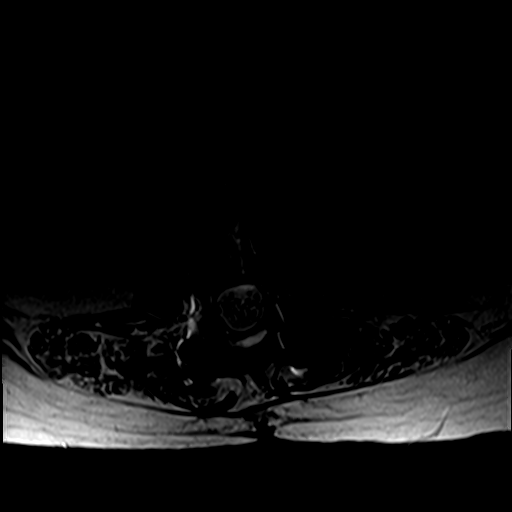
[im 32/40]
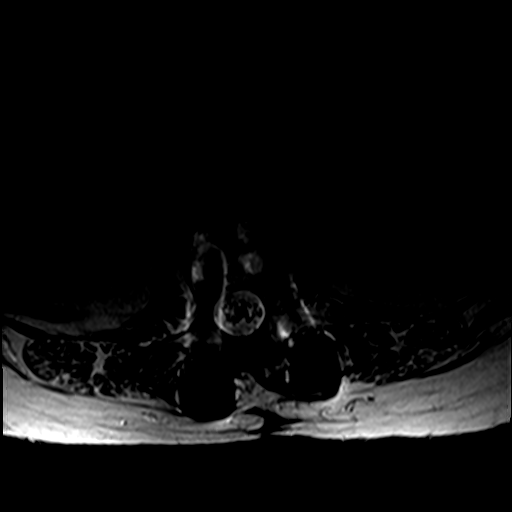
[im 36/40]
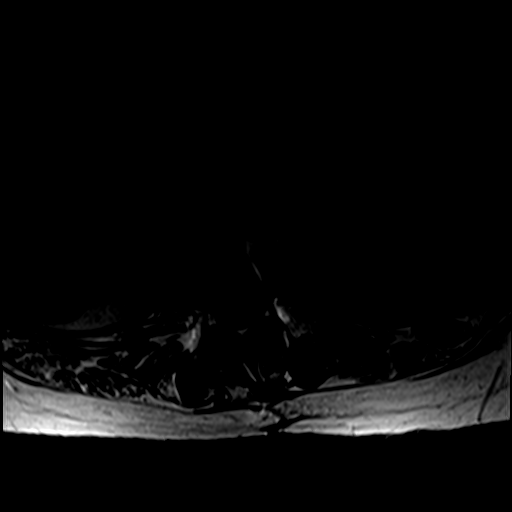
[im 40/40]
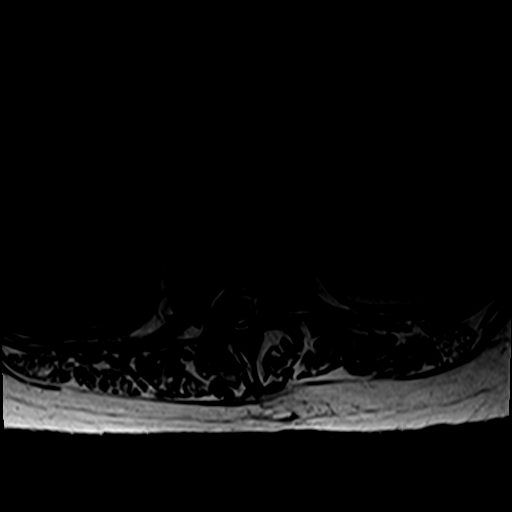

[Series 5: T1 · axial · 4.0mm · 0.39mm/px · z∈[-61,+124]mm · 5 of 40 slices shown (2 of 2)]
[im 1/40]
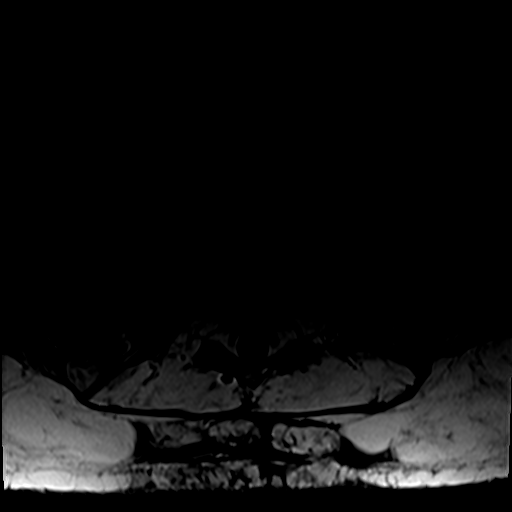
[im 4/40]
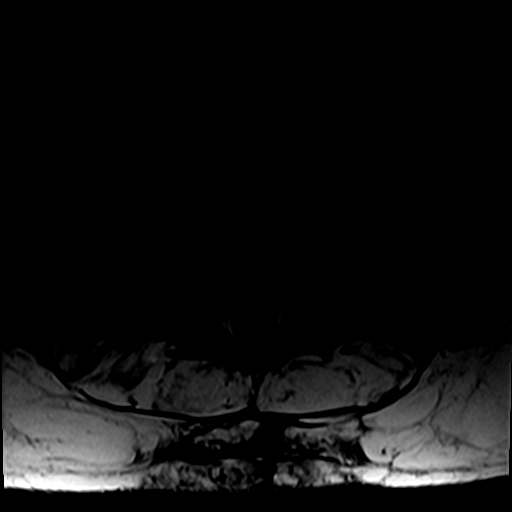
[im 8/40]
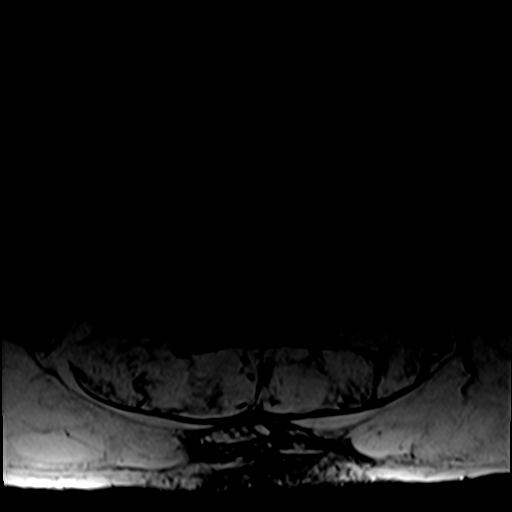
[im 20/40]
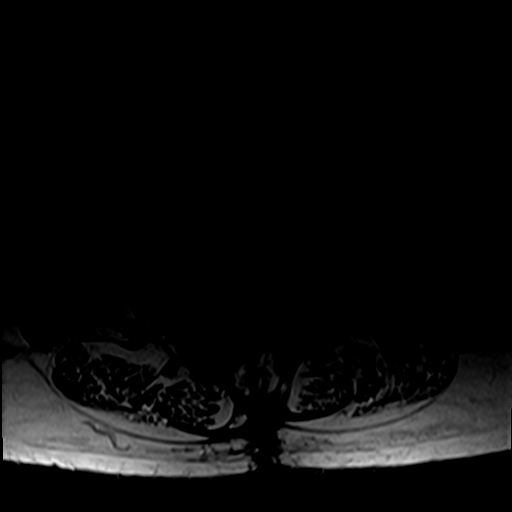
[im 36/40]
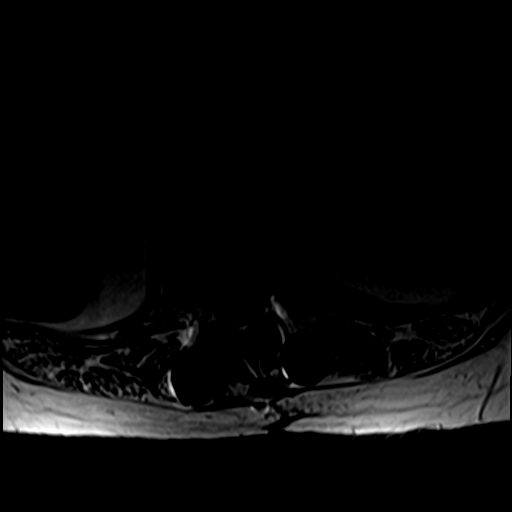

[Series 6: T2 · sagittal · 4.0mm · 1.09mm/px · 4 of 14 slices shown (2 of 2)]
[im 1/14]
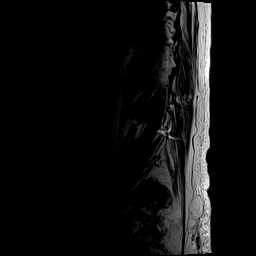
[im 5/14]
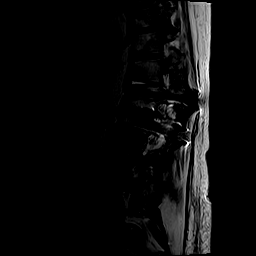
[im 9/14]
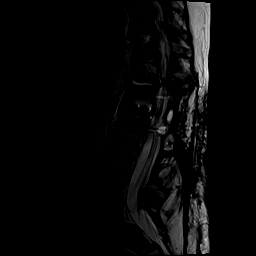
[im 14/14]
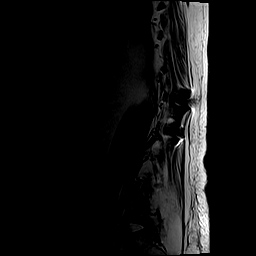

[23 of 48 positions shown; findings below may reference images not displayed]

FINDINGS: Segmentation: 5 non rib-bearing lumbar type vertebral bodies are
present. The lowest fully formed vertebral body is L5.

Alignment: 9 mm anterolisthesis at L5-S1 is stable. No other
significant listhesis is present. There is some straightening of the
normal cervical lordosis.

Vertebrae: Edematous endplate marrow changes at T10-11 were not
previously imaged. There is associated enhancement. No enhancement
is present in the disc space.

Fluid is present in the disc space at T12-L1 with subtle endplate
enhancement extending anterior to the disc space. No other
significant marrow enhancement is present. Chronic fatty
infiltration is present posteriorly at L5-S1.

Conus medullaris and cauda equina: Conus extends to the L1 level.
Conus and cauda equina appear normal.

Paraspinal and other soft tissues: Limited imaging the abdomen is
unremarkable. There is no significant adenopathy. No solid organ
lesions are present.

Disc levels:

T12-L1: Abnormal fluid signal and enhancement is concerning for
early disc osteomyelitis.

L1-2: Solid fusion.  No residual or recurrent stenosis.

L2-3: Solid fusion. No residual or recurrent stenosis. Bilateral
foraminotomies.

L3-4: Solid fusion laminectomy. No residual or recurrent stenosis is
present.

L4-5: Moderate facet hypertrophy is present bilaterally. No
significant stenosis is present.

L5-S1: Laminectomy is noted.  No significant stenosis is present.
IMPRESSION: 1. Abnormal fluid signal and enhancement at the T12-L1 disc space
concerning for early disc osteomyelitis.
2. Stable 9 mm anterolisthesis at L5-S1 without significant
stenosis.
3. Solid fusion at L1-2, L2-3, and L3-4 without significant residual
or recurrent stenosis.
4. Moderate facet hypertrophy at L4-5 without significant stenosis.

These results will be called to the ordering clinician or
representative by the Radiologist Assistant, and communication
documented in the PACS or [REDACTED].

## 2021-09-20 DIAGNOSIS — L409 Psoriasis, unspecified: Secondary | ICD-10-CM | POA: Diagnosis not present

## 2021-10-03 ENCOUNTER — Telehealth: Payer: Self-pay

## 2021-10-03 NOTE — Telephone Encounter (Signed)
Shoes and insoles ready - LVM for patient to schedule pickup

## 2021-10-10 ENCOUNTER — Ambulatory Visit (INDEPENDENT_AMBULATORY_CARE_PROVIDER_SITE_OTHER): Payer: HMO

## 2021-10-10 DIAGNOSIS — Z89422 Acquired absence of other left toe(s): Secondary | ICD-10-CM | POA: Diagnosis not present

## 2021-10-10 DIAGNOSIS — M2042 Other hammer toe(s) (acquired), left foot: Secondary | ICD-10-CM

## 2021-10-10 DIAGNOSIS — E1149 Type 2 diabetes mellitus with other diabetic neurological complication: Secondary | ICD-10-CM | POA: Diagnosis not present

## 2021-10-10 DIAGNOSIS — L853 Xerosis cutis: Secondary | ICD-10-CM | POA: Diagnosis not present

## 2021-10-10 DIAGNOSIS — M2041 Other hammer toe(s) (acquired), right foot: Secondary | ICD-10-CM

## 2021-10-10 NOTE — Progress Notes (Signed)
SITUATION Reason for Visit: Fitting of Diabetic Shoes & Insoles Patient / Caregiver Report:  Patient is satisfied with fit and function of shoes and insoles.  OBJECTIVE DATA: Patient History / Diagnosis:     ICD-10-CM   1. Type II diabetes mellitus with neurological manifestations ([facility])  E11.49     2. Absence of toe of left foot ([facility])  Z89.422       Change in Status:   None  ACTIONS PERFORMED: In-Person Delivery, patient was fit with: - 1x pair [id number] PDAC approved prefabricated Diabetic Shoes: Orthofeet 30 32M - 3x pair [id number] PDAC approved vacuum formed custom diabetic insoles; [employer]: [id number]  Shoes and insoles were verified for structural integrity and safety. Patient wore shoes and insoles in office. Skin was inspected and free of areas of concern after wearing shoes and inserts. Shoes and inserts fit properly. Patient / Caregiver provided with ferbal instruction and demonstration regarding donning, doffing, wear, care, proper fit, function, purpose, cleaning, and use of shoes and insoles ' and in all related precautions and risks and benefits regarding shoes and insoles. Patient / Caregiver was instructed to wear properly fitting socks with shoes at all times. Patient was also provided with verbal instruction regarding how to report any failures or malfunctions of shoes or inserts, and necessary follow up care. Patient / Caregiver was also instructed to contact physician regarding change in status that may affect function of shoes and inserts.   Patient / Caregiver verbalized undersatnding of instruction provided. Patient / Caregiver demonstrated independence with proper donning and doffing of shoes and inserts.  PLAN Patient to follow with treating physician as recommended. Plan of care was discussed with and agreed upon by patient and/or caregiver. All questions were answered and concerns addressed.

## 2021-10-25 DIAGNOSIS — L821 Other seborrheic keratosis: Secondary | ICD-10-CM | POA: Diagnosis not present

## 2021-10-25 DIAGNOSIS — Z79899 Other long term (current) drug therapy: Secondary | ICD-10-CM | POA: Diagnosis not present

## 2021-10-25 DIAGNOSIS — L400 Psoriasis vulgaris: Secondary | ICD-10-CM | POA: Diagnosis not present

## 2021-11-13 ENCOUNTER — Ambulatory Visit: Payer: HMO | Admitting: Podiatry

## 2021-11-13 DIAGNOSIS — M79675 Pain in left toe(s): Secondary | ICD-10-CM | POA: Diagnosis not present

## 2021-11-13 DIAGNOSIS — M79674 Pain in right toe(s): Secondary | ICD-10-CM

## 2021-11-13 DIAGNOSIS — E1149 Type 2 diabetes mellitus with other diabetic neurological complication: Secondary | ICD-10-CM

## 2021-11-13 DIAGNOSIS — B351 Tinea unguium: Secondary | ICD-10-CM | POA: Diagnosis not present

## 2021-11-16 NOTE — Progress Notes (Signed)
Subjective: [name] is a 71 y.o. is seen today in office for follow-up evaluation undergoing left partial prothrombin Tatian ulcerations.  She states that she been doing well.  No new ulcerations everything is healed.  No swelling redness or any drainage.  The remaining nails are thickened elongated causing irritation site shoes.   Objective: General: No acute distress, AAOx3  DP/PT pulses palpable 2/4, CRT < 3 sec to all digits.  Left foot: Incision is well-healed.  There is no open lesions. Nails are hypertrophic, dystrophic, elongated x 6.  Nails affected are 1, 3, 4, 5 on the right and 3, 5 on the right.  No edema, erythema or signs of infection of the nail sites.  Remote toe amputations the other toes. Hammertoes present. No pain with calf compression, swelling, warmth, erythema.   Assessment and Plan:  Status post left partial fourth amputation, doing well with no complications; skin fissure right foot  -Treatment options discussed including all alternatives, risks, and complications -No open lesions this time.  He is to closely monitor for any reoccurrence.  Continue offloading. -Nails sharply debrided x6 without any complications or bleeding. -Daily foot inspection, glucose control discussed.  Trula Slade DPM

## 2021-11-18 IMAGING — RF DG THORACOLUMBAR SPINE 2V
1 series · 3 of 3 positions shown · non-contrast
Comparison: CT of the thoracic spine 12/01/2020.

CLINICAL DATA: Surgery, elective MIY.G ([id number]). Additional
history provided: T12-L1 TLIF. T10-L2 pedicle screws. Provided
fluoroscopy time 41 seconds (35.29 mGy).

EXAM:
THORACOLUMBAR SPINE 1V

[Series 1: run · 3 of 3 slices shown]
[im 1/3]
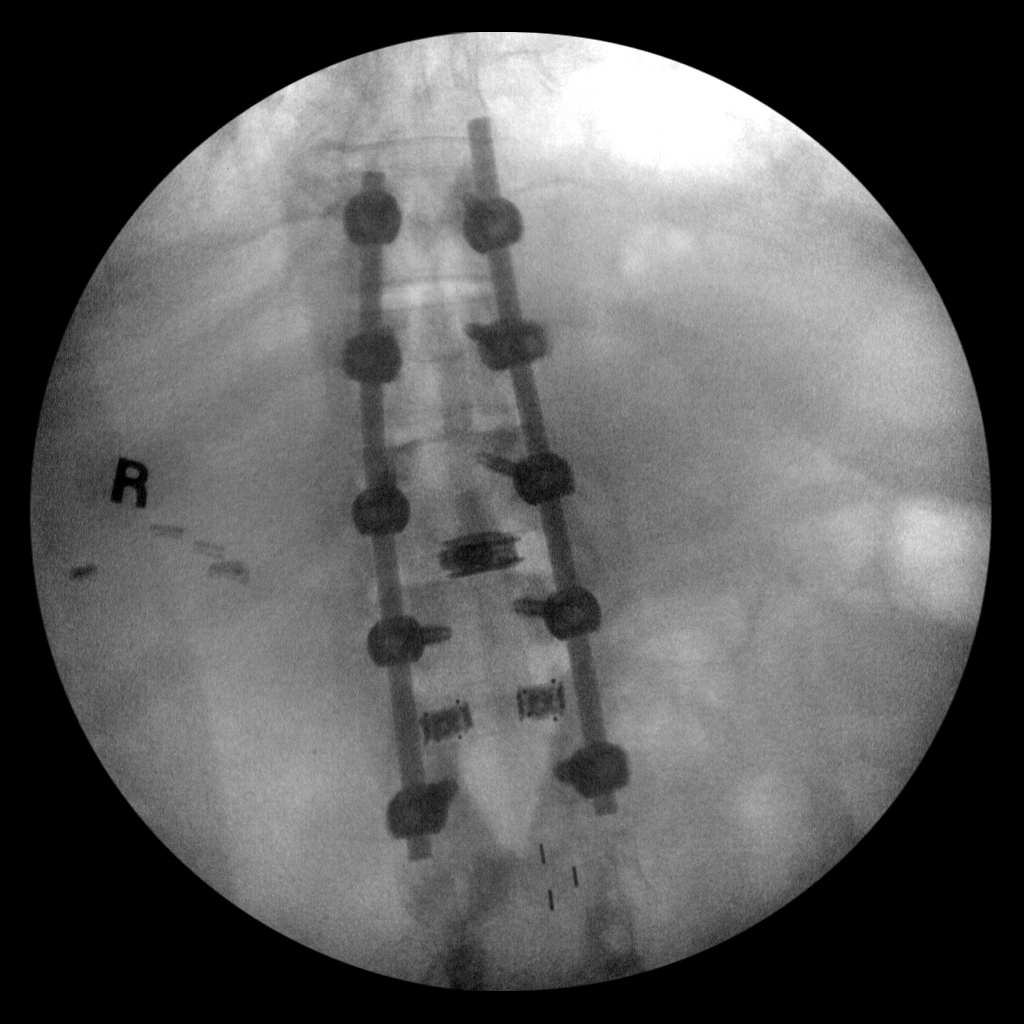
[im 2/3]
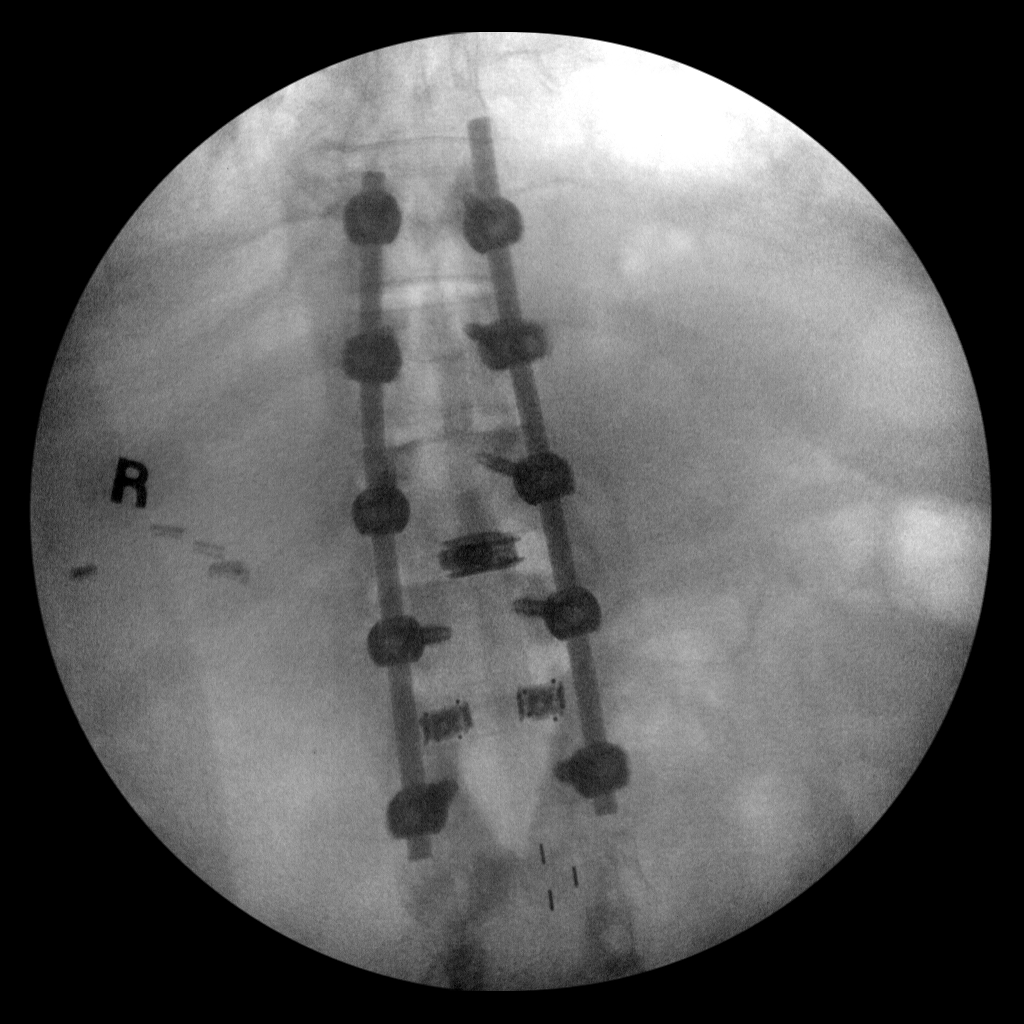
[im 3/3]
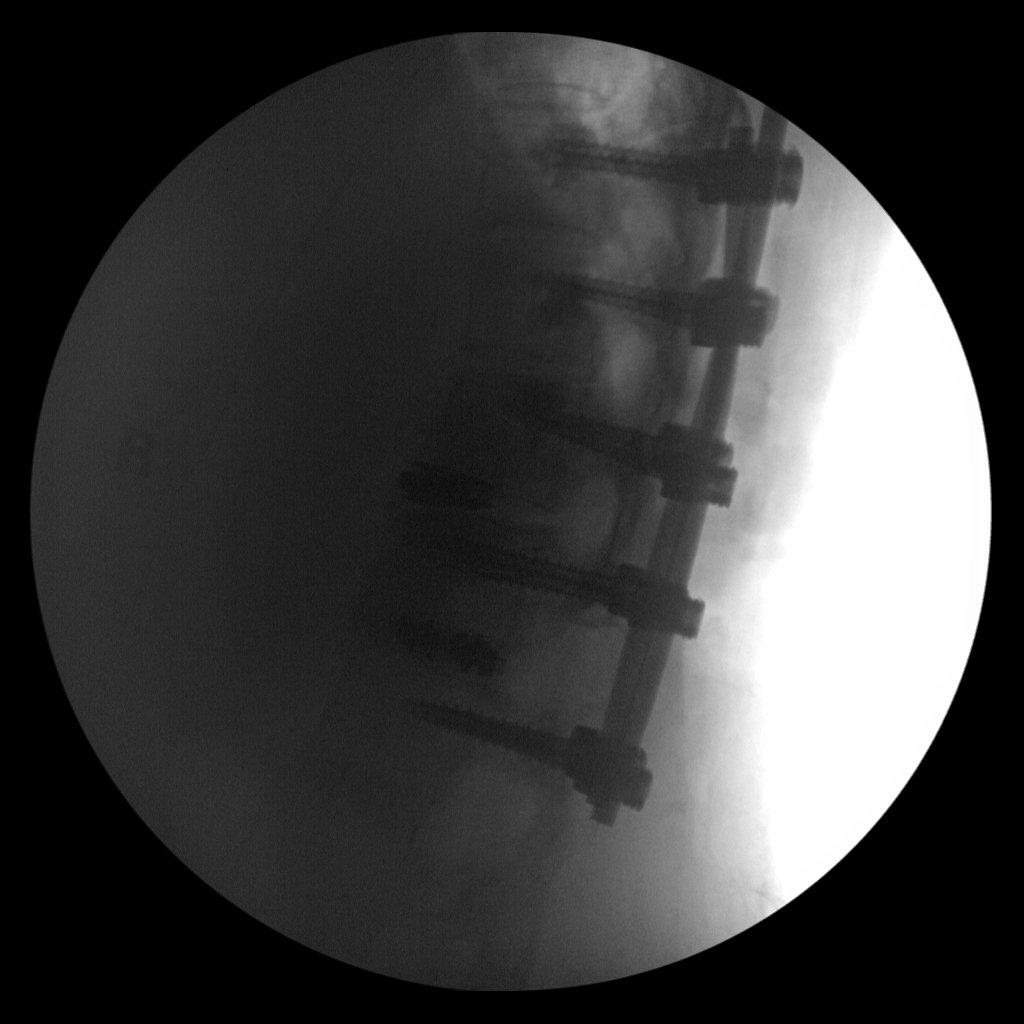

[3 of 3 positions shown; findings below may reference images not displayed]

FINDINGS: AP and lateral view intraoperative fluoroscopic images of the
thoracic and lumbar spine are submitted, 2 images total. On the
provided images, a posterior spinal fusion construct spans the
T10-L2 levels (bilateral pedicle screws and vertical interconnecting
rods). An interbody device is now present at T12-L1. This interbody
device is anterior in position and extends slightly beyond the
ventral margins of the T12 and L1 vertebral bodies. Redemonstrated
interbody devices at L1-L2 and L2-L3.
IMPRESSION: Two intraoperative fluoroscopic images of the thoracic and lumbar
spine demonstrating a posterior spinal fusion construct spanning the
T10-L2 levels. And interbody device is now present at the T12-L1
level. This interbody device is somewhat anterior in position,
extending slightly beyond the ventral margins of the T12 and L1
vertebral bodies.

## 2021-11-22 DIAGNOSIS — M1711 Unilateral primary osteoarthritis, right knee: Secondary | ICD-10-CM | POA: Diagnosis not present

## 2021-11-27 DIAGNOSIS — E1129 Type 2 diabetes mellitus with other diabetic kidney complication: Secondary | ICD-10-CM | POA: Diagnosis not present

## 2021-11-27 DIAGNOSIS — Z89422 Acquired absence of other left toe(s): Secondary | ICD-10-CM | POA: Diagnosis not present

## 2021-11-27 DIAGNOSIS — M1990 Unspecified osteoarthritis, unspecified site: Secondary | ICD-10-CM | POA: Diagnosis not present

## 2021-11-27 DIAGNOSIS — F330 Major depressive disorder, recurrent, mild: Secondary | ICD-10-CM | POA: Diagnosis not present

## 2021-11-27 DIAGNOSIS — N183 Chronic kidney disease, stage 3 unspecified: Secondary | ICD-10-CM | POA: Diagnosis not present

## 2021-11-27 DIAGNOSIS — I129 Hypertensive chronic kidney disease with stage 1 through stage 4 chronic kidney disease, or unspecified chronic kidney disease: Secondary | ICD-10-CM | POA: Diagnosis not present

## 2021-11-27 DIAGNOSIS — E782 Mixed hyperlipidemia: Secondary | ICD-10-CM | POA: Diagnosis not present

## 2021-12-11 DIAGNOSIS — N179 Acute kidney failure, unspecified: Secondary | ICD-10-CM | POA: Diagnosis not present

## 2021-12-11 DIAGNOSIS — I129 Hypertensive chronic kidney disease with stage 1 through stage 4 chronic kidney disease, or unspecified chronic kidney disease: Secondary | ICD-10-CM | POA: Diagnosis not present

## 2021-12-26 DIAGNOSIS — N183 Chronic kidney disease, stage 3 unspecified: Secondary | ICD-10-CM | POA: Diagnosis not present

## 2022-01-01 ENCOUNTER — Ambulatory Visit (INDEPENDENT_AMBULATORY_CARE_PROVIDER_SITE_OTHER): Payer: HMO | Admitting: Internal Medicine

## 2022-01-01 ENCOUNTER — Encounter: Payer: Self-pay | Admitting: Internal Medicine

## 2022-01-01 VITALS — BP 130/76 | HR 75 | Ht 63.0 in | Wt 233.6 lb

## 2022-01-01 DIAGNOSIS — E1159 Type 2 diabetes mellitus with other circulatory complications: Secondary | ICD-10-CM | POA: Diagnosis not present

## 2022-01-01 DIAGNOSIS — E669 Obesity, unspecified: Secondary | ICD-10-CM

## 2022-01-01 DIAGNOSIS — E1165 Type 2 diabetes mellitus with hyperglycemia: Secondary | ICD-10-CM | POA: Diagnosis not present

## 2022-01-01 DIAGNOSIS — E785 Hyperlipidemia, unspecified: Secondary | ICD-10-CM

## 2022-01-01 LAB — POCT GLYCOSYLATED HEMOGLOBIN (HGB A1C): Hemoglobin A1C: 7 % — AB (ref 4.0–5.6)

## 2022-01-01 NOTE — Progress Notes (Signed)
Patient ID: [name], female   DOB: May 31, 1950, 71 y.o.   MRN: [id number]   HPI: [name] is a 71 y.o.-year-old female, initially referred by her PCP, Dr. Harley Alto for f/u for DM2, dx in 1980s, insulin-dependent, controlled, with complications (PN - s/p B toe amputations 2/2 osteomyelitis, PAD, foot ulcer). She saw Dr. Elyse Hsu previously. Last visit with me 4 months ago.  Interim history: No increased urination, blurry vision, nausea, chest pain. Before last visit she had abdominal pain, which is suspected was from NovoLog, but it turned out that it was related to turmeric supplements.  She stopped these.  Abdominal pain improved. 3 weeks ago she established care with a weight loss center - 1400 calorie diet. She lost 7 lbs and sugars improved.  Reviewed her HbA1c levels: Lab Results  Component Value Date   HGBA1C 8.5 (A) 05/31/2021   HGBA1C 8.5 (A) 02/21/2021   HGBA1C 7.8 (H) 12/14/2020  09/30/2020: HbA1c calculated from fructosamine is much better, at 6.6%, correlating more with her blood sugars at home. 02/16/2016: 7.1% 06/2015: 6.6%  Pt is on a regimen of: - Jardiance 25 mg daily in am - Glipizide 5 mg 2x a day before meals - Tresiba 20 >> ... 52 units daily  (felt poorly at 54 units daily) - Novolog 7 to 10 >> ... 12-16 (20) units before each meal  She stopped Ozempic 04/2019 due to GI symptoms.  We retried it in 09/2019 with same symptoms so he had to stop. She stopped Metformin ER >> AP and diarrhea She was on Victoza >> rash. In 09/2016, she had a steroid inj in back >> sugars 200s >> used Novolog for 3 days.  She initially refused insulin.  Pt checks her sugars 1-2x a day: - am: 93, 123-215 >> 91-134 >> 87-155, 167, 209 - 2h after b'fast: n/c >> 101-175 >> 96-141 >> n/c - before lunch: 253, 287 >> 70, 115-140, 276 >> n/c - 2h after lunch: 120-152 >> 140-200 >> 132-145 >> 77 - before dinner: 162, 170  >> 130-137, 186 >> 130-150 - 2h after dinner:  115-275 >> n/c >> 88-130, 189, 193 - bedtime:  130-205 >> n/c >> 115-184, 222 (stress) - nighttime: n/c Lowest sugar was 70 >> 77; she has hypoglycemia awareness in the 90s. Highest sugar was  420 (steroids) >> 276 >> 237.  Glucometer: One Touch Verio  -+ CKD, last BUN/creatinine:  Lab Results  Component Value Date   BUN 55 (H) 06/13/2021   BUN 40 (H) 12/23/2020   CREATININE 1.55 (H) 06/13/2021   CREATININE 1.39 (H) 12/23/2020  11/21/2020: CMP normal, except glucose 106, BUN/creatinine 49/1.36, GFR 43 05/25/2020: CMP normal except: Glu 129, BUNs/creatinine 44/1.29, GFR 41, otherwise normal 12/31/2019: 42/1.37, GFR 38, glucose 193, otherwise normal CMP 03/21/2018: CMP normal with the exception of high glucose at 143, BUN/creatinine 37/1.26, GFR 43 (kidney function improved from 09/2017) Urinalysis with 1+ protein 09/13/2017: Glucose 123, BUN 44/creatinine 1.34, GFR 40, previously 44 03/06/2017: ACR 222.8 08/29/2016: ACR 76.7 02/16/2016: 42/1.34 (GFR 40), Glu 213  12/26/2015: 25/1.23 (GFR 44), Glu 153 Restarted lisinopril.  -+ HL; last set of lipids: 11/21/2020: 117/204/35/49 05/25/2020: 106/204/33/40 12/31/2019: 102/147/33/43 Lab Results  Component Value Date   CHOL 110 02/04/2019   HDL 31.60 (L) 02/04/2019   LDLDIRECT 44.0 02/04/2019   TRIG 255.0 (H) 02/04/2019   CHOLHDL 3 02/04/2019  03/21/2018: 92/202/31/21 09/04/2017: 126/269/33/39 08/29/2016: 87/132/32/30 02/16/2016: 128/278/33/39  03/02/2014: 114/175/32/48 On Zocor 40.  - last eye exam  was 05/09/2021: No DR (Dr. Gershon Crane).  She has a history of cataract surgery.  -She has numbness and tingling in her feet.  She sees podiatry -Dr. Jacqualyn Posey.  Latest foot exam was performed on 06/13/2021. She had amputation of 1/2 of the left hallux 05/06/2017.  She had osteomyelitis of the right second toe and she is status post left partial fourth toe amputation on 06/28/2021.  She also has a history of HTN, GERD, anemia. She started Humira  (for psoriasis) but did not tolerate this >> stopped. She was in PT for her back pain. Also had dry needling.   ROS: + See HPI Musculoskeletal: + muscle aches/+ joint aches Neurological: no tremors/+ numbness/+ tingling/no dizziness  I reviewed pt's medications, allergies, PMH, social hx, family hx, and changes were documented in the history of present illness. Otherwise, unchanged from my initial visit note.  Past Medical History:  Diagnosis Date   Allergy    seasonal   Anemia in chronic renal disease 06/05/2011   Anxiety    takes Ativan tid   Asthma    has an inhaler prn   Chronic kidney disease    Cough    DDD (degenerative disc disease), cervical 06/05/2011   DDD (degenerative disc disease), lumbosacral 06/05/2011   Depression    takes Cymbalta daily   Diabetes mellitus    takes Amaryl and Metformin daily   Diabetic neuropathy (HCC)    Diverticulosis    Dizziness    GERD (gastroesophageal reflux disease)    takes Protonix prn   Headache(784.0)    occasionally   History of bronchitis    last time 39yr ago   History of colon polyps    History of MRSA infection    HTN (hypertension) 06/05/2011   takes Prinizide daily   Hyperlipidemia    takes Zocor daily   Insomnia    not on any meds at present time   Osteoarthritis    PONV (postoperative nausea and vomiting)    hard to wake up   Psoriasis 06/05/2011   on legs and uses cream prn   Seasonal allergies    but doesn't take any meds   Sleep apnea    uses CPAP   Tubular adenoma of colon    Past Surgical History:  Procedure Laterality Date   ABDOMINAL HYSTERECTOMY  1982 and 1984   AMPUTATION  02/12/2012   Procedure: AMPUTATION RAY;  Surgeon: JWylene Simmer MD;  [place] [facility]  Service: Orthopedics;  Laterality: Right;  RIGHT 2ND TOE AMPUTATION    ANTERIOR LAT LUMBAR FUSION N/A 12/22/2020   Procedure: Thoracic twelve to Lumbar one posterior lateral lumbar interbody fusion with pedicle screw fixation Thoracic ten  to Lumbar two with robot, posterior arthrodesis with allograft from Thoracic ten to Lumbar two;  Surgeon: EKristeen Miss MD;  [facility] [facility]  Service: Neurosurgery;  Laterality: N/A;   APPLICATION OF ROBOTIC ASSISTANCE FOR SPINAL PROCEDURE N/A 12/22/2020   Procedure: APPLICATION OF ROBOTIC ASSISTANCE FOR SPINAL PROCEDURE;  Surgeon: EKristeen Miss MD;  [facility]  Service: Neurosurgery;  Laterality: N/A;  3C/RM 19   BACK SURGERY     x 3   CARDIAC CATHETERIZATION     08/09/04: 50% mid AV groove CX lesion, NL LM, LAD, Ramus, EF > 60% (Dr. BGwenlyn Found   [facility] [place]   [facility]  COLONOSCOPY  05/30/2009, 09/30/13   [facility] 2011/Dr.Brodie 2015   COLONOSCOPY W/ POLYPECTOMY  06/23/2019   DILATION AND CURETTAGE OF UTERUS     ELBOW SURGERY     left d/t tendonitis   ESOPHAGOGASTRODUODENOSCOPY     LUMBAR WOUND DEBRIDEMENT N/A 07/15/2012   Procedure: I & D of Lumbar Wound: Possible Repair of CSF Leak;  Surgeon: Elaina Hoops, MD;  Location: [facility];  Service: Neurosurgery;  Laterality: N/A;  I & D of Lumbar Wound: Possible Repair of CSF Leak placement lumbar drain   POLYPECTOMY     right knee arthroscopy     x 2   SHOULDER ARTHROSCOPY Left    TOE AMPUTATION Bilateral    2nd toe   Social History   Social History   Marital status: Married    Spouse name: N/A   Number of children: 2   Occupational History   Disabled    Social History Main Topics   Smoking status: Never Smoker   Smokeless tobacco: Never Used     Comment: never used tobacco   Alcohol use No   Drug use: No   Current Outpatient Medications on File Prior to Visit  Medication Sig Dispense Refill   metroNIDAZOLE (FLAGYL) 250 MG tablet Take 1 tablet (250 mg total) by mouth 3 (three) times daily. 21 tablet 0   albuterol (PROVENTIL HFA;VENTOLIN HFA) 108 (90 BASE) MCG/ACT inhaler Inhale 1 puff into the lungs every 4 (four) hours as needed for wheezing or shortness  of breath.      aspirin 81 MG tablet Take 81 mg by mouth daily.     budesonide-formoterol (SYMBICORT) 160-4.5 MCG/ACT inhaler Inhale 1 puff into the lungs 2 (two) times daily as needed (shortness of breath).     Cholecalciferol (VITAMIN D) 50 MCG (2000 UT) tablet Take 2,000 Units by mouth daily.     clobetasol (OLUX) 0.05 % topical foam Apply 1 application topically daily.     COLLAGEN PO Take 2 capsules by mouth daily.     DULoxetine (CYMBALTA) 60 MG capsule SMARTSIG:1 By Mouth     empagliflozin (JARDIANCE) 25 MG TABS tablet Take 1 tablet (25 mg total) by mouth daily. 90 tablet 3   gabapentin (NEURONTIN) 100 MG capsule Take by mouth.     glipiZIDE (GLUCOTROL) 5 MG tablet Take 1 tablet (5 mg total) by mouth 2 (two) times daily before a meal. 180 tablet 3   hydrocortisone cream 1 % Apply 1 application topically 2 (two) times daily as needed (rash).     insulin aspart (NOVOLOG) 100 UNIT/ML injection Inject 12-20 Units into the skin 3 (three) times daily before meals. 50 mL 2   insulin degludec (TRESIBA FLEXTOUCH) 200 UNIT/ML FlexTouch Pen ADMINISTER 52 UNITS UNDER THE SKIN DAILY 18 mL 3   Insulin Pen Needle 32G X 5 MM MISC Use up to 4 needles as directed to inject insulin 400 each 3   Insulin Syringe-Needle U-100 (INSULIN SYRINGE 1CC/31GX5/16") 31G X 5/16" 1 ML MISC Use 4 syringes per day to inject insulin 400 each 3   INVOKANA 100 MG TABS tablet      Lancets (ONETOUCH ULTRASOFT) lancets      lisinopril-hydrochlorothiazide (ZESTORETIC) 20-12.5 MG tablet Take 2 tablets by mouth daily.     LORazepam (ATIVAN) 1 MG tablet Take 1 mg by mouth 2 (two) times daily as needed for anxiety.     metoprolol succinate (TOPROL-XL) 100 MG 24 hr tablet Take 100 mg by mouth daily. Take with or immediately following a meal.     Multiple Vitamins-Minerals (MULTIVITAMIN WITH MINERALS)  tablet Take 1 tablet by mouth daily.      Needle, Disp, 32G X 5/16" MISC by Does not apply route.     ondansetron (ZOFRAN) 8 MG tablet  Take 8 mg by mouth every 8 (eight) hours as needed for nausea or vomiting.     ONETOUCH VERIO test strip USE AS INSTRUCTED TO TEST TWICE DAILY 200 strip 1   pantoprazole (PROTONIX) 40 MG tablet Take 40 mg by mouth daily.     Probiotic Product (PROBIOTIC DAILY PO) Take 1 capsule by mouth daily.      simvastatin (ZOCOR) 40 MG tablet Take 40 mg by mouth daily in the afternoon.     SKYRIZI PEN 150 MG/ML SOAJ Inject into the skin.     triamcinolone (KENALOG) 0.025 % ointment Apply 1 application topically 2 (two) times daily. (Patient taking differently: Apply 1 application topically 2 (two) times daily as needed (toe ulcer).) 30 g 0   No current facility-administered medications on file prior to visit.   Allergies  Allergen Reactions   Victoza [Liraglutide] Hives and Rash   Hydromorphone Hives, Diarrhea, Swelling and Rash   Ultram [Tramadol] Rash   Acetaminophen     Pt was told not to take   Betadine [Povidone Iodine] Other (See Comments)    Skin peels   Bydureon [Exenatide] Diarrhea and Nausea And Vomiting   Chlorhexidine     Exacerbates psoriasis    Codeine Other (See Comments)    Severe headaches   Contrast Media [Iodinated Contrast Media] Nausea And Vomiting   Fluoxetine Other (See Comments)   Humira [Adalimumab] Hives   Hydrocodone Other (See Comments)    headache   Invokana [Canagliflozin] Other (See Comments)    Cannot take this 2/2 PAD and toe amputations   Iodine Hives   Januvia [Sitagliptin] Diarrhea   Morphine And Related Nausea And Vomiting   Olanzapine Other (See Comments)   Ozempic (0.25 Or 0.5 Mg-Dose) [Semaglutide(0.25 Or 0.'5mg'$ -Dos)] Nausea And Vomiting   Percocet [Oxycodone-Acetaminophen] Other (See Comments)    Headache    Prednisone Diarrhea and Nausea And Vomiting   Sulfa Antibiotics Itching   Valium Other (See Comments)    headache   Vortioxetine Other (See Comments)   Amoxicillin Hives and Rash    Did it involve swelling of the face/tongue/throat, SOB, or  low BP? Unknown Did it involve sudden or severe rash/hives, skin peeling, or any reaction on the inside of your mouth or nose? Yes Did you need to seek medical attention at a hospital or doctor's office? Yes When did it last happen?      unk If all above answers are "NO", may proceed with cephalosporin use.    Darvocet [Propoxyphene N-Acetaminophen] Rash and Other (See Comments)    headache   Fentanyl And Related Rash   Wellbutrin [Bupropion Hcl] Hives and Rash   Family History  Problem Relation Age of Onset   Hypertension Mother        died from infection s/p hip surgery   Heart attack Father    Colon polyps Brother    Colon cancer Brother 59   Colon cancer Maternal Grandfather    Stomach cancer Neg Hx    Esophageal cancer Neg Hx    Rectal cancer Neg Hx    Liver cancer Neg Hx    PE: BP 130/76 (BP Location: Left Arm, Patient Position: Sitting, Cuff Size: Normal)   Pulse 75   Ht '5\' 3"'$  (1.6 m)   Wt 233 lb 9.6  oz (106 kg)   SpO2 98%   BMI 41.38 kg/m  Wt Readings from Last 3 Encounters:  01/01/22 233 lb 9.6 oz (106 kg)  08/30/21 237 lb 3.2 oz (107.6 kg)  06/01/21 230 lb (104.3 kg)   Constitutional: overweight, in NAD, walks with a cane Eyes: no exophthalmos ENT: moist mucous membranes, no masses palpated in neck, no cervical lymphadenopathy Cardiovascular: RRR, No MRG Respiratory: CTA B Musculoskeletal: no deformities Skin: moist, warm, no rashes Neurological: no tremor with outstretched hands  ASSESSMENT: 1. DM2, insulin-dependent, controlled, with complications: - PN - s/p B 2nd toe amputations 2/2 osteomyelitis - 2013 and 2015; 1/2 hallux 04/2017 - PAD -we cannot do the Invokana for her  - foot ulcer  2. HL  3.  Obesity class II  PLAN:  1. Patient with longstanding, uncontrolled, type 2 diabetes, on sulfonylurea, SGLT2 inhibitor and basal-bolus insulin regimen, with still poor control.  At last visit, HbA1c was better, at 7.6%, decreased from 8.5%.  The  previous HbA1c was high most likely due to steroid injections in her back. -Of note, we tried Ozempic in the past but she developed diverticulitis and had to stop.  We retried it afterwards and she again developed abdominal pain.  We decided to continue without GLP-1 receptor agonist. -At last visit, sugars are much better in the previous 1.5 months after resolution of her GI symptoms and since she did not have any steroid injections.  In fact, most of her blood sugars were at or close to goal.  We did not change her regimen.   -At today's visit, sugars are mostly at goal or only slightly above in the morning, and they are also at or close to goal later in the day.  She had occasional hyperglycemic spikes, when she had more stress (her brother had a stroke), and mostly before she started the recent weight loss regimen.  She is planning to continue with changes in her diet.  Therefore, although her sugars are not quite at goal, but now, we decided to continue the current regimen.  At next visit, if she continues to lose weight and improve her blood sugars, my goal would be to start reducing her insulin and possibly stopping her sulfonylurea. - I suggested to:  Patient Instructions  Please continue: - Jardiance 25 mg daily before b'fast - Glipizide 5 mg 2x a day before meals - Tresiba 52 units at bedtime  - Novolog 12-16 units 15 min before a meal 3x a day (up to 20 units with steroids)  Please come back for a follow-up appointment in 4 months.  - we checked her HbA1c: 7.0% (much better) - advised to check sugars at different times of the day - 4x a day, rotating check times - advised for yearly eye exams >> she is UTD - return to clinic in 4 months  2. HL - Reviewed latest lipid panel from 11/2020: Fractions at goal with exception of high triglycerides -She continues on Zocor 40 mg daily without side effects  3.  Obesity class II -She continues on Jardiance, which should also help with weight  loss -Currently, she could not tolerate Ozempic due to diverticulitis and abdominal pain.  This would have helped with weight loss. -She gained weight previously, but weight was stable at last visit.  At that time, we discussed about improving diet.  As of now, she joined a weight loss center (she was going there long ago and reestablish care with them).  She is  on a 1400-calorie diet.  She is doing well on the diet and would like to continue.  On my scale, she lost 4 pounds since last visit, but on her scale at home, she lost 7 pounds since she started working with them.  Philemon Kingdom, MD PhD [facility]

## 2022-01-01 NOTE — Addendum Note (Signed)
Addended by: Lauralyn Primes on: 01/01/2022 09:05 AM   Modules accepted: Orders

## 2022-01-01 NOTE — Patient Instructions (Signed)
Please continue: - Jardiance 25 mg daily before b'fast - Glipizide 5 mg 2x a day before meals - Tresiba 52 units at bedtime  - Novolog 12-16 units 15 min before a meal 3x a day (up to 20 units with steroids)  Please come back for a follow-up appointment in 4 months.

## 2022-01-15 ENCOUNTER — Ambulatory Visit: Payer: HMO | Admitting: Podiatry

## 2022-01-18 DIAGNOSIS — L408 Other psoriasis: Secondary | ICD-10-CM | POA: Diagnosis not present

## 2022-01-22 ENCOUNTER — Other Ambulatory Visit: Payer: Self-pay | Admitting: Internal Medicine

## 2022-01-24 ENCOUNTER — Encounter: Payer: Self-pay | Admitting: Internal Medicine

## 2022-02-08 ENCOUNTER — Ambulatory Visit: Payer: HMO | Admitting: Podiatry

## 2022-02-08 DIAGNOSIS — B351 Tinea unguium: Secondary | ICD-10-CM | POA: Diagnosis not present

## 2022-02-08 DIAGNOSIS — M79675 Pain in left toe(s): Secondary | ICD-10-CM | POA: Diagnosis not present

## 2022-02-08 DIAGNOSIS — M79674 Pain in right toe(s): Secondary | ICD-10-CM

## 2022-02-08 DIAGNOSIS — L84 Corns and callosities: Secondary | ICD-10-CM | POA: Diagnosis not present

## 2022-02-08 DIAGNOSIS — E1149 Type 2 diabetes mellitus with other diabetic neurological complication: Secondary | ICD-10-CM | POA: Diagnosis not present

## 2022-02-08 NOTE — Progress Notes (Signed)
Subjective: Chief Complaint  Patient presents with   Diabetes    Diabetic foot care, A1c-7.0 BG- 108 (yesterday) Nail and callus trim, 3rd toe on the left foot is sore at the top of the toe,     [name] is a 71 y.o. is seen today in office for diabetic foot check.  Gets occasional discomfort to the left third toe at the tip of it.  No skin breakdown she reports no drainage.  Nails are also causing discomfort as they are elongated.  Objective: General: No acute distress, AAOx3  DP/PT pulses palpable 2/4, CRT < 3 sec to all digits.  Left foot: Incision is well-healed.  There is no open lesions. Nails are hypertrophic, dystrophic, elongated x 6.  Nails affected are 1, 3, 4, 5 on the right and 3, 5 on the right.  No edema, erythema or signs of infection of the nail sites.  Remote toe amputations the other toes. Preulcerative callus noted the distal aspect left third toe.  There is no underlying ulceration but very minimal dried blood present. Hammertoes present. No pain with calf compression, swelling, warmth, erythema.   Assessment and Plan:  Symptomatic onychosis, preulcerative callus  -Treatment options discussed including all alternatives, risks, and complications -Sharply debrided the callus x1 without any complications or bleeding.  Dispensed offloading pads. -Nails sharply debrided x6 without any complications or bleeding. -Daily foot inspection, glucose control discussed.  Return in about 9 weeks (around 04/12/2022).  Trula Slade DPM

## 2022-02-12 ENCOUNTER — Other Ambulatory Visit: Payer: Self-pay

## 2022-02-12 ENCOUNTER — Encounter: Payer: Self-pay | Admitting: Internal Medicine

## 2022-02-12 MED ORDER — METRONIDAZOLE 250 MG PO TABS: 250.0000 mg | ORAL_TABLET | Freq: Three times a day (TID) | ORAL | 0 refills | Status: DC

## 2022-02-12 NOTE — Telephone Encounter (Signed)
Patient with history of diverticulitis Okay for metronidazole 250 mg 3 times daily x7 to 10 days  If not getting better and certainly if symptoms worsening she should let us know and she should be seen and have lab work plus stool studies  Thanks

## 2022-02-14 ENCOUNTER — Encounter: Payer: Self-pay | Admitting: Podiatry

## 2022-02-15 ENCOUNTER — Other Ambulatory Visit: Payer: Self-pay | Admitting: Podiatry

## 2022-02-15 MED ORDER — DOXYCYCLINE HYCLATE 100 MG PO TABS: 100.0000 mg | ORAL_TABLET | Freq: Two times a day (BID) | ORAL | 0 refills | Status: DC

## 2022-02-16 ENCOUNTER — Encounter: Payer: Self-pay | Admitting: Podiatry

## 2022-02-16 ENCOUNTER — Encounter: Payer: Self-pay | Admitting: Internal Medicine

## 2022-02-16 NOTE — Telephone Encounter (Signed)
Patient picked up Novolog vial - sample

## 2022-02-16 NOTE — Telephone Encounter (Signed)
Sample labeled for pt pick up. 

## 2022-02-27 ENCOUNTER — Other Ambulatory Visit: Payer: Self-pay | Admitting: Internal Medicine

## 2022-02-27 DIAGNOSIS — Z794 Long term (current) use of insulin: Secondary | ICD-10-CM

## 2022-02-27 DIAGNOSIS — E1159 Type 2 diabetes mellitus with other circulatory complications: Secondary | ICD-10-CM

## 2022-03-01 ENCOUNTER — Encounter: Payer: Self-pay | Admitting: Podiatry

## 2022-03-01 ENCOUNTER — Other Ambulatory Visit: Payer: Self-pay | Admitting: Podiatry

## 2022-03-01 MED ORDER — DOXYCYCLINE HYCLATE 100 MG PO TABS: 100.0000 mg | ORAL_TABLET | Freq: Two times a day (BID) | ORAL | 0 refills | Status: DC

## 2022-03-01 NOTE — Telephone Encounter (Signed)
Can you please schedule follow-up? Thanks!

## 2022-03-06 ENCOUNTER — Ambulatory Visit: Payer: HMO | Admitting: Podiatry

## 2022-03-06 ENCOUNTER — Ambulatory Visit: Payer: HMO

## 2022-03-06 DIAGNOSIS — L97521 Non-pressure chronic ulcer of other part of left foot limited to breakdown of skin: Secondary | ICD-10-CM | POA: Diagnosis not present

## 2022-03-06 DIAGNOSIS — L84 Corns and callosities: Secondary | ICD-10-CM

## 2022-03-06 DIAGNOSIS — M2041 Other hammer toe(s) (acquired), right foot: Secondary | ICD-10-CM

## 2022-03-06 DIAGNOSIS — E1149 Type 2 diabetes mellitus with other diabetic neurological complication: Secondary | ICD-10-CM

## 2022-03-06 DIAGNOSIS — M2042 Other hammer toe(s) (acquired), left foot: Secondary | ICD-10-CM | POA: Diagnosis not present

## 2022-03-06 NOTE — Patient Instructions (Signed)
Wash with soap and water. Dry well. Apply a small amount of iodosorb and a bandage daily.  Monitor for any signs/symptoms of infection. Call the office immediately if any occur or go directly to the emergency room. Call with any questions/concerns.

## 2022-03-06 NOTE — Progress Notes (Signed)
Subjective: Chief Complaint  Patient presents with   Diabetic Ulcer    Diabetic A1c- 7.0 BG- 82, Left 3rd toe ulcer     71 year old female presents for above concerns.  She said that she has noticed a spot in the left third toe.  I placed her prescribed antibiotics for her.  She states is doing at the same.  No drainage or pus.  No recent swelling or redness.  Denies any fevers or chills.  She has no other concerns.  Objective: AAO x3, NAD DP/PT pulses palpable bilaterally, CRT less than 3 seconds History of multiple digital amputations.  On the left third toes hammertoe resulting in pressure the distal portion of the toe.  She previously had a preulcerative callus to the area.  This is since turned into a wound.  This granulation tissue present which is superficial.  No significant edema, erythema or present there is no ascending cellulitis.  No fluctuance or crepitation.  No malodor. No pain with calf compression, swelling, warmth, erythema  Assessment: Ulceration left third toe edema digital deformity, history of multiple toe amputations  Plan: -All treatment options discussed with the patient including all alternatives, risks, complications.  -X-rays obtained reviewed.  3 views left foot were obtained.  No evidence of acute fracture.  Multiple amputations are noted.  Hammertoes present which makes it difficult to fully evaluate the distal portion of toe with there is a ulceration but no soft tissue edema or obvious signs of osteomyelitis at this time. -Finish course of antibiotics.  Currently on doxycycline which was prescribed on March 01, 2022 -Continue with daily dressing changes.  Apply Iodosorb to the wound daily.  Continue offloading. -Patient encouraged to call the office with any questions, concerns, change in symptoms.   Trula Slade DPM

## 2022-03-15 ENCOUNTER — Other Ambulatory Visit: Payer: Self-pay

## 2022-03-15 DIAGNOSIS — E1159 Type 2 diabetes mellitus with other circulatory complications: Secondary | ICD-10-CM

## 2022-03-15 MED ORDER — "INSULIN SYRINGE 31G X 5/16"" 1 ML MISC": 3 refills | Status: DC

## 2022-03-15 MED ORDER — GLIPIZIDE 5 MG PO TABS: 5.0000 mg | ORAL_TABLET | Freq: Two times a day (BID) | ORAL | 3 refills | Status: DC

## 2022-03-15 NOTE — Addendum Note (Signed)
Addended by: Lauralyn Primes on: 03/15/2022 10:00 AM   Modules accepted: Orders

## 2022-03-22 ENCOUNTER — Ambulatory Visit (INDEPENDENT_AMBULATORY_CARE_PROVIDER_SITE_OTHER): Payer: HMO

## 2022-03-22 ENCOUNTER — Ambulatory Visit: Payer: HMO | Admitting: Podiatry

## 2022-03-22 DIAGNOSIS — M79672 Pain in left foot: Secondary | ICD-10-CM

## 2022-03-22 DIAGNOSIS — L97521 Non-pressure chronic ulcer of other part of left foot limited to breakdown of skin: Secondary | ICD-10-CM

## 2022-03-24 NOTE — Progress Notes (Signed)
Subjective: Chief Complaint  Patient presents with   Diabetic Ulcer    Left foot 3rd toe ulcer,    71 year old female presents for above concerns.  She says herself is doing fine.  No drainage or pus.  No swelling or redness.  She states that she did have a fall after her knee gave out.  She had some pain to her left foot after the pain is since much improved.  No open lesions otherwise that she reports.  No fevers or chills.  Objective: AAO x3, NAD DP/PT pulses palpable bilaterally, CRT less than 3 seconds History of multiple digital amputations.  On the left third toes hammertoe resulting in pressure the distal portion of the toe.  There is still 1 small superficial granular wound present.  No probing, and or tunneling.  No surrounding erythema, ascending cellulitis.  There is no fluctuation or crepitation.  No malodor.  MRI results in the area pinpoint tenderness.  Subjectively she was getting some discomfort the dorsal forefoot.  Flexor, extensor tendons appear to be intact.  No pain with calf compression, swelling, warmth, erythema  Assessment: Ulceration left third toe edema digital deformity, right foot pain status post fall  Plan: -All treatment options discussed with the patient including all alternatives, risks, complications.  -X-rays obtained reviewed.  3 views left foot were obtained.   Multiple amputations are noted.  Hammertoes present which makes it difficult to fully evaluate the distal portion of toe with there is a ulceration but no soft tissue edema or obvious signs of osteomyelitis at this time.  There is no evidence of acute fracture noted today. -I did not debride the wound today there is not significant tissue debris.  Continue with as her dressing changes daily.  We will need to monitor very closely for any signs or symptoms of infection, offloading. -Follow-up with PCP for knee weakness.  Trula Slade DPM

## 2022-04-02 ENCOUNTER — Other Ambulatory Visit: Payer: Self-pay | Admitting: Podiatry

## 2022-04-02 DIAGNOSIS — L97521 Non-pressure chronic ulcer of other part of left foot limited to breakdown of skin: Secondary | ICD-10-CM

## 2022-04-02 DIAGNOSIS — M79672 Pain in left foot: Secondary | ICD-10-CM

## 2022-04-12 ENCOUNTER — Ambulatory Visit: Payer: HMO | Admitting: Podiatry

## 2022-04-12 DIAGNOSIS — E1149 Type 2 diabetes mellitus with other diabetic neurological complication: Secondary | ICD-10-CM | POA: Diagnosis not present

## 2022-04-12 DIAGNOSIS — L84 Corns and callosities: Secondary | ICD-10-CM

## 2022-04-14 NOTE — Progress Notes (Signed)
Subjective: No chief complaint on file.  71 year old female presents the office today for follow-up evaluation of the wound on the left third toe.  She states that she is doing well and she feels that she will be released today.  No drainage or pus.  No swelling or redness.  No fevers or chills.  No other concerns.  Objective: AAO x3, NAD DP/PT pulses palpable bilaterally, CRT less than 3 seconds History of multiple digital amputations.  Mild hyperkeratotic tissue the distal aspect left third toe but there is no underlying ulceration, drainage or any signs of infection noted today.  There is no open lesions present. No pain with calf compression, swelling, warmth, erythema  Assessment: Ulceration left third toe edema digital deformity  Plan: -All treatment options discussed with the patient including all alternatives, risks, complications.  -I lightly debrided hyperkeratotic tissue without any complications or bleeding.  There is no ulceration noted today.  It appears everything is healed at this time.  I continue to bandage for offloading and daily foot inspection.  Regular shoe as tolerated.  Discussed the importance of daily foot inspection.  Trula Slade DPM

## 2022-05-04 ENCOUNTER — Ambulatory Visit (INDEPENDENT_AMBULATORY_CARE_PROVIDER_SITE_OTHER): Payer: HMO | Admitting: Internal Medicine

## 2022-05-04 ENCOUNTER — Encounter: Payer: Self-pay | Admitting: Internal Medicine

## 2022-05-04 VITALS — BP 130/64 | HR 57 | Ht 63.0 in | Wt 240.6 lb

## 2022-05-04 DIAGNOSIS — E669 Obesity, unspecified: Secondary | ICD-10-CM

## 2022-05-04 DIAGNOSIS — E1159 Type 2 diabetes mellitus with other circulatory complications: Secondary | ICD-10-CM | POA: Diagnosis not present

## 2022-05-04 DIAGNOSIS — E785 Hyperlipidemia, unspecified: Secondary | ICD-10-CM

## 2022-05-04 DIAGNOSIS — Z23 Encounter for immunization: Secondary | ICD-10-CM

## 2022-05-04 DIAGNOSIS — E1165 Type 2 diabetes mellitus with hyperglycemia: Secondary | ICD-10-CM | POA: Diagnosis not present

## 2022-05-04 LAB — POCT GLYCOSYLATED HEMOGLOBIN (HGB A1C): Hemoglobin A1C: 6.8 % — AB (ref 4.0–5.6)

## 2022-05-04 NOTE — Patient Instructions (Addendum)
Please continue: - Jardiance 25 mg daily before b'fast - Glipizide 5 mg 2x a day before meals - Novolog 12-16 units 15 min before a meal 3x a day (up to 20 units with steroids)  After the Holidays to decrease: - Tresiba 46 units at bedtime   Please come back for a follow-up appointment in 4 months.

## 2022-05-04 NOTE — Progress Notes (Signed)
Patient ID: [name], female   DOB: 1951-02-13, 71 y.o.   MRN: [id number]   HPI: [name] is a 71 y.o.-year-old female, initially referred by her PCP, Dr. Brigitte Padilla, returning for f/u for DM2, dx in 1980s, insulin-dependent, controlled, with complications (PN - s/p B toe amputations 2/2 osteomyelitis, PAD, foot ulcer). She saw Dr. Elyse Padilla previously. Last visit with me 4 months ago.  Interim history: No increased urination, blurry vision, nausea, chest pain. 3 weeks prior to our last visit she established care with a weight loss center - 1400 calorie diet. Now off the diet during the Holidays - gained 7 lbs. She has a L toe ulver >> sees Dr. Jacqualyn Padilla >> finished Doxycycline >> improving.  Reviewed her HbA1c levels: Lab Results  Component Value Date   HGBA1C 7.6 (A) 08/30/2021   HGBA1C 8.5 (A) 05/31/2021   HGBA1C 8.5 (A) 02/21/2021   HGBA1C 7.8 (H) 12/14/2020   HGBA1C 8.4 (A) 09/30/2020   HGBA1C 8.1 (A) 06/30/2020   HGBA1C 9.4 (A) 04/22/2020   HGBA1C 9.1 (A) 01/20/2020   HGBA1C 10.3 (A) 09/29/2019   HGBA1C 9.5 (A) 07/01/2019   HGBA1C 8.4 (A) 02/04/2019   HGBA1C 8.0 (A) 09/30/2018   HGBA1C 7.1 (A) 05/29/2018   HGBA1C 6.7 (A) 01/27/2018   HGBA1C 7.3 (A) 09/24/2017   HGBA1C 7.0 05/27/2017   HGBA1C 6.8 01/24/2017   HGBA1C 6.8 08/07/2016   HGBA1C 6.8 05/10/2016   HGBA1C 7.3 (H) 02/10/2012  09/30/2020: HbA1c calculated from fructosamine is much better, at 6.6%, correlating more with her blood sugars at home. 02/16/2016: 7.1% 06/2015: 6.6%  Pt is on a regimen of: - Jardiance 25 mg daily in am - Glipizide 5 mg 2x a day before meals - Tresiba 20 >> ... 52 units daily  (felt poorly at 54 units daily) - Novolog 7 to 10 >> ... 12-16 (20) units before each meal  She stopped Ozempic 04/2019 due to GI symptoms.  We retried it in 09/2019 with same symptoms so he had to stop. She stopped Metformin ER >> AP and diarrhea She was on Victoza >> rash. In 09/2016, she had a steroid inj  in back >> sugars 200s >> used Novolog for 3 days.  She initially refused insulin.  Pt checks her sugars 1-2x a day: - am: 93, 123-215 >> 91-134 >> 87-155, 167, 209 >> 66, 69, 91-134 - 2h after b'fast: n/c >> 101-175 >> 96-141 >> n/c >> 115-175 - before lunch: 253, 287 >> 70, 115-140, 276 >> n/c >> 70-150 - 2h after lunch: 120-152 >> 140-200 >> 132-145 >> 77 >> 94-150 - before dinner: 162, 170  >> 130-137, 186 >> 130-150 >> 118-145, 160 - 2h after dinner: 115-275 >> n/c >> 88-130, 189, 193 >> 110-156, 290- sick - bedtime:  130-205 >> n/c >> 115-184, 222 (stress) >> 110-140 - nighttime: n/c Lowest sugar was 70 >> 77 >> 66; she has hypoglycemia awareness in the 90s. Highest sugar was  420 (steroids) >> 276 >> 237 >> 290 x1.  Glucometer: One Touch Verio  -+ CKD, last BUN/creatinine:  Lab Results  Component Value Date   BUN 55 (H) 06/13/2021   BUN 40 (H) 12/23/2020   CREATININE 1.55 (H) 06/13/2021   CREATININE 1.39 (H) 12/23/2020  11/21/2020: CMP normal, except glucose 106, BUN/creatinine 49/1.36, GFR 43 05/25/2020: CMP normal except: Glu 129, BUNs/creatinine 44/1.29, GFR 41, otherwise normal 12/31/2019: 42/1.37, GFR 38, glucose 193, otherwise normal CMP 03/21/2018: CMP normal with the exception of  high glucose at 143, BUN/creatinine 37/1.26, GFR 43 (kidney function improved from 09/2017) Urinalysis with 1+ protein 09/13/2017: Glucose 123, BUN 44/creatinine 1.34, GFR 40, previously 44 03/06/2017: ACR 222.8 08/29/2016: ACR 76.7 02/16/2016: 42/1.34 (GFR 40), Glu 213  12/26/2015: 25/1.23 (GFR 44), Glu 153 Restarted lisinopril.  -+ HL; last set of lipids: 11/21/2020: 117/204/35/49 05/25/2020: 106/204/33/40 12/31/2019: 102/147/33/43 Lab Results  Component Value Date   CHOL 110 02/04/2019   HDL 31.60 (L) 02/04/2019   LDLDIRECT 44.0 02/04/2019   TRIG 255.0 (H) 02/04/2019   CHOLHDL 3 02/04/2019  03/21/2018: 92/202/31/21 09/04/2017: 126/269/33/39 08/29/2016: 87/132/32/30 02/16/2016:  128/278/33/39  03/02/2014: 114/175/32/48 On Zocor 40.  - last eye exam was 05/09/2021: No DR (Dr. Gershon Padilla).  She has a history of cataract surgery.  -She has numbness and tingling in her feet.  She sees podiatry -Dr. Jacqualyn Padilla.  Latest foot exam was performed on 04/12/2022. She had amputation of 1/2 of the left hallux 05/06/2017.  She had osteomyelitis of the right second toe and she is status post left partial fourth toe amputation on 06/28/2021.  She also has a history of HTN, GERD, anemia. She started Humira (for psoriasis) but did not tolerate this >> stopped. She was in PT for her back pain. Also had dry needling.   ROS: + See HPI  I reviewed pt's medications, allergies, PMH, social hx, family hx, and changes were documented in the history of present illness. Otherwise, unchanged from my initial visit note.  Past Medical History:  Diagnosis Date   Allergy    seasonal   Anemia in chronic renal disease 06/05/2011   Anxiety    takes Ativan tid   Asthma    has an inhaler prn   Chronic kidney disease    Cough    DDD (degenerative disc disease), cervical 06/05/2011   DDD (degenerative disc disease), lumbosacral 06/05/2011   Depression    takes Cymbalta daily   Diabetes mellitus    takes Amaryl and Metformin daily   Diabetic neuropathy (HCC)    Diverticulosis    Dizziness    GERD (gastroesophageal reflux disease)    takes Protonix prn   Headache(784.0)    occasionally   History of bronchitis    last time 71yr ago   History of colon polyps    History of MRSA infection    HTN (hypertension) 06/05/2011   takes Prinizide daily   Hyperlipidemia    takes Zocor daily   Insomnia    not on any meds at present time   Osteoarthritis    PONV (postoperative nausea and vomiting)    hard to wake up   Psoriasis 06/05/2011   on legs and uses cream prn   Seasonal allergies    but doesn't take any meds   Sleep apnea    uses CPAP   Tubular adenoma of colon    Past Surgical  History:  Procedure Laterality Date   ABDOMINAL HYSTERECTOMY  1982 and 1984   AMPUTATION  02/12/2012   Procedure: AMPUTATION RAY;  Surgeon: Sherri Simmer MD;  [facility]  Service: Orthopedics;  Laterality: Right;  RIGHT 2ND TOE AMPUTATION    ANTERIOR LAT LUMBAR FUSION N/A 12/22/2020   Procedure: Thoracic twelve to Lumbar one posterior lateral lumbar interbody fusion with pedicle screw fixation Thoracic ten to Lumbar two with robot, posterior arthrodesis with allograft from Thoracic ten to Lumbar two;  Surgeon: EKristeen Miss MD;  [facility]  Service: Neurosurgery;  Laterality: N/A;   APPLICATION OF ROBOTIC ASSISTANCE  FOR SPINAL PROCEDURE N/A 12/22/2020   Procedure: APPLICATION OF ROBOTIC ASSISTANCE FOR SPINAL PROCEDURE;  Surgeon: Kristeen Miss, MD;  Location: [facility];  Service: Neurosurgery;  Laterality: N/A;  3C/RM 19   BACK SURGERY     x 3   CARDIAC CATHETERIZATION     08/09/04: 50% mid AV groove CX lesion, NL LM, LAD, Ramus, EF > 60% (Dr. Gwenlyn Found)   [place] Bilateral 2010   [place]   COLONOSCOPY  05/30/2009, 09/30/13   [facility] 2011/Dr.Brodie 2015   COLONOSCOPY W/ POLYPECTOMY  06/23/2019   DILATION AND CURETTAGE OF UTERUS     ELBOW SURGERY     left d/t tendonitis   ESOPHAGOGASTRODUODENOSCOPY     LUMBAR WOUND DEBRIDEMENT N/A 07/15/2012   Procedure: I & D of Lumbar Wound: Possible Repair of CSF Leak;  Surgeon: Elaina Hoops, MD;  Location: [facility];  Service: Neurosurgery;  Laterality: N/A;  I & D of Lumbar Wound: Possible Repair of CSF Leak placement lumbar drain   POLYPECTOMY     right knee arthroscopy     x 2   SHOULDER ARTHROSCOPY Left    TOE AMPUTATION Bilateral    2nd toe   Social History   Social History   Marital status: Married    Spouse name: N/A   Number of children: 2   Occupational History   Disabled    Social History Main Topics   Smoking status: Never Smoker   Smokeless tobacco: Never Used      Comment: never used tobacco   Alcohol use No   Drug use: No   Current Outpatient Medications on File Prior to Visit  Medication Sig Dispense Refill   metroNIDAZOLE (FLAGYL) 250 MG tablet Take 1 tablet (250 mg total) by mouth 3 (three) times daily. 21 tablet 0   albuterol (PROVENTIL HFA;VENTOLIN HFA) 108 (90 BASE) MCG/ACT inhaler Inhale 1 puff into the lungs every 4 (four) hours as needed for wheezing or shortness of breath.      aspirin 81 MG tablet Take 81 mg by mouth daily.     budesonide-formoterol (SYMBICORT) 160-4.5 MCG/ACT inhaler Inhale 1 puff into the lungs 2 (two) times daily as needed (shortness of breath).     Cholecalciferol (VITAMIN D) 50 MCG (2000 UT) tablet Take 2,000 Units by mouth daily.     clobetasol (OLUX) 0.05 % topical foam Apply 1 application topically daily.     COLLAGEN PO Take 2 capsules by mouth daily.     doxycycline (VIBRA-TABS) 100 MG tablet Take 1 tablet (100 mg total) by mouth 2 (two) times daily. 20 tablet 0   DULoxetine (CYMBALTA) 60 MG capsule SMARTSIG:1 By Mouth     glipiZIDE (GLUCOTROL) 5 MG tablet Take 1 tablet (5 mg total) by mouth 2 (two) times daily before a meal. 180 tablet 3   hydrocortisone cream 1 % Apply 1 application topically 2 (two) times daily as needed (rash).     insulin aspart (NOVOLOG) 100 UNIT/ML injection Inject 12-20 Units into the skin 3 (three) times daily before meals. 50 mL 2   insulin degludec (TRESIBA FLEXTOUCH) 200 UNIT/ML FlexTouch Pen ADMINISTER 52 UNITS UNDER THE SKIN DAILY 18 mL 3   Insulin Pen Needle 32G X 5 MM MISC Use up to 4 needles as directed to inject insulin 400 each 3   Insulin Syringe-Needle U-100 (INSULIN SYRINGE 1CC/31GX5/16") 31G X 5/16" 1 ML MISC Use 4 syringes per day to  inject insulin 400 each 3   JARDIANCE 25 MG TABS tablet TAKE 1 TABLET(25 MG) BY MOUTH DAILY 90 tablet 3   Lancets (ONETOUCH ULTRASOFT) lancets      lisinopril-hydrochlorothiazide (ZESTORETIC) 20-12.5 MG tablet Take 2 tablets by mouth daily.      LORazepam (ATIVAN) 1 MG tablet Take 1 mg by mouth 2 (two) times daily as needed for anxiety.     metoprolol succinate (TOPROL-XL) 100 MG 24 hr tablet Take 100 mg by mouth daily. Take with or immediately following a meal.     metroNIDAZOLE (FLAGYL) 250 MG tablet Take 1 tablet (250 mg total) by mouth 3 (three) times daily. 30 tablet 0   Multiple Vitamins-Minerals (MULTIVITAMIN WITH MINERALS) tablet Take 1 tablet by mouth daily.      Needle, Disp, 32G X 5/16" MISC by Does not apply route.     ondansetron (ZOFRAN) 8 MG tablet Take 8 mg by mouth every 8 (eight) hours as needed for nausea or vomiting.     ONETOUCH VERIO test strip USE AS INSTRUCTED TO TEST TWICE DAILY 200 strip 1   pantoprazole (PROTONIX) 40 MG tablet Take 40 mg by mouth daily.     Probiotic Product (PROBIOTIC DAILY PO) Take 1 capsule by mouth daily.      simvastatin (ZOCOR) 40 MG tablet Take 40 mg by mouth daily in the afternoon.     SKYRIZI PEN 150 MG/ML SOAJ Inject into the skin.     triamcinolone (KENALOG) 0.025 % ointment Apply 1 application topically 2 (two) times daily. (Patient taking differently: Apply 1 application topically 2 (two) times daily as needed (toe ulcer).) 30 g 0   No current facility-administered medications on file prior to visit.   Allergies  Allergen Reactions   Victoza [Liraglutide] Hives and Rash   Hydromorphone Hives, Diarrhea, Swelling and Rash   Ultram [Tramadol] Rash   Acetaminophen     Pt was told not to take   Betadine [Povidone Iodine] Other (See Comments)    Skin peels   Bydureon [Exenatide] Diarrhea and Nausea And Vomiting   Chlorhexidine     Exacerbates psoriasis    Codeine Other (See Comments)    Severe headaches   Contrast Media [Iodinated Contrast Media] Nausea And Vomiting   Fluoxetine Other (See Comments)   Humira [Adalimumab] Hives   Hydrocodone Other (See Comments)    headache   Invokana [Canagliflozin] Other (See Comments)    Cannot take this 2/2 PAD and toe amputations    Iodine Hives   Januvia [Sitagliptin] Diarrhea   Morphine And Related Nausea And Vomiting   Olanzapine Other (See Comments)   Ozempic (0.25 Or 0.5 Mg-Dose) [Semaglutide(0.25 Or 0.'5mg'$ -Dos)] Nausea And Vomiting   Percocet [Oxycodone-Acetaminophen] Other (See Comments)    Headache    Prednisone Diarrhea and Nausea And Vomiting   Sulfa Antibiotics Itching   Valium Other (See Comments)    headache   Vortioxetine Other (See Comments)   Amoxicillin Hives and Rash    Did it involve swelling of the face/tongue/throat, SOB, or low BP? Unknown Did it involve sudden or severe rash/hives, skin peeling, or any reaction on the inside of your mouth or nose? Yes Did you need to seek medical attention at a hospital or doctor's office? Yes When did it last happen?      unk If all above answers are "NO", may proceed with cephalosporin use.    Darvocet [Propoxyphene N-Acetaminophen] Rash and Other (See Comments)    headache   Fentanyl And  Related Rash   Wellbutrin [Bupropion Hcl] Hives and Rash   Family History  Problem Relation Age of Onset   Hypertension Mother        died from infection s/p hip surgery   Heart attack Father    Colon polyps Brother    Colon cancer Brother 32   Colon cancer Maternal Grandfather    Stomach cancer Neg Hx    Esophageal cancer Neg Hx    Rectal cancer Neg Hx    Liver cancer Neg Hx    PE: BP 130/64 (BP Location: Left Arm, Patient Position: Sitting, Cuff Size: Normal)   Padilla (!) 59   Ht '5\' 3"'$  (1.6 m)   Wt 240 lb 9.6 oz (109.1 kg)   SpO2 97%   BMI 42.62 kg/m  Wt Readings from Last 3 Encounters:  05/04/22 240 lb 9.6 oz (109.1 kg)  01/01/22 233 lb 9.6 oz (106 kg)  08/30/21 237 lb 3.2 oz (107.6 kg)   Constitutional: overweight, in NAD, walks with a cane Eyes: no exophthalmos ENT: no masses palpated in neck, no cervical lymphadenopathy Cardiovascular: RRR, No MRG Respiratory: CTA B Musculoskeletal: no deformities Skin: no rashes Neurological: no tremor with  outstretched hands  ASSESSMENT: 1. DM2, insulin-dependent, controlled, with complications: - PN - s/p B 2nd toe amputations 2/2 osteomyelitis - 2013 and 2015; 1/2 hallux 04/2017 - PAD -we cannot do the Invokana for her  - foot ulcer  2. HL  3.  Obesity class II  PLAN:  1. Patient with longstanding, uncontrolled, type 2 diabetes, on sulfonylurea, SGLT2 inhibitor and basal/bolus insulin regimen, with improving control.  HbA1c at last visit was improved from 7.6% to 7.0%. -At that time, sugars were mostly at goal or only slightly above in the morning and they were at or close to goal later in the day.  She had occasional hyperglycemic spikes, which she had more stress and mostly before she started going to a weight management clinic.  She was planning to continue with the changes in diet.  We did not change her regimen.  Of note, we cannot use a GLP-1 receptor agonist due to previous abdominal pain and diverticulitis with Ozempic. - at today's visit, show last eye mostly at goal throughout the day in the last 1.5 months, but she had some low values in the morning and also later in the day when she was on the previous diet.  States she is planning to return to the diet after the first of the year, we discussed about trying to decrease the dose of Tresiba at that time.  If the low sugars persist, will need to stop glipizide most likely and possibly also back off the NovoLog.  I advised her to let me know. - I suggested to:  Patient Instructions  Please continue: - Jardiance 25 mg daily before b'fast - Glipizide 5 mg 2x a day before meals - Novolog 12-16 units 15 min before a meal 3x a day (up to 20 units with steroids)  After the Holidays to decrease: - Tresiba 46 units at bedtime   Please come back for a follow-up appointment in 4 months.  - we checked her HbA1c: 6.8% (lower) - advised to check sugars at different times of the day - 4x a day, rotating check times - advised for yearly eye  exams >> she is UTD - return to clinic in 4 months  2. HL - Reviewed latest lipid panel from 11/2020: Fractions at goal with exception of high  triglycerides - Continues the statin without side effects. - she did have labs since then by Dr. Manuella Ghazi and will have an appointment with her in 06/2022.  I advised her to bring me the lab results at next visit.  3.  Obesity class II -She continues on Jardiance which should also help with weight loss -Unfortunately she could not tolerate Ozempic due to diverticulitis and abdominal pain  -Before last visit, she joined a weight loss center (she was going there long ago and reestablish care with them).  She is on a 1400-calorie diet.  She lost 4 pounds net before last visit, but gained 7 lbs since then.  She is planning to return to the diet after the first of the year.  + Flu shot today  Philemon Kingdom, MD PhD [facility]

## 2022-05-04 NOTE — Addendum Note (Signed)
Addended by: Lauralyn Primes on: 05/04/2022 10:32 AM   Modules accepted: Orders

## 2022-05-08 DIAGNOSIS — M25561 Pain in right knee: Secondary | ICD-10-CM | POA: Diagnosis not present

## 2022-05-08 DIAGNOSIS — M170 Bilateral primary osteoarthritis of knee: Secondary | ICD-10-CM | POA: Diagnosis not present

## 2022-05-10 DIAGNOSIS — Z794 Long term (current) use of insulin: Secondary | ICD-10-CM | POA: Diagnosis not present

## 2022-05-10 DIAGNOSIS — H524 Presbyopia: Secondary | ICD-10-CM | POA: Diagnosis not present

## 2022-05-10 DIAGNOSIS — E119 Type 2 diabetes mellitus without complications: Secondary | ICD-10-CM | POA: Diagnosis not present

## 2022-05-10 DIAGNOSIS — Z7984 Long term (current) use of oral hypoglycemic drugs: Secondary | ICD-10-CM | POA: Diagnosis not present

## 2022-05-10 DIAGNOSIS — H5212 Myopia, left eye: Secondary | ICD-10-CM | POA: Diagnosis not present

## 2022-05-10 DIAGNOSIS — Z961 Presence of intraocular lens: Secondary | ICD-10-CM | POA: Diagnosis not present

## 2022-05-10 DIAGNOSIS — H52202 Unspecified astigmatism, left eye: Secondary | ICD-10-CM | POA: Diagnosis not present

## 2022-05-11 ENCOUNTER — Encounter: Payer: Self-pay | Admitting: Internal Medicine

## 2022-05-15 DIAGNOSIS — J342 Deviated nasal septum: Secondary | ICD-10-CM | POA: Diagnosis not present

## 2022-05-15 DIAGNOSIS — J310 Chronic rhinitis: Secondary | ICD-10-CM | POA: Diagnosis not present

## 2022-05-15 DIAGNOSIS — H6522 Chronic serous otitis media, left ear: Secondary | ICD-10-CM | POA: Diagnosis not present

## 2022-05-15 DIAGNOSIS — H9012 Conductive hearing loss, unilateral, left ear, with unrestricted hearing on the contralateral side: Secondary | ICD-10-CM | POA: Diagnosis not present

## 2022-05-15 DIAGNOSIS — J343 Hypertrophy of nasal turbinates: Secondary | ICD-10-CM | POA: Diagnosis not present

## 2022-05-15 DIAGNOSIS — H6982 Other specified disorders of Eustachian tube, left ear: Secondary | ICD-10-CM | POA: Diagnosis not present

## 2022-05-17 ENCOUNTER — Encounter: Payer: Self-pay | Admitting: Internal Medicine

## 2022-05-21 ENCOUNTER — Encounter: Payer: Self-pay | Admitting: Internal Medicine

## 2022-05-25 ENCOUNTER — Ambulatory Visit (INDEPENDENT_AMBULATORY_CARE_PROVIDER_SITE_OTHER): Payer: PPO | Admitting: Podiatry

## 2022-05-25 DIAGNOSIS — M79675 Pain in left toe(s): Secondary | ICD-10-CM | POA: Diagnosis not present

## 2022-05-25 DIAGNOSIS — L97521 Non-pressure chronic ulcer of other part of left foot limited to breakdown of skin: Secondary | ICD-10-CM

## 2022-05-25 DIAGNOSIS — B351 Tinea unguium: Secondary | ICD-10-CM

## 2022-05-25 DIAGNOSIS — M7989 Other specified soft tissue disorders: Secondary | ICD-10-CM | POA: Diagnosis not present

## 2022-05-25 DIAGNOSIS — E1149 Type 2 diabetes mellitus with other diabetic neurological complication: Secondary | ICD-10-CM | POA: Diagnosis not present

## 2022-05-25 DIAGNOSIS — M79674 Pain in right toe(s): Secondary | ICD-10-CM | POA: Diagnosis not present

## 2022-05-25 MED ORDER — DOXYCYCLINE HYCLATE 100 MG PO TABS: 100.0000 mg | ORAL_TABLET | Freq: Two times a day (BID) | ORAL | 0 refills | Status: DC

## 2022-06-01 NOTE — Progress Notes (Unsigned)
Subjective: Chief Complaint  Patient presents with   NAIL CARE     NAIL TRIM    72 year old female presents the office today for thick, elongated nails that she not able to trim her self.  No swelling redness or any drainage that she reports however she does note that the third toe is causing issues again.  No drainage or pus that she reports but she has noticed a wound, callus forming over the last couple of days.   Objective: AAO x3, NAD DP/PT pulses palpable bilaterally, CRT less than 3 seconds Protective sensation intact with Simms Weinstein monofilament, vibratory sensation intact, Achilles tendon reflex intact No areas of pinpoint bony tenderness or pain with vibratory sensation. MMT 5/5, ROM WNL. No edema, erythema, increase in warmth to bilateral lower extremities.  No open lesions or pre-ulcerative lesions.  No pain with calf compression, swelling, warmth, erythema  Assessment:  Plan: -All treatment options discussed with the patient including all alternatives, risks, complications.   -Patient encouraged to call the office with any questions, concerns, change in symptoms.

## 2022-06-07 ENCOUNTER — Encounter: Payer: Self-pay | Admitting: Internal Medicine

## 2022-06-07 DIAGNOSIS — Z89421 Acquired absence of other right toe(s): Secondary | ICD-10-CM | POA: Diagnosis not present

## 2022-06-07 DIAGNOSIS — I129 Hypertensive chronic kidney disease with stage 1 through stage 4 chronic kidney disease, or unspecified chronic kidney disease: Secondary | ICD-10-CM | POA: Diagnosis not present

## 2022-06-07 DIAGNOSIS — Z6841 Body Mass Index (BMI) 40.0 and over, adult: Secondary | ICD-10-CM | POA: Diagnosis not present

## 2022-06-07 DIAGNOSIS — Z0000 Encounter for general adult medical examination without abnormal findings: Secondary | ICD-10-CM | POA: Diagnosis not present

## 2022-06-07 DIAGNOSIS — Z89422 Acquired absence of other left toe(s): Secondary | ICD-10-CM | POA: Diagnosis not present

## 2022-06-07 DIAGNOSIS — E11621 Type 2 diabetes mellitus with foot ulcer: Secondary | ICD-10-CM | POA: Diagnosis not present

## 2022-06-07 DIAGNOSIS — E1122 Type 2 diabetes mellitus with diabetic chronic kidney disease: Secondary | ICD-10-CM | POA: Diagnosis not present

## 2022-06-07 DIAGNOSIS — N183 Chronic kidney disease, stage 3 unspecified: Secondary | ICD-10-CM | POA: Diagnosis not present

## 2022-06-07 DIAGNOSIS — F3341 Major depressive disorder, recurrent, in partial remission: Secondary | ICD-10-CM | POA: Diagnosis not present

## 2022-06-07 DIAGNOSIS — G4733 Obstructive sleep apnea (adult) (pediatric): Secondary | ICD-10-CM | POA: Diagnosis not present

## 2022-06-07 DIAGNOSIS — Z9989 Dependence on other enabling machines and devices: Secondary | ICD-10-CM | POA: Diagnosis not present

## 2022-06-07 DIAGNOSIS — R82998 Other abnormal findings in urine: Secondary | ICD-10-CM | POA: Diagnosis not present

## 2022-06-07 DIAGNOSIS — E782 Mixed hyperlipidemia: Secondary | ICD-10-CM | POA: Diagnosis not present

## 2022-06-14 DIAGNOSIS — M170 Bilateral primary osteoarthritis of knee: Secondary | ICD-10-CM | POA: Diagnosis not present

## 2022-06-18 ENCOUNTER — Ambulatory Visit: Payer: PPO | Admitting: Podiatry

## 2022-06-18 DIAGNOSIS — L97521 Non-pressure chronic ulcer of other part of left foot limited to breakdown of skin: Secondary | ICD-10-CM | POA: Diagnosis not present

## 2022-06-18 DIAGNOSIS — E1149 Type 2 diabetes mellitus with other diabetic neurological complication: Secondary | ICD-10-CM | POA: Diagnosis not present

## 2022-06-21 DIAGNOSIS — M170 Bilateral primary osteoarthritis of knee: Secondary | ICD-10-CM | POA: Diagnosis not present

## 2022-06-22 DIAGNOSIS — N179 Acute kidney failure, unspecified: Secondary | ICD-10-CM | POA: Diagnosis not present

## 2022-06-23 NOTE — Progress Notes (Signed)
Subjective: No chief complaint on file.   72 year old female presents the office today for follow-up evaluation of wound on the left third toe.  She states is doing well and she states "you are going to release me today".  She has not seen any drainage or pus.  She has not had any fevers or chills.  She has no other concerns today.  Objective: AAO x3, NAD DP/PT pulses palpable bilaterally, CRT less than 3 seconds The distal aspect left third toes hyperkeratotic lesion.  Upon debridement there is no wound noted today.  There is no erythema or warmth.  There is no fluctuance or crepitation there is no malodor.  Chronic digital deformities present.  No other open lesions noted today. No pain with calf compression, swelling, warmth, erythema  Assessment: Healed wound left third toe  Plan: -All treatment options discussed with the patient including all alternatives, risks, complications.  -Debrided the hyperkeratotic lesion of the distal aspect left third toe with any complications or bleeding.  The patient wound is healed but she still needs to monitor.  Closely for any reoccurrence or any signs or symptoms of infection which she is well aware of.  Continue offloading.  Trula Slade DPM

## 2022-06-24 ENCOUNTER — Encounter: Payer: Self-pay | Admitting: Internal Medicine

## 2022-06-24 ENCOUNTER — Encounter: Payer: Self-pay | Admitting: Podiatry

## 2022-06-24 ENCOUNTER — Other Ambulatory Visit: Payer: Self-pay | Admitting: Podiatry

## 2022-06-24 MED ORDER — DOXYCYCLINE HYCLATE 100 MG PO TABS: 100.0000 mg | ORAL_TABLET | Freq: Two times a day (BID) | ORAL | 0 refills | Status: DC

## 2022-06-25 ENCOUNTER — Ambulatory Visit (INDEPENDENT_AMBULATORY_CARE_PROVIDER_SITE_OTHER): Payer: PPO | Admitting: Podiatry

## 2022-06-25 ENCOUNTER — Ambulatory Visit (INDEPENDENT_AMBULATORY_CARE_PROVIDER_SITE_OTHER): Payer: PPO

## 2022-06-25 DIAGNOSIS — L97521 Non-pressure chronic ulcer of other part of left foot limited to breakdown of skin: Secondary | ICD-10-CM

## 2022-06-25 DIAGNOSIS — M86172 Other acute osteomyelitis, left ankle and foot: Secondary | ICD-10-CM | POA: Diagnosis not present

## 2022-06-25 NOTE — Patient Instructions (Signed)
Pre-Operative Instructions  Congratulations, you have decided to take an important step to improving your quality of life.  You can be assured that the doctors of [facility] will be with you every step of the way.  Plan to be at the surgery center/hospital at least 1 (one) hour prior to your scheduled time unless otherwise directed by the surgical center/hospital staff.  You must have a responsible adult accompany you, remain during the surgery and drive you home.  Make sure you have directions to the surgical center/hospital and know how to get there on time. For hospital based surgery you will need to obtain a history and physical form from your family physician within 1 month prior to the date of surgery- we will give you a form for you primary physician.  We make every effort to accommodate the date you request for surgery.  There are however, times where surgery dates or times have to be moved.  We will contact you as soon as possible if a change in schedule is required.   No Aspirin/Ibuprofen for one week before surgery.  If you are on aspirin, any non-steroidal anti-inflammatory medications (Mobic, Aleve, Ibuprofen) you should stop taking it 7 days prior to your surgery.  You make take Tylenol  For pain prior to surgery.  Medications- If you are taking daily heart and blood pressure medications, seizure, reflux, allergy, asthma, anxiety, pain or diabetes medications, make sure the surgery center/hospital is aware before the day of surgery so they may notify you which medications to take or avoid the day of surgery. No food or drink after midnight the night before surgery unless directed otherwise by surgical center/hospital staff. No alcoholic beverages 24 hours prior to surgery.  No smoking 24 hours prior to or 24 hours after surgery. Wear loose pants or shorts- loose enough to fit over bandages, boots, and casts. No slip on shoes, sneakers are best. Bring your boot with you to the  surgery center/hospital.  Also bring crutches or a walker if your physician has prescribed it for you.  If you do not have this equipment, it will be provided for you after surgery. If you have not been contracted by the surgery center/hospital by the day before your surgery, call to confirm the date and time of your surgery. Leave-time from work may vary depending on the type of surgery you have.  Appropriate arrangements should be made prior to surgery with your employer. Prescriptions will be provided immediately following surgery by your doctor.  Have these filled as soon as possible after surgery and take the medication as directed. Remove nail polish on the operative foot. Wash the night before surgery.  The night before surgery wash the foot and leg well with the antibacterial soap provided and water paying special attention to beneath the toenails and in between the toes.  Rinse thoroughly with water and dry well with a towel.  Perform this wash unless told not to do so by your physician.  Enclosed: 1 Ice pack (please put in freezer the night before surgery)   1 Hibiclens skin cleaner   Pre-op Instructions  If you have any questions regarding the instructions, do not hesitate to call our office at any point during this process.   [place]: [address] [place], [place] [address] [phone]  [place]: [address] [place], [place] [address] [phone]  Dr. Izora Benn, DPM  

## 2022-06-25 NOTE — Progress Notes (Unsigned)
Subjective: Chief Complaint  Patient presents with   Callouses    Left  Toe ulcer, 6wks for nails/callus   72 year old female presents the office today for an acute appointment given increased swelling, ulcer to the left third toe which started over the weekend.  She has had some increased chills compared to her baseline but no fevers.  She has had swelling to the toe but no red streaks.  She states that over the weekend the toe opened up.  No injuries that she reports  Sugar was 162 this morning last A1c shootings was 6.2.  Her sugars been a little bit elevated recently given her knee as well as diverticulitis.  Objective: AAO x3, NAD DP/PT pulses palpable bilaterally, CRT less than 3 seconds There is edema and erythema present in the left third toe and there is macerated, granulation tissue present there is no exposed bone or tendon.  There is no ascending cellulitis.  No fluctuance or crepitation but there is no malodor. No pain with calf compression, swelling, warmth, erythema  Assessment: Ulceration, osteomyelitis left third toe  Plan: -All treatment options discussed with the patient including all alternatives, risks, complications.  -X-rays were obtained reviewed. There is cortical destruction suggestive of osteomyelitis of the distal phalanx of the third toe.  No soft tissue edema. -Given the recurrence and increased infection to the distal portion of toe I recommend to amputation partial versus total.  Discussed right-sided great toe as well but after discussion she agrees to proceed with amputation. -The incision placement as well as the postoperative course was discussed with the patient. I discussed risks of the surgery which include, but not limited to, infection, bleeding, pain, swelling, need for further surgery, delayed or nonhealing, painful or ugly scar, numbness or sensation changes, recurrence, transfer lesions, further deformity, DVT/PE, loss of toe/foot. Patient  understands these risks and wishes to proceed with surgery. The surgical consent was reviewed with the patient all 3 pages were signed. No promises or guarantees were given to the outcome of the procedure. All questions were answered to the best of my ability. Before the surgery the patient was encouraged to call the office if there is any further questions. The surgery will be performed at the [facility] on an outpatient basis. -Patient encouraged to call the office with any questions, concerns, change in symptoms.   Trula Slade DPM

## 2022-06-25 NOTE — Telephone Encounter (Signed)
Dawn, can you get her in today? Thanks!

## 2022-06-25 NOTE — Telephone Encounter (Signed)
Pt has been scheduled to come in today at 145.

## 2022-06-26 ENCOUNTER — Telehealth: Payer: Self-pay | Admitting: Urology

## 2022-06-26 DIAGNOSIS — H903 Sensorineural hearing loss, bilateral: Secondary | ICD-10-CM | POA: Diagnosis not present

## 2022-06-26 DIAGNOSIS — J342 Deviated nasal septum: Secondary | ICD-10-CM | POA: Diagnosis not present

## 2022-06-26 DIAGNOSIS — J310 Chronic rhinitis: Secondary | ICD-10-CM | POA: Diagnosis not present

## 2022-06-26 DIAGNOSIS — J343 Hypertrophy of nasal turbinates: Secondary | ICD-10-CM | POA: Diagnosis not present

## 2022-06-26 NOTE — Telephone Encounter (Signed)
DOS - 06/27/22  AMPUTATION 3RD LEFT --- [id number]  [facility]   RECEIVED FAX FROM [facility] STATING THAT CPT CODE 09811 HAS BEEN APPROVED, AUTH # [id number], GOOD FROM 06/27/22 - 09/25/22.

## 2022-06-27 ENCOUNTER — Encounter: Payer: Self-pay | Admitting: Podiatry

## 2022-06-27 DIAGNOSIS — M86172 Other acute osteomyelitis, left ankle and foot: Secondary | ICD-10-CM | POA: Diagnosis not present

## 2022-06-27 DIAGNOSIS — L089 Local infection of the skin and subcutaneous tissue, unspecified: Secondary | ICD-10-CM | POA: Diagnosis not present

## 2022-06-28 ENCOUNTER — Encounter: Payer: Self-pay | Admitting: Internal Medicine

## 2022-06-28 DIAGNOSIS — E1159 Type 2 diabetes mellitus with other circulatory complications: Secondary | ICD-10-CM

## 2022-06-28 DIAGNOSIS — M170 Bilateral primary osteoarthritis of knee: Secondary | ICD-10-CM | POA: Diagnosis not present

## 2022-06-28 MED ORDER — INSULIN PEN NEEDLE 32G X 5 MM MISC: 3 refills | Status: DC

## 2022-07-02 ENCOUNTER — Ambulatory Visit (INDEPENDENT_AMBULATORY_CARE_PROVIDER_SITE_OTHER): Payer: PPO

## 2022-07-02 ENCOUNTER — Ambulatory Visit: Payer: PPO

## 2022-07-02 DIAGNOSIS — L97521 Non-pressure chronic ulcer of other part of left foot limited to breakdown of skin: Secondary | ICD-10-CM

## 2022-07-02 NOTE — Progress Notes (Signed)
  Patient is in office today for first post op visit Left foot 3rd toe amputation done by Dr Jacqualyn Posey She presents in the office wearing a surgical shoe  Pt denies any fever chills nausea vomiting at this time. She stated that the first days she had a quite bit of pain but has gotten better,   She has no pain or tightness in the calf.   Xray's were obtained and reviewed by the Dr.   All questions and concerns were answered and discussed at this time.

## 2022-07-04 ENCOUNTER — Encounter: Payer: Self-pay | Admitting: Podiatry

## 2022-07-09 ENCOUNTER — Encounter: Payer: Self-pay | Admitting: Internal Medicine

## 2022-07-09 ENCOUNTER — Other Ambulatory Visit: Payer: Self-pay | Admitting: Internal Medicine

## 2022-07-09 DIAGNOSIS — E1159 Type 2 diabetes mellitus with other circulatory complications: Secondary | ICD-10-CM

## 2022-07-11 DIAGNOSIS — L400 Psoriasis vulgaris: Secondary | ICD-10-CM | POA: Diagnosis not present

## 2022-07-12 ENCOUNTER — Encounter: Payer: Self-pay | Admitting: Podiatry

## 2022-07-12 ENCOUNTER — Ambulatory Visit: Payer: PPO

## 2022-07-19 ENCOUNTER — Ambulatory Visit (INDEPENDENT_AMBULATORY_CARE_PROVIDER_SITE_OTHER): Payer: PPO | Admitting: Podiatry

## 2022-07-19 DIAGNOSIS — M86172 Other acute osteomyelitis, left ankle and foot: Secondary | ICD-10-CM

## 2022-07-19 DIAGNOSIS — L97521 Non-pressure chronic ulcer of other part of left foot limited to breakdown of skin: Secondary | ICD-10-CM

## 2022-07-22 NOTE — Progress Notes (Signed)
Subjective: Chief Complaint  Patient presents with   Routine Post Op    POV # 2 DOS 06/27/22 --- LEFT 3RD TOE AMPUTATION   72 year old female presents the office with above concerns.  She states that she is feeling well.  She is manage surgical shoe.  She is did not report any increase in swelling or redness.  She presents today for possible suture movable.  No fevers or chills.  No other concerns.  Objective: AAO x3, NAD DP/PT pulses palpable bilaterally, CRT less than 3 seconds Status post left third toe amputation with sutures intact.  Trace edema.  No erythema or warmth.  No ascending cellulitis.  Incision appears to be healing well and well coapted without any evidence of dehiscence.  No pain. No pain with calf compression, swelling, warmth, erythema  Assessment: Status post left third toe potation, healing well  Plan: -All treatment options discussed with the patient including all alternatives, risks, complications.  -Sutures removed today without any complications.  Incisions healing well.  Steri-Strips applied for reinforcement.  Antibiotic ointment and a bandage applied.  She can wash it with soap and water and apply a similar bandage. -Surgical shoe -Elevation -Still try to limit the amount of activity to facilitate healing -Patient encouraged to call the office with any questions, concerns, change in symptoms.   Trula Slade DPM

## 2022-07-26 ENCOUNTER — Encounter: Payer: PPO | Admitting: Podiatry

## 2022-07-27 DIAGNOSIS — J342 Deviated nasal septum: Secondary | ICD-10-CM | POA: Diagnosis not present

## 2022-07-27 DIAGNOSIS — H6982 Other specified disorders of Eustachian tube, left ear: Secondary | ICD-10-CM | POA: Diagnosis not present

## 2022-07-27 DIAGNOSIS — H6522 Chronic serous otitis media, left ear: Secondary | ICD-10-CM | POA: Diagnosis not present

## 2022-07-27 DIAGNOSIS — H9012 Conductive hearing loss, unilateral, left ear, with unrestricted hearing on the contralateral side: Secondary | ICD-10-CM | POA: Diagnosis not present

## 2022-07-27 DIAGNOSIS — J343 Hypertrophy of nasal turbinates: Secondary | ICD-10-CM | POA: Diagnosis not present

## 2022-07-27 DIAGNOSIS — J310 Chronic rhinitis: Secondary | ICD-10-CM | POA: Diagnosis not present

## 2022-07-30 ENCOUNTER — Ambulatory Visit: Payer: PPO | Admitting: Podiatry

## 2022-07-30 DIAGNOSIS — H6522 Chronic serous otitis media, left ear: Secondary | ICD-10-CM | POA: Diagnosis not present

## 2022-07-30 DIAGNOSIS — H6982 Other specified disorders of Eustachian tube, left ear: Secondary | ICD-10-CM | POA: Diagnosis not present

## 2022-07-30 DIAGNOSIS — H9012 Conductive hearing loss, unilateral, left ear, with unrestricted hearing on the contralateral side: Secondary | ICD-10-CM | POA: Diagnosis not present

## 2022-08-06 ENCOUNTER — Ambulatory Visit (INDEPENDENT_AMBULATORY_CARE_PROVIDER_SITE_OTHER): Payer: PPO

## 2022-08-06 ENCOUNTER — Ambulatory Visit (INDEPENDENT_AMBULATORY_CARE_PROVIDER_SITE_OTHER): Payer: PPO | Admitting: Podiatry

## 2022-08-06 DIAGNOSIS — Z9889 Other specified postprocedural states: Secondary | ICD-10-CM | POA: Diagnosis not present

## 2022-08-06 DIAGNOSIS — M2042 Other hammer toe(s) (acquired), left foot: Secondary | ICD-10-CM | POA: Diagnosis not present

## 2022-08-06 DIAGNOSIS — L84 Corns and callosities: Secondary | ICD-10-CM | POA: Diagnosis not present

## 2022-08-09 DIAGNOSIS — M170 Bilateral primary osteoarthritis of knee: Secondary | ICD-10-CM | POA: Diagnosis not present

## 2022-08-10 ENCOUNTER — Encounter: Payer: Self-pay | Admitting: Internal Medicine

## 2022-08-11 NOTE — Progress Notes (Signed)
Subjective: Chief Complaint  Patient presents with   Post-op Follow-up    POV # 3 DOS 06/27/22 --- LEFT 3RD TOE AMPUTATION, PATIENT STATED SHE IS DOING VERY WELL, NO PAIN     72 year old female presents the office with above concerns.  States that she is doing well and she has not had any openings, drainage or any new sores.  She has no pain although she does have neuropathy.  No fevers or chills.   Objective: AAO x3, NAD DP/PT pulses palpable bilaterally, CRT less than 3 seconds Status post left third toe amputation with eschar formed.  There is no edema no erythema.  Adductovarus present fifth toe with thick hyperkeratotic tissue noted on the plantar aspect.  There is no underlying ulceration drainage or signs of infection.  No open lesions.  No pain with calf compression, swelling, warmth, erythema  Assessment: Status post left third toe amputation, healing well; pre-ulcerative callus left 5th toe  Plan: -All treatment options discussed with the patient including all alternatives, risks, complications.  -X-rays obtained reviewed.  3 views of the foot were obtained.  No evidence of acute fracture or osteomyelitis.  Multiple digital amputations noted. -Incisions well-healed until is healed from amputation.  Debrided of the left fifth toenail as well as some of the hyperkeratotic tissue without any complications or bleeding.  She is to monitor this area very closely for any skin breakdown or signs of infection. -Daily foot inspection  Return in about 2 months (around 10/06/2022).  Vivi Barrack DPM

## 2022-08-15 ENCOUNTER — Encounter: Payer: Self-pay | Admitting: Internal Medicine

## 2022-08-29 DIAGNOSIS — H6982 Other specified disorders of Eustachian tube, left ear: Secondary | ICD-10-CM | POA: Diagnosis not present

## 2022-08-29 DIAGNOSIS — H903 Sensorineural hearing loss, bilateral: Secondary | ICD-10-CM | POA: Diagnosis not present

## 2022-08-29 DIAGNOSIS — H7202 Central perforation of tympanic membrane, left ear: Secondary | ICD-10-CM | POA: Diagnosis not present

## 2022-09-04 ENCOUNTER — Encounter: Payer: Self-pay | Admitting: Internal Medicine

## 2022-09-04 ENCOUNTER — Ambulatory Visit (INDEPENDENT_AMBULATORY_CARE_PROVIDER_SITE_OTHER): Payer: PPO | Admitting: Internal Medicine

## 2022-09-04 VITALS — BP 138/60 | HR 73 | Ht 63.0 in | Wt 240.0 lb

## 2022-09-04 DIAGNOSIS — Z7984 Long term (current) use of oral hypoglycemic drugs: Secondary | ICD-10-CM | POA: Diagnosis not present

## 2022-09-04 DIAGNOSIS — E785 Hyperlipidemia, unspecified: Secondary | ICD-10-CM

## 2022-09-04 DIAGNOSIS — Z794 Long term (current) use of insulin: Secondary | ICD-10-CM

## 2022-09-04 DIAGNOSIS — E669 Obesity, unspecified: Secondary | ICD-10-CM

## 2022-09-04 DIAGNOSIS — E1165 Type 2 diabetes mellitus with hyperglycemia: Secondary | ICD-10-CM | POA: Diagnosis not present

## 2022-09-04 DIAGNOSIS — E1159 Type 2 diabetes mellitus with other circulatory complications: Secondary | ICD-10-CM

## 2022-09-04 LAB — POCT GLYCOSYLATED HEMOGLOBIN (HGB A1C): Hemoglobin A1C: 7.1 % — AB (ref 4.0–5.6)

## 2022-09-04 MED ORDER — TRESIBA FLEXTOUCH 200 UNIT/ML ~~LOC~~ SOPN: PEN_INJECTOR | SUBCUTANEOUS | 3 refills | Status: DC

## 2022-09-04 NOTE — Progress Notes (Signed)
Patient ID: [name], female   DOB: 10-28-1950, 72 y.o.   MRN: [id number]   HPI: [name] is a 72 y.o.-year-old female, initially referred by her PCP, Dr. Clelia Croft, returning for f/u for DM2, dx in 1980s, insulin-dependent, controlled, with complications (PN - s/p B toe amputations 2/2 osteomyelitis, PAD, foot ulcer). She saw Dr. Leslie Dales previously. Last visit with me 4 months ago.  Interim history: No increased urination, blurry vision, nausea, chest pain. She had tubes placed in her ear since last OV. She is getting gel injections in knees. She had the flu 06/2022. She had her L 3rd toe amputated since last OV 06/2022. The site is now better.  Reviewed her HbA1c levels: Lab Results  Component Value Date   HGBA1C 6.8 (A) 05/04/2022   HGBA1C 7.0 (A) 01/01/2022   HGBA1C 7.6 (A) 08/30/2021   HGBA1C 8.5 (A) 05/31/2021   HGBA1C 8.5 (A) 02/21/2021   HGBA1C 7.8 (H) 12/14/2020   HGBA1C 8.4 (A) 09/30/2020   HGBA1C 8.1 (A) 06/30/2020   HGBA1C 9.4 (A) 04/22/2020   HGBA1C 9.1 (A) 01/20/2020   HGBA1C 10.3 (A) 09/29/2019   HGBA1C 9.5 (A) 07/01/2019   HGBA1C 8.4 (A) 02/04/2019   HGBA1C 8.0 (A) 09/30/2018   HGBA1C 7.1 (A) 05/29/2018   HGBA1C 6.7 (A) 01/27/2018   HGBA1C 7.3 (A) 09/24/2017   HGBA1C 7.0 05/27/2017   HGBA1C 6.8 01/24/2017   HGBA1C 6.8 08/07/2016  09/30/2020: HbA1c calculated from fructosamine is much better, at 6.6%, correlating more with her blood sugars at home. 02/16/2016: 7.1% 06/2015: 6.6%  Pt is on a regimen of: - Jardiance 25 >> 10 mg daily in am - Glipizide 5 mg 2x a day before meals - Tresiba 20 >> ... 52 >> 46 >> 56 units daily  - Novolog 7 to 10 >> ... 12-16 (20) >> 14-16 units before each meal  She stopped Ozempic 04/2019 due to GI symptoms.  We retried it in 09/2019 with same symptoms so he had to stop. She stopped Metformin ER >> AP and diarrhea She was on Victoza >> rash. In 09/2016, she had a steroid inj in back >> sugars 200s >> used Novolog for  3 days.  She initially refused insulin.  Pt checks her sugars 1-2x a day: - am: 9 91-134 >> 87-155, 167, 209 >> 66, 69, 91-134 >> 98-183, 211 - 2h after b'fast: n/c >> 101-175 >> 96-141 >> n/c >> 115-175 >> 113-139 - before lunch: 70, 115-140, 276 >> n/c >> 70-150 >> 59, 127-146, 227 - 2h after lunch:  140-200 >> 132-145 >> 77 >> 94-150 >> 91 - before dinner: 30-137, 186 >> 130-150 >> 118-145, 160 >> 140-199 - 2h after dinner:  88-130, 189, 193 >> 110-156, 290- sick >> n/c - bedtime: 130-205 >> n/c >> 115-184, 222 (stress) >> 110-140 >> 145-177, 216 - nighttime: n/c Lowest sugar was 70 >> 77 >> 66 >> 59; she has hypoglycemia awareness in the 90s. Highest sugar was  420 (steroids) >> 276 >> 237 >> 290 x1 >> 227.  Glucometer: One Touch Verio  -+ CKD, last BUN/creatinine: 06/22/2022: 62/1.51, GFR 37, glucose 166 Lab Results  Component Value Date   BUN 55 (H) 06/13/2021   BUN 40 (H) 12/23/2020   CREATININE 1.55 (H) 06/13/2021   CREATININE 1.39 (H) 12/23/2020  11/21/2020: CMP normal, except glucose 106, BUN/creatinine 49/1.36, GFR 43 05/25/2020: CMP normal except: Glu 129, BUNs/creatinine 44/1.29, GFR 41, otherwise normal 12/31/2019: 42/1.37, GFR 38, glucose 193, otherwise  normal CMP 03/21/2018: CMP normal with the exception of high glucose at 143, BUN/creatinine 37/1.26, GFR 43 (kidney function improved from 09/2017) Urinalysis with 1+ protein 09/13/2017: Glucose 123, BUN 44/creatinine 1.34, GFR 40, previously 44 03/06/2017: ACR 222.8 08/29/2016: ACR 76.7 02/16/2016: 42/1.34 (GFR 40), Glu 213  12/26/2015: 25/1.23 (GFR 44), Glu 153 Restarted lisinopril.  -+ HL; last set of lipids: 06/07/2022: 90/212/28/29 11/21/2020: 117/204/35/49 05/25/2020: 106/204/33/40 12/31/2019: 102/147/33/43 Lab Results  Component Value Date   CHOL 110 02/04/2019   HDL 31.60 (L) 02/04/2019   LDLDIRECT 44.0 02/04/2019   TRIG 255.0 (H) 02/04/2019   CHOLHDL 3 02/04/2019  03/21/2018: 92/202/31/21 09/04/2017:  126/269/33/39 08/29/2016: 87/132/32/30 02/16/2016: 128/278/33/39  03/02/2014: 114/175/32/48 On Zocor 40.  - last eye exam was 05/10/2022: No DR (Dr. Nile Riggs).  She has a history of cataract surgery.  -She has numbness and tingling in her feet.  She sees podiatry -Dr. Ardelle Anton.  Latest foot exam was performed on 04/12/2022. She had amputation of 1/2 of the left hallux 05/06/2017.  She had osteomyelitis of the right second toe and she is status post left partial fourth toe amputation on 06/28/2021.  She also has a history of HTN, GERD, anemia. She started Humira (for psoriasis) but did not tolerate this >> stopped. She was in PT for her back pain. Also had dry needling.   ROS: + See HPI  I reviewed pt's medications, allergies, PMH, social hx, family hx, and changes were documented in the history of present illness. Otherwise, unchanged from my initial visit note.  Past Medical History:  Diagnosis Date   Allergy    seasonal   Anemia in chronic renal disease 06/05/2011   Anxiety    takes Ativan tid   Asthma    has an inhaler prn   Chronic kidney disease    Cough    DDD (degenerative disc disease), cervical 06/05/2011   DDD (degenerative disc disease), lumbosacral 06/05/2011   Depression    takes Cymbalta daily   Diabetes mellitus    takes Amaryl and Metformin daily   Diabetic neuropathy (HCC)    Diverticulosis    Dizziness    GERD (gastroesophageal reflux disease)    takes Protonix prn   Headache(784.0)    occasionally   History of bronchitis    last time 44yrs ago   History of colon polyps    History of MRSA infection    HTN (hypertension) 06/05/2011   takes Prinizide daily   Hyperlipidemia    takes Zocor daily   Insomnia    not on any meds at present time   Osteoarthritis    PONV (postoperative nausea and vomiting)    hard to wake up   Psoriasis 06/05/2011   on legs and uses cream prn   Seasonal allergies    but doesn't take any meds   Sleep apnea    uses CPAP    Tubular adenoma of colon    Past Surgical History:  Procedure Laterality Date   ABDOMINAL HYSTERECTOMY  1982 and 1984   AMPUTATION  02/12/2012   Procedure: AMPUTATION RAY;  Surgeon: Toni Arthurs, MD;  Location: [facility] OR;  Service: Orthopedics;  Laterality: Right;  RIGHT 2ND TOE AMPUTATION    ANTERIOR LAT LUMBAR FUSION N/A 12/22/2020   Procedure: Thoracic twelve to Lumbar one posterior lateral lumbar interbody fusion with pedicle screw fixation Thoracic ten to Lumbar two with robot, posterior arthrodesis with allograft from Thoracic ten to Lumbar two;  Surgeon: Barnett Abu, MD;  Location: [facility] OR;  Service: Neurosurgery;  Laterality: N/A;   APPLICATION OF ROBOTIC ASSISTANCE FOR SPINAL PROCEDURE N/A 12/22/2020   Procedure: APPLICATION OF ROBOTIC ASSISTANCE FOR SPINAL PROCEDURE;  Surgeon: Barnett Abu, MD;  Location: [facility] OR;  Service: Neurosurgery;  Laterality: N/A;  3C/RM 19   BACK SURGERY     x 3   CARDIAC CATHETERIZATION     08/09/04: 50% mid AV groove CX lesion, NL LM, LAD, Ramus, EF > 60% (Dr. Allyson Sabal)   CARPAL TUNNEL RELEASE Bilateral 2010   CERVICAL SPINE SURGERY     CHOLECYSTECTOMY  1977   COLONOSCOPY  05/30/2009, 09/30/13   [facility] 2011/Dr.Brodie 2015   COLONOSCOPY W/ POLYPECTOMY  06/23/2019   DILATION AND CURETTAGE OF UTERUS     ELBOW SURGERY     left d/t tendonitis   ESOPHAGOGASTRODUODENOSCOPY     LUMBAR WOUND DEBRIDEMENT N/A 07/15/2012   Procedure: I & D of Lumbar Wound: Possible Repair of CSF Leak;  Surgeon: Mariam Dollar, MD;  Location: [facility];  Service: Neurosurgery;  Laterality: N/A;  I & D of Lumbar Wound: Possible Repair of CSF Leak placement lumbar drain   POLYPECTOMY     right knee arthroscopy     x 2   SHOULDER ARTHROSCOPY Left    TOE AMPUTATION Bilateral    2nd toe   Social History   Social History   Marital status: Married    Spouse name: N/A   Number of children: 2   Occupational History   Disabled    Social History Main Topics   Smoking status: Never  Smoker   Smokeless tobacco: Never Used     Comment: never used tobacco   Alcohol use No   Drug use: No   Current Outpatient Medications on File Prior to Visit  Medication Sig Dispense Refill   albuterol (PROVENTIL HFA;VENTOLIN HFA) 108 (90 BASE) MCG/ACT inhaler Inhale 1 puff into the lungs every 4 (four) hours as needed for wheezing or shortness of breath.      aspirin 81 MG tablet Take 81 mg by mouth daily.     budesonide-formoterol (SYMBICORT) 160-4.5 MCG/ACT inhaler Inhale 1 puff into the lungs 2 (two) times daily as needed (shortness of breath).     Cholecalciferol (VITAMIN D) 50 MCG (2000 UT) tablet Take 2,000 Units by mouth daily.     clobetasol (OLUX) 0.05 % topical foam Apply 1 application topically daily.     COLLAGEN PO Take 2 capsules by mouth daily.     doxycycline (VIBRA-TABS) 100 MG tablet Take 1 tablet (100 mg total) by mouth 2 (two) times daily. 20 tablet 0   DULoxetine (CYMBALTA) 60 MG capsule SMARTSIG:1 By Mouth     glipiZIDE (GLUCOTROL) 5 MG tablet Take 1 tablet (5 mg total) by mouth 2 (two) times daily before a meal. 180 tablet 3   hydrocortisone cream 1 % Apply 1 application topically 2 (two) times daily as needed (rash).     insulin degludec (TRESIBA FLEXTOUCH) 200 UNIT/ML FlexTouch Pen ADMINISTER 52 UNITS UNDER THE SKIN DAILY 18 mL 3   Insulin Pen Needle 32G X 5 MM MISC Use up to 4 needles as directed to inject insulin 400 each 3   Insulin Syringe-Needle U-100 (INSULIN SYRINGE 1CC/31GX5/16") 31G X 5/16" 1 ML MISC Use 4 syringes per day to inject insulin 400 each 3   JARDIANCE 25 MG TABS tablet TAKE 1 TABLET(25 MG) BY MOUTH DAILY 90 tablet 3   Lancets (ONETOUCH ULTRASOFT) lancets      lisinopril-hydrochlorothiazide (  ZESTORETIC) 20-12.5 MG tablet Take 2 tablets by mouth daily.     LORazepam (ATIVAN) 1 MG tablet Take 1 mg by mouth 2 (two) times daily as needed for anxiety.     metoprolol succinate (TOPROL-XL) 100 MG 24 hr tablet Take 100 mg by mouth daily. Take with or  immediately following a meal.     metroNIDAZOLE (FLAGYL) 250 MG tablet Take 1 tablet (250 mg total) by mouth 3 (three) times daily. 21 tablet 0   metroNIDAZOLE (FLAGYL) 250 MG tablet Take 1 tablet (250 mg total) by mouth 3 (three) times daily. 30 tablet 0   Multiple Vitamins-Minerals (MULTIVITAMIN WITH MINERALS) tablet Take 1 tablet by mouth daily.      Needle, Disp, 32G X 5/16" MISC by Does not apply route.     NOVOLOG 100 UNIT/ML injection ADMINISTER 12 TO 20 UNITS UNDER THE SKIN THREE TIMES DAILY BEFORE MEALS 50 mL 2   ondansetron (ZOFRAN) 8 MG tablet Take 8 mg by mouth every 8 (eight) hours as needed for nausea or vomiting.     ONETOUCH VERIO test strip USE AS INSTRUCTED TO TEST TWICE DAILY 200 strip 1   pantoprazole (PROTONIX) 40 MG tablet Take 40 mg by mouth daily.     Probiotic Product (PROBIOTIC DAILY PO) Take 1 capsule by mouth daily.      simvastatin (ZOCOR) 40 MG tablet Take 40 mg by mouth daily in the afternoon.     SKYRIZI PEN 150 MG/ML SOAJ Inject into the skin.     triamcinolone (KENALOG) 0.025 % ointment Apply 1 application topically 2 (two) times daily. (Patient taking differently: Apply 1 application  topically 2 (two) times daily as needed (toe ulcer).) 30 g 0   No current facility-administered medications on file prior to visit.   Allergies  Allergen Reactions   Victoza [Liraglutide] Hives and Rash   Hydromorphone Hives, Diarrhea, Swelling and Rash   Ultram [Tramadol] Rash   Acetaminophen     Pt was told not to take   Betadine [Povidone Iodine] Other (See Comments)    Skin peels   Bydureon [Exenatide] Diarrhea and Nausea And Vomiting   Chlorhexidine     Exacerbates psoriasis    Codeine Other (See Comments)    Severe headaches   Contrast Media [Iodinated Contrast Media] Nausea And Vomiting   Fluoxetine Other (See Comments)   Humira [Adalimumab] Hives   Hydrocodone Other (See Comments)    headache   Invokana [Canagliflozin] Other (See Comments)    Cannot take  this 2/2 PAD and toe amputations   Iodine Hives   Januvia [Sitagliptin] Diarrhea   Morphine And Related Nausea And Vomiting   Olanzapine Other (See Comments)   Ozempic (0.25 Or 0.5 Mg-Dose) [Semaglutide(0.25 Or 0.5mg -Dos)] Nausea And Vomiting   Percocet [Oxycodone-Acetaminophen] Other (See Comments)    Headache    Prednisone Diarrhea and Nausea And Vomiting   Sulfa Antibiotics Itching   Valium Other (See Comments)    headache   Vortioxetine Other (See Comments)   Amoxicillin Hives and Rash    Did it involve swelling of the face/tongue/throat, SOB, or low BP? Unknown Did it involve sudden or severe rash/hives, skin peeling, or any reaction on the inside of your mouth or nose? Yes Did you need to seek medical attention at a hospital or doctor's office? Yes When did it last happen?      unk If all above answers are "NO", may proceed with cephalosporin use.    Darvocet [Propoxyphene N-Acetaminophen] Rash and  Other (See Comments)    headache   Fentanyl And Related Rash   Wellbutrin [Bupropion Hcl] Hives and Rash   Family History  Problem Relation Age of Onset   Hypertension Mother        died from infection s/p hip surgery   Heart attack Father    Colon polyps Brother    Colon cancer Brother 80   Colon cancer Maternal Grandfather    Stomach cancer Neg Hx    Esophageal cancer Neg Hx    Rectal cancer Neg Hx    Liver cancer Neg Hx    PE: BP 138/60 (BP Location: Right Arm, Patient Position: Sitting, Cuff Size: Normal)   Pulse 73   Ht 5\' 3"  (1.6 m)   Wt 270 lb (122.5 kg)   SpO2 99%   BMI 47.83 kg/m  Wt Readings from Last 3 Encounters:  09/04/22 270 lb (122.5 kg)  05/04/22 240 lb 9.6 oz (109.1 kg)  01/01/22 233 lb 9.6 oz (106 kg)   Constitutional: overweight, in NAD, walks with a cane Eyes: no exophthalmos ENT: no masses palpated in neck, no cervical lymphadenopathy Cardiovascular: RRR, No MRG Respiratory: CTA B Musculoskeletal: no deformities Skin: no  rashes Neurological: no tremor with outstretched hands  ASSESSMENT: 1. DM2, insulin-dependent, controlled, with complications: - PN - s/p B 2nd toe amputations 2/2 osteomyelitis - 2013 and 2015; 1/2 hallux 04/2017 - PAD -we cannot do the Invokana for her  - foot ulcer  2. HL  3.  Obesity class II  PLAN:  1. Patient with longstanding, uncontrolled, type 2 diabetes, on sulfonylurea, SGLT2 inhibitor and basal/bolus insulin regimen, with improved control-HbA1c at last check 6.8%. -At last visit, sugars were mostly at goal throughout the day with some low values in the morning so we decreased her Tresiba dose but continued to the rest of the regimen especially as she was planning to return to the previous diet after the holidays. -At today's visit, sugars appear to be mostly at goal, but with hyperglycemic exceptions occasionally, in the 200s later in the day.  She had several health events happening since last visit, including flu, antibiotics for ear infection and for foot ulcer and finally amputation of one of her toes.  Sugars have been more fluctuating, but in the last 10 days they have been mostly at goal in the morning.  She was not checking later in the day during this interval.  I did advise her to try to check more at bedtime, but I also suggested a CGM.  I advised her to check with her insurance to see which suppliers can use. - I suggested to:  Patient Instructions  Please continue: - Jardiance 10 mg daily before b'fast - Glipizide 5 mg 2x a day before meals - Novolog 14-16 units 15 min before a meal 3x a day (up to 20 units with steroids) - Tresiba 56 units at bedtime   I will recommend a CGM (Continuous Glucose Monitor)- see which supplier we can use: The most common suppliers for the CGM are:  [employer] [employer]: [phone] [id number] [facility] Healthcare: [phone] Ext [phone] [employer]: [phone] [employer]: [phone] [facility] Services: [phone] [employer] Supplies: [phone]   Please come back for a follow-up appointment in 4 months.  - we checked her HbA1c: 7.1% (higher) - advised to check sugars at different times of the day - 4x a day, rotating check times - advised for yearly eye exams >> she is UTD - return to  clinic in 3-4 months  2. HL - Reviewed latest lipid panel from 06/2022: Fractions at goal with the exception of high triglycerides - Continues the statin (Zocor 40 mg daily) without side effects.  3.  Obesity class II -She continues Jardiance which should also help with weight loss; her dose was decreased since last visit possibly due to the decreased GFR -Unfortunately she could not tolerate Ozempic due to diverticulitis and abdominal pain  -She was previously going to the weight management clinic-was on a 1400-calorie/day diet.  At last visit she was off the diet but was planning to restart after the holidays. -At today's visit, her weight is higher by 30 pounds.  She is working on her diet now.  I am hoping that we can reduce her insulin and hopefully stop glipizide at next visit.  Carlus Pavlov, MD PhD [facility] Endocrinology

## 2022-09-04 NOTE — Patient Instructions (Addendum)
Please continue: - Jardiance 10 mg daily before b'fast - Glipizide 5 mg 2x a day before meals - Novolog 14-16 units 15 min before a meal 3x a day (up to 20 units with steroids) - Tresiba 56 units at bedtime   I will recommend a CGM (Continuous Glucose Monitor)- see which supplier we can use: The most common suppliers for the CGM are:  [place] [employer]: [phone] [facility] Healthcare: [phone] Ext [phone] [employer]: [phone] [employer]: [phone] [facility] Services: [phone] [facility] [employer]: [phone]   Please come back for a follow-up appointment in 4 months.

## 2022-09-07 ENCOUNTER — Encounter: Payer: Self-pay | Admitting: Internal Medicine

## 2022-09-07 NOTE — Telephone Encounter (Signed)
Weight has been updated in your chart.   Have a [address] Weekend  [place]

## 2022-09-25 DIAGNOSIS — G4733 Obstructive sleep apnea (adult) (pediatric): Secondary | ICD-10-CM | POA: Diagnosis not present

## 2022-09-27 ENCOUNTER — Other Ambulatory Visit: Payer: Self-pay | Admitting: Internal Medicine

## 2022-10-03 ENCOUNTER — Encounter: Payer: Self-pay | Admitting: Internal Medicine

## 2022-10-08 ENCOUNTER — Ambulatory Visit (INDEPENDENT_AMBULATORY_CARE_PROVIDER_SITE_OTHER): Payer: PPO | Admitting: Podiatry

## 2022-10-08 DIAGNOSIS — M79675 Pain in left toe(s): Secondary | ICD-10-CM | POA: Diagnosis not present

## 2022-10-08 DIAGNOSIS — M79674 Pain in right toe(s): Secondary | ICD-10-CM

## 2022-10-08 DIAGNOSIS — B351 Tinea unguium: Secondary | ICD-10-CM

## 2022-10-08 DIAGNOSIS — E1149 Type 2 diabetes mellitus with other diabetic neurological complication: Secondary | ICD-10-CM

## 2022-10-10 NOTE — Progress Notes (Signed)
Subjective: Chief Complaint  Patient presents with   Nail Problem    Nail trim     72 year old female presents the office with above concerns.  She has no new concerns today.  She presents today for diabetic foot evaluation and nail trim.  No open lesions.  Objective: AAO x3, NAD DP/PT pulses palpable bilaterally, CRT less than 3 seconds Status post multiple toe amputation of the left foot which are all well-healed.  The left fifth, right first, third, fourth, fifth nails are hypertrophic, dystrophic and elongated causing discomfort as a rub.  No swelling, redness or drainage.  No open lesions. Minimal callus left fifth toe.  Dry skin present bilaterally. No pain with calf compression, swelling, warmth, erythema  Assessment: Status post left third toe amputation, healing well; pre-ulcerative callus left 5th toe  Plan: -All treatment options discussed with the patient including all alternatives, risks, complications.  -Sharply debrided nails x 5 without any complications or bleeding -Discussed daily foot inspection, glucose control  Return in about 9 weeks (around 12/10/2022) for routine care.  Vivi Barrack DPM

## 2022-10-11 DIAGNOSIS — Z79899 Other long term (current) drug therapy: Secondary | ICD-10-CM | POA: Diagnosis not present

## 2022-10-11 DIAGNOSIS — L400 Psoriasis vulgaris: Secondary | ICD-10-CM | POA: Diagnosis not present

## 2022-10-25 DIAGNOSIS — G4733 Obstructive sleep apnea (adult) (pediatric): Secondary | ICD-10-CM | POA: Diagnosis not present

## 2022-10-29 DIAGNOSIS — Z111 Encounter for screening for respiratory tuberculosis: Secondary | ICD-10-CM | POA: Diagnosis not present

## 2022-11-01 ENCOUNTER — Encounter: Payer: Self-pay | Admitting: Internal Medicine

## 2022-11-02 MED ORDER — TRESIBA FLEXTOUCH 200 UNIT/ML ~~LOC~~ SOPN: PEN_INJECTOR | SUBCUTANEOUS | 3 refills | Status: DC

## 2022-11-27 DIAGNOSIS — G4733 Obstructive sleep apnea (adult) (pediatric): Secondary | ICD-10-CM | POA: Diagnosis not present

## 2022-12-10 ENCOUNTER — Ambulatory Visit: Payer: PPO | Admitting: Podiatry

## 2022-12-27 DIAGNOSIS — G4733 Obstructive sleep apnea (adult) (pediatric): Secondary | ICD-10-CM | POA: Diagnosis not present

## 2022-12-31 ENCOUNTER — Ambulatory Visit: Payer: PPO | Admitting: Podiatry

## 2022-12-31 DIAGNOSIS — M79674 Pain in right toe(s): Secondary | ICD-10-CM

## 2022-12-31 DIAGNOSIS — B351 Tinea unguium: Secondary | ICD-10-CM

## 2022-12-31 DIAGNOSIS — E1149 Type 2 diabetes mellitus with other diabetic neurological complication: Secondary | ICD-10-CM

## 2022-12-31 DIAGNOSIS — M79675 Pain in left toe(s): Secondary | ICD-10-CM | POA: Diagnosis not present

## 2022-12-31 NOTE — Progress Notes (Signed)
Subjective: Chief Complaint  Patient presents with   Nail Problem     72 year old female presents the office with above concerns.  She has no new concerns today.  She presents today for diabetic foot evaluation and nail trim.  No open lesions.  Objective: AAO x3, NAD DP/PT pulses palpable bilaterally, CRT less than 3 seconds Status post multiple toe amputation of the left foot which are all well-healed.  The left fifth, right first, third, fourth, fifth nails are hypertrophic, dystrophic and elongated causing discomfort as a rub.  No swelling, redness or drainage.  No open lesions. Minimal callus left fifth toe.  Dry skin present bilaterally. No pain with calf compression, swelling, warmth, erythema  Assessment: Status post left third toe amputation, healing well; pre-ulcerative callus left 5th toe  Plan: -All treatment options discussed with the patient including all alternatives, risks, complications.  -Sharply debrided nails x 5 without any complications or bleeding -Discussed daily foot inspection, glucose control  Return in about 9 weeks (around 03/04/2023).  Vivi Barrack DPM

## 2023-01-08 DIAGNOSIS — I129 Hypertensive chronic kidney disease with stage 1 through stage 4 chronic kidney disease, or unspecified chronic kidney disease: Secondary | ICD-10-CM | POA: Diagnosis not present

## 2023-01-08 DIAGNOSIS — N183 Chronic kidney disease, stage 3 unspecified: Secondary | ICD-10-CM | POA: Diagnosis not present

## 2023-01-08 DIAGNOSIS — F339 Major depressive disorder, recurrent, unspecified: Secondary | ICD-10-CM | POA: Diagnosis not present

## 2023-01-08 DIAGNOSIS — E782 Mixed hyperlipidemia: Secondary | ICD-10-CM | POA: Diagnosis not present

## 2023-01-08 DIAGNOSIS — Z794 Long term (current) use of insulin: Secondary | ICD-10-CM | POA: Diagnosis not present

## 2023-01-08 DIAGNOSIS — Z6841 Body Mass Index (BMI) 40.0 and over, adult: Secondary | ICD-10-CM | POA: Diagnosis not present

## 2023-01-08 DIAGNOSIS — E1122 Type 2 diabetes mellitus with diabetic chronic kidney disease: Secondary | ICD-10-CM | POA: Diagnosis not present

## 2023-01-09 ENCOUNTER — Ambulatory Visit: Payer: PPO | Admitting: Internal Medicine

## 2023-01-09 ENCOUNTER — Encounter: Payer: Self-pay | Admitting: Internal Medicine

## 2023-01-09 VITALS — BP 140/60 | HR 66 | Ht 63.0 in | Wt 243.8 lb

## 2023-01-09 DIAGNOSIS — E1165 Type 2 diabetes mellitus with hyperglycemia: Secondary | ICD-10-CM | POA: Diagnosis not present

## 2023-01-09 DIAGNOSIS — E1159 Type 2 diabetes mellitus with other circulatory complications: Secondary | ICD-10-CM | POA: Diagnosis not present

## 2023-01-09 DIAGNOSIS — E669 Obesity, unspecified: Secondary | ICD-10-CM | POA: Diagnosis not present

## 2023-01-09 DIAGNOSIS — Z794 Long term (current) use of insulin: Secondary | ICD-10-CM

## 2023-01-09 DIAGNOSIS — E785 Hyperlipidemia, unspecified: Secondary | ICD-10-CM

## 2023-01-09 DIAGNOSIS — Z7984 Long term (current) use of oral hypoglycemic drugs: Secondary | ICD-10-CM

## 2023-01-09 DIAGNOSIS — Z23 Encounter for immunization: Secondary | ICD-10-CM | POA: Diagnosis not present

## 2023-01-09 LAB — POCT GLYCOSYLATED HEMOGLOBIN (HGB A1C): Hemoglobin A1C: 7.5 % — AB (ref 4.0–5.6)

## 2023-01-09 MED ORDER — FREESTYLE LIBRE 3 READER DEVI: 1.0000 | Freq: Once | 0 refills | Status: AC

## 2023-01-09 MED ORDER — TIRZEPATIDE 2.5 MG/0.5ML ~~LOC~~ SOAJ: 2.5000 mg | SUBCUTANEOUS | 3 refills | Status: DC

## 2023-01-09 MED ORDER — FREESTYLE LIBRE 3 SENSOR MISC: 1.0000 | 3 refills | Status: DC

## 2023-01-09 NOTE — Patient Instructions (Addendum)
Please continue: - Jardiance 10 mg daily before b'fast - Glipizide 5 mg before dinner (try to stop and see if sugars change) - Novolog 14-16 units 15 min before a meal 3x a day (up to 20 units with steroids) - Evaristo Bury 56 units at bedtime   Try to start: - Mounjaro 2.5 mg weekly  If Mounjaro is not covered, then increase [place] to 60 units.  Please come back for a follow-up appointment in 4 months.

## 2023-01-09 NOTE — Progress Notes (Signed)
Patient ID: [name], female   DOB: 1950/08/22, 72 y.o.   MRN: [id number]   HPI: [name] is a 72 y.o.-year-old female, initially referred by her PCP, Dr. Clelia Croft, returning for f/u for DM2, dx in 1980s, insulin-dependent, controlled, with complications (PN - s/p B toe amputations 2/2 osteomyelitis, PAD, foot ulcer). She saw Dr. Leslie Dales previously. Last visit with me 4 months ago.  Interim history: No increased urination, blurry vision, nausea, chest pain. She was not able to obtain the CGM since last visit as she was told by her insurance that she needed to have a smart phone for this.  Reviewed her HbA1c levels: Lab Results  Component Value Date   HGBA1C 7.1 (A) 09/04/2022   HGBA1C 6.8 (A) 05/04/2022   HGBA1C 7.0 (A) 01/01/2022   HGBA1C 7.6 (A) 08/30/2021   HGBA1C 8.5 (A) 05/31/2021   HGBA1C 8.5 (A) 02/21/2021   HGBA1C 7.8 (H) 12/14/2020   HGBA1C 8.4 (A) 09/30/2020   HGBA1C 8.1 (A) 06/30/2020   HGBA1C 9.4 (A) 04/22/2020   HGBA1C 9.1 (A) 01/20/2020   HGBA1C 10.3 (A) 09/29/2019   HGBA1C 9.5 (A) 07/01/2019   HGBA1C 8.4 (A) 02/04/2019   HGBA1C 8.0 (A) 09/30/2018   HGBA1C 7.1 (A) 05/29/2018   HGBA1C 6.7 (A) 01/27/2018   HGBA1C 7.3 (A) 09/24/2017   HGBA1C 7.0 05/27/2017   HGBA1C 6.8 01/24/2017  09/30/2020: HbA1c calculated from fructosamine is much better, at 6.6%, correlating more with her blood sugars at home. 02/16/2016: 7.1% 06/2015: 6.6%  Pt is on a regimen of: - Jardiance 25 >> 10 mg daily in am - Glipizide 5 mg 2x a day before meals >> she decreased to 5 mg before dinner - Tresiba 20 >> ... 52 >> 46 >> 56 units daily  - Novolog 7 to 10 >> ... 12-16 (20) >> 14-16 units before each meal  She stopped Ozempic 04/2019 due to GI symptoms.  We retried it in 09/2019 with same symptoms so he had to stop. She stopped Metformin ER >> AP and diarrhea She was on Victoza >> rash. In 09/2016, she had a steroid inj in back >> sugars 200s >> used Novolog for 3 days.  She  initially refused insulin.  Pt checks her sugars 1-2x a day: - am: 87-155, 167, 209 >> 66, 69, 91-134 >> 98-183, 211 >> 120-160, 174 - 2h after b'fast: 96-141 >> n/c >> 115-175 >> 113-139 >>  67, 125-160 - before lunch: n/c >> 70-150 >> 59, 127-146, 227 >> 92-162 - 2h after lunch:  132-145 >> 77 >> 94-150 >> 91 >> 130-170 - before dinner: 130-150 >> 118-145, 160 >> 140-199 >> 170 - 2h after dinner:  110-156, 290- sick >> n/c >> 120, 145-170, 210 - bedtime: 115-184, 222 (stress) >> 110-140 >> 145-177, 216 >> 181 - nighttime: n/c Lowest sugar was 77 >> 66 >> 59 >> 67; she has hypoglycemia awareness in the 90s. Highest sugar was  420 (steroids) >> ... 227 >> 210.  Glucometer: One Touch Verio  -+ CKD, last BUN/creatinine: 06/22/2022: 62/1.51, GFR 37, glucose 166 Lab Results  Component Value Date   BUN 55 (H) 06/13/2021   BUN 40 (H) 12/23/2020   CREATININE 1.55 (H) 06/13/2021   CREATININE 1.39 (H) 12/23/2020  11/21/2020: CMP normal, except glucose 106, BUN/creatinine 49/1.36, GFR 43 05/25/2020: CMP normal except: Glu 129, BUNs/creatinine 44/1.29, GFR 41, otherwise normal 12/31/2019: 42/1.37, GFR 38, glucose 193, otherwise normal CMP 03/21/2018: CMP normal with the exception of high  glucose at 143, BUN/creatinine 37/1.26, GFR 43 (kidney function improved from 09/2017) Urinalysis with 1+ protein 09/13/2017: Glucose 123, BUN 44/creatinine 1.34, GFR 40, previously 44 03/06/2017: ACR 222.8 08/29/2016: ACR 76.7 02/16/2016: 42/1.34 (GFR 40), Glu 213  12/26/2015: 25/1.23 (GFR 44), Glu 153 Restarted lisinopril.  -+ HL; last set of lipids: 06/07/2022: 90/212/28/29 11/21/2020: 117/204/35/49 05/25/2020: 106/204/33/40 12/31/2019: 102/147/33/43 Lab Results  Component Value Date   CHOL 110 02/04/2019   HDL 31.60 (L) 02/04/2019   LDLDIRECT 44.0 02/04/2019   TRIG 255.0 (H) 02/04/2019   CHOLHDL 3 02/04/2019  03/21/2018: 92/202/31/21 09/04/2017: 126/269/33/39 08/29/2016: 87/132/32/30 02/16/2016:  128/278/33/39  03/02/2014: 114/175/32/48 On Zocor 40.  - last eye exam was 05/10/2022: No DR (Dr. Nile Riggs).  She has a history of cataract surgery.  -She has numbness and tingling in her feet.  She sees podiatry -Dr. Ardelle Anton.  Latest foot exam was performed on 12/31/2022. She had amputation of 1/2 of the left hallux 05/06/2017.  She had osteomyelitis of the right second toe and she is status post left partial fourth toe amputation on 06/28/2021.  She also has a history of HTN, GERD, anemia. She started Humira (for psoriasis) but did not tolerate this >> stopped. She was in PT for her back pain. Also had dry needling.   ROS: + See HPI  I reviewed pt's medications, allergies, PMH, social hx, family hx, and changes were documented in the history of present illness. Otherwise, unchanged from my initial visit note.  Past Medical History:  Diagnosis Date   Allergy    seasonal   Anemia in chronic renal disease 06/05/2011   Anxiety    takes Ativan tid   Asthma    has an inhaler prn   Chronic kidney disease    Cough    DDD (degenerative disc disease), cervical 06/05/2011   DDD (degenerative disc disease), lumbosacral 06/05/2011   Depression    takes Cymbalta daily   Diabetes mellitus    takes Amaryl and Metformin daily   Diabetic neuropathy (HCC)    Diverticulosis    Dizziness    GERD (gastroesophageal reflux disease)    takes Protonix prn   Headache(784.0)    occasionally   History of bronchitis    last time 1yrs ago   History of colon polyps    History of MRSA infection    HTN (hypertension) 06/05/2011   takes Prinizide daily   Hyperlipidemia    takes Zocor daily   Insomnia    not on any meds at present time   Osteoarthritis    PONV (postoperative nausea and vomiting)    hard to wake up   Psoriasis 06/05/2011   on legs and uses cream prn   Seasonal allergies    but doesn't take any meds   Sleep apnea    uses CPAP   Tubular adenoma of colon    Past Surgical  History:  Procedure Laterality Date   ABDOMINAL HYSTERECTOMY  1982 and 1984   AMPUTATION  02/12/2012   Procedure: AMPUTATION RAY;  Surgeon: Toni Arthurs, MD;  Location: [facility] OR;  Service: Orthopedics;  Laterality: Right;  RIGHT 2ND TOE AMPUTATION    ANTERIOR LAT LUMBAR FUSION N/A 12/22/2020   Procedure: Thoracic twelve to Lumbar one posterior lateral lumbar interbody fusion with pedicle screw fixation Thoracic ten to Lumbar two with robot, posterior arthrodesis with allograft from Thoracic ten to Lumbar two;  Surgeon: Barnett Abu, MD;  Location: [facility] OR;  Service: Neurosurgery;  Laterality: N/A;   APPLICATION OF ROBOTIC  ASSISTANCE FOR SPINAL PROCEDURE N/A 12/22/2020   Procedure: APPLICATION OF ROBOTIC ASSISTANCE FOR SPINAL PROCEDURE;  Surgeon: Barnett Abu, MD;  Location: [facility] OR;  Service: Neurosurgery;  Laterality: N/A;  3C/RM 19   BACK SURGERY     x 3   CARDIAC CATHETERIZATION     08/09/04: 50% mid AV groove CX lesion, NL LM, LAD, Ramus, EF > 60% (Dr. Allyson Sabal)   CARPAL TUNNEL RELEASE Bilateral 2010   CERVICAL SPINE SURGERY     CHOLECYSTECTOMY  1977   COLONOSCOPY  05/30/2009, 09/30/13   [facility] 2011/Dr.Brodie 2015   COLONOSCOPY W/ POLYPECTOMY  06/23/2019   DILATION AND CURETTAGE OF UTERUS     ELBOW SURGERY     left d/t tendonitis   ESOPHAGOGASTRODUODENOSCOPY     LUMBAR WOUND DEBRIDEMENT N/A 07/15/2012   Procedure: I & D of Lumbar Wound: Possible Repair of CSF Leak;  Surgeon: Mariam Dollar, MD;  Location: [facility];  Service: Neurosurgery;  Laterality: N/A;  I & D of Lumbar Wound: Possible Repair of CSF Leak placement lumbar drain   POLYPECTOMY     right knee arthroscopy     x 2   SHOULDER ARTHROSCOPY Left    TOE AMPUTATION Bilateral    2nd toe   Social History   Social History   Marital status: Married    Spouse name: N/A   Number of children: 2   Occupational History   Disabled    Social History Main Topics   Smoking status: Never Smoker   Smokeless tobacco: Never Used      Comment: never used tobacco   Alcohol use No   Drug use: No   Current Outpatient Medications on File Prior to Visit  Medication Sig Dispense Refill   albuterol (PROVENTIL HFA;VENTOLIN HFA) 108 (90 BASE) MCG/ACT inhaler Inhale 1 puff into the lungs every 4 (four) hours as needed for wheezing or shortness of breath.      aspirin 81 MG tablet Take 81 mg by mouth daily.     budesonide-formoterol (SYMBICORT) 160-4.5 MCG/ACT inhaler Inhale 1 puff into the lungs 2 (two) times daily as needed (shortness of breath).     Cholecalciferol (VITAMIN D) 50 MCG (2000 UT) tablet Take 2,000 Units by mouth daily.     clobetasol (OLUX) 0.05 % topical foam Apply 1 application topically daily.     COLLAGEN PO Take 2 capsules by mouth daily.     DULoxetine (CYMBALTA) 60 MG capsule SMARTSIG:1 By Mouth     glipiZIDE (GLUCOTROL) 5 MG tablet Take 1 tablet (5 mg total) by mouth 2 (two) times daily before a meal. 180 tablet 3   hydrocortisone cream 1 % Apply 1 application topically 2 (two) times daily as needed (rash).     insulin degludec (TRESIBA FLEXTOUCH) 200 UNIT/ML FlexTouch Pen ADMINISTER 56 UNITS UNDER THE SKIN DAILY 18 mL 3   Insulin Pen Needle 32G X 5 MM MISC Use up to 4 needles as directed to inject insulin 400 each 3   Lancets (ONETOUCH ULTRASOFT) lancets      lisinopril-hydrochlorothiazide (ZESTORETIC) 20-12.5 MG tablet Take 2 tablets by mouth daily.     LORazepam (ATIVAN) 1 MG tablet Take 1 mg by mouth 2 (two) times daily as needed for anxiety.     metoprolol succinate (TOPROL-XL) 100 MG 24 hr tablet Take 100 mg by mouth daily. Take with or immediately following a meal.     metroNIDAZOLE (FLAGYL) 250 MG tablet Take 1 tablet (250 mg total) by mouth  3 (three) times daily. 21 tablet 0   Multiple Vitamins-Minerals (MULTIVITAMIN WITH MINERALS) tablet Take 1 tablet by mouth daily.      Needle, Disp, 32G X 5/16" MISC by Does not apply route.     NOVOLOG 100 UNIT/ML injection ADMINISTER 12 TO 20 UNITS UNDER THE  SKIN THREE TIMES DAILY BEFORE MEALS 50 mL 2   ONETOUCH VERIO test strip USE AS DIRECTED TWICE DAILY 200 strip 3   pantoprazole (PROTONIX) 40 MG tablet Take 40 mg by mouth daily.     Probiotic Product (PROBIOTIC DAILY PO) Take 1 capsule by mouth daily.      simvastatin (ZOCOR) 40 MG tablet Take 40 mg by mouth daily in the afternoon.     SKYRIZI PEN 150 MG/ML SOAJ Inject into the skin.     triamcinolone (KENALOG) 0.025 % ointment Apply 1 application topically 2 (two) times daily. (Patient taking differently: Apply 1 application  topically 2 (two) times daily as needed (toe ulcer).) 30 g 0   No current facility-administered medications on file prior to visit.   Allergies  Allergen Reactions   Victoza [Liraglutide] Hives and Rash   Hydromorphone Hives, Diarrhea, Swelling and Rash   Ultram [Tramadol] Rash   Acetaminophen     Pt was told not to take   Betadine [Povidone Iodine] Other (See Comments)    Skin peels   Bydureon [Exenatide] Diarrhea and Nausea And Vomiting   Chlorhexidine     Exacerbates psoriasis    Codeine Other (See Comments)    Severe headaches   Contrast Media [Iodinated Contrast Media] Nausea And Vomiting   Fluoxetine Other (See Comments)   Humira [Adalimumab] Hives   Hydrocodone Other (See Comments)    headache   Invokana [Canagliflozin] Other (See Comments)    Cannot take this 2/2 PAD and toe amputations   Iodine Hives   Januvia [Sitagliptin] Diarrhea   Morphine And Codeine Nausea And Vomiting   Olanzapine Other (See Comments)   Ozempic (0.25 Or 0.5 Mg-Dose) [Semaglutide(0.25 Or 0.5mg -Dos)] Nausea And Vomiting   Percocet [Oxycodone-Acetaminophen] Other (See Comments)    Headache    Prednisone Diarrhea and Nausea And Vomiting   Sulfa Antibiotics Itching   Valium Other (See Comments)    headache   Vortioxetine Other (See Comments)   Amoxicillin Hives and Rash    Did it involve swelling of the face/tongue/throat, SOB, or low BP? Unknown Did it involve sudden or  severe rash/hives, skin peeling, or any reaction on the inside of your mouth or nose? Yes Did you need to seek medical attention at a hospital or doctor's office? Yes When did it last happen?      unk If all above answers are "NO", may proceed with cephalosporin use.    Darvocet [Propoxyphene N-Acetaminophen] Rash and Other (See Comments)    headache   Fentanyl And Related Rash   Wellbutrin [Bupropion Hcl] Hives and Rash   Family History  Problem Relation Age of Onset   Hypertension Mother        died from infection s/p hip surgery   Heart attack Father    Colon polyps Brother    Colon cancer Brother 38   Colon cancer Maternal Grandfather    Stomach cancer Neg Hx    Esophageal cancer Neg Hx    Rectal cancer Neg Hx    Liver cancer Neg Hx    PE: BP (!) 140/60   Pulse 66   Ht 5\' 3"  (1.6 m)   Wt 243  lb 12.8 oz (110.6 kg)   SpO2 99%   BMI 43.19 kg/m  Wt Readings from Last 3 Encounters:  01/09/23 243 lb 12.8 oz (110.6 kg)  09/07/22 240 lb (108.9 kg)  05/04/22 240 lb 9.6 oz (109.1 kg)   Constitutional: overweight, in NAD, walks with a cane Eyes: no exophthalmos ENT: no masses palpated in neck, no cervical lymphadenopathy Cardiovascular: RRR, No MRG Respiratory: CTA B Musculoskeletal: no deformities Skin: no rashes Neurological: no tremor with outstretched hands  ASSESSMENT: 1. DM2, insulin-dependent, controlled, with complications: - PN - s/p B 2nd toe amputations 2/2 osteomyelitis - 2013 and 2015; 1/2 hallux 04/2017 - PAD -we cannot do the Invokana for her  - foot ulcer  2. HL  3.  Obesity class II  PLAN:  1. Patient with longstanding, uncontrolled, type 2 diabetes, on sulfonylurea, SGLT2 inhibitor and also basal-bolus insulin regimen with worse control at last visit, when HbA1c returned 7.1%.  At that time, sugars are mostly at goal but with hyperglycemic exceptions occasionally in the 200s later in the day.  She had several health problems since the previous visit  including an amputation of one of her toes.  Sugars were fluctuating but she was mostly checking in the morning and I advised her to also check some blood sugars later in the day and in fact suggested a CGM.  I did recommend her to check with her insurance and let me know which supplier we could use. -At today's visit, she brings a very good blood sugar log and sugars appear to be similar to before, mostly at goal but with hyperglycemic values especially before meals and better control blood sugars after meals with few exceptions. -She inquires about new medications for diabetes that help with weight loss.  She did try Ozempic in the past twice and could not tolerate it due to GI symptoms.  We discussed about Greggory Keen is being a new medication in the class and we can try this at a low dose and increase as needed.  I sent a prescription to her pharmacy.  We discussed about stopping it if she develops GI symptoms that are not improving.  In the meantime, we will continue the current regimen except I advised her to try to hold glipizide before dinner and see if the sugars change.  If not, we can continue without it.  Also, I did advise her that sugars may improve after Mounjaro to the point of lows, in which case, we need to decrease her insulin doses. -If she cannot get Mounjaro, I advised her to increase the [place] dose slightly. -We also discussed that she does not necessarily need to have a smart phone to have a CGM.  I sent a prescription for the freestyle libre 3 CGM and receiver to her pharmacy.  We may need to use a supplier if preferred by the insurance but will try the pharmacy first. - I suggested to:  Patient Instructions  Please continue: - Jardiance 10 mg daily before b'fast - Glipizide 5 mg before dinner (try to stop and see if sugars change) - Novolog 14-16 units 15 min before a meal 3x a day (up to 20 units with steroids) - Evaristo Bury 56 units at bedtime   Try to start: - Mounjaro 2.5 mg  weekly  If Mounjaro is not covered, then increase [place] to 60 units.  Please come back for a follow-up appointment in 4 months.  - we checked her HbA1c: 7.5% (higher) - advised to check  sugars at different times of the day - 4x a day, rotating check times - advised for yearly eye exams >> she is UTD - return to clinic in 4 months  2. HL - Reviewed latest lipid panel 06/2022: Fractions at goal with exception of high triglycerides - Continues Zocor 40 mg daily without side effects  3.  Obesity class II -She continues on Jardiance, which should also help with weight loss.  -Unfortunately, she could not tolerate Ozempic due to diverticulitis and abdominal pain -She was previously going to the weight management clinic-was on a 1400-calorie/day diet.  She came off the diet -At last visit, weight was higher by 30 pounds.  She was working on her diet. -At today's visit, weight is higher by 3 pounds.  She would like to try [facility].  Will start at a low dose and increase as tolerated.  + flu shot today  Carlus Pavlov, MD PhD [facility] Endocrinology

## 2023-01-10 DIAGNOSIS — L400 Psoriasis vulgaris: Secondary | ICD-10-CM | POA: Diagnosis not present

## 2023-01-31 DIAGNOSIS — M170 Bilateral primary osteoarthritis of knee: Secondary | ICD-10-CM | POA: Diagnosis not present

## 2023-01-31 DIAGNOSIS — G4733 Obstructive sleep apnea (adult) (pediatric): Secondary | ICD-10-CM | POA: Diagnosis not present

## 2023-02-07 DIAGNOSIS — M170 Bilateral primary osteoarthritis of knee: Secondary | ICD-10-CM | POA: Diagnosis not present

## 2023-02-12 ENCOUNTER — Telehealth: Payer: Self-pay | Admitting: Pharmacy Technician

## 2023-02-12 ENCOUNTER — Other Ambulatory Visit (HOSPITAL_COMMUNITY): Payer: Self-pay

## 2023-02-12 NOTE — Telephone Encounter (Signed)
Pharmacy Patient Advocate Encounter   Received notification from CoverMyMeds that prior authorization for [facility] is required/requested.   Insurance verification completed.   The patient is insured through [facility] ADVANTAGE/RX ADVANCE .   Per test claim: PA required; PA submitted to [facility] ADVANTAGE/RX ADVANCE via CoverMyMeds Key/confirmation #/EOC [id number] Status is pending

## 2023-02-14 DIAGNOSIS — M170 Bilateral primary osteoarthritis of knee: Secondary | ICD-10-CM | POA: Diagnosis not present

## 2023-02-27 ENCOUNTER — Encounter: Payer: Self-pay | Admitting: Internal Medicine

## 2023-02-28 ENCOUNTER — Other Ambulatory Visit (HOSPITAL_COMMUNITY): Payer: Self-pay

## 2023-02-28 NOTE — Telephone Encounter (Signed)
Pharmacy Patient Advocate Encounter  Received notification from [facility] ADVANTAGE/RX ADVANCE that Prior Authorization for [name] has been APPROVED from 02/12/23 to 02/12/24   PA #/Case ID/Reference #: PA Case ID #: [id number] (this particular one says it had an error and was unable to process, but when I try to resubmit, it says duplicate/approved.  Called [employer] for the approval information. Will upload approval letter as soon as it's received.

## 2023-03-02 DIAGNOSIS — G4733 Obstructive sleep apnea (adult) (pediatric): Secondary | ICD-10-CM | POA: Diagnosis not present

## 2023-03-04 ENCOUNTER — Ambulatory Visit: Payer: PPO | Admitting: Podiatry

## 2023-03-04 NOTE — Telephone Encounter (Signed)
Left a detailed message,   J.Derelle Cockrell,RMA  

## 2023-03-29 ENCOUNTER — Encounter: Payer: Self-pay | Admitting: Podiatry

## 2023-03-29 ENCOUNTER — Ambulatory Visit: Payer: PPO

## 2023-03-29 ENCOUNTER — Ambulatory Visit: Payer: PPO | Admitting: Podiatry

## 2023-03-29 DIAGNOSIS — B351 Tinea unguium: Secondary | ICD-10-CM

## 2023-03-29 DIAGNOSIS — M79674 Pain in right toe(s): Secondary | ICD-10-CM | POA: Diagnosis not present

## 2023-03-29 DIAGNOSIS — M778 Other enthesopathies, not elsewhere classified: Secondary | ICD-10-CM

## 2023-03-29 DIAGNOSIS — E1149 Type 2 diabetes mellitus with other diabetic neurological complication: Secondary | ICD-10-CM

## 2023-03-29 DIAGNOSIS — M79675 Pain in left toe(s): Secondary | ICD-10-CM

## 2023-03-29 DIAGNOSIS — L97311 Non-pressure chronic ulcer of right ankle limited to breakdown of skin: Secondary | ICD-10-CM | POA: Diagnosis not present

## 2023-03-29 DIAGNOSIS — M25571 Pain in right ankle and joints of right foot: Secondary | ICD-10-CM

## 2023-03-29 MED ORDER — MUPIROCIN 2 % EX OINT: 1.0000 | TOPICAL_OINTMENT | Freq: Two times a day (BID) | CUTANEOUS | 2 refills | Status: DC

## 2023-04-01 DIAGNOSIS — G4733 Obstructive sleep apnea (adult) (pediatric): Secondary | ICD-10-CM | POA: Diagnosis not present

## 2023-04-01 NOTE — Progress Notes (Signed)
Subjective: Chief Complaint  Patient presents with   Diabetic Ulcer    RM#11 Follow up on ulcer check and now has a spot on right foot ankle area raw skin no drainage.    72 year old female presents the office with above concerns.  She also states she fell injuring her right ankle and she scraped the side of her foot.  She does not report any open lesions otherwise.  No drainage or pus.  No fevers or chills.  Objective: AAO x3, NAD DP/PT pulses palpable bilaterally, CRT less than 3 seconds Status post multiple toe amputation of the left foot which are all well-healed.  The left fifth, right first, third, fourth, fifth nails are hypertrophic, dystrophic and elongated causing discomfort as a rub.  No swelling, redness or drainage.  No open lesions. Superficial abrasion noted along the fourth toe ruminant without any surrounding erythema, ascending cellulitis.  No fluctuation or crepitation.  There is no malodor.  No signs of infection otherwise. She has tenderness palpation on the right ankle mostly lateral aspect there is superficial abrasions from when she fell.  Is noted to possibly have signs of infection noted otherwise. Minimal callus left fifth toe.  Dry skin present bilaterally. No pain with calf compression, swelling, warmth, erythema  Assessment: Symptomatic onychomycosis, right ankle injury, superficial abrasion.   Plan:  Ankle injury, right -X-rays obtained reviewed.  Multiple views obtained.  No definitive evidence of acute fracture present. -Given her pain recommend immobilization in cam boot was dispensed today. -Elevation  Superficial ulcers -Recommend a small amount of antibiotic ointment dressing changes daily. -No signs of infection currently, continue to monitor  Symptomatic onychomycosis  -Sharply debrided the nails x 5 without any complication within  Return in about 3 weeks (around 04/19/2023).  Vivi Barrack DPM

## 2023-04-08 DIAGNOSIS — Z1231 Encounter for screening mammogram for malignant neoplasm of breast: Secondary | ICD-10-CM | POA: Diagnosis not present

## 2023-04-09 ENCOUNTER — Encounter: Payer: Self-pay | Admitting: Podiatry

## 2023-04-12 ENCOUNTER — Other Ambulatory Visit: Payer: Self-pay | Admitting: Internal Medicine

## 2023-04-12 DIAGNOSIS — E1165 Type 2 diabetes mellitus with hyperglycemia: Secondary | ICD-10-CM

## 2023-04-15 DIAGNOSIS — L570 Actinic keratosis: Secondary | ICD-10-CM | POA: Diagnosis not present

## 2023-04-15 DIAGNOSIS — L853 Xerosis cutis: Secondary | ICD-10-CM | POA: Diagnosis not present

## 2023-04-15 DIAGNOSIS — Z79899 Other long term (current) drug therapy: Secondary | ICD-10-CM | POA: Diagnosis not present

## 2023-04-15 DIAGNOSIS — L400 Psoriasis vulgaris: Secondary | ICD-10-CM | POA: Diagnosis not present

## 2023-04-15 DIAGNOSIS — L821 Other seborrheic keratosis: Secondary | ICD-10-CM | POA: Diagnosis not present

## 2023-04-19 ENCOUNTER — Ambulatory Visit: Payer: PPO | Admitting: Podiatry

## 2023-04-22 DIAGNOSIS — R922 Inconclusive mammogram: Secondary | ICD-10-CM | POA: Diagnosis not present

## 2023-04-22 DIAGNOSIS — N6311 Unspecified lump in the right breast, upper outer quadrant: Secondary | ICD-10-CM | POA: Diagnosis not present

## 2023-05-07 DIAGNOSIS — G4733 Obstructive sleep apnea (adult) (pediatric): Secondary | ICD-10-CM | POA: Diagnosis not present

## 2023-05-14 ENCOUNTER — Ambulatory Visit: Payer: PPO | Admitting: Internal Medicine

## 2023-05-23 ENCOUNTER — Other Ambulatory Visit: Payer: Self-pay | Admitting: Internal Medicine

## 2023-05-23 DIAGNOSIS — E1159 Type 2 diabetes mellitus with other circulatory complications: Secondary | ICD-10-CM

## 2023-06-03 ENCOUNTER — Encounter: Payer: Self-pay | Admitting: Internal Medicine

## 2023-06-03 ENCOUNTER — Ambulatory Visit: Payer: PPO | Admitting: Internal Medicine

## 2023-06-03 VITALS — BP 130/70 | HR 74 | Ht 63.0 in | Wt 241.4 lb

## 2023-06-03 DIAGNOSIS — E1159 Type 2 diabetes mellitus with other circulatory complications: Secondary | ICD-10-CM

## 2023-06-03 DIAGNOSIS — Z794 Long term (current) use of insulin: Secondary | ICD-10-CM

## 2023-06-03 DIAGNOSIS — E1165 Type 2 diabetes mellitus with hyperglycemia: Secondary | ICD-10-CM

## 2023-06-03 DIAGNOSIS — E66813 Obesity, class 3: Secondary | ICD-10-CM

## 2023-06-03 DIAGNOSIS — Z7984 Long term (current) use of oral hypoglycemic drugs: Secondary | ICD-10-CM | POA: Diagnosis not present

## 2023-06-03 DIAGNOSIS — E785 Hyperlipidemia, unspecified: Secondary | ICD-10-CM

## 2023-06-03 LAB — POCT GLYCOSYLATED HEMOGLOBIN (HGB A1C): Hemoglobin A1C: 7 % — AB (ref 4.0–5.6)

## 2023-06-03 MED ORDER — TIRZEPATIDE 2.5 MG/0.5ML ~~LOC~~ SOAJ: 2.5000 mg | SUBCUTANEOUS | 1 refills | Status: DC

## 2023-06-03 MED ORDER — FREESTYLE LIBRE 3 READER DEVI: 1.0000 | Freq: Once | 0 refills | Status: AC

## 2023-06-03 NOTE — Addendum Note (Signed)
Addended by: Pollie Meyer on: 06/03/2023 02:05 PM   Modules accepted: Orders

## 2023-06-03 NOTE — Progress Notes (Signed)
Patient ID: [name], female   DOB: 1950/07/11, 73 y.o.   MRN: [id number]   HPI: [name] is a 73 y.o.-year-old female, initially referred by her PCP, Dr. Clelia Croft, returning for f/u for DM2, dx in 1980s, insulin-dependent, controlled, with complications (PN - s/p B toe amputations 2/2 osteomyelitis, PAD, foot ulcer). She saw Dr. Leslie Dales previously. Last visit with me 4 months ago.  Interim history: No increased urination, blurry vision, nausea, chest pain.  Reviewed her HbA1c levels: Lab Results  Component Value Date   HGBA1C 7.5 (A) 01/09/2023   HGBA1C 7.1 (A) 09/04/2022   HGBA1C 6.8 (A) 05/04/2022   HGBA1C 7.0 (A) 01/01/2022   HGBA1C 7.6 (A) 08/30/2021   HGBA1C 8.5 (A) 05/31/2021   HGBA1C 8.5 (A) 02/21/2021   HGBA1C 7.8 (H) 12/14/2020   HGBA1C 8.4 (A) 09/30/2020   HGBA1C 8.1 (A) 06/30/2020   HGBA1C 9.4 (A) 04/22/2020   HGBA1C 9.1 (A) 01/20/2020   HGBA1C 10.3 (A) 09/29/2019   HGBA1C 9.5 (A) 07/01/2019   HGBA1C 8.4 (A) 02/04/2019   HGBA1C 8.0 (A) 09/30/2018   HGBA1C 7.1 (A) 05/29/2018   HGBA1C 6.7 (A) 01/27/2018   HGBA1C 7.3 (A) 09/24/2017   HGBA1C 7.0 05/27/2017  09/30/2020: HbA1c calculated from fructosamine is much better, at 6.6%, correlating more with her blood sugars at home. 02/16/2016: 7.1% 06/2015: 6.6%  Pt is on a regimen of: - Jardiance 25 >> 10 mg daily in am - Glipizide 5 mg 2x a day before meals >>  5 mg before dinner - Tresiba 20 >> ... 52 >> 46 >> 56 units daily  - Novolog 7 to 10 >> ... 12-16 (20) >> 14-16 units before each meal  --she was not able to start yet, despite an approved PA. She stopped Ozempic 04/2019 due to GI symptoms.  We retried it in 09/2019 with same symptoms so he had to stop. She stopped Metformin ER >> AP and diarrhea She was on Victoza >> rash.  We tried to get her a CGM but she was not able to start this.  Pt checks her sugars 3-4 times a day: - am: 66, 69, 91-134 >> 98-183, 211 >> 120-160, 174 >> 115-160, 176 - 2h  after b'fast:115-175 >> 113-139 >>  67, 125-160 >> 111-151 - before lunch: 70-150 >> 59, 127-146, 227 >> 92-162 >> 130-150, 266 - 2h after lunch:   77 >> 94-150 >> 91 >> 130-170 >> 125-150, 200  - before dinner: 118-145, 160 >> 140-199 >> 170 >> 143-161, 173 - 2h after dinner:  110-156, 290 >> n/c >> 120, 145-170, 210 >> 119-175 - bedtime: 110-140 >> 145-177, 216 >> 181 >> 170 - nighttime: n/c Lowest sugar was 66 >> 59 >> 67; she has hypoglycemia awareness in the 90s. Highest sugar was  420 (steroids) >> ... 227 >> 210 >> 266.  Glucometer: One Touch Verio  -+ CKD, last BUN/creatinine: 01/08/2023: 42/1.39, GFR 41, glucose 196 06/22/2022: 62/1.51, GFR 37, glucose 166 Lab Results  Component Value Date   BUN 55 (H) 06/13/2021   BUN 40 (H) 12/23/2020   CREATININE 1.55 (H) 06/13/2021   CREATININE 1.39 (H) 12/23/2020  On lisinopril.  -+ HL; last set of lipids: 06/07/2022: 90/212/28/29 Lab Results  Component Value Date   CHOL 110 02/04/2019   HDL 31.60 (L) 02/04/2019   LDLDIRECT 44.0 02/04/2019   TRIG 255.0 (H) 02/04/2019   CHOLHDL 3 02/04/2019  On Zocor 40.  - last eye exam was 05/10/2022: No DR (  Dr. Nile Riggs).  She has a history of cataract surgery.  -She has numbness and tingling in her feet.  She sees podiatry -Dr. Ardelle Anton.  Latest foot exam was performed on 12/31/2022. She had amputation of 1/2 of the left hallux 05/06/2017.  She had osteomyelitis of the right second toe and she is status post left partial fourth toe amputation on 06/28/2021.  She also has a history of HTN, GERD, anemia. She started Humira (for psoriasis) but did not tolerate this >> stopped. She was in PT for her back pain. Also had dry needling.   ROS: + See HPI  I reviewed pt's medications, allergies, PMH, social hx, family hx, and changes were documented in the history of present illness. Otherwise, unchanged from my initial visit note.  Past Medical History:  Diagnosis Date   Allergy    seasonal   Anemia  in chronic renal disease 06/05/2011   Anxiety    takes Ativan tid   Asthma    has an inhaler prn   Chronic kidney disease    Cough    DDD (degenerative disc disease), cervical 06/05/2011   DDD (degenerative disc disease), lumbosacral 06/05/2011   Depression    takes Cymbalta daily   Diabetes mellitus    takes Amaryl and Metformin daily   Diabetic neuropathy (HCC)    Diverticulosis    Dizziness    GERD (gastroesophageal reflux disease)    takes Protonix prn   Headache(784.0)    occasionally   History of bronchitis    last time 76yrs ago   History of colon polyps    History of MRSA infection    HTN (hypertension) 06/05/2011   takes Prinizide daily   Hyperlipidemia    takes Zocor daily   Insomnia    not on any meds at present time   Osteoarthritis    PONV (postoperative nausea and vomiting)    hard to wake up   Psoriasis 06/05/2011   on legs and uses cream prn   Seasonal allergies    but doesn't take any meds   Sleep apnea    uses CPAP   Tubular adenoma of colon    Past Surgical History:  Procedure Laterality Date   ABDOMINAL HYSTERECTOMY  1982 and 1984   AMPUTATION  02/12/2012   Procedure: AMPUTATION RAY;  Surgeon: Toni Arthurs, MD;  Location: [facility] OR;  Service: Orthopedics;  Laterality: Right;  RIGHT 2ND TOE AMPUTATION    ANTERIOR LAT LUMBAR FUSION N/A 12/22/2020   Procedure: Thoracic twelve to Lumbar one posterior lateral lumbar interbody fusion with pedicle screw fixation Thoracic ten to Lumbar two with robot, posterior arthrodesis with allograft from Thoracic ten to Lumbar two;  Surgeon: Barnett Abu, MD;  Location: [facility] OR;  Service: Neurosurgery;  Laterality: N/A;   APPLICATION OF ROBOTIC ASSISTANCE FOR SPINAL PROCEDURE N/A 12/22/2020   Procedure: APPLICATION OF ROBOTIC ASSISTANCE FOR SPINAL PROCEDURE;  Surgeon: Barnett Abu, MD;  Location: [facility] OR;  Service: Neurosurgery;  Laterality: N/A;  3C/RM 19   BACK SURGERY     x 3   CARDIAC CATHETERIZATION     08/09/04: 50%  mid AV groove CX lesion, NL LM, LAD, Ramus, EF > 60% (Dr. Allyson Sabal)   CARPAL TUNNEL RELEASE Bilateral 2010   CERVICAL SPINE SURGERY     CHOLECYSTECTOMY  1977   COLONOSCOPY  05/30/2009, 09/30/13   [facility] 2011/Dr.Brodie 2015   COLONOSCOPY W/ POLYPECTOMY  06/23/2019   DILATION AND CURETTAGE OF UTERUS     ELBOW SURGERY  left d/t tendonitis   ESOPHAGOGASTRODUODENOSCOPY     LUMBAR WOUND DEBRIDEMENT N/A 07/15/2012   Procedure: I & D of Lumbar Wound: Possible Repair of CSF Leak;  Surgeon: Mariam Dollar, MD;  Location: [facility];  Service: Neurosurgery;  Laterality: N/A;  I & D of Lumbar Wound: Possible Repair of CSF Leak placement lumbar drain   POLYPECTOMY     right knee arthroscopy     x 2   SHOULDER ARTHROSCOPY Left    TOE AMPUTATION Bilateral    2nd toe   Social History   Social History   Marital status: Married    Spouse name: N/A   Number of children: 2   Occupational History   Disabled    Social History Main Topics   Smoking status: Never Smoker   Smokeless tobacco: Never Used     Comment: never used tobacco   Alcohol use No   Drug use: No   Current Outpatient Medications on File Prior to Visit  Medication Sig Dispense Refill   albuterol (PROVENTIL HFA;VENTOLIN HFA) 108 (90 BASE) MCG/ACT inhaler Inhale 1 puff into the lungs every 4 (four) hours as needed for wheezing or shortness of breath.      aspirin 81 MG tablet Take 81 mg by mouth daily.     budesonide-formoterol (SYMBICORT) 160-4.5 MCG/ACT inhaler Inhale 1 puff into the lungs 2 (two) times daily as needed (shortness of breath).     Cholecalciferol (VITAMIN D) 50 MCG (2000 UT) tablet Take 2,000 Units by mouth daily.     clobetasol (OLUX) 0.05 % topical foam Apply 1 application topically daily.     COLLAGEN PO Take 2 capsules by mouth daily.     Continuous Glucose Sensor (FREESTYLE LIBRE 3 SENSOR) MISC 1 each by Does not apply route every 14 (fourteen) days. 6 each 3   DULoxetine (CYMBALTA) 60 MG capsule SMARTSIG:1 By  Mouth     glipiZIDE (GLUCOTROL) 5 MG tablet Take 1 tablet (5 mg total) by mouth 2 (two) times daily before a meal. 180 tablet 3   hydrocortisone cream 1 % Apply 1 application topically 2 (two) times daily as needed (rash).     insulin degludec (TRESIBA FLEXTOUCH) 200 UNIT/ML FlexTouch Pen ADMINISTER 56 UNITS UNDER THE SKIN DAILY 18 mL 3   Insulin Pen Needle 32G X 5 MM MISC Use up to 4 needles as directed to inject insulin 400 each 3   Insulin Syringe-Needle U-100 (ULTICARE INSULIN SYRINGE) 31G X 5/16" 1 ML MISC USE 3 SYRINGES DAILY TO INJECT MEALTIME INSULIN 300 each 3   Lancets (ONETOUCH ULTRASOFT) lancets      lisinopril-hydrochlorothiazide (ZESTORETIC) 20-12.5 MG tablet Take 2 tablets by mouth daily.     metoprolol succinate (TOPROL-XL) 100 MG 24 hr tablet Take 100 mg by mouth daily. Take with or immediately following a meal.     Multiple Vitamins-Minerals (MULTIVITAMIN WITH MINERALS) tablet Take 1 tablet by mouth daily.      mupirocin ointment (BACTROBAN) 2 % Apply 1 Application topically 2 (two) times daily. 30 g 2   Needle, Disp, 32G X 5/16" MISC by Does not apply route.     NOVOLOG 100 UNIT/ML injection ADMINISTER 12 TO 20 UNITS UNDER THE SKIN THREE TIMES DAILY BEFORE MEALS 45 mL 3   ONETOUCH VERIO test strip USE AS DIRECTED TWICE DAILY 200 strip 3   pantoprazole (PROTONIX) 40 MG tablet Take 40 mg by mouth daily.     Probiotic Product (PROBIOTIC DAILY PO) Take 1  capsule by mouth daily.      simvastatin (ZOCOR) 40 MG tablet Take 40 mg by mouth daily in the afternoon.     SKYRIZI PEN 150 MG/ML SOAJ Inject into the skin.     tirzepatide (MOUNJARO) 2.5 MG/0.5ML Pen Inject 2.5 mg into the skin once a week. 2 mL 3   No current facility-administered medications on file prior to visit.   Allergies  Allergen Reactions   Victoza [Liraglutide] Hives and Rash   Hydromorphone Hives, Diarrhea, Swelling and Rash   Ultram [Tramadol] Rash   Acetaminophen     Pt was told not to take   Betadine  [Povidone Iodine] Other (See Comments)    Skin peels   Bydureon [Exenatide] Diarrhea and Nausea And Vomiting   Chlorhexidine     Exacerbates psoriasis    Codeine Other (See Comments)    Severe headaches   Contrast Media [Iodinated Contrast Media] Nausea And Vomiting   Fluoxetine Other (See Comments)   Humira [Adalimumab] Hives   Hydrocodone Other (See Comments)    headache   Invokana [Canagliflozin] Other (See Comments)    Cannot take this 2/2 PAD and toe amputations   Iodine Hives   Januvia [Sitagliptin] Diarrhea   Morphine And Codeine Nausea And Vomiting   Olanzapine Other (See Comments)   Ozempic (0.25 Or 0.5 Mg-Dose) [Semaglutide(0.25 Or 0.5mg -Dos)] Nausea And Vomiting   Percocet [Oxycodone-Acetaminophen] Other (See Comments)    Headache    Prednisone Diarrhea and Nausea And Vomiting   Sulfa Antibiotics Itching   Valium Other (See Comments)    headache   Vortioxetine Other (See Comments)   Amoxicillin Hives and Rash    Did it involve swelling of the face/tongue/throat, SOB, or low BP? Unknown Did it involve sudden or severe rash/hives, skin peeling, or any reaction on the inside of your mouth or nose? Yes Did you need to seek medical attention at a hospital or doctor's office? Yes When did it last happen?      unk If all above answers are "NO", may proceed with cephalosporin use.    Darvocet [Propoxyphene N-Acetaminophen] Rash and Other (See Comments)    headache   Fentanyl And Related Rash   Wellbutrin [Bupropion Hcl] Hives and Rash   Family History  Problem Relation Age of Onset   Hypertension Mother        died from infection s/p hip surgery   Heart attack Father    Colon polyps Brother    Colon cancer Brother 17   Colon cancer Maternal Grandfather    Stomach cancer Neg Hx    Esophageal cancer Neg Hx    Rectal cancer Neg Hx    Liver cancer Neg Hx    PE: BP 130/70   Pulse 74   Ht 5\' 3"  (1.6 m)   Wt 241 lb 6.4 oz (109.5 kg)   SpO2 98%   BMI 42.76 kg/m   Wt Readings from Last 3 Encounters:  06/03/23 241 lb 6.4 oz (109.5 kg)  01/09/23 243 lb 12.8 oz (110.6 kg)  09/07/22 240 lb (108.9 kg)   Constitutional: overweight, in NAD, walks with a cane Eyes: no exophthalmos ENT: no masses palpated in neck, no cervical lymphadenopathy Cardiovascular: RRR, No MRG Respiratory: CTA B Musculoskeletal: no deformities Skin: no rashes Neurological: no tremor with outstretched hands  ASSESSMENT: 1. DM2, insulin-dependent, controlled, with complications: - PN - s/p B 2nd toe amputations 2/2 osteomyelitis - 2013 and 2015; 1/2 hallux 04/2017 - PAD  - CKD -  foot ulcer  2. HL  3.  Obesity class II  PLAN:  1. Patient with longstanding, uncontrolled, type 2 diabetes, on SGLT2 inhibitor, sulfonylurea, basal large boluses regimen and now also GLP-1/GIP receptor agonist, started at last visit.  She had abdominal pain with Ozempic, possibly due to diverticulitis but at last visit she wanted to try [facility].  We sent a prescription for the lowest dose to her pharmacy.  We did discuss that if the sugars improved, to stop the glipizide.  HbA1c at last visit higher, at 7.5%.  At that time I again recommended a CGM -she was still not able to obtain these. -At today's visit, sugars appear to be improved from before despite the fact that she was not able to obtain the [facility].  A PA was approved but the pharmacy did not have it.  At today's visit I gave her written prescription for this.  I advised her to let me know in a month if we can increase the dose.  -Sugars appear to be improved at today's visit so we discussed that after she starts [facility], she may be able to stop glipizide and also possibly reduce the insulin doses.  I advised her to let me know. - I suggested to:  Patient Instructions  Please continue: - Jardiance 10 mg daily before b'fast - Glipizide 5 mg before dinner (try to stop and see if sugars change) - Novolog 14-16 units 15 min before a meal 3x a  day (up to 20 units with steroids) - Evaristo Bury 60 units at bedtime   Try to start: - Mounjaro 2.5 mg weekly  Please come back for a follow-up appointment in 3-4 months.  - we checked her HbA1c: 7.0% (better) - advised to check sugars at different times of the day - 4x a day, rotating check times - advised for yearly eye exams >> she is UTD - will check annual labs today -we discussed that she may need a referral to nephrology. - return to clinic in 3-4 months  2. HL - Reviewed latest lipid panel from 06/2022: Fractions at goal with exception of high triglycerides - Continues the statin (Zocor 40 mg daily) without side effects. - Will check a lipid panel today  3.  Obesity class III -She continues on SGLT2 inhibitor and also GLP-1/GIP receptor agonist, which should both help with weight loss -Unfortunately, she could not tolerate Ozempic due to diverticulitis and abdominal pain -She was previously going to the weight management clinic-was on a 1400-calorie/day diet.  She came off the diet -At last visit weight was higher by 3 pounds.  She wanted to try [facility].  I sent a prescription for the lowest dose, 2.5 mg to her pharmacy.  PA was approved but she was not able to start.  I gave her a written prescription to take her to another pharmacy, since her main pharmacy did not have it in stock.  Orders Placed This Encounter  Procedures   Microalbumin / creatinine urine ratio   Lipid Panel w/reflex Direct LDL   COMPLETE METABOLIC PANEL WITH GFR   Carlus Pavlov, MD PhD [facility] Endocrinology

## 2023-06-03 NOTE — Patient Instructions (Addendum)
Please continue: - Jardiance 10 mg daily before b'fast - Glipizide 5 mg before dinner (try to stop and see if sugars change) - Novolog 14-16 units 15 min before a meal 3x a day (up to 20 units with steroids) - Tresiba 60 units at bedtime (try to decrease to 50 units if the sugars improve after adding Mounjaro)  Try to start: - Mounjaro 2.5 mg weekly  Please come back for a follow-up appointment in 3-4 months.

## 2023-06-04 ENCOUNTER — Encounter: Payer: Self-pay | Admitting: Internal Medicine

## 2023-06-04 LAB — COMPLETE METABOLIC PANEL WITH GFR
AG Ratio: 2 (calc) (ref 1.0–2.5)
ALT: 13 U/L (ref 6–29)
AST: 12 U/L (ref 10–35)
Albumin: 4.1 g/dL (ref 3.6–5.1)
Alkaline phosphatase (APISO): 63 U/L (ref 37–153)
BUN/Creatinine Ratio: 37 (calc) — ABNORMAL HIGH (ref 6–22)
BUN: 46 mg/dL — ABNORMAL HIGH (ref 7–25)
CO2: 29 mmol/L (ref 20–32)
Calcium: 9.3 mg/dL (ref 8.6–10.4)
Chloride: 101 mmol/L (ref 98–110)
Creat: 1.25 mg/dL — ABNORMAL HIGH (ref 0.60–1.00)
Globulin: 2.1 g/dL (ref 1.9–3.7)
Glucose, Bld: 65 mg/dL (ref 65–99)
Potassium: 4.5 mmol/L (ref 3.5–5.3)
Sodium: 141 mmol/L (ref 135–146)
Total Bilirubin: 0.4 mg/dL (ref 0.2–1.2)
Total Protein: 6.2 g/dL (ref 6.1–8.1)
eGFR: 46 mL/min/{1.73_m2} — ABNORMAL LOW (ref >=60)

## 2023-06-04 LAB — MICROALBUMIN / CREATININE URINE RATIO
Creatinine, Urine: 56 mg/dL (ref 20–275)
Microalb Creat Ratio: 1500 mg/g{creat} — ABNORMAL HIGH (ref <30)
Microalb, Ur: 84 mg/dL

## 2023-06-04 LAB — LIPID PANEL W/REFLEX DIRECT LDL
Cholesterol: 107 mg/dL (ref <200)
HDL: 33 mg/dL — ABNORMAL LOW (ref >=50)
LDL Cholesterol (Calc): 47 mg/dL
Non-HDL Cholesterol (Calc): 74 mg/dL (ref <130)
Total CHOL/HDL Ratio: 3.2 (calc) (ref <5.0)
Triglycerides: 206 mg/dL — ABNORMAL HIGH (ref <150)

## 2023-06-10 ENCOUNTER — Encounter: Payer: Self-pay | Admitting: Internal Medicine

## 2023-06-11 DIAGNOSIS — R809 Proteinuria, unspecified: Secondary | ICD-10-CM | POA: Diagnosis not present

## 2023-06-13 ENCOUNTER — Ambulatory Visit: Payer: PPO

## 2023-06-13 ENCOUNTER — Ambulatory Visit: Payer: PPO | Admitting: Podiatry

## 2023-06-13 ENCOUNTER — Encounter: Payer: Self-pay | Admitting: Podiatry

## 2023-06-13 DIAGNOSIS — M778 Other enthesopathies, not elsewhere classified: Secondary | ICD-10-CM

## 2023-06-13 DIAGNOSIS — S93401S Sprain of unspecified ligament of right ankle, sequela: Secondary | ICD-10-CM

## 2023-06-13 MED ORDER — GABAPENTIN 100 MG PO CAPS: 100.0000 mg | ORAL_CAPSULE | Freq: Every day | ORAL | 0 refills | Status: DC

## 2023-06-14 ENCOUNTER — Ambulatory Visit
Admission: RE | Admit: 2023-06-14 | Discharge: 2023-06-14 | Disposition: A | Discharge status: Home / Self Care | Payer: PPO | Source: Ambulatory Visit | Attending: Podiatry | Admitting: Podiatry

## 2023-06-14 DIAGNOSIS — S93401S Sprain of unspecified ligament of right ankle, sequela: Secondary | ICD-10-CM

## 2023-06-16 NOTE — Progress Notes (Signed)
 Subjective: Chief Complaint  Patient presents with   Ankle Pain    RM#13 Follow up on right ankle patient states was unable to wear boot but purchased brace and has helped doing better.     73 year old female presents the office with above concerns.  States that she still having pain in her ankle.  She does not report any recent injuries.  She did get a brace over-the-counter which has been helping some.  No open lesions or any increase in warmth or redness that she reports.    Objective: AAO x3, NAD DP/PT pulses palpable bilaterally, CRT less than 3 seconds She has tenderness palpation on the right ankle mostly lateral aspect and there is tenderness palpation of this area.  No erythema or warmth.  She is able to move the ankle.  No pain along Achilles tendon today.  There is no open lesions.  No erythema or warmth.   No pain with calf compression, swelling, warmth, erythema  Assessment: Right ankle pain  Plan:  Ankle injury, right -X-rays obtained reviewed of the foot and ankle.  Multiple views obtained.  No definitive evidence of acute fracture present.  X-ray changes present.  Previous second amputation present. -Given ongoing nature of symptoms we will order an MRI to further evaluate.  Continue immobilization ankle brace for now.  Elevation  Return for MRI results.  Donnice JONELLE Fees DPM

## 2023-06-21 ENCOUNTER — Encounter: Payer: Self-pay | Admitting: Podiatry

## 2023-06-22 NOTE — Telephone Encounter (Signed)
Herbert Seta, can you check on the status of the MRI?

## 2023-06-24 ENCOUNTER — Encounter: Payer: Self-pay | Admitting: Podiatry

## 2023-06-25 ENCOUNTER — Telehealth: Payer: Self-pay | Admitting: Podiatry

## 2023-06-25 NOTE — Telephone Encounter (Signed)
Lvm for pt to call to schedule an appt with Dr Ardelle Anton for mri results in the next week or two.

## 2023-07-05 ENCOUNTER — Other Ambulatory Visit: Payer: Self-pay | Admitting: Internal Medicine

## 2023-07-08 ENCOUNTER — Other Ambulatory Visit: Payer: Self-pay | Admitting: Internal Medicine

## 2023-07-08 DIAGNOSIS — E1159 Type 2 diabetes mellitus with other circulatory complications: Secondary | ICD-10-CM

## 2023-07-09 ENCOUNTER — Other Ambulatory Visit: Payer: Self-pay

## 2023-07-09 ENCOUNTER — Other Ambulatory Visit: Payer: Self-pay | Admitting: Podiatry

## 2023-07-09 ENCOUNTER — Ambulatory Visit: Payer: PPO | Admitting: Podiatry

## 2023-07-09 DIAGNOSIS — S96911S Strain of unspecified muscle and tendon at ankle and foot level, right foot, sequela: Secondary | ICD-10-CM

## 2023-07-09 DIAGNOSIS — L97521 Non-pressure chronic ulcer of other part of left foot limited to breakdown of skin: Secondary | ICD-10-CM

## 2023-07-12 ENCOUNTER — Encounter: Payer: Self-pay | Admitting: Internal Medicine

## 2023-07-12 NOTE — Progress Notes (Signed)
 Subjective: Chief Complaint  Patient presents with   MRI Results    Here to discuss results from MRI that she had about a week ago. She also has a small wound on the LT 3rd and LT 5th that she would like for you to look at. These have been present for about a week, using antibiotic cream and a bandaid. No redness, no pain, no drainage.      73 year old female presents the office with above concerns.  She states that on the right side she does feel better when she wears the brace is not hurting as bad.  She is not reporting new injuries.  Patient has a small spot in the left fifth toe.  She is not sure how it started.  No drainage or pus or increasing swelling redness.  No pain although she does have neuropathy.  No fevers or chills.  No other concerns.    Objective: AAO x3, NAD DP/PT pulses palpable bilaterally, CRT less than 3 seconds She has tenderness palpation on the right ankle mostly lateral aspect of the course the peroneal tendon but subjectively she states it feels better than it had been previously.  Clinically the tendon appears to be intact, able to appreciate any area pinpoint tenderness. On the left there is a contracture present of the fifth toe previous amputations of the toes noted.  There is a very small superficial scab present on the fifth toe and also debridement of the third toe.  There is no edema, erythema, drainage or pus or any signs of infection. No pain with calf compression, swelling, warmth, erythema  Assessment: Right ankle pain; superficial wounds left foot  Plan:  Ankle injury, right -Reviewed MRI.  We discussed with this conservative as well as surgical options.  Weeks would be to continue with conservative management.  Continue immobilization as it seems to be feeling better.  I offered physical therapy as well.  Continue ice and elevate.  Preulcerative, superficial wounds left foot -Currently no signs of infection.  Recommend a small amount of antibiotic  ointment followed by dressing daily.  Offloading.  Monitoring signs or symptoms of infection.  These are quite small and soft and heal uneventfully however there is any worsening or no improvement next 1 to 2 weeks, we can immediately. -Monitor for any clinical signs or symptoms of infection and directed to call the office immediately should any occur or go to the ER.  Return in about 4 weeks (around 08/06/2023).  Vivi Barrack DPM

## 2023-07-15 ENCOUNTER — Telehealth (INDEPENDENT_AMBULATORY_CARE_PROVIDER_SITE_OTHER): Payer: Self-pay | Admitting: Otolaryngology

## 2023-07-15 NOTE — Telephone Encounter (Signed)
 I called and spoke with the patient on Friday afternoon and advised to her that sometimes when you are new with starting the sensor her numbers could read differently and she should check her blood sugar with a glucometer for a few days to compare.

## 2023-07-15 NOTE — Telephone Encounter (Signed)
 Left vm to confirm appt and location for 07/16/2023.

## 2023-07-16 ENCOUNTER — Encounter (INDEPENDENT_AMBULATORY_CARE_PROVIDER_SITE_OTHER): Payer: Self-pay

## 2023-07-16 ENCOUNTER — Ambulatory Visit (INDEPENDENT_AMBULATORY_CARE_PROVIDER_SITE_OTHER): Payer: PPO

## 2023-07-16 VITALS — BP 174/74 | HR 74 | Ht 63.0 in | Wt 230.0 lb

## 2023-07-16 DIAGNOSIS — H6122 Impacted cerumen, left ear: Secondary | ICD-10-CM

## 2023-07-16 DIAGNOSIS — H903 Sensorineural hearing loss, bilateral: Secondary | ICD-10-CM

## 2023-07-16 DIAGNOSIS — H6982 Other specified disorders of Eustachian tube, left ear: Secondary | ICD-10-CM | POA: Diagnosis not present

## 2023-07-16 MED ORDER — OFLOXACIN 0.3 % OT SOLN
5.0000 [drp] | Freq: Every day | OTIC | 5 refills | Status: AC
Start: 2023-07-16 — End: 2023-07-23

## 2023-07-17 DIAGNOSIS — H6122 Impacted cerumen, left ear: Secondary | ICD-10-CM | POA: Insufficient documentation

## 2023-07-17 DIAGNOSIS — H903 Sensorineural hearing loss, bilateral: Secondary | ICD-10-CM | POA: Insufficient documentation

## 2023-07-17 DIAGNOSIS — H6982 Other specified disorders of Eustachian tube, left ear: Secondary | ICD-10-CM | POA: Insufficient documentation

## 2023-07-17 NOTE — Progress Notes (Signed)
 Patient ID: [name], female   DOB: 1950/10/11, 73 y.o.   MRN: [id number]  Follow-up: Left ear eustachian tube dysfunction, bilateral hearing loss  HPI: The patient is a 73 year old female who presents today for her follow-up evaluation.  The patient has a history of left ear eustachian tube dysfunction.  She underwent left myringotomy and tube placement in March 2024.  She also has a history of bilateral high-frequency sensorineural hearing loss.  According to the patient, she was doing well until 2 months ago, when she started experiencing increasing left ear pressure.  She has not noted any otorrhea or significant change in her hearing.  Exam: General: Communicates without difficulty, well nourished, no acute distress. Head: Normocephalic, no evidence injury, no tenderness, facial buttresses intact without stepoff. Face/sinus: No tenderness to palpation and percussion. Facial movement is normal and symmetric. Eyes: PERRL, EOMI. No scleral icterus, conjunctivae clear. Neuro: CN II exam reveals vision grossly intact.  No nystagmus at any point of gaze. Ears: Auricles well formed without lesions.  Left ear cerumen impaction, completely encasing the left ventilating tube.  Nose: External evaluation reveals normal support and skin without lesions.  Dorsum is intact.  Anterior rhinoscopy reveals congested mucosa over anterior aspect of inferior turbinates and intact septum.  No purulence noted. Oral:  Oral cavity and oropharynx are intact, symmetric, without erythema or edema.  Mucosa is moist without lesions. Neck: Full range of motion without pain.  There is no significant lymphadenopathy.  No masses palpable.  Thyroid bed within normal limits to palpation.  Parotid glands and submandibular glands equal bilaterally without mass.  Trachea is midline. Neuro:  CN 2-12 grossly intact. Gait normal.   Assessment: 1.  Left ear cerumen impaction, completely encasing the left ventilating tube.  The patient  cannot tolerate the cerumen removal procedure. 2.  Left ear eustachian tube dysfunction. 3.  Subjectively stable bilateral high-frequency sensorineural hearing loss.  Plan: 1.  The physical exam findings are reviewed with the patient. 2.  Floxin otic 5 drops left ear twice daily for 1 week. 3.  The patient will return for reevaluation in 1 week.

## 2023-07-24 ENCOUNTER — Telehealth (INDEPENDENT_AMBULATORY_CARE_PROVIDER_SITE_OTHER): Payer: Self-pay | Admitting: Otolaryngology

## 2023-07-24 NOTE — Telephone Encounter (Signed)
 Confirmed appt & location [id number] afm

## 2023-07-25 ENCOUNTER — Ambulatory Visit (INDEPENDENT_AMBULATORY_CARE_PROVIDER_SITE_OTHER): Admitting: Otolaryngology

## 2023-07-25 ENCOUNTER — Encounter (INDEPENDENT_AMBULATORY_CARE_PROVIDER_SITE_OTHER): Payer: Self-pay

## 2023-07-25 VITALS — HR 74 | Ht 63.0 in | Wt 230.0 lb

## 2023-07-25 DIAGNOSIS — H6122 Impacted cerumen, left ear: Secondary | ICD-10-CM | POA: Diagnosis not present

## 2023-07-26 DIAGNOSIS — Z7952 Long term (current) use of systemic steroids: Secondary | ICD-10-CM | POA: Diagnosis not present

## 2023-07-26 DIAGNOSIS — E2839 Other primary ovarian failure: Secondary | ICD-10-CM | POA: Diagnosis not present

## 2023-07-28 NOTE — Progress Notes (Signed)
 Patient ID: [name], female   DOB: [birth year], 73 y.o.   MRN: [id number]  Follow-up: Left ear cerumen impaction  Procedure: Left ear cerumen disimpaction.   Indication: Cerumen impaction, resulting in ear discomfort and conductive hearing loss.   Description: The patient is placed supine on the operating table. Under the operating microscope, the left ear canal is examined and is noted to be impacted with cerumen. The cerumen is carefully removed with a combination of suction catheters, cerumen curette, and alligator forceps. After the cerumen removal, the left tube is noted to be in place and patent.  No drainage is noted.  The patient tolerated the procedure well.  Follow-up care:  The patient is instructed not to use Q-tips to clean the ear canals. The patient will follow up in 6 months.

## 2023-07-29 ENCOUNTER — Encounter: Payer: Self-pay | Admitting: Internal Medicine

## 2023-07-29 ENCOUNTER — Ambulatory Visit: Admitting: Nurse Practitioner

## 2023-07-29 ENCOUNTER — Other Ambulatory Visit (INDEPENDENT_AMBULATORY_CARE_PROVIDER_SITE_OTHER)

## 2023-07-29 ENCOUNTER — Encounter: Payer: Self-pay | Admitting: Nurse Practitioner

## 2023-07-29 VITALS — BP 150/68 | HR 68 | Ht 63.0 in | Wt 237.0 lb

## 2023-07-29 DIAGNOSIS — D649 Anemia, unspecified: Secondary | ICD-10-CM

## 2023-07-29 DIAGNOSIS — Z860101 Personal history of adenomatous and serrated colon polyps: Secondary | ICD-10-CM | POA: Diagnosis not present

## 2023-07-29 DIAGNOSIS — K5712 Diverticulitis of small intestine without perforation or abscess without bleeding: Secondary | ICD-10-CM

## 2023-07-29 DIAGNOSIS — R1030 Lower abdominal pain, unspecified: Secondary | ICD-10-CM | POA: Diagnosis not present

## 2023-07-29 DIAGNOSIS — R1010 Upper abdominal pain, unspecified: Secondary | ICD-10-CM

## 2023-07-29 LAB — COMPREHENSIVE METABOLIC PANEL
ALT: 15 U/L (ref 0–35)
AST: 14 U/L (ref 0–37)
Albumin: 4.2 g/dL (ref 3.5–5.2)
Alkaline Phosphatase: 67 U/L (ref 39–117)
BUN: 50 mg/dL — ABNORMAL HIGH (ref 6–23)
CO2: 25 meq/L (ref 19–32)
Calcium: 9.1 mg/dL (ref 8.4–10.5)
Chloride: 105 meq/L (ref 96–112)
Creatinine, Ser: 1.31 mg/dL — ABNORMAL HIGH (ref 0.40–1.20)
GFR: 41.33 mL/min — ABNORMAL LOW (ref >60.00)
Glucose, Bld: 84 mg/dL (ref 70–99)
Potassium: 4.6 meq/L (ref 3.5–5.1)
Sodium: 140 meq/L (ref 135–145)
Total Bilirubin: 0.3 mg/dL (ref 0.2–1.2)
Total Protein: 6.9 g/dL (ref 6.0–8.3)

## 2023-07-29 LAB — IBC + FERRITIN
Ferritin: 36.4 ng/mL (ref 10.0–291.0)
Iron: 85 ug/dL (ref 42–145)
Saturation Ratios: 22.7 % (ref 20.0–50.0)
TIBC: 373.8 ug/dL (ref 250.0–450.0)
Transferrin: 267 mg/dL (ref 212.0–360.0)

## 2023-07-29 LAB — CBC WITH DIFFERENTIAL/PLATELET
Basophils Absolute: 0.1 10*3/uL (ref 0.0–0.1)
Basophils Relative: 0.7 % (ref 0.0–3.0)
Eosinophils Absolute: 0.4 10*3/uL (ref 0.0–0.7)
Eosinophils Relative: 4.1 % (ref 0.0–5.0)
HCT: 33.3 % — ABNORMAL LOW (ref 36.0–46.0)
Hemoglobin: 11.2 g/dL — ABNORMAL LOW (ref 12.0–15.0)
Lymphocytes Relative: 16.1 % (ref 12.0–46.0)
Lymphs Abs: 1.6 10*3/uL (ref 0.7–4.0)
MCHC: 33.6 g/dL (ref 30.0–36.0)
MCV: 94 fl (ref 78.0–100.0)
Monocytes Absolute: 0.9 10*3/uL (ref 0.1–1.0)
Monocytes Relative: 8.8 % (ref 3.0–12.0)
Neutro Abs: 6.8 10*3/uL (ref 1.4–7.7)
Neutrophils Relative %: 70.3 % (ref 43.0–77.0)
Platelets: 256 10*3/uL (ref 150.0–400.0)
RBC: 3.54 Mil/uL — ABNORMAL LOW (ref 3.87–5.11)
RDW: 12.8 % (ref 11.5–15.5)
WBC: 9.6 10*3/uL (ref 4.0–10.5)

## 2023-07-29 LAB — B12 AND FOLATE PANEL
Folate: >25.2 ng/mL (ref >5.9)
Vitamin B-12: 687 pg/mL (ref 211–911)

## 2023-07-29 MED ORDER — METRONIDAZOLE 250 MG PO TABS: 250.0000 mg | ORAL_TABLET | Freq: Three times a day (TID) | ORAL | 0 refills | Status: AC

## 2023-07-29 NOTE — Patient Instructions (Signed)
 You have been scheduled for a CT scan of the abdomen and pelvis at [facility], 1st floor Radiology. You are scheduled on 08/02/23 at 12:00 pm. You should arrive 2 hours prior to your appointment time for registration. (Arrive by 9:45) Please make sure that you have nothing to eat or drink 4 hours prior to the test.  You may take any medications as prescribed with a small amount of water, if necessary. If you take any of the following medications: METFORMIN, GLUCOPHAGE, GLUCOVANCE, AVANDAMET, RIOMET, FORTAMET, ACTOPLUS MET, JANUMET, GLUMETZA or METAGLIP, you MAY be asked to HOLD this medication 48 hours AFTER the exam.   If you have any questions regarding your exam or if you need to reschedule, you may call [facility] at [phone] between the hours of 8:00 am and 5:00 pm, Monday-Friday.     Your provider has requested that you go to the basement level for lab work before leaving today. Press "B" on the elevator. The lab is located at the first door on the left as you exit the elevator.  Go to the Emergency room if you develop severe abdominal pain.  Due to recent changes in healthcare laws, you may see the results of your imaging and laboratory studies on MyChart before your provider has had a chance to review them.  We understand that in some cases there may be results that are confusing or concerning to you. Not all laboratory results come back in the same time frame and the provider may be waiting for multiple results in order to interpret others.  Please give [place] 48 hours in order for your provider to thoroughly review all the results before contacting the office for clarification of your results.   Thank you for trusting me with your gastrointestinal care!   Alcide Evener, CRNP
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
## 2023-08-02 ENCOUNTER — Encounter (HOSPITAL_COMMUNITY): Payer: Self-pay

## 2023-08-02 ENCOUNTER — Ambulatory Visit (HOSPITAL_COMMUNITY)
Admission: RE | Admit: 2023-08-02 | Discharge: 2023-08-02 | Disposition: A | Discharge status: Home / Self Care | Source: Ambulatory Visit | Attending: Nurse Practitioner | Admitting: Nurse Practitioner

## 2023-08-02 DIAGNOSIS — R1030 Lower abdominal pain, unspecified: Secondary | ICD-10-CM | POA: Diagnosis not present

## 2023-08-02 DIAGNOSIS — Z9049 Acquired absence of other specified parts of digestive tract: Secondary | ICD-10-CM | POA: Diagnosis not present

## 2023-08-02 DIAGNOSIS — R109 Unspecified abdominal pain: Secondary | ICD-10-CM | POA: Diagnosis not present

## 2023-08-02 DIAGNOSIS — R1010 Upper abdominal pain, unspecified: Secondary | ICD-10-CM | POA: Diagnosis not present

## 2023-08-02 MED ORDER — IOHEXOL 9 MG/ML PO SOLN
1000.0000 mL | ORAL | Status: AC
  Administered 2023-08-02: 1000 mL via ORAL

## 2023-08-02 MED ORDER — IOHEXOL 9 MG/ML PO SOLN: 500.0000 mL | ORAL | Status: DC

## 2023-08-02 MED ORDER — IOHEXOL 9 MG/ML PO SOLN
ORAL | Status: AC
  Filled 2023-08-02: qty 1000

## 2023-08-05 NOTE — Progress Notes (Signed)
 Addendum: Reviewed and agree with assessment and management plan. Asha Grumbine, Carie Caddy, MD

## 2023-08-06 ENCOUNTER — Ambulatory Visit: Admitting: Podiatry

## 2023-08-06 ENCOUNTER — Encounter: Payer: Self-pay | Admitting: Podiatry

## 2023-08-06 DIAGNOSIS — E1149 Type 2 diabetes mellitus with other diabetic neurological complication: Secondary | ICD-10-CM

## 2023-08-06 DIAGNOSIS — M79675 Pain in left toe(s): Secondary | ICD-10-CM

## 2023-08-06 DIAGNOSIS — S96911S Strain of unspecified muscle and tendon at ankle and foot level, right foot, sequela: Secondary | ICD-10-CM

## 2023-08-06 DIAGNOSIS — B351 Tinea unguium: Secondary | ICD-10-CM | POA: Diagnosis not present

## 2023-08-06 DIAGNOSIS — M79674 Pain in right toe(s): Secondary | ICD-10-CM | POA: Diagnosis not present

## 2023-08-06 DIAGNOSIS — L84 Corns and callosities: Secondary | ICD-10-CM

## 2023-08-07 NOTE — Progress Notes (Signed)
 Subjective: Chief Complaint  Patient presents with   Ankle Pain    RM#12 Patient here for follow up on right ankle states is doing better with use of gabapentin really seems to help.No other concerns at this present time.     73 year old female presents the office with above concerns.  She states that she still gets discomfort of the right ankle.  Does have a she was the patient takes the gabapentin.  She thinks it may have been little bit better.  She is not interested in physical therapy.    Her nails are also thickened elongated which she has difficulty trimming.  Does not report any open lesions.     Objective: AAO x3, NAD DP/PT pulses palpable bilaterally, CRT less than 3 seconds She has tenderness palpation on the right ankle mostly lateral aspect of the course the peroneal tendon.  There are some slight edema present but there is no erythema or warmth.  There is no area pinpoint tenderness. Anxiety reaction, dystrophic, elongated nails 1, 3, 4, 5 on the right and on the left fifth toenail.  No edema, erythema or signs of infection. Small preulcerative lesion noted on left second toe.  No open lesion.  No drainage or pus or signs of infection. No pain with calf compression, swelling, warmth, erythema  Assessment: Right ankle pain; symptomatic onychosis, preulcerative lesion  Plan:  Ankle injury, right -Again reviewed options.  She is not interested in physical therapy.  Symptoms seem to be doing a little better.  Continue ankle brace when she is on her foot a lot.  She can still use gabapentin.  Discussed range of motion exercises to do at home.  Icing daily  Symptomatic onychomycosis -Sharply debrided nails x 5 without any complications or bleeding.  Preulcerative lesion left fifth toe -Continue offloading.  Monitor for any signs or symptoms of infection or skin breakdown.  Return in about 9 weeks (around 10/08/2023) for right ankle pain; routine care.  Vivi Barrack  DPM

## 2023-08-10 ENCOUNTER — Other Ambulatory Visit: Payer: Self-pay | Admitting: Podiatry

## 2023-08-12 ENCOUNTER — Ambulatory Visit (INDEPENDENT_AMBULATORY_CARE_PROVIDER_SITE_OTHER): Payer: PPO

## 2023-08-20 ENCOUNTER — Encounter: Payer: Self-pay | Admitting: Podiatry

## 2023-08-20 ENCOUNTER — Other Ambulatory Visit: Payer: Self-pay | Admitting: Podiatry

## 2023-08-20 MED ORDER — DOXYCYCLINE HYCLATE 100 MG PO TABS: 100.0000 mg | ORAL_TABLET | Freq: Two times a day (BID) | ORAL | 0 refills | Status: DC

## 2023-08-21 NOTE — Telephone Encounter (Signed)
 What time do you want this patient to come in? Please advise?

## 2023-08-21 NOTE — Telephone Encounter (Signed)
 Appointment has been made per Dr, Clydia Dart. Patient aware and did not have any other questions.

## 2023-08-22 ENCOUNTER — Ambulatory Visit: Admitting: Podiatry

## 2023-08-22 ENCOUNTER — Encounter: Payer: Self-pay | Admitting: Podiatry

## 2023-08-22 DIAGNOSIS — L97511 Non-pressure chronic ulcer of other part of right foot limited to breakdown of skin: Secondary | ICD-10-CM | POA: Diagnosis not present

## 2023-08-22 DIAGNOSIS — L03031 Cellulitis of right toe: Secondary | ICD-10-CM | POA: Diagnosis not present

## 2023-08-23 ENCOUNTER — Encounter: Payer: Self-pay | Admitting: Podiatry

## 2023-08-24 ENCOUNTER — Other Ambulatory Visit: Payer: Self-pay | Admitting: Podiatry

## 2023-08-24 MED ORDER — MUPIROCIN 2 % EX OINT: 1.0000 | TOPICAL_OINTMENT | Freq: Two times a day (BID) | CUTANEOUS | 2 refills | Status: AC

## 2023-08-26 NOTE — Progress Notes (Signed)
 Subjective: Chief Complaint  Patient presents with   Foot Pain    RM#11 right foot skin has been pulled off and needs to be evaluated due to being diabetic.   73 year old female presents the office today with above concerns.  She states that she peeled off some skin of her right big toe.  She was started on antibiotics, doxycycline  which she is still taking.  Denies any purulence.  Objective: AAO x3, NAD DP/PT pulses palpable bilaterally, CRT less than 3 seconds On plantar aspect the right hallux is superficial granular wound present for the skin is, off.  Measures about 3 x 1.5 cm without any probing, undermining or tunneling.  There is some clear drainage there is no purulence.  Mild surrounding erythema without any ascending cellulitis.  No fluctuation or crepitation. No pain with calf compression, swelling, warmth, erythema  Assessment: Ulceration right hallux  Plan: -All treatment options discussed with the patient including all alternatives, risks, complications.  -Wound superficial but the skin is come off.  There is some localized edema and erythema noted continue antibiotic with doxycycline .  Wash the wound with soap and water, dry thoroughly at home.  She can use the mupirocin  ointment followed by dressing daily.  Discussed using a small amount of antibiotic ointment.  Elevation, surgical shoe for offloading. -Monitor for any clinical signs or symptoms of infection and directed to call the office immediately should any occur or go to the ER. -Patient encouraged to call the office with any questions, concerns, change in symptoms.   Charity Conch DPM

## 2023-08-28 NOTE — Telephone Encounter (Signed)
 Dottie/Linda, pls contact patient and instruct her to follow a FODMAP diet, take Gas X one tab po bid, if she is willing try Florastor probiotic one tab po bid x 2 to 4 weeks and continue Ibgard one po bide. Pls schedule her for a follow up appointment with Dr. Bridgett Camps or earlier appt with me if her symptoms worsen. THX.

## 2023-08-30 ENCOUNTER — Encounter: Payer: Self-pay | Admitting: Podiatry

## 2023-09-02 ENCOUNTER — Other Ambulatory Visit: Payer: Self-pay | Admitting: Podiatry

## 2023-09-02 NOTE — Progress Notes (Signed)
 Error

## 2023-09-02 NOTE — Telephone Encounter (Signed)
 I called patient back. Will send MRI for an over read. Also we can going to increase the gabapentin  dose to 200mg  in the morning and 200mg  at night. She tolerates the current dose well. If any side affects let me know. If no improvement to let me know.

## 2023-09-02 NOTE — Telephone Encounter (Signed)
 Can we send her MRI out for an over read? Thanks!

## 2023-09-09 ENCOUNTER — Other Ambulatory Visit: Payer: Self-pay | Admitting: Podiatry

## 2023-09-09 DIAGNOSIS — G4733 Obstructive sleep apnea (adult) (pediatric): Secondary | ICD-10-CM | POA: Diagnosis not present

## 2023-09-10 ENCOUNTER — Ambulatory Visit: Admitting: Podiatry

## 2023-09-10 DIAGNOSIS — E1149 Type 2 diabetes mellitus with other diabetic neurological complication: Secondary | ICD-10-CM

## 2023-09-10 DIAGNOSIS — M79671 Pain in right foot: Secondary | ICD-10-CM | POA: Diagnosis not present

## 2023-09-10 DIAGNOSIS — S96911S Strain of unspecified muscle and tendon at ankle and foot level, right foot, sequela: Secondary | ICD-10-CM

## 2023-09-10 DIAGNOSIS — M2141 Flat foot [pes planus] (acquired), right foot: Secondary | ICD-10-CM

## 2023-09-10 DIAGNOSIS — G8929 Other chronic pain: Secondary | ICD-10-CM | POA: Diagnosis not present

## 2023-09-10 DIAGNOSIS — M19071 Primary osteoarthritis, right ankle and foot: Secondary | ICD-10-CM | POA: Diagnosis not present

## 2023-09-10 MED ORDER — AMMONIUM LACTATE 12 % EX CREA: 1.0000 | TOPICAL_CREAM | CUTANEOUS | 0 refills | Status: AC | PRN

## 2023-09-10 NOTE — Progress Notes (Unsigned)
 Subjective: Chief Complaint  Patient presents with   Foot Pain    RM#11 Follow up on right and ankle pain wants to discuss MRI.    73 year old female presents the office today with above concerns.  States that she has increased her gabapentin  some but she has to space it out given side effects.  She states the pain is not significant what is what it was but was causing quite a bit of pain previously.  Does not report any open lesions.  Objective: AAO x3, NAD DP/PT pulses palpable bilaterally, CRT less than 3 seconds Sensation decreased with Semmes Weinstein monofilament. On plantar aspect the right hallux is preulcerative callus there is no skin breakdown noted today.  No surrounding erythema, ascending cellulitis.  No drainage or pus or signs of infection.  Bunions present as well as digital deformity.  Significant valgus present.  Tenderness mostly to the arch of the foot and along the rear foot area.  She gets some discomfort in the course the peroneal tendon.   No pain with calf compression, swelling, warmth, erythema  Assessment: History of bilateral foot ulcerations, flatfoot deformity significant arthritis, chronic foot pain  Plan: -All treatment options discussed with the patient including all alternatives, risks, complications.  -I can review the MRI with the patient as well as her previous x-rays.  I think her symptoms are multifactorial.  I think is coming probably from neuropathy we will continue gabapentin .  In the flatfoot as well as significant arthritis think she will benefit more custom bracing.  We could consider an AFO brace but I think she will tolerate more of a Mezzo brace. We will submit to [employer] for authorization -Monitor for any clinical signs or symptoms of infection and directed to call the office immediately should any occur or go to the ER.  Return in about 9 weeks (around 11/12/2023).  Charity Conch DPM

## 2023-09-18 ENCOUNTER — Other Ambulatory Visit: Payer: Self-pay | Admitting: Nephrology

## 2023-09-18 DIAGNOSIS — N1831 Chronic kidney disease, stage 3a: Secondary | ICD-10-CM

## 2023-09-18 DIAGNOSIS — I129 Hypertensive chronic kidney disease with stage 1 through stage 4 chronic kidney disease, or unspecified chronic kidney disease: Secondary | ICD-10-CM | POA: Diagnosis not present

## 2023-09-18 DIAGNOSIS — R809 Proteinuria, unspecified: Secondary | ICD-10-CM | POA: Diagnosis not present

## 2023-09-18 DIAGNOSIS — E1122 Type 2 diabetes mellitus with diabetic chronic kidney disease: Secondary | ICD-10-CM | POA: Diagnosis not present

## 2023-09-18 DIAGNOSIS — E7800 Pure hypercholesterolemia, unspecified: Secondary | ICD-10-CM | POA: Diagnosis not present

## 2023-09-23 ENCOUNTER — Other Ambulatory Visit: Payer: Self-pay | Admitting: Internal Medicine

## 2023-09-23 DIAGNOSIS — E1159 Type 2 diabetes mellitus with other circulatory complications: Secondary | ICD-10-CM

## 2023-09-25 ENCOUNTER — Ambulatory Visit
Admission: RE | Admit: 2023-09-25 | Discharge: 2023-09-25 | Disposition: A | Discharge status: Home / Self Care | Source: Ambulatory Visit | Attending: Nephrology | Admitting: Nephrology

## 2023-09-25 DIAGNOSIS — N1831 Chronic kidney disease, stage 3a: Secondary | ICD-10-CM

## 2023-09-25 DIAGNOSIS — N183 Chronic kidney disease, stage 3 unspecified: Secondary | ICD-10-CM | POA: Diagnosis not present

## 2023-09-26 DIAGNOSIS — M170 Bilateral primary osteoarthritis of knee: Secondary | ICD-10-CM | POA: Diagnosis not present

## 2023-10-03 DIAGNOSIS — M170 Bilateral primary osteoarthritis of knee: Secondary | ICD-10-CM | POA: Diagnosis not present

## 2023-10-07 DIAGNOSIS — Z79899 Other long term (current) drug therapy: Secondary | ICD-10-CM | POA: Diagnosis not present

## 2023-10-07 DIAGNOSIS — L400 Psoriasis vulgaris: Secondary | ICD-10-CM | POA: Diagnosis not present

## 2023-10-09 ENCOUNTER — Other Ambulatory Visit: Payer: Self-pay | Admitting: Podiatry

## 2023-10-10 ENCOUNTER — Ambulatory Visit: Admitting: Podiatry

## 2023-10-10 DIAGNOSIS — M1711 Unilateral primary osteoarthritis, right knee: Secondary | ICD-10-CM | POA: Diagnosis not present

## 2023-10-10 DIAGNOSIS — M1712 Unilateral primary osteoarthritis, left knee: Secondary | ICD-10-CM | POA: Diagnosis not present

## 2023-10-10 DIAGNOSIS — M170 Bilateral primary osteoarthritis of knee: Secondary | ICD-10-CM | POA: Diagnosis not present

## 2023-10-18 ENCOUNTER — Encounter: Payer: Self-pay | Admitting: Internal Medicine

## 2023-10-18 ENCOUNTER — Ambulatory Visit: Payer: PPO | Admitting: Internal Medicine

## 2023-10-18 VITALS — BP 138/70 | HR 77 | Ht 63.0 in | Wt 237.6 lb

## 2023-10-18 DIAGNOSIS — Z7984 Long term (current) use of oral hypoglycemic drugs: Secondary | ICD-10-CM | POA: Diagnosis not present

## 2023-10-18 DIAGNOSIS — E1165 Type 2 diabetes mellitus with hyperglycemia: Secondary | ICD-10-CM | POA: Diagnosis not present

## 2023-10-18 DIAGNOSIS — E66813 Obesity, class 3: Secondary | ICD-10-CM | POA: Diagnosis not present

## 2023-10-18 DIAGNOSIS — Z794 Long term (current) use of insulin: Secondary | ICD-10-CM | POA: Diagnosis not present

## 2023-10-18 DIAGNOSIS — E1159 Type 2 diabetes mellitus with other circulatory complications: Secondary | ICD-10-CM | POA: Diagnosis not present

## 2023-10-18 DIAGNOSIS — E785 Hyperlipidemia, unspecified: Secondary | ICD-10-CM

## 2023-10-18 LAB — POCT GLYCOSYLATED HEMOGLOBIN (HGB A1C): Hemoglobin A1C: 6.8 % — AB (ref 4.0–5.6)

## 2023-10-18 NOTE — Progress Notes (Signed)
 Patient ID: [name], female   DOB: [birth year], 73 y.o.   MRN: [id number]   HPI: [name] is a 73 y.o.-year-old female, initially referred by her PCP, Dr. Bernetta Brilliant, returning for f/u for DM2, dx in 1980s, insulin -dependent, controlled, with complications (PN - s/p B toe amputations 2/2 osteomyelitis, PAD, foot ulcer). She saw Dr. Christobal Craft previously. Last visit with me 4.5 months ago.  Interim history: No increased urination, blurry vision, nausea, chest pain. She does have pain in her knees and hips.  She walks with a cane.  She cannot exercise.  Reviewed her HbA1c levels: Lab Results  Component Value Date   HGBA1C 7.0 (A) 06/03/2023   HGBA1C 7.5 (A) 01/09/2023   HGBA1C 7.1 (A) 09/04/2022   HGBA1C 6.8 (A) 05/04/2022   HGBA1C 7.0 (A) 01/01/2022   HGBA1C 7.6 (A) 08/30/2021   HGBA1C 8.5 (A) 05/31/2021   HGBA1C 8.5 (A) 02/21/2021   HGBA1C 7.8 (H) 12/14/2020   HGBA1C 8.4 (A) 09/30/2020   HGBA1C 8.1 (A) 06/30/2020   HGBA1C 9.4 (A) 04/22/2020   HGBA1C 9.1 (A) 01/20/2020   HGBA1C 10.3 (A) 09/29/2019   HGBA1C 9.5 (A) 07/01/2019   HGBA1C 8.4 (A) 02/04/2019   HGBA1C 8.0 (A) 09/30/2018   HGBA1C 7.1 (A) 05/29/2018   HGBA1C 6.7 (A) 01/27/2018   HGBA1C 7.3 (A) 09/24/2017  09/30/2020: HbA1c calculated from fructosamine is much better, at 6.6%, correlating more with her blood sugars at home. 02/16/2016: 7.1% 06/2015: 6.6%  Pt is on a regimen of: - Jardiance  25 >> 10 mg daily in am - Glipizide  5 mg 2x a day before meals >>  5 mg before dinner - Tresiba  20 >> ... 52 >> 46 >> 54-56 units daily  - Novolog  7 to 10 >> ... 12-16 (20) >> 14-16 units before each meal  She stopped Ozempic  04/2019 due to GI symptoms.  We retried it in 09/2019 with same symptoms so he had to stop. She stopped Metformin  ER >> AP and diarrhea She was on Victoza >> rash. She tried Mounjaro  06/2023 >> AP.   We tried to get her a CGM and she was able to start this - discrepant nr., also a rash.  Pt checks her  sugars 3-4 times a day: - am: 98-183, 211 >> 120-160, 174 >> 115-160, 176 >> 130-165, 177 - 2h after b'fast: 113-139 >>  67, 125-160 >> 111-151 >> 138-150, 177 - before lunch:59, 127-146, 227 >> 92-162 >> 130-150, 266 >> 130-150, 160 - 2h after lunch:   77 >> 94-150 >> 91 >> 130-170 >> 125-150, 200 >> 130-150 - before dinner: 118-145, 160 >> 140-199 >> 170 >> 143-161, 173 >> 145-160 - 2h after dinner:  110-156, 290 >> n/c >> 120, 145-170, 210 >> 119-175 >> 140-194 - bedtime: 110-140 >> 145-177, 216 >> 181 >> 170 - nighttime: n/c Lowest sugar was 66 >> 59 >> 67 >> 94; she has hypoglycemia awareness in the 90s. Highest sugar was  420 (steroids) >> ... 227 >> 210 >> 266 >> 194.  Glucometer: One Touch Verio  -+ CKD -patient was referred to nephrology, now sees Dr. Christianne Cowper, last BUN/creatinine: Lab Results  Component Value Date   BUN 50 (H) 07/29/2023   BUN 46 (H) 06/03/2023   CREATININE 1.31 (H) 07/29/2023   CREATININE 1.25 (H) 06/03/2023  01/08/2023: 42/1.39, GFR 41, glucose 196 06/22/2022: 62/1.51, GFR 37, glucose 166  06/11/2023: ACR 820 (PCP's office) Lab Results  Component Value Date   MICRALBCREAT 1,500 (H)  06/03/2023   MICRALBCREAT 9.4 02/04/2019  On lisinopril .  -+ HL; last set of lipids: Lab Results  Component Value Date   CHOL 107 06/03/2023   HDL 33 (L) 06/03/2023   LDLCALC 47 06/03/2023   LDLDIRECT 44.0 02/04/2019   TRIG 206 (H) 06/03/2023   CHOLHDL 3.2 06/03/2023  06/07/2022: 90/212/28/29 On Zocor  40.  - last eye exam was 05/10/2022: No DR (Dr. Gennie Kicks >> will see Dr. Terrall Ferraris).  She has a history of cataract surgery.   -She has numbness and tingling in her feet.  She sees podiatry -Dr. Clydia Dart.  Latest foot exam was performed on 09/10/2023 (Dr. Clydia Dart). She had amputation of 1/2 of the left hallux 05/06/2017.  She had osteomyelitis of the right second toe and she is status post left partial fourth toe amputation on 06/28/2021.  She also has a history of HTN, GERD,  anemia. She started Humira (for psoriasis) but did not tolerate this >> stopped. She was in PT for her back pain. Also had dry needling.   ROS: + See HPI  I reviewed pt's medications, allergies, PMH, social hx, family hx, and changes were documented in the history of present illness. Otherwise, unchanged from my initial visit note.  Past Medical History:  Diagnosis Date   Allergy    seasonal   Anemia in chronic renal disease 06/05/2011   Anxiety    takes Ativan  tid   Asthma    has an inhaler prn   Chronic kidney disease    Cough    DDD (degenerative disc disease), cervical 06/05/2011   DDD (degenerative disc disease), lumbosacral 06/05/2011   Depression    takes Cymbalta  daily   Diabetes mellitus    takes Amaryl  and Metformin  daily   Diabetic neuropathy (HCC)    Diverticulosis    Dizziness    GERD (gastroesophageal reflux disease)    takes Protonix  prn   Headache(784.0)    occasionally   History of bronchitis    last time 22yrs ago   History of colon polyps    History of MRSA infection    HTN (hypertension) 06/05/2011   takes Prinizide daily   Hyperlipidemia    takes Zocor  daily   Insomnia    not on any meds at present time   Osteoarthritis    PONV (postoperative nausea and vomiting)    hard to wake up   Psoriasis 06/05/2011   on legs and uses cream prn   Seasonal allergies    but doesn't take any meds   Sleep apnea    uses CPAP   Tubular adenoma of colon    Past Surgical History:  Procedure Laterality Date   ABDOMINAL HYSTERECTOMY  1982 and 1984   AMPUTATION  02/12/2012   Procedure: AMPUTATION RAY;  Surgeon: Amada Backer, MD;  Location: [facility] OR;  Service: Orthopedics;  Laterality: Right;  RIGHT 2ND TOE AMPUTATION    ANTERIOR LAT LUMBAR FUSION N/A 12/22/2020   Procedure: Thoracic twelve to Lumbar one posterior lateral lumbar interbody fusion with pedicle screw fixation Thoracic ten to Lumbar two with robot, posterior arthrodesis with allograft from Thoracic ten  to Lumbar two;  Surgeon: Elna Haggis, MD;  Location: [facility] OR;  Service: Neurosurgery;  Laterality: N/A;   APPLICATION OF ROBOTIC ASSISTANCE FOR SPINAL PROCEDURE N/A 12/22/2020   Procedure: APPLICATION OF ROBOTIC ASSISTANCE FOR SPINAL PROCEDURE;  Surgeon: Elna Haggis, MD;  Location: [facility] OR;  Service: Neurosurgery;  Laterality: N/A;  3C/RM 19   BACK SURGERY  x 3   CARDIAC CATHETERIZATION     08/09/04: 50% mid AV groove CX lesion, NL LM, LAD, Ramus, EF > 60% (Dr. Katheryne Pane)   CARPAL TUNNEL RELEASE Bilateral 2010   CERVICAL SPINE SURGERY     CHOLECYSTECTOMY  1977   COLONOSCOPY  05/30/2009, 09/30/13   [facility] 2011/Dr.Brodie 2015   COLONOSCOPY W/ POLYPECTOMY  06/23/2019   DILATION AND CURETTAGE OF UTERUS     ELBOW SURGERY     left d/t tendonitis   ESOPHAGOGASTRODUODENOSCOPY     LUMBAR WOUND DEBRIDEMENT N/A 07/15/2012   Procedure: I & D of Lumbar Wound: Possible Repair of CSF Leak;  Surgeon: Ferris Hua, MD;  Location: [facility];  Service: Neurosurgery;  Laterality: N/A;  I & D of Lumbar Wound: Possible Repair of CSF Leak placement lumbar drain   POLYPECTOMY     right knee arthroscopy     x 2   SHOULDER ARTHROSCOPY Left    TOE AMPUTATION Bilateral    2nd toe   Social History   Social History   Marital status: Married    Spouse name: N/A   Number of children: 2   Occupational History   Disabled    Social History Main Topics   Smoking status: Never Smoker   Smokeless tobacco: Never Used     Comment: never used tobacco   Alcohol use No   Drug use: No   Current Outpatient Medications on File Prior to Visit  Medication Sig Dispense Refill   albuterol  (PROVENTIL  HFA;VENTOLIN  HFA) 108 (90 BASE) MCG/ACT inhaler Inhale 1 puff into the lungs every 4 (four) hours as needed for wheezing or shortness of breath.      ammonium lactate  (AMLACTIN) 12 % cream Apply 1 Application topically as needed for dry skin. 385 g 0   aspirin  81 MG tablet Take 81 mg by mouth daily.     budesonide-formoterol  (SYMBICORT) 160-4.5 MCG/ACT inhaler Inhale 1 puff into the lungs 2 (two) times daily as needed (shortness of breath).     Cholecalciferol (VITAMIN D) 50 MCG (2000 UT) tablet Take 2,000 Units by mouth daily.     COLLAGEN PO Take 2 capsules by mouth daily.     doxycycline  (VIBRA -TABS) 100 MG tablet Take 1 tablet (100 mg total) by mouth 2 (two) times daily. (Patient not taking: Reported on 09/10/2023) 20 tablet 0   gabapentin  (NEURONTIN ) 100 MG capsule TAKE 1 CAPSULE(100 MG) BY MOUTH AT BEDTIME 30 capsule 0   glipiZIDE  (GLUCOTROL ) 5 MG tablet Take 1 tablet (5 mg total) by mouth 2 (two) times daily before a meal. 180 tablet 3   insulin  degludec (TRESIBA  FLEXTOUCH) 200 UNIT/ML FlexTouch Pen ADMINISTER 60 UNITS UNDER THE SKIN DAILY at bedtime 18 mL 1   Insulin  Pen Needle (BD PEN NEEDLE MINI ULTRAFINE) 31G X 5 MM MISC USE AS DIRECTED FOR UP TO 4 NEEDLES TO INJECT INSULIN  400 each 3   Insulin  Syringe-Needle U-100 (ULTICARE INSULIN  SYRINGE) 31G X 5/16 1 ML MISC USE 3 SYRINGES DAILY TO INJECT MEALTIME INSULIN  300 each 3   Lancets (ONETOUCH ULTRASOFT) lancets      lisinopril -hydrochlorothiazide  (ZESTORETIC ) 20-12.5 MG tablet Take 2 tablets by mouth daily.     metoprolol  succinate (TOPROL -XL) 100 MG 24 hr tablet Take 100 mg by mouth daily. Take with or immediately following a meal.     Multiple Vitamins-Minerals (MULTIVITAMIN WITH MINERALS) tablet Take 1 tablet by mouth daily.      mupirocin  ointment (BACTROBAN ) 2 % Apply 1  Application topically 2 (two) times daily. 30 g 2   Needle, Disp, 32G X 5/16 MISC by Does not apply route.     NOVOLOG  100 UNIT/ML injection ADMINISTER 12 TO 20 UNITS UNDER THE SKIN THREE TIMES DAILY BEFORE MEALS 45 mL 3   ONETOUCH VERIO test strip USE AS DIRECTED TWICE DAILY 200 strip 3   pantoprazole  (PROTONIX ) 40 MG tablet Take 40 mg by mouth daily.     Probiotic Product (PROBIOTIC DAILY PO) Take 1 capsule by mouth daily.      simvastatin  (ZOCOR ) 40 MG tablet Take 40 mg by mouth daily  in the afternoon.     No current facility-administered medications on file prior to visit.   Allergies  Allergen Reactions   Victoza [Liraglutide] Hives and Rash   Hydromorphone  Hives, Diarrhea, Swelling and Rash   Ultram  [Tramadol ] Rash   Acetaminophen      Pt was told not to take   Betadine [Povidone Iodine] Other (See Comments)    Skin peels   Bydureon [Exenatide] Diarrhea and Nausea And Vomiting   Chlorhexidine      Exacerbates psoriasis    Codeine Other (See Comments)    Severe headaches   Contrast Media [Iodinated Contrast Media] Nausea And Vomiting   Fluoxetine Other (See Comments)   Humira [Adalimumab] Hives   Hydrocodone Other (See Comments)    headache   Invokana  [Canagliflozin ] Other (See Comments)    Cannot take this 2/2 PAD and toe amputations   Iodine Hives   Januvia [Sitagliptin] Diarrhea   Morphine And Codeine Nausea And Vomiting   Olanzapine Other (See Comments)   Ozempic  (0.25 Or 0.5 Mg-Dose) [Semaglutide (0.25 Or 0.5mg -Dos)] Nausea And Vomiting   Percocet [Oxycodone -Acetaminophen ] Other (See Comments)    Headache    Prednisone  Diarrhea and Nausea And Vomiting   Sulfa  Antibiotics Itching   Valium Other (See Comments)    headache   Vortioxetine Other (See Comments)   Amoxicillin  Hives and Rash    Did it involve swelling of the face/tongue/throat, SOB, or low BP? Unknown Did it involve sudden or severe rash/hives, skin peeling, or any reaction on the inside of your mouth or nose? Yes Did you need to seek medical attention at a hospital or doctor's office? Yes When did it last happen?      unk If all above answers are "NO", may proceed with cephalosporin use.    Darvocet [Propoxyphene N-Acetaminophen ] Rash and Other (See Comments)    headache   Fentanyl  And Related Rash   Wellbutrin [Bupropion Hcl] Hives and Rash   Family History  Problem Relation Age of Onset   Hypertension Mother        died from infection s/p hip surgery   Heart attack Father     Colon polyps Brother    Colon cancer Brother 40   Colon cancer Maternal Grandfather    Stomach cancer Neg Hx    Esophageal cancer Neg Hx    Rectal cancer Neg Hx    Liver cancer Neg Hx    PE: BP 138/70   Pulse 77   Ht 5' 3 (1.6 m)   Wt 237 lb 9.6 oz (107.8 kg)   SpO2 98%   BMI 42.09 kg/m  Wt Readings from Last 15 Encounters:  10/18/23 237 lb 9.6 oz (107.8 kg)  07/29/23 237 lb (107.5 kg)  07/25/23 230 lb (104.3 kg)  07/16/23 230 lb (104.3 kg)  06/03/23 241 lb 6.4 oz (109.5 kg)  01/09/23 243 lb 12.8 oz (110.6 kg)  09/07/22 240 lb (108.9 kg)  05/04/22 240 lb 9.6 oz (109.1 kg)  01/01/22 233 lb 9.6 oz (106 kg)  08/30/21 237 lb 3.2 oz (107.6 kg)  06/01/21 230 lb (104.3 kg)  05/31/21 237 lb 3.2 oz (107.6 kg)  04/18/21 230 lb (104.3 kg)  02/21/21 233 lb 6.4 oz (105.9 kg)  12/22/20 227 lb (103 kg)   Constitutional: overweight, in NAD, walks with a cane Eyes: no exophthalmos ENT: no masses palpated in neck, no cervical lymphadenopathy Cardiovascular: RRR, No MRG Respiratory: CTA B Musculoskeletal: no deformities Skin: no rashes Neurological: no tremor with outstretched hands  ASSESSMENT: 1. DM2, insulin -dependent, controlled, with complications: - PN - s/p B 2nd toe amputations 2/2 osteomyelitis - 2013 and 2015; 1/2 hallux 04/2017 - PAD  - CKD - foot ulcer  2. HL  3.  Obesity class II  PLAN:  1. Patient with longstanding, uncontrolled type 2 diabetes, on sulfonylurea and GLP-1 receptor agonist along with basal-bolus insulin , to which we tried to add a GLP-1/GIP receptor agonist at last visit, per her request.  However, she developed abdominal pain and had to stop.  She previously had abdominal pain with Ozempic , possibly due to diverticulitis.  At last visit, sugars appears to be improved and we discussed that if they continue to improve, to stop glipizide .  Latest HbA1c was 7.0% in 05/2023. -At today's visit, per review of her blood sugar logs, blood sugars appear to be  stable, still slightly above target in the morning, but with the HbA1c improving (see below), and in the context of trying to avoid low blood sugars, for now, I advised her to continue the current regimen.  At next visit, we may need to increase her Tresiba  dose slightly, if the sugars are above target before the holidays. -I advised her to: Patient Instructions  Please continue: - Jardiance  10 mg daily before b'fast - Glipizide  5 mg before dinner  - Novolog  14-16 units 15 min before a meal 3x a day (up to 20 units with steroids) - Tresiba  56 units at bedtime   Please come back for a follow-up appointment in 3-4 months.  - we checked her HbA1c: 6.8% (lower) - advised to check sugars at different times of the day - 4x a day, rotating check times - advised for yearly eye exams >> she is due-appointment coming up next month with Dr. Terrall Ferraris - return to clinic in 3-4 months  2. HL - Reviewed latest lipid panel from last visit: LDL at goal, triglycerides slightly high, HDL slightly low: Lab Results  Component Value Date   CHOL 107 06/03/2023   HDL 33 (L) 06/03/2023   LDLCALC 47 06/03/2023   LDLDIRECT 44.0 02/04/2019   TRIG 206 (H) 06/03/2023   CHOLHDL 3.2 06/03/2023  - :Will continue Zocor  40 mg daily-no side effects  3.  Obesity class III - She continues on the SGLT2 inhibitor without side effects.  She could not tolerate Mounjaro , which she wanted to try at last visit, due to abdominal pain (CT normal). - Unfortunately, she could not tolerate Ozempic  due to diverticulitis and abdominal pain - She was previously going to the weight management clinic-was on a 1400-calorie/day diet.  She came off the diet - She lost 4 pounds since last visit  Emilie Harden, MD PhD [facility] Endocrinology

## 2023-10-18 NOTE — Patient Instructions (Addendum)
 Please continue: - Jardiance  10 mg daily before b'fast - Glipizide  5 mg before dinner  - Novolog  14-16 units 15 min before a meal 3x a day (up to 20 units with steroids) - Tresiba  56 units at bedtime   Please come back for a follow-up appointment in 3-4 months.

## 2023-10-18 NOTE — Addendum Note (Signed)
 Addended by: Vernon Goodpasture on: 10/18/2023 04:12 PM   Modules accepted: Orders

## 2023-10-30 ENCOUNTER — Encounter: Payer: Self-pay | Admitting: Internal Medicine

## 2023-10-30 DIAGNOSIS — E1165 Type 2 diabetes mellitus with hyperglycemia: Secondary | ICD-10-CM

## 2023-10-31 MED ORDER — GLIPIZIDE 5 MG PO TABS: 5.0000 mg | ORAL_TABLET | Freq: Every day | ORAL | 3 refills | Status: DC

## 2023-11-18 ENCOUNTER — Ambulatory Visit: Admitting: Podiatry

## 2023-11-18 VITALS — Ht 63.0 in | Wt 237.6 lb

## 2023-11-18 DIAGNOSIS — M2141 Flat foot [pes planus] (acquired), right foot: Secondary | ICD-10-CM | POA: Diagnosis not present

## 2023-11-18 DIAGNOSIS — E1149 Type 2 diabetes mellitus with other diabetic neurological complication: Secondary | ICD-10-CM

## 2023-11-18 DIAGNOSIS — S96911S Strain of unspecified muscle and tendon at ankle and foot level, right foot, sequela: Secondary | ICD-10-CM

## 2023-11-18 DIAGNOSIS — M19071 Primary osteoarthritis, right ankle and foot: Secondary | ICD-10-CM | POA: Diagnosis not present

## 2023-11-18 NOTE — Progress Notes (Unsigned)
 Subjective: Chief Complaint  Patient presents with   Nail Problem    Rm 11 Patient is here for routine foot care  and nail trimming. Patient is also following-up on right heel pain. Patient states a burning sensation in the right heel. Patient is currently using brace for right heel pain.     73 year old female presents the office today with above concerns.  She states that she has been doing well she does not see any open lesions.  She is still getting pain to the ankle but no recent injury or changes.  She also has a sharp shooting pain at times.  Objective: AAO x3, NAD DP/PT pulses palpable bilaterally, CRT less than 3 seconds Sensation decreased with Semmes Weinstein monofilament. Not able to see any open lesions today.  There is digital deformities present.  Nails are mildly elongated bunions present as well as digital deformity.  Significant valgus present.  Tenderness mostly to the arch of the foot and along the rear foot area.  She gets some discomfort in the course the peroneal tendon.   No pain with calf compression, swelling, warmth, erythema  Assessment: History of bilateral foot ulcerations, flatfoot deformity significant arthritis, chronic foot pain  Plan: -All treatment options discussed.  Both conservative and surgical options have been discussed. -Continue with conservative management for now.  I do think that a Mezzo brace will be beneficial to help.  Think her symptoms are also multifactorial discussed other nerve issues I do think she is Musculoskeletal issues to her foot and ankle clinically as well as on MRI and I think this would be beneficial for the brace standpoint.  I also recommended to follow-up with her regards to her nerve issues from her back, hip that could be causing issues  Return in about 9 weeks (around 01/20/2024).  Donnice JONELLE Fees DPM  *faxed PA to [facility]

## 2023-11-27 ENCOUNTER — Encounter: Payer: Self-pay | Admitting: Podiatry

## 2023-11-28 ENCOUNTER — Other Ambulatory Visit: Payer: Self-pay | Admitting: Internal Medicine

## 2023-11-28 ENCOUNTER — Encounter: Payer: Self-pay | Admitting: Internal Medicine

## 2023-11-28 DIAGNOSIS — H52203 Unspecified astigmatism, bilateral: Secondary | ICD-10-CM | POA: Diagnosis not present

## 2023-11-28 DIAGNOSIS — E1165 Type 2 diabetes mellitus with hyperglycemia: Secondary | ICD-10-CM

## 2023-11-28 DIAGNOSIS — E119 Type 2 diabetes mellitus without complications: Secondary | ICD-10-CM | POA: Diagnosis not present

## 2023-11-28 DIAGNOSIS — H524 Presbyopia: Secondary | ICD-10-CM | POA: Diagnosis not present

## 2023-11-28 DIAGNOSIS — H04123 Dry eye syndrome of bilateral lacrimal glands: Secondary | ICD-10-CM | POA: Diagnosis not present

## 2023-11-28 DIAGNOSIS — Z961 Presence of intraocular lens: Secondary | ICD-10-CM | POA: Diagnosis not present

## 2023-11-28 LAB — OPHTHALMOLOGY REPORT-SCANNED

## 2023-12-09 DIAGNOSIS — N183 Chronic kidney disease, stage 3 unspecified: Secondary | ICD-10-CM | POA: Diagnosis not present

## 2023-12-09 DIAGNOSIS — E1129 Type 2 diabetes mellitus with other diabetic kidney complication: Secondary | ICD-10-CM | POA: Diagnosis not present

## 2023-12-09 DIAGNOSIS — G4733 Obstructive sleep apnea (adult) (pediatric): Secondary | ICD-10-CM | POA: Diagnosis not present

## 2023-12-09 DIAGNOSIS — E782 Mixed hyperlipidemia: Secondary | ICD-10-CM | POA: Diagnosis not present

## 2023-12-09 DIAGNOSIS — R011 Cardiac murmur, unspecified: Secondary | ICD-10-CM | POA: Diagnosis not present

## 2023-12-09 DIAGNOSIS — I129 Hypertensive chronic kidney disease with stage 1 through stage 4 chronic kidney disease, or unspecified chronic kidney disease: Secondary | ICD-10-CM | POA: Diagnosis not present

## 2023-12-09 DIAGNOSIS — R5383 Other fatigue: Secondary | ICD-10-CM | POA: Diagnosis not present

## 2023-12-09 DIAGNOSIS — M25559 Pain in unspecified hip: Secondary | ICD-10-CM | POA: Diagnosis not present

## 2023-12-12 ENCOUNTER — Ambulatory Visit
Admission: RE | Admit: 2023-12-12 | Discharge: 2023-12-12 | Disposition: A | Discharge status: Home / Self Care | Source: Ambulatory Visit

## 2023-12-12 VITALS — BP 137/71 | HR 68 | Temp 98.2°F | Resp 20 | Ht 63.0 in | Wt 233.0 lb

## 2023-12-12 DIAGNOSIS — E1129 Type 2 diabetes mellitus with other diabetic kidney complication: Secondary | ICD-10-CM | POA: Insufficient documentation

## 2023-12-12 DIAGNOSIS — H66002 Acute suppurative otitis media without spontaneous rupture of ear drum, left ear: Secondary | ICD-10-CM | POA: Diagnosis not present

## 2023-12-12 DIAGNOSIS — F330 Major depressive disorder, recurrent, mild: Secondary | ICD-10-CM | POA: Insufficient documentation

## 2023-12-12 DIAGNOSIS — J45909 Unspecified asthma, uncomplicated: Secondary | ICD-10-CM | POA: Insufficient documentation

## 2023-12-12 DIAGNOSIS — R011 Cardiac murmur, unspecified: Secondary | ICD-10-CM | POA: Insufficient documentation

## 2023-12-12 DIAGNOSIS — I700 Atherosclerosis of aorta: Secondary | ICD-10-CM | POA: Insufficient documentation

## 2023-12-12 MED ORDER — DOXYCYCLINE HYCLATE 100 MG PO CAPS: 100.0000 mg | ORAL_CAPSULE | Freq: Two times a day (BID) | ORAL | 0 refills | Status: AC

## 2023-12-12 NOTE — ED Provider Notes (Signed)
 [facility]    CSN: [id number] Arrival date & time: 12/12/23  1740      History   Chief Complaint Chief Complaint  Patient presents with   Ear Drainage    HPI [name] is a 73 y.o. female.   Patient here today for evaluation of purulent drainage and bleeding from her left ear that started a few days ago.  She reports she is also developed some sores to the outside of her mouth and tongue as well as in her nose.  She is not sure what this is developing from.  She has tried several things without improvement.  She does have tubes in her left ear.  The history is provided by the patient.  Ear Drainage Pertinent negatives include no abdominal pain and no shortness of breath.    Past Medical History:  Diagnosis Date   Allergy    seasonal   Anemia in chronic renal disease 06/05/2011   Anxiety    takes Ativan  tid   Asthma    has an inhaler prn   Chronic kidney disease    Cough    DDD (degenerative disc disease), cervical 06/05/2011   DDD (degenerative disc disease), lumbosacral 06/05/2011   Depression    takes Cymbalta  daily   Diabetes mellitus    takes Amaryl  and Metformin  daily   Diabetic neuropathy (HCC)    Diverticulosis    Dizziness    GERD (gastroesophageal reflux disease)    takes Protonix  prn   Headache(784.0)    occasionally   History of bronchitis    last time 44yrs ago   History of colon polyps    History of MRSA infection    HTN (hypertension) 06/05/2011   takes Prinizide daily   Hyperlipidemia    takes Zocor  daily   Insomnia    not on any meds at present time   Osteoarthritis    PONV (postoperative nausea and vomiting)    hard to wake up   Poorly controlled type 2 diabetes mellitus with circulatory disorder (HCC) 02/21/2021   Psoriasis 06/05/2011   on legs and uses cream prn   Seasonal allergies    but doesn't take any meds   Sleep apnea    uses CPAP   Tubular adenoma of colon    Uncontrolled type 2 diabetes mellitus with  peripheral neuropathy 05/10/2016    Patient Active Problem List   Diagnosis Date Noted   Hardening of the aorta (main artery of the heart) (HCC) 12/12/2023   Heart murmur 12/12/2023   Mild recurrent major depression (HCC) 12/12/2023   Asthma 12/12/2023   Type 2 diabetes mellitus with other diabetic kidney complication (HCC) 12/12/2023   Impacted cerumen of left ear 07/17/2023   Other specified disorders of eustachian tube, left ear 07/17/2023   Sensory hearing loss, bilateral 07/17/2023   Asthmatic bronchitis 07/21/2021   Diverticular disease of colon 07/21/2021   Hypocalcemia 07/21/2021   Osteoarthritis 07/21/2021   Gynecological disease 07/21/2021   Moderate episode of recurrent major depressive disorder (HCC) 07/21/2021   Vitamin D deficiency 07/21/2021   Thoracic disc disease with myelopathy 12/22/2020   Absence of toe of right foot (HCC) 11/22/2020   Lumbar discitis 10/26/2020   Body mass index (BMI) 40.0-44.9, adult (HCC) 10/07/2020   Chronic bilateral low back pain with left-sided sciatica 10/07/2020   Essential hypertension 05/09/2019   AKI (acute kidney injury) (HCC) 05/09/2019   Acquired absence of other right toe(s) (HCC) 03/11/2019   Stage 3 chronic  kidney disease (HCC) 03/11/2019   Absence of toe of left foot (HCC) 04/14/2018   Anemia 04/14/2018   Diverticulitis 04/14/2018   Diverticulosis 04/14/2018   Gastro-esophageal reflux disease without esophagitis 04/14/2018   Major depression 04/14/2018   Mild persistent asthma without complication 04/14/2018   Morbid obesity (HCC) 04/14/2018   Obstructive sleep apnea syndrome 04/14/2018   Polyneuropathy due to type 2 diabetes mellitus (HCC) 04/14/2018   Proteinuria 04/14/2018   Restless legs 04/14/2018   Chronic kidney disease due to hypertension 04/14/2018   Hyperlipidemia 01/24/2017   Onychomycosis 05/12/2014   Ulcer of right foot (HCC) 05/12/2014   Osteomyelitis of toe (HCC) 02/09/2012   Anemia in chronic renal  disease 06/05/2011   Anemia, iron deficiency 06/05/2011   Psoriasis 06/05/2011   DDD (degenerative disc disease), cervical 06/05/2011   DDD (degenerative disc disease), lumbosacral 06/05/2011   HTN (hypertension) 06/05/2011    Past Surgical History:  Procedure Laterality Date   ABDOMINAL HYSTERECTOMY  1982 and 1984   AMPUTATION  02/12/2012   Procedure: AMPUTATION RAY;  Surgeon: Norleen Armor, MD;  Location: [facility] OR;  Service: Orthopedics;  Laterality: Right;  RIGHT 2ND TOE AMPUTATION    ANTERIOR LAT LUMBAR FUSION N/A 12/22/2020   Procedure: Thoracic twelve to Lumbar one posterior lateral lumbar interbody fusion with pedicle screw fixation Thoracic ten to Lumbar two with robot, posterior arthrodesis with allograft from Thoracic ten to Lumbar two;  Surgeon: Colon Shove, MD;  Location: [facility];  Service: Neurosurgery;  Laterality: N/A;   APPLICATION OF ROBOTIC ASSISTANCE FOR SPINAL PROCEDURE N/A 12/22/2020   Procedure: APPLICATION OF ROBOTIC ASSISTANCE FOR SPINAL PROCEDURE;  Surgeon: Colon Shove, MD;  Location: [facility] OR;  Service: Neurosurgery;  Laterality: N/A;  3C/RM 19   BACK SURGERY     x 3   CARDIAC CATHETERIZATION     08/09/04: 50% mid AV groove CX lesion, NL LM, LAD, Ramus, EF > 60% (Dr. Court)   CARPAL TUNNEL RELEASE Bilateral 2010   CERVICAL SPINE SURGERY     CHOLECYSTECTOMY  1977   COLONOSCOPY  05/30/2009, 09/30/13   [facility] 2011/Dr.Brodie 2015   COLONOSCOPY W/ POLYPECTOMY  06/23/2019   DILATION AND CURETTAGE OF UTERUS     ELBOW SURGERY     left d/t tendonitis   ESOPHAGOGASTRODUODENOSCOPY     LUMBAR WOUND DEBRIDEMENT N/A 07/15/2012   Procedure: I & D of Lumbar Wound: Possible Repair of CSF Leak;  Surgeon: Arley SHAUNNA Helling, MD;  Location: [facility];  Service: Neurosurgery;  Laterality: N/A;  I & D of Lumbar Wound: Possible Repair of CSF Leak placement lumbar drain   POLYPECTOMY     right knee arthroscopy     x 2   SHOULDER ARTHROSCOPY Left    TOE AMPUTATION Bilateral    2nd toe     OB History   No obstetric history on file.      Home Medications    Prior to Admission medications   Medication Sig Start Date End Date Taking? Authorizing Provider  augmented betamethasone  dipropionate (DIPROLENE -AF) 0.05 % cream Apply topically 2 (two) times daily as needed. 09/24/23  Yes [provider]  doxycycline  (VIBRAMYCIN ) 100 MG capsule Take 1 capsule (100 mg total) by mouth 2 (two) times daily for 7 days. 12/12/23 12/19/23 Yes Billy Asberry FALCON, PA-C  JARDIANCE  10 MG TABS tablet Take 10 mg by mouth daily. 10/10/23  Yes [provider]  lisinopril  (ZESTRIL ) 20 MG tablet Take 20 mg by mouth daily. 09/18/23  Yes [provider]  ofloxacin  (FLOXIN ) 0.3 % OTIC solution SMARTSIG:In Ear(s) 12/11/23  Yes [provider]  risankizumab-rzaa (SKYRIZI) 150 MG/ML pen Inject 150 mg into the skin. 10/07/23  Yes [provider]  ammonium lactate  (AMLACTIN) 12 % cream Apply 1 Application topically as needed for dry skin. 09/10/23   Gershon Donnice SAUNDERS, DPM  aspirin  81 MG tablet Take 81 mg by mouth daily.    [provider]  budesonide-formoterol (SYMBICORT) 160-4.5 MCG/ACT inhaler Inhale 1 puff into the lungs 2 (two) times daily as needed (shortness of breath).    [provider]  Cholecalciferol (VITAMIN D) 50 MCG (2000 UT) tablet Take 2,000 Units by mouth daily.    [provider]  COLLAGEN PO Take 2 capsules by mouth daily.    [provider]  GORDAN DAN 300 MG/2ML SOAJ Subcutaneous    [provider]  DULoxetine  (CYMBALTA ) 30 MG capsule TAKE 2 CAPSULE BY MOUTH EVERY MORNING AND 1 CAPSULE BY MOUTH EVERY EVENING; Duration: 90    [provider]  gabapentin  (NEURONTIN ) 100 MG capsule TAKE 1 CAPSULE(100 MG) BY MOUTH AT BEDTIME 10/09/23   Gershon Donnice SAUNDERS, DPM  glipiZIDE  (GLUCOTROL ) 5 MG tablet Take 1 tablet (5 mg total) by mouth daily before supper. 10/31/23   Trixie File, MD  Insulin  Pen Needle  (BD PEN NEEDLE MINI ULTRAFINE) 31G X 5 MM MISC USE AS DIRECTED FOR UP TO 4 NEEDLES TO INJECT INSULIN  09/24/23   Trixie File, MD  Insulin  Syringe-Needle U-100 S[place]RIA INSULIN  SYRINGE) 31G X 5/16 1 ML MISC USE 3 SYRINGES DAILY TO INJECT MEALTIME INSULIN  05/24/23   Trixie File, MD  Lancets [facility] ULTRASOFT) lancets  06/24/20   [provider]  lisinopril -hydrochlorothiazide  (ZESTORETIC ) 20-12.5 MG tablet Take 2 tablets by mouth daily. 11/17/20   [provider]  metoprolol  succinate (TOPROL -XL) 100 MG 24 hr tablet Take 100 mg by mouth daily. Take with or immediately following a meal.    [provider]  mupirocin  ointment (BACTROBAN ) 2 % Apply 1 Application topically 2 (two) times daily. 08/24/23   Gershon Donnice SAUNDERS, DPM  Needle, Disp, 32G X 5/16 MISC by Does not apply route.    [provider]  NOVOLOG  100 UNIT/ML injection ADMINISTER 12 TO 20 UNITS UNDER THE SKIN THREE TIMES DAILY BEFORE MEALS 04/15/23   Trixie File, MD  [facility] VERIO test strip USE AS DIRECTED TWICE DAILY 09/27/22   Trixie File, MD  pantoprazole  (PROTONIX ) 40 MG tablet Take 40 mg by mouth daily.    [provider]  Probiotic Product (PROBIOTIC DAILY PO) Take 1 capsule by mouth daily.     [provider]  simvastatin  (ZOCOR ) 40 MG tablet Take 40 mg by mouth daily in the afternoon.    [provider]  TRESIBA  FLEXTOUCH 200 UNIT/ML FlexTouch Pen ADMINISTER 60 UNITS UNDER THE SKIN DAILY AT BEDTIME 11/29/23   Trixie File, MD    Family History Family History  Problem Relation Age of Onset   Hypertension Mother        died from infection s/p hip surgery   Heart attack Father    Colon polyps Brother    Colon cancer Brother 68   Colon cancer Maternal Grandfather    Stomach cancer Neg Hx    Esophageal cancer Neg Hx    Rectal cancer Neg Hx    Liver cancer Neg Hx     Social History Social History   Tobacco Use   Smoking status: Never  Smokeless tobacco: Never   Tobacco comments:    never used tobacco  Vaping Use   Vaping status: Never Used  Substance Use Topics   Alcohol use: Never   Drug use: Never     Allergies   Liraglutide, Hydromorphone , Tramadol , Acetaminophen , Adalimumab, Betadine [povidone iodine], Bupropion hcl, Chlorhexidine , Codeine, Diazepam, Exenatide, Fluoxetine, Hydrocodone, Hydrocodone-acetaminophen , Iodinated contrast media, Iodine, Morphine and codeine, Morphine sulfate, Olanzapine, Other, Oxycodone -acetaminophen , Ozempic  (0.25 or 0.5 mg-dose) [semaglutide (0.25 or 0.5mg -dos)], Percocet [oxycodone -acetaminophen ], Povidone-iodine, Prednisone , Sitagliptin, Sulfa  antibiotics, Valium, Vortioxetine, Amoxicillin , Canagliflozin , Darvocet [propoxyphene n-acetaminophen ], Fentanyl  and related, and Sulfamethoxazole -trimethoprim    Review of Systems Review of Systems  Constitutional:  Negative for chills and fever.  HENT:  Positive for ear discharge and ear pain. Negative for congestion and sore throat.   Eyes:  Negative for discharge and redness.  Respiratory:  Negative for cough and shortness of breath.   Gastrointestinal:  Negative for abdominal pain, nausea and vomiting.     Physical Exam Triage Vital Signs ED Triage Vitals  Encounter Vitals Group     BP --      Girls Systolic BP Percentile --      Girls Diastolic BP Percentile --      Boys Systolic BP Percentile --      Boys Diastolic BP Percentile --      Pulse --      Resp --      Temp --      Temp src --      SpO2 --      Weight 12/12/23 1805 233 lb (105.7 kg)     Height 12/12/23 1805 5' 3 (1.6 m)     Head Circumference --      Peak Flow --      Pain Score 12/12/23 1800 4     Pain Loc --      Pain Education --      Exclude from Growth Chart --    No data found.  Updated Vital Signs BP 137/71 (BP Location: Left Arm)   Pulse 68   Temp 98.2 F (36.8 C) (Oral)   Resp 20   Ht 5' 3 (1.6 m)   Wt 233 lb (105.7 kg)   SpO2 97%    BMI 41.27 kg/m   Visual Acuity Right Eye Distance:   Left Eye Distance:   Bilateral Distance:    Right Eye Near:   Left Eye Near:    Bilateral Near:     Physical Exam Vitals and nursing note reviewed.  Constitutional:      General: She is not in acute distress.    Appearance: Normal appearance. She is not ill-appearing.  HENT:     Head: Normocephalic and atraumatic.     Right Ear: Tympanic membrane normal.     Ears:     Comments: Left TM with PE tube present with minimal bloody purulent discharge noted    Nose: Nose normal. No congestion or rhinorrhea.     Mouth/Throat:     Mouth: Mucous membranes are moist.     Pharynx: Oropharynx is clear. No oropharyngeal exudate or posterior oropharyngeal erythema.     Comments: No lesions appreciated. Eyes:     Conjunctiva/sclera: Conjunctivae normal.  Cardiovascular:     Rate and Rhythm: Normal rate.  Pulmonary:     Effort: Pulmonary effort is normal. No respiratory distress.  Neurological:     Mental Status: She is alert.  Psychiatric:        Mood and Affect: Mood  normal.        Behavior: Behavior normal.        Thought Content: Thought content normal.      UC Treatments / Results  Labs (all labs ordered are listed, but only abnormal results are displayed) Labs Reviewed - No data to display  EKG   Radiology No results found.  Procedures Procedures (including critical care time)  Medications Ordered in UC Medications - No data to display  Initial Impression / Assessment and Plan / UC Course  I have reviewed the triage vital signs and the nursing notes.  Pertinent labs & imaging results that were available during my care of the patient were reviewed by me and considered in my medical decision making (see chart for details).    Will treat with doxycycline  to cover possible otitis media.  Encouraged follow-up if no gradual improvement of symptoms or with any further concerns.  Final Clinical Impressions(s) / UC  Diagnoses   Final diagnoses:  Non-recurrent acute suppurative otitis media of left ear without spontaneous rupture of tympanic membrane   Discharge Instructions   None    ED Prescriptions     Medication Sig Dispense Auth. Provider   doxycycline  (VIBRAMYCIN ) 100 MG capsule Take 1 capsule (100 mg total) by mouth 2 (two) times daily for 7 days. 14 capsule Billy Asberry FALCON, PA-C      PDMP not reviewed this encounter.   Billy Asberry FALCON, PA-C 12/12/23 [phone]

## 2023-12-12 NOTE — ED Triage Notes (Signed)
 I have had ear bleeding on monday the 4th. I have sores on my tongue , and and sores in my nose. I have tries several things, but not getting better. - Entered by patient

## 2023-12-16 ENCOUNTER — Ambulatory Visit (INDEPENDENT_AMBULATORY_CARE_PROVIDER_SITE_OTHER): Admitting: Otolaryngology

## 2023-12-20 ENCOUNTER — Ambulatory Visit

## 2023-12-20 NOTE — Progress Notes (Signed)
 Patient was present and scanned and measured for Right AFO.  Patient has HX of ankle injury has over pronation as well as OA pain and edema  Patient uses cane for ambulation  AFO order placed patient will be called when in for fitting Delivery   Prior auth is good until 02/18/24   Lolita Schultze Cped, CFo, CFm

## 2024-01-01 ENCOUNTER — Ambulatory Visit
Admission: EM | Admit: 2024-01-01 | Discharge: 2024-01-01 | Disposition: A | Discharge status: Home / Self Care | Attending: Physician Assistant | Admitting: Physician Assistant

## 2024-01-01 ENCOUNTER — Encounter: Payer: Self-pay | Admitting: Emergency Medicine

## 2024-01-01 DIAGNOSIS — H60502 Unspecified acute noninfective otitis externa, left ear: Secondary | ICD-10-CM

## 2024-01-01 MED ORDER — CIPROFLOXACIN-DEXAMETHASONE 0.3-0.1 % OT SUSP: 4.0000 [drp] | Freq: Two times a day (BID) | OTIC | 0 refills | Status: AC

## 2024-01-01 NOTE — ED Provider Notes (Signed)
 [facility]    CSN: [id number] Arrival date & time: 01/01/24  1037      History   Chief Complaint No chief complaint on file.   HPI [name] is a 73 y.o. female.   HPI  Past Medical History:  Diagnosis Date  . Allergy    seasonal  . Anemia in chronic renal disease 06/05/2011  . Anxiety    takes Ativan  tid  . Asthma    has an inhaler prn  . Chronic kidney disease   . Cough   . DDD (degenerative disc disease), cervical 06/05/2011  . DDD (degenerative disc disease), lumbosacral 06/05/2011  . Depression    takes Cymbalta  daily  . Diabetes mellitus    takes Amaryl  and Metformin  daily  . Diabetic neuropathy (HCC)   . Diverticulosis   . Dizziness   . GERD (gastroesophageal reflux disease)    takes Protonix  prn  . Headache(784.0)    occasionally  . History of bronchitis    last time 71yrs ago  . History of colon polyps   . History of MRSA infection   . HTN (hypertension) 06/05/2011   takes Prinizide daily  . Hyperlipidemia    takes Zocor  daily  . Insomnia    not on any meds at present time  . Osteoarthritis   . PONV (postoperative nausea and vomiting)    hard to wake up  . Poorly controlled type 2 diabetes mellitus with circulatory disorder (HCC) 02/21/2021  . Psoriasis 06/05/2011   on legs and uses cream prn  . Seasonal allergies    but doesn't take any meds  . Sleep apnea    uses CPAP  . Tubular adenoma of colon   . Uncontrolled type 2 diabetes mellitus with peripheral neuropathy 05/10/2016    Patient Active Problem List   Diagnosis Date Noted  . Hardening of the aorta (main artery of the heart) (HCC) 12/12/2023  . Heart murmur 12/12/2023  . Mild recurrent major depression (HCC) 12/12/2023  . Asthma 12/12/2023  . Type 2 diabetes mellitus with other diabetic kidney complication (HCC) 12/12/2023  . Impacted cerumen of left ear 07/17/2023  . Other specified disorders of eustachian tube, left ear 07/17/2023  . Sensory hearing loss,  bilateral 07/17/2023  . Asthmatic bronchitis 07/21/2021  . Diverticular disease of colon 07/21/2021  . Hypocalcemia 07/21/2021  . Osteoarthritis 07/21/2021  . Gynecological disease 07/21/2021  . Moderate episode of recurrent major depressive disorder (HCC) 07/21/2021  . Vitamin D deficiency 07/21/2021  . Thoracic disc disease with myelopathy 12/22/2020  . Absence of toe of right foot (HCC) 11/22/2020  . Lumbar discitis 10/26/2020  . Body mass index (BMI) 40.0-44.9, adult (HCC) 10/07/2020  . Chronic bilateral low back pain with left-sided sciatica 10/07/2020  . Essential hypertension 05/09/2019  . AKI (acute kidney injury) (HCC) 05/09/2019  . Acquired absence of other right toe(s) (HCC) 03/11/2019  . Stage 3 chronic kidney disease (HCC) 03/11/2019  . Absence of toe of left foot (HCC) 04/14/2018  . Anemia 04/14/2018  . Diverticulitis 04/14/2018  . Diverticulosis 04/14/2018  . Gastro-esophageal reflux disease without esophagitis 04/14/2018  . Major depression 04/14/2018  . Mild persistent asthma without complication 04/14/2018  . Morbid obesity (HCC) 04/14/2018  . Obstructive sleep apnea syndrome 04/14/2018  . Polyneuropathy due to type 2 diabetes mellitus (HCC) 04/14/2018  . Proteinuria 04/14/2018  . Restless legs 04/14/2018  . Chronic kidney disease due to hypertension 04/14/2018  . Hyperlipidemia 01/24/2017  . Onychomycosis 05/12/2014  .  Ulcer of right foot (HCC) 05/12/2014  . Osteomyelitis of toe (HCC) 02/09/2012  . Anemia in chronic renal disease 06/05/2011  . Anemia, iron deficiency 06/05/2011  . Psoriasis 06/05/2011  . DDD (degenerative disc disease), cervical 06/05/2011  . DDD (degenerative disc disease), lumbosacral 06/05/2011  . HTN (hypertension) 06/05/2011    Past Surgical History:  Procedure Laterality Date  . ABDOMINAL HYSTERECTOMY  1982 and 1984  . AMPUTATION  02/12/2012   Procedure: AMPUTATION RAY;  Surgeon: Norleen Armor, MD;  Location: [facility] OR;  Service:  Orthopedics;  Laterality: Right;  RIGHT 2ND TOE AMPUTATION   . ANTERIOR LAT LUMBAR FUSION N/A 12/22/2020   Procedure: Thoracic twelve to Lumbar one posterior lateral lumbar interbody fusion with pedicle screw fixation Thoracic ten to Lumbar two with robot, posterior arthrodesis with allograft from Thoracic ten to Lumbar two;  Surgeon: Colon Shove, MD;  Location: [facility] OR;  Service: Neurosurgery;  Laterality: N/A;  . APPLICATION OF ROBOTIC ASSISTANCE FOR SPINAL PROCEDURE N/A 12/22/2020   Procedure: APPLICATION OF ROBOTIC ASSISTANCE FOR SPINAL PROCEDURE;  Surgeon: Colon Shove, MD;  Location: [facility] OR;  Service: Neurosurgery;  Laterality: N/A;  3C/RM 19  . BACK SURGERY     x 3  . CARDIAC CATHETERIZATION     08/09/04: 50% mid AV groove CX lesion, NL LM, LAD, Ramus, EF > 60% (Dr. Court)  . CARPAL TUNNEL RELEASE Bilateral 2010  . CERVICAL SPINE SURGERY    . CHOLECYSTECTOMY  1977  . COLONOSCOPY  05/30/2009, 09/30/13   [facility] 2011/Dr.Brodie 2015  . COLONOSCOPY W/ POLYPECTOMY  06/23/2019  . DILATION AND CURETTAGE OF UTERUS    . ELBOW SURGERY     left d/t tendonitis  . ESOPHAGOGASTRODUODENOSCOPY    . LUMBAR WOUND DEBRIDEMENT N/A 07/15/2012   Procedure: I & D of Lumbar Wound: Possible Repair of CSF Leak;  Surgeon: Arley SHAUNNA Helling, MD;  Location: [facility];  Service: Neurosurgery;  Laterality: N/A;  I & D of Lumbar Wound: Possible Repair of CSF Leak placement lumbar drain  . POLYPECTOMY    . right knee arthroscopy     x 2  . SHOULDER ARTHROSCOPY Left   . TOE AMPUTATION Bilateral    2nd toe    OB History   No obstetric history on file.      Home Medications    Prior to Admission medications   Medication Sig Start Date End Date Taking? Authorizing Provider  ammonium lactate  (AMLACTIN) 12 % cream Apply 1 Application topically as needed for dry skin. 09/10/23   Gershon Donnice SAUNDERS, DPM  aspirin  81 MG tablet Take 81 mg by mouth daily.    [provider]  augmented betamethasone  dipropionate  (DIPROLENE -AF) 0.05 % cream Apply topically 2 (two) times daily as needed. 09/24/23   [provider]  budesonide-formoterol (SYMBICORT) 160-4.5 MCG/ACT inhaler Inhale 1 puff into the lungs 2 (two) times daily as needed (shortness of breath).    [provider]  Cholecalciferol (VITAMIN D) 50 MCG (2000 UT) tablet Take 2,000 Units by mouth daily.    [provider]  COLLAGEN PO Take 2 capsules by mouth daily.    [provider]  GORDAN DAN 300 MG/2ML SOAJ Subcutaneous    [provider]  DULoxetine  (CYMBALTA ) 30 MG capsule TAKE 2 CAPSULE BY MOUTH EVERY MORNING AND 1 CAPSULE BY MOUTH EVERY EVENING; Duration: 90    [provider]  gabapentin  (NEURONTIN ) 100 MG capsule TAKE 1 CAPSULE(100 MG) BY MOUTH AT BEDTIME 10/09/23  Gershon Donnice SAUNDERS, DPM  glipiZIDE  (GLUCOTROL ) 5 MG tablet Take 1 tablet (5 mg total) by mouth daily before supper. 10/31/23   Trixie File, MD  Insulin  Pen Needle (BD PEN NEEDLE MINI ULTRAFINE) 31G X 5 MM MISC USE AS DIRECTED FOR UP TO 4 NEEDLES TO INJECT INSULIN  09/24/23   Trixie File, MD  Insulin  Syringe-Needle U-100 (ULTICARE INSULIN  SYRINGE) 31G X 5/16 1 ML MISC USE 3 SYRINGES DAILY TO INJECT MEALTIME INSULIN  05/24/23   Trixie File, MD  JARDIANCE  10 MG TABS tablet Take 10 mg by mouth daily. 10/10/23   [provider]  Lancets JANETT ULTRASOFT) lancets  06/24/20   [provider]  lisinopril  (ZESTRIL ) 20 MG tablet Take 20 mg by mouth daily. 09/18/23   [provider]  lisinopril -hydrochlorothiazide  (ZESTORETIC ) 20-12.5 MG tablet Take 2 tablets by mouth daily. 11/17/20   [provider]  metoprolol  succinate (TOPROL -XL) 100 MG 24 hr tablet Take 100 mg by mouth daily. Take with or immediately following a meal.    [provider]  mupirocin  ointment (BACTROBAN ) 2 % Apply 1 Application topically 2 (two) times daily. 08/24/23   Gershon Donnice SAUNDERS, DPM  Needle, Disp, 32G X  5/16 MISC by Does not apply route.    [provider]  NOVOLOG  100 UNIT/ML injection ADMINISTER 12 TO 20 UNITS UNDER THE SKIN THREE TIMES DAILY BEFORE MEALS 04/15/23   Trixie File, MD  ofloxacin  (FLOXIN ) 0.3 % OTIC solution SMARTSIG:In Ear(s) 12/11/23   [provider]  [facility] VERIO test strip USE AS DIRECTED TWICE DAILY 09/27/22   Trixie File, MD  pantoprazole  (PROTONIX ) 40 MG tablet Take 40 mg by mouth daily.    [provider]  Probiotic Product (PROBIOTIC DAILY PO) Take 1 capsule by mouth daily.     [provider]  risankizumab-rzaa [facility]) 150 MG/ML pen Inject 150 mg into the skin. 10/07/23   [provider]  simvastatin  (ZOCOR ) 40 MG tablet Take 40 mg by mouth daily in the afternoon.    [provider]  TRESIBA  FLEXTOUCH 200 UNIT/ML FlexTouch Pen ADMINISTER 60 UNITS UNDER THE SKIN DAILY AT BEDTIME 11/29/23   Trixie File, MD    Family History Family History  Problem Relation Age of Onset  . Hypertension Mother        died from infection s/p hip surgery  . Heart attack Father   . Colon polyps Brother   . Colon cancer Brother 50  . Colon cancer Maternal Grandfather   . Stomach cancer Neg Hx   . Esophageal cancer Neg Hx   . Rectal cancer Neg Hx   . Liver cancer Neg Hx     Social History Social History   Tobacco Use  . Smoking status: Never  . Smokeless tobacco: Never  . Tobacco comments:    never used tobacco  Vaping Use  . Vaping status: Never Used  Substance Use Topics  . Alcohol use: Never  . Drug use: Never     Allergies   Liraglutide, Hydromorphone , Tramadol , Acetaminophen , Adalimumab, Betadine [povidone iodine], Bupropion hcl, Chlorhexidine , Codeine, Diazepam, Exenatide, Fluoxetine, Hydrocodone, Hydrocodone-acetaminophen , Iodinated contrast media, Iodine, Morphine and codeine, Morphine sulfate, Olanzapine, Other, Oxycodone -acetaminophen , Ozempic  (0.25 or 0.5 mg-dose) [semaglutide (0.25 or  0.5mg -dos)], Percocet [oxycodone -acetaminophen ], Povidone-iodine, Prednisone , Sitagliptin, Sulfa  antibiotics, Valium, Vortioxetine, Amoxicillin , Canagliflozin , Darvocet [propoxyphene n-acetaminophen ], Fentanyl  and related, and Sulfamethoxazole -trimethoprim    Review of Systems Review of Systems  Constitutional:  Negative for chills and fever.  HENT:  Positive for ear discharge and ear pain.  Eyes:  Negative for discharge and redness.  Gastrointestinal:  Negative for abdominal pain, nausea and vomiting.     Physical Exam Triage Vital Signs ED Triage Vitals  Encounter Vitals Group     BP      Girls Systolic BP Percentile      Girls Diastolic BP Percentile      Boys Systolic BP Percentile      Boys Diastolic BP Percentile      Pulse      Resp      Temp      Temp src      SpO2      Weight      Height      Head Circumference      Peak Flow      Pain Score      Pain Loc      Pain Education      Exclude from Growth Chart    No data found.  Updated Vital Signs There were no vitals taken for this visit.  Visual Acuity Right Eye Distance:   Left Eye Distance:   Bilateral Distance:    Right Eye Near:   Left Eye Near:    Bilateral Near:     Physical Exam Vitals and nursing note reviewed.  Constitutional:      General: She is not in acute distress.    Appearance: Normal appearance. She is not ill-appearing.  HENT:     Head: Normocephalic and atraumatic.  Eyes:     Conjunctiva/sclera: Conjunctivae normal.  Cardiovascular:     Rate and Rhythm: Normal rate.  Pulmonary:     Effort: Pulmonary effort is normal.  Neurological:     Mental Status: She is alert.  Psychiatric:        Mood and Affect: Mood normal.        Behavior: Behavior normal.        Thought Content: Thought content normal.      UC Treatments / Results  Labs (all labs ordered are listed, but only abnormal results are displayed) Labs Reviewed - No data to display  EKG   Radiology No results  found.  Procedures Procedures (including critical care time)  Medications Ordered in UC Medications - No data to display  Initial Impression / Assessment and Plan / UC Course  I have reviewed the triage vital signs and the nursing notes.  Pertinent labs & imaging results that were available during my care of the patient were reviewed by me and considered in my medical decision making (see chart for details).     *** Final Clinical Impressions(s) / UC Diagnoses   Final diagnoses:  None   Discharge Instructions   None    ED Prescriptions   None    PDMP not reviewed this encounter.

## 2024-01-01 NOTE — ED Triage Notes (Signed)
 Pt st's she was seen here on 12/12/23 for left ear infection   Pt st's she was given antibiotics and the infection subsided.  Pt st's left ear pain and drainage returned two days ago

## 2024-01-07 ENCOUNTER — Other Ambulatory Visit: Payer: Self-pay | Admitting: Podiatry

## 2024-01-08 ENCOUNTER — Encounter (INDEPENDENT_AMBULATORY_CARE_PROVIDER_SITE_OTHER): Payer: Self-pay | Admitting: Otolaryngology

## 2024-01-08 ENCOUNTER — Ambulatory Visit (INDEPENDENT_AMBULATORY_CARE_PROVIDER_SITE_OTHER): Admitting: Otolaryngology

## 2024-01-08 VITALS — BP 166/80 | HR 72

## 2024-01-08 DIAGNOSIS — H6692 Otitis media, unspecified, left ear: Secondary | ICD-10-CM

## 2024-01-08 DIAGNOSIS — H6982 Other specified disorders of Eustachian tube, left ear: Secondary | ICD-10-CM | POA: Diagnosis not present

## 2024-01-08 DIAGNOSIS — H66015 Acute suppurative otitis media with spontaneous rupture of ear drum, recurrent, left ear: Secondary | ICD-10-CM

## 2024-01-09 DIAGNOSIS — H66012 Acute suppurative otitis media with spontaneous rupture of ear drum, left ear: Secondary | ICD-10-CM | POA: Insufficient documentation

## 2024-01-09 NOTE — Progress Notes (Signed)
 Patient ID: [name], female   DOB: 07-14-1950, 73 y.o.   MRN: [id number]  CC: Left ear infection  HPI: The patient is a 73 year old female who presents today complaining of bloody otorrhea from the left ear for the past month.  The patient has a history of left ear eustachian tube dysfunction, and was treated with left myringotomy and tube placement in March 2024.  According to the patient, she was doing well until 1 month ago, when she started noticing bloody drainage from the left ear.  She was treated with doxycycline  and antibiotic eardrops without improvement in her symptoms.  Currently she denies any otalgia or vertigo.  Exam: General: Communicates without difficulty, well nourished, no acute distress. Head: Normocephalic, no evidence injury, no tenderness, facial buttresses intact without stepoff. Face/sinus: No tenderness to palpation and percussion. Facial movement is normal and symmetric. Eyes: PERRL, EOMI. No scleral icterus, conjunctivae clear. Neuro: CN II exam reveals vision grossly intact.  No nystagmus at any point of gaze. Ears: Auricles well formed without lesions.  Purulent drainage is noted within the left ear canal.  Polypoid tissue is noted to partially cover the left tympanic membrane.  Under the operating microscope, the left ear canal is debrided with a suction catheter.  The right ear canal and tympanic membrane are normal.  Nose: External evaluation reveals normal support and skin without lesions.  Dorsum is intact.  Anterior rhinoscopy reveals congested mucosa over anterior aspect of inferior turbinates and intact septum.  No purulence noted. Oral:  Oral cavity and oropharynx are intact, symmetric, without erythema or edema.  Mucosa is moist without lesions. Neck: Full range of motion without pain.  There is no significant lymphadenopathy.  No masses palpable.  Thyroid bed within normal limits to palpation.  Parotid glands and submandibular glands equal bilaterally without  mass.  Trachea is midline. Neuro:  CN 2-12 grossly intact.   Assessment: 1.  Acute left otitis media with purulent otorrhea.  Polypoid tissue is noted to partially cover the left tympanic membrane. 2.  The right ear canal and tympanic membrane are normal. 3.  Left ear eustachian tube dysfunction.  Plan: 1.  The physical exam findings are reviewed with the patient. 2.  Otomicroscopy with debridement of the left ear canal. 3.  Ciprodex  eardrops 4 drops left ear twice daily for 2 weeks. 4.  The patient will return for reevaluation in 2 weeks.

## 2024-01-13 DIAGNOSIS — L400 Psoriasis vulgaris: Secondary | ICD-10-CM | POA: Diagnosis not present

## 2024-01-13 DIAGNOSIS — L820 Inflamed seborrheic keratosis: Secondary | ICD-10-CM | POA: Diagnosis not present

## 2024-01-13 DIAGNOSIS — Z79899 Other long term (current) drug therapy: Secondary | ICD-10-CM | POA: Diagnosis not present

## 2024-01-20 ENCOUNTER — Ambulatory Visit (INDEPENDENT_AMBULATORY_CARE_PROVIDER_SITE_OTHER)

## 2024-01-20 ENCOUNTER — Ambulatory Visit (INDEPENDENT_AMBULATORY_CARE_PROVIDER_SITE_OTHER): Admitting: Podiatry

## 2024-01-20 DIAGNOSIS — E1149 Type 2 diabetes mellitus with other diabetic neurological complication: Secondary | ICD-10-CM | POA: Diagnosis not present

## 2024-01-20 DIAGNOSIS — M19071 Primary osteoarthritis, right ankle and foot: Secondary | ICD-10-CM

## 2024-01-20 DIAGNOSIS — M2141 Flat foot [pes planus] (acquired), right foot: Secondary | ICD-10-CM | POA: Diagnosis not present

## 2024-01-20 DIAGNOSIS — M79671 Pain in right foot: Secondary | ICD-10-CM

## 2024-01-20 DIAGNOSIS — G8929 Other chronic pain: Secondary | ICD-10-CM

## 2024-01-20 DIAGNOSIS — M79675 Pain in left toe(s): Secondary | ICD-10-CM | POA: Diagnosis not present

## 2024-01-20 DIAGNOSIS — B351 Tinea unguium: Secondary | ICD-10-CM

## 2024-01-20 DIAGNOSIS — S96911S Strain of unspecified muscle and tendon at ankle and foot level, right foot, sequela: Secondary | ICD-10-CM | POA: Diagnosis not present

## 2024-01-20 DIAGNOSIS — M79674 Pain in right toe(s): Secondary | ICD-10-CM | POA: Diagnosis not present

## 2024-01-20 NOTE — Progress Notes (Signed)
 Patient presents today to pick up custom molded [place]  AFO, diagnosed with OA right ankle   Orthosis was dispensed and fit was good patient was very happy with function. Brace adds support and stability to ankle and will also help to slow progression of ankle deformity.  Patient reviewed with me instructions for break-in and wear. Written instructions given to patient.  Patient will follow up as needed.   Lolita Schultze Cped, CFo, CFm

## 2024-01-20 NOTE — Progress Notes (Unsigned)
 Subjective: Chief Complaint  Patient presents with   Foot Pain    Right foot follow up pt stated that she is doing okay she stated that her foot still bothers her some    73 year old female presents the office today with above concerns.  She states that she has been improving regards to the ankle pain.  She still gets some discomfort at times but not as significant as when it has been previously.  She also presents today to pick up her brace.   She does not report any open lesions.  No recent injuries  Objective: AAO x3, NAD DP/PT pulses palpable bilaterally, CRT less than 3 seconds Sensation decreased with Semmes Weinstein monofilament. Not able to see any open lesions today.  There is digital deformities present.  Toenails are hypertrophic, dystrophic, elongated with yellow discoloration to nails x 4 on the right foot.  There are no open lesions identified. Dry skin is present without any open lesions or skin fissures today. Mild discomfort along the lateral aspect the right ankle in the course of the peroneal tendon.  Some slight edema is present there is no area of pinpoint tenderness.  Ankle, subtalar joint range of motion intact. No pain with calf compression, swelling, warmth, erythema  Assessment: Flatfoot deformity significant arthritis, chronic foot pain; symptomatic onychosis  Plan: Right ankle pain -Continue with conservative management for now.  I do think that a [place] brace will be beneficial to help.  She was seen today by Lolita, pedorthist for this today.  Discussed range of motion exercises to be performed at home regularly. - I also think some of her symptoms could be due to neuropathy.  Continue gabapentin   Symptomatic onychomycosis - Sharply debrided nails x 4 on the right foot any complications or bleeding  No follow-ups on file.  Donnice JONELLE Fees DPM

## 2024-01-21 ENCOUNTER — Other Ambulatory Visit: Payer: Self-pay | Admitting: Internal Medicine

## 2024-01-22 ENCOUNTER — Encounter (INDEPENDENT_AMBULATORY_CARE_PROVIDER_SITE_OTHER): Payer: Self-pay | Admitting: Otolaryngology

## 2024-01-22 ENCOUNTER — Ambulatory Visit (INDEPENDENT_AMBULATORY_CARE_PROVIDER_SITE_OTHER): Admitting: Otolaryngology

## 2024-01-22 VITALS — BP 169/65 | HR 76 | Temp 97.7°F

## 2024-01-22 DIAGNOSIS — H66015 Acute suppurative otitis media with spontaneous rupture of ear drum, recurrent, left ear: Secondary | ICD-10-CM

## 2024-01-22 DIAGNOSIS — H6692 Otitis media, unspecified, left ear: Secondary | ICD-10-CM | POA: Diagnosis not present

## 2024-01-22 DIAGNOSIS — H6982 Other specified disorders of Eustachian tube, left ear: Secondary | ICD-10-CM | POA: Diagnosis not present

## 2024-01-23 NOTE — Progress Notes (Signed)
 Patient ID: [name], female   DOB: 1950/08/22, 73 y.o.   MRN: [id number]  Follow-up: Left ear infection  HPI: The patient is a 73 year old female who returns today for her follow-up evaluation.  The patient was last seen 2 weeks ago.  At that time, she was complaining of bloody otorrhea from the left ear.  She was noted to have an acute left otitis media.  Polypoid tissue was noted to partially cover the left tympanic membrane.  The patient was treated with debridement and Ciprodex  eardrops.  According to the patient, she is still having a small amount of left ear drainage.  The severity has decreased.  Currently she denies any otalgia or vertigo.  Exam: General: Communicates without difficulty, well nourished, no acute distress. Head: Normocephalic, no evidence injury, no tenderness, facial buttresses intact without stepoff. Face/sinus: No tenderness to palpation and percussion. Facial movement is normal and symmetric. Eyes: PERRL, EOMI. No scleral icterus, conjunctivae clear. Neuro: CN II exam reveals vision grossly intact.  No nystagmus at any point of gaze. Ears: Auricles well formed without lesions.  A small amount of drainage is noted within the left ear canal.  Under the operating microscope, the left ear canal is debrided with a suction catheter.  The left tympanic membrane is noted to be intact.  The right ear canal and tympanic membrane are normal.  Nose: External evaluation reveals normal support and skin without lesions.  Dorsum is intact.  Anterior rhinoscopy reveals congested mucosa over anterior aspect of inferior turbinates and intact septum.  No purulence noted. Oral:  Oral cavity and oropharynx are intact, symmetric, without erythema or edema.  Mucosa is moist without lesions. Neck: Full range of motion without pain.  There is no significant lymphadenopathy.  No masses palpable.  Thyroid  bed within normal limits to palpation.  Parotid glands and submandibular glands equal bilaterally  without mass.  Trachea is midline. Neuro:  CN 2-12 grossly intact.    Assessment: 1.  The patient has acute left otitis media has improved.  Only a small amount of drainage is noted within the ear canal.  No polypoid tissue is noted today. 2.  The right ear canal and tympanic membrane are normal. 3.  Left ear eustachian tube dysfunction.  Plan: 1.  The physical exam findings are reviewed with the patient. 2.  Otomicroscopy with debridement of the left ear canal. 3.  Continue Ciprodex  eardrops for 5 additional days. 4.  The patient will return for reevaluation in 6 months, sooner if needed.

## 2024-01-31 ENCOUNTER — Ambulatory Visit (INDEPENDENT_AMBULATORY_CARE_PROVIDER_SITE_OTHER): Admitting: Otolaryngology

## 2024-02-04 ENCOUNTER — Encounter: Payer: Self-pay | Admitting: Internal Medicine

## 2024-02-04 ENCOUNTER — Ambulatory Visit: Admitting: Internal Medicine

## 2024-02-04 VITALS — BP 146/82 | HR 70 | Wt 235.0 lb

## 2024-02-04 DIAGNOSIS — E66813 Obesity, class 3: Secondary | ICD-10-CM

## 2024-02-04 DIAGNOSIS — Z23 Encounter for immunization: Secondary | ICD-10-CM

## 2024-02-04 DIAGNOSIS — E1159 Type 2 diabetes mellitus with other circulatory complications: Secondary | ICD-10-CM

## 2024-02-04 DIAGNOSIS — E785 Hyperlipidemia, unspecified: Secondary | ICD-10-CM | POA: Diagnosis not present

## 2024-02-04 DIAGNOSIS — E1165 Type 2 diabetes mellitus with hyperglycemia: Secondary | ICD-10-CM

## 2024-02-04 LAB — POCT GLYCOSYLATED HEMOGLOBIN (HGB A1C): Hemoglobin A1C: 6.9 % — AB (ref 4.0–5.6)

## 2024-02-04 MED ORDER — NOVOLOG FLEXPEN 100 UNIT/ML ~~LOC~~ SOPN: 12.0000 [IU] | PEN_INJECTOR | Freq: Three times a day (TID) | SUBCUTANEOUS | 3 refills | Status: AC

## 2024-02-04 MED ORDER — JARDIANCE 10 MG PO TABS: 10.0000 mg | ORAL_TABLET | Freq: Every day | ORAL | 3 refills | Status: AC

## 2024-02-04 MED ORDER — TRESIBA FLEXTOUCH 200 UNIT/ML ~~LOC~~ SOPN: PEN_INJECTOR | SUBCUTANEOUS | 3 refills | Status: AC

## 2024-02-04 NOTE — Patient Instructions (Addendum)
 Please continue: - Jardiance  10 mg daily before b'fast - Glipizide  5 mg before dinner  - Novolog  14-16 units 15 min before a meal 3x a day (up to 20 units with steroids)  Please reduce: - Tresiba  50 units at bedtime    Please come back for a follow-up appointment in 3-4 months.

## 2024-02-04 NOTE — Progress Notes (Signed)
 Patient ID: [name], female   DOB: 08-01-1950, 73 y.o.   MRN: [id number]   HPI: [name] is a 73 y.o.-year-old female, initially referred by her PCP, Dr. Loreli, returning for f/u for DM2, dx in 1980s, insulin -dependent, controlled, with complications (PN - s/p B toe amputations 2/2 osteomyelitis, PAD, foot ulcer). She saw Dr. Mirna previously. Last visit with me 3.5 months ago.  Interim history: No increased urination, blurry vision, nausea, chest pain. She continues to have pain in her knees and hips.  She walks with a cane.  She cannot exercise.  Reviewed her HbA1c levels: Lab Results  Component Value Date   HGBA1C 6.8 (A) 10/18/2023   HGBA1C 7.0 (A) 06/03/2023   HGBA1C 7.5 (A) 01/09/2023   HGBA1C 7.1 (A) 09/04/2022   HGBA1C 6.8 (A) 05/04/2022   HGBA1C 7.0 (A) 01/01/2022   HGBA1C 7.6 (A) 08/30/2021   HGBA1C 8.5 (A) 05/31/2021   HGBA1C 8.5 (A) 02/21/2021   HGBA1C 7.8 (H) 12/14/2020   HGBA1C 8.4 (A) 09/30/2020   HGBA1C 8.1 (A) 06/30/2020   HGBA1C 9.4 (A) 04/22/2020   HGBA1C 9.1 (A) 01/20/2020   HGBA1C 10.3 (A) 09/29/2019   HGBA1C 9.5 (A) 07/01/2019   HGBA1C 8.4 (A) 02/04/2019   HGBA1C 8.0 (A) 09/30/2018   HGBA1C 7.1 (A) 05/29/2018   HGBA1C 6.7 (A) 01/27/2018  09/30/2020: HbA1c calculated from fructosamine is much better, at 6.6%, correlating more with her blood sugars at home. 02/16/2016: 7.1% 06/2015: 6.6%  Pt is on a regimen of: - Jardiance  25 >> 10 mg daily in am - Glipizide  5 mg 2x a day before meals >>  5 mg before dinner - Tresiba  20 >> ... 52 >> 46 >> 54-56 >> 54 units daily  - Novolog  7 to 10 >> ... 12-16 (20) >> 14-16 units before each meal  She stopped Ozempic  04/2019 due to GI symptoms.  We retried it in 09/2019 with same symptoms so he had to stop. She stopped Metformin  ER >> AP and diarrhea She was on Victoza >> rash. She tried Mounjaro  06/2023 >> AP.   We tried to get her a CGM and she was able to start this - discrepant values, also a  rash.  Pt checks her sugars 3-4 times a day: - am: 120-160, 174 >> 115-160, 176 >> 130-165, 177 >> 76, 93-156 - 2h after b'fast: 67, 125-160 >> 111-151 >> 138-150, 177 >> 111-151 - before lunch:92-162 >> 130-150, 266 >> 130-150, 160 >> 74, 150, 163 - 2h after lunch: 130-170 >> 125-150, 200 >> 130-150 >> 123-150 - before dinner: 170 >> 143-161, 173 >> 145-160 >> 119, 120, 130 - 2h after dinner: 120, 145-170, 210 >> 119-175 >> 140-194 >> 130-150, 214 - bedtime: 110-140 >> 145-177, 216 >> 181 >> 170 >> 119, 126 - nighttime: n/c >> 76 x1 Lowest sugar was 59 >> 67 >> 94 >> 74; she has hypoglycemia awareness in the 90s. Highest sugar was  420 (steroids) >> ... 266 >> 194.  Glucometer: One Touch Verio  -+ CKD -  now sees Dr. Gearline with nephrology, last BUN/creatinine: Lab Results  Component Value Date   BUN 50 (H) 07/29/2023   BUN 46 (H) 06/03/2023   CREATININE 1.31 (H) 07/29/2023   CREATININE 1.25 (H) 06/03/2023  01/08/2023: 42/1.39, GFR 41, glucose 196 06/22/2022: 62/1.51, GFR 37, glucose 166  06/11/2023: ACR 820 (PCP's office) Lab Results  Component Value Date   MICRALBCREAT 1,500 (H) 06/03/2023  On lisinopril .  -+ HL;  last set of lipids: Lab Results  Component Value Date   CHOL 107 06/03/2023   HDL 33 (L) 06/03/2023   LDLCALC 47 06/03/2023   LDLDIRECT 44.0 02/04/2019   TRIG 206 (H) 06/03/2023   CHOLHDL 3.2 06/03/2023  06/07/2022: 90/212/28/29 On Zocor  40.  - last eye exam was 11/2023: No DR reportedly (Dr. Charmayne).  She has a history of cataract surgery.   -She has numbness and tingling in her feet.  She sees podiatry -Dr. Gershon.  Latest foot exam was performed on 01/20/2024. She had amputation of 1/2 of the left hallux 05/06/2017.  She had osteomyelitis of the right second toe and she is status post left partial fourth toe amputation on 06/28/2021.  She also has a history of HTN, GERD, anemia. She started Humira (for psoriasis) but did not tolerate this >> stopped. She was  in PT for her back pain. Also had dry needling.   ROS: + See HPI  I reviewed pt's medications, allergies, PMH, social hx, family hx, and changes were documented in the history of present illness. Otherwise, unchanged from my initial visit note.  Past Medical History:  Diagnosis Date   Allergy    seasonal   Anemia in chronic renal disease 06/05/2011   Anxiety    takes Ativan  tid   Asthma    has an inhaler prn   Chronic kidney disease    Cough    DDD (degenerative disc disease), cervical 06/05/2011   DDD (degenerative disc disease), lumbosacral 06/05/2011   Depression    takes Cymbalta  daily   Diabetes mellitus    takes Amaryl  and Metformin  daily   Diabetic neuropathy (HCC)    Diverticulosis    Dizziness    GERD (gastroesophageal reflux disease)    takes Protonix  prn   Headache(784.0)    occasionally   History of bronchitis    last time 61yrs ago   History of colon polyps    History of MRSA infection    HTN (hypertension) 06/05/2011   takes Prinizide daily   Hyperlipidemia    takes Zocor  daily   Insomnia    not on any meds at present time   Osteoarthritis    PONV (postoperative nausea and vomiting)    hard to wake up   Poorly controlled type 2 diabetes mellitus with circulatory disorder (HCC) 02/21/2021   Psoriasis 06/05/2011   on legs and uses cream prn   Seasonal allergies    but doesn't take any meds   Sleep apnea    uses CPAP   Tubular adenoma of colon    Uncontrolled type 2 diabetes mellitus with peripheral neuropathy 05/10/2016   Past Surgical History:  Procedure Laterality Date   ABDOMINAL HYSTERECTOMY  1982 and 1984   AMPUTATION  02/12/2012   Procedure: AMPUTATION RAY;  Surgeon: Norleen Armor, MD;  Location: [facility] OR;  Service: Orthopedics;  Laterality: Right;  RIGHT 2ND TOE AMPUTATION    ANTERIOR LAT LUMBAR FUSION N/A 12/22/2020   Procedure: Thoracic twelve to Lumbar one posterior lateral lumbar interbody fusion with pedicle screw fixation Thoracic ten to  Lumbar two with robot, posterior arthrodesis with allograft from Thoracic ten to Lumbar two;  Surgeon: Colon Shove, MD;  Location: [facility] OR;  Service: Neurosurgery;  Laterality: N/A;   APPLICATION OF ROBOTIC ASSISTANCE FOR SPINAL PROCEDURE N/A 12/22/2020   Procedure: APPLICATION OF ROBOTIC ASSISTANCE FOR SPINAL PROCEDURE;  Surgeon: Colon Shove, MD;  Location: [facility] OR;  Service: Neurosurgery;  Laterality: N/A;  3C/RM 19  BACK SURGERY     x 3   CARDIAC CATHETERIZATION     08/09/04: 50% mid AV groove CX lesion, NL LM, LAD, Ramus, EF > 60% (Dr. Court)   CARPAL TUNNEL RELEASE Bilateral 2010   CERVICAL SPINE SURGERY     CHOLECYSTECTOMY  1977   COLONOSCOPY  05/30/2009, 09/30/13   [facility] 2011/Dr.Brodie 2015   COLONOSCOPY W/ POLYPECTOMY  06/23/2019   DILATION AND CURETTAGE OF UTERUS     ELBOW SURGERY     left d/t tendonitis   ESOPHAGOGASTRODUODENOSCOPY     LUMBAR WOUND DEBRIDEMENT N/A 07/15/2012   Procedure: I & D of Lumbar Wound: Possible Repair of CSF Leak;  Surgeon: Arley SHAUNNA Helling, MD;  Location: [facility];  Service: Neurosurgery;  Laterality: N/A;  I & D of Lumbar Wound: Possible Repair of CSF Leak placement lumbar drain   POLYPECTOMY     right knee arthroscopy     x 2   SHOULDER ARTHROSCOPY Left    TOE AMPUTATION Bilateral    2nd toe   Social History   Social History   Marital status: Married    Spouse name: N/A   Number of children: 2   Occupational History   Disabled    Social History Main Topics   Smoking status: Never Smoker   Smokeless tobacco: Never Used     Comment: never used tobacco   Alcohol use No   Drug use: No   Current Outpatient Medications on File Prior to Visit  Medication Sig Dispense Refill   ammonium lactate  (AMLACTIN) 12 % cream Apply 1 Application topically as needed for dry skin. 385 g 0   aspirin  81 MG tablet Take 81 mg by mouth daily.     augmented betamethasone  dipropionate (DIPROLENE -AF) 0.05 % cream Apply topically 2 (two) times daily as needed.      budesonide-formoterol (SYMBICORT) 160-4.5 MCG/ACT inhaler Inhale 1 puff into the lungs 2 (two) times daily as needed (shortness of breath).     Cholecalciferol (VITAMIN D) 50 MCG (2000 UT) tablet Take 2,000 Units by mouth daily.     COLLAGEN PO Take 2 capsules by mouth daily.     COSENTYX UNOREADY 300 MG/2ML SOAJ Subcutaneous     DULoxetine  (CYMBALTA ) 30 MG capsule TAKE 2 CAPSULE BY MOUTH EVERY MORNING AND 1 CAPSULE BY MOUTH EVERY EVENING; Duration: 90     gabapentin  (NEURONTIN ) 100 MG capsule TAKE 1 CAPSULE(100 MG) BY MOUTH AT BEDTIME 30 capsule 0   glipiZIDE  (GLUCOTROL ) 5 MG tablet Take 1 tablet (5 mg total) by mouth daily before supper. 90 tablet 3   Insulin  Pen Needle (BD PEN NEEDLE MINI ULTRAFINE) 31G X 5 MM MISC USE AS DIRECTED FOR UP TO 4 NEEDLES TO INJECT INSULIN  400 each 3   Insulin  Syringe-Needle U-100 (ULTICARE INSULIN  SYRINGE) 31G X 5/16 1 ML MISC USE 3 SYRINGES DAILY TO INJECT MEALTIME INSULIN  300 each 3   JARDIANCE  10 MG TABS tablet Take 10 mg by mouth daily.     Lancets (ONETOUCH ULTRASOFT) lancets      lisinopril  (ZESTRIL ) 20 MG tablet Take 20 mg by mouth daily.     lisinopril -hydrochlorothiazide  (ZESTORETIC ) 20-12.5 MG tablet Take 2 tablets by mouth daily.     metoprolol  succinate (TOPROL -XL) 100 MG 24 hr tablet Take 100 mg by mouth daily. Take with or immediately following a meal.     mupirocin  ointment (BACTROBAN ) 2 % Apply 1 Application topically 2 (two) times daily. 30 g 2   Needle, Disp,  32G X 5/16 MISC by Does not apply route.     NOVOLOG  100 UNIT/ML injection ADMINISTER 12 TO 20 UNITS UNDER THE SKIN THREE TIMES DAILY BEFORE MEALS 45 mL 3   ofloxacin  (FLOXIN ) 0.3 % OTIC solution SMARTSIG:In Ear(s)     ONETOUCH VERIO test strip USE AS DIRECTED TWICE DAILY 200 strip 3   pantoprazole  (PROTONIX ) 40 MG tablet Take 40 mg by mouth daily.     Probiotic Product (PROBIOTIC DAILY PO) Take 1 capsule by mouth daily.      risankizumab-rzaa (SKYRIZI) 150 MG/ML pen Inject 150 mg into  the skin.     simvastatin  (ZOCOR ) 40 MG tablet Take 40 mg by mouth daily in the afternoon.     TRESIBA  FLEXTOUCH 200 UNIT/ML FlexTouch Pen ADMINISTER 60 UNITS UNDER THE SKIN DAILY AT BEDTIME 18 mL 1   No current facility-administered medications on file prior to visit.   Allergies  Allergen Reactions   Liraglutide Hives and Rash   Hydromorphone  Diarrhea, Hives, Rash and Swelling    Other Reaction(s): hives/swelling   Tramadol  Rash    Other Reaction(s): hives, itching and nasuea   Acetaminophen      Pt was told not to take   Adalimumab Hives and Dermatitis   Betadine [Povidone Iodine] Other (See Comments)    Skin peels   Bupropion Hcl     Other Reaction(s): ichy and hives   Chlorhexidine      Exacerbates psoriasis    Codeine Other (See Comments)    Severe headaches  Other Reaction(s): headaches   Diazepam     Other Reaction(s): headaches   Exenatide Diarrhea, Nausea And Vomiting and Dermatitis   Fluoxetine Other (See Comments)    Other Reaction(s): flat affect   Hydrocodone Other (See Comments)    headache   Hydrocodone-Acetaminophen      Other Reaction(s): headaches   Iodinated Contrast Media Nausea And Vomiting    Other Reaction(s): rash, sweating, weakness   Iodine Hives    Other Reaction(s): rash, sweating   Morphine And Codeine Nausea And Vomiting   Morphine Sulfate     Other Reaction(s): headaches   Olanzapine Other (See Comments)    Other Reaction(s): swelling and increaed BS   Other Dermatitis    Darvocet-N 100   Oxycodone -Acetaminophen      Other Reaction(s): headaches, rash   Ozempic  (0.25 Or 0.5 Mg-Dose) [Semaglutide (0.25 Or 0.5mg -Dos)] Nausea And Vomiting   Percocet [Oxycodone -Acetaminophen ] Other (See Comments)    Headache    Povidone-Iodine Dermatitis   Prednisone  Diarrhea and Nausea And Vomiting    Other Reaction(s): severe diarrhea and vomiting   Sitagliptin Diarrhea   Sulfa  Antibiotics Itching   Valium Other (See Comments)    headache    Vortioxetine Other (See Comments)    Other Reaction(s): itching   Amoxicillin  Hives, Rash and Dermatitis    Did it involve swelling of the face/tongue/throat, SOB, or low BP? Unknown  Did it involve sudden or severe rash/hives, skin peeling, or any reaction on the inside of your mouth or nose? Yes  Did you need to seek medical attention at a hospital or doctor's office? Yes  When did it last happen?      unk  If all above answers are "NO", may proceed with cephalosporin use.   Canagliflozin  Other (See Comments) and Rash    Cannot take this 2/2 PAD and toe amputations   Darvocet [Propoxyphene N-Acetaminophen ] Rash and Other (See Comments)    headache   Fentanyl  And Related Rash   Sulfamethoxazole -Trimethoprim   Rash and Dermatitis   Family History  Problem Relation Age of Onset   Hypertension Mother        died from infection s/p hip surgery   Heart attack Father    Colon polyps Brother    Colon cancer Brother 65   Colon cancer Maternal Grandfather    Stomach cancer Neg Hx    Esophageal cancer Neg Hx    Rectal cancer Neg Hx    Liver cancer Neg Hx    PE: BP (!) 146/82   Pulse 70   Wt 235 lb (106.6 kg)   SpO2 96%   BMI 41.63 kg/m  Wt Readings from Last 15 Encounters:  02/04/24 235 lb (106.6 kg)  12/12/23 233 lb (105.7 kg)  11/18/23 237 lb 9.6 oz (107.8 kg)  10/18/23 237 lb 9.6 oz (107.8 kg)  07/29/23 237 lb (107.5 kg)  07/25/23 230 lb (104.3 kg)  07/16/23 230 lb (104.3 kg)  06/03/23 241 lb 6.4 oz (109.5 kg)  01/09/23 243 lb 12.8 oz (110.6 kg)  09/07/22 240 lb (108.9 kg)  05/04/22 240 lb 9.6 oz (109.1 kg)  01/01/22 233 lb 9.6 oz (106 kg)  08/30/21 237 lb 3.2 oz (107.6 kg)  06/01/21 230 lb (104.3 kg)  05/31/21 237 lb 3.2 oz (107.6 kg)   Constitutional: overweight, in NAD, walks with a cane Eyes: no exophthalmos ENT: no masses palpated in neck, no cervical lymphadenopathy Cardiovascular: RRR, No MRG Respiratory: CTA B Musculoskeletal: no deformities Skin: no  rashes Neurological: no tremor with outstretched hands  ASSESSMENT: 1. DM2, insulin -dependent, controlled, with complications: - PN - s/p B 2nd toe amputations 2/2 osteomyelitis - 2013 and 2015; 1/2 hallux 04/2017 - PAD  - CKD - foot ulcer  2. HL  3.  Obesity class II  PLAN:  1. Patient with longstanding, uncontrolled, type 2 diabetes, on sulfonylurea, SGLT2 inhibitor and basal-bolus insulin  with improved control at last visit.  HbA1c at that time was 6.8%, lower.  Blood sugars appears to be stable, still slightly above target in the morning but with the HPA improving, I did not suggest a change in regimen.  We were trying to avoid low blood sugars. - Of note, she previously had abdominal pain with Ozempic , possibly due to diverticulitis.  She now tolerates it well. - At today's visit, sugars appear to be improved from before, but she did have higher numbers especially when she had an ear infection recently and when she was on a steroid injection.  As of now, in the last month she had 2 blood sugars in the 70s, but otherwise the majority of the blood sugars appear to be at goal.  I did recommend a slight decrease in Tresiba  dose but otherwise we can continue the rest of the regimen. -I advised her to: Patient Instructions  Please continue: - Jardiance  10 mg daily before b'fast - Glipizide  5 mg before dinner  - Novolog  14-16 units 15 min before a meal 3x a day (up to 20 units with steroids)  Please reduce: - Tresiba  50 units at bedtime    Please come back for a follow-up appointment in 3-4 months.   - we checked her HbA1c: 6.9% (slightly higher) - advised to check sugars at different times of the day - 4x a day, rotating check times - advised for yearly eye exams >> she is UTD - return to clinic in 3-4 months.  2. HL - Latest lipid panel was reviewed from 05/2023: LDL at goal, triglycerides elevated,  HDL slightly low: Lab Results  Component Value Date   CHOL 107 06/03/2023    HDL 33 (L) 06/03/2023   LDLCALC 47 06/03/2023   LDLDIRECT 44.0 02/04/2019   TRIG 206 (H) 06/03/2023   CHOLHDL 3.2 06/03/2023  - Continues Zocor  40 mg - no SEs  3.  Obesity class III - He is on an SGLT2 inhibitor which should also help with weight loss.  She could not tolerate Mounjaro , which she wanted to try at last visit, due to abdominal pain (CT normal). - Unfortunately, she could not tolerate Ozempic  due to diverticulitis and abdominal pain - She was previously going to the weight management clinic-was on a 1400-calorie/day diet.  She came off the diet - She lost 4 pounds before last visit and 2 since then  + Flu shot today  Lela Fendt, MD PhD [facility] Endocrinology

## 2024-02-06 ENCOUNTER — Other Ambulatory Visit: Payer: Self-pay | Admitting: Podiatry

## 2024-02-12 DIAGNOSIS — M25551 Pain in right hip: Secondary | ICD-10-CM | POA: Diagnosis not present

## 2024-02-12 DIAGNOSIS — M170 Bilateral primary osteoarthritis of knee: Secondary | ICD-10-CM | POA: Diagnosis not present

## 2024-02-24 ENCOUNTER — Ambulatory Visit: Admitting: Podiatry

## 2024-02-27 DIAGNOSIS — N183 Chronic kidney disease, stage 3 unspecified: Secondary | ICD-10-CM | POA: Diagnosis not present

## 2024-03-03 DIAGNOSIS — M1611 Unilateral primary osteoarthritis, right hip: Secondary | ICD-10-CM | POA: Diagnosis not present

## 2024-03-10 DIAGNOSIS — M1611 Unilateral primary osteoarthritis, right hip: Secondary | ICD-10-CM | POA: Diagnosis not present

## 2024-03-18 DIAGNOSIS — I129 Hypertensive chronic kidney disease with stage 1 through stage 4 chronic kidney disease, or unspecified chronic kidney disease: Secondary | ICD-10-CM | POA: Diagnosis not present

## 2024-03-18 DIAGNOSIS — E1122 Type 2 diabetes mellitus with diabetic chronic kidney disease: Secondary | ICD-10-CM | POA: Diagnosis not present

## 2024-03-18 DIAGNOSIS — R809 Proteinuria, unspecified: Secondary | ICD-10-CM | POA: Diagnosis not present

## 2024-03-18 DIAGNOSIS — N1831 Chronic kidney disease, stage 3a: Secondary | ICD-10-CM | POA: Diagnosis not present

## 2024-03-23 ENCOUNTER — Ambulatory Visit: Admitting: Podiatry

## 2024-04-06 ENCOUNTER — Ambulatory Visit: Admitting: Podiatry

## 2024-04-06 DIAGNOSIS — M79675 Pain in left toe(s): Secondary | ICD-10-CM

## 2024-04-06 DIAGNOSIS — M79674 Pain in right toe(s): Secondary | ICD-10-CM

## 2024-04-06 DIAGNOSIS — S96911S Strain of unspecified muscle and tendon at ankle and foot level, right foot, sequela: Secondary | ICD-10-CM | POA: Diagnosis not present

## 2024-04-06 DIAGNOSIS — B351 Tinea unguium: Secondary | ICD-10-CM

## 2024-04-06 DIAGNOSIS — R21 Rash and other nonspecific skin eruption: Secondary | ICD-10-CM

## 2024-04-06 DIAGNOSIS — E1149 Type 2 diabetes mellitus with other diabetic neurological complication: Secondary | ICD-10-CM

## 2024-04-06 DIAGNOSIS — M2141 Flat foot [pes planus] (acquired), right foot: Secondary | ICD-10-CM | POA: Diagnosis not present

## 2024-04-06 MED ORDER — TRIAMCINOLONE ACETONIDE 0.1 % EX CREA: 1.0000 | TOPICAL_CREAM | Freq: Two times a day (BID) | CUTANEOUS | 0 refills | Status: AC

## 2024-04-06 NOTE — Progress Notes (Unsigned)
 Subjective: Chief Complaint  Patient presents with   Nail Problem    Pt stated that she is here to have her nails trimmed     73 year old female presents the office today with above concerns.  She states the ankle was feeling well she was not on these gabapentin  or the ankle brace.  She was going to physical therapy for sciatica and she states that they caused increased pain not just to her ankle but other parts of her body.  She has discontinued therapy.  Starting to feel better again.  No other injuries.  Also has a rash on her feet.  She does have a history of psoriasis.  She is scheduled see dermatology in 2 weeks.  No open lesions.  Objective: AAO x3, NAD DP/PT pulses palpable bilaterally, CRT less than 3 seconds Sensation decreased with Semmes Weinstein monofilament. Not able to see any open lesions today.  There is digital deformities present.  Toenails are hypertrophic, dystrophic, elongated with yellow discoloration to nails x 4 on the right foot.  There are no open lesions identified. Dry skin is present without any open lesions or skin fissures today. As pictured below there is an erythematous scaly rash present bilateral lower extremities.  No open lesions or drainage. Mild discomfort along the lateral aspect the right ankle in the course of the peroneal tendon.  Minimal edema is present there is no area of pinpoint tenderness.  Ankle, subtalar joint range of motion intact. No pain with calf compression, swelling, warmth, erythema       Assessment: Flatfoot deformity significant arthritis, chronic foot pain; symptomatic onychomycosis; skin rash  Plan: Right ankle pain -Continue with conservative management for now.  Recommend return to wearing the brace.  She is back to using gabapentin .  She was alert upon further physical therapy.  Discussed as she starts to improve she can gradually start to increase home rehab exercises for her ankle.  If needed we can [place] repeat  the MRI if she continues to have pain.  Symptomatic onychomycosis - Sharply debrided nails x 4 on the right foot any complications or bleeding  Skin rash, possible psoriasis - Prescribed triamcinolone  cream.  Recommended follow-up with her dermatologist in 2 weeks as scheduled.  Return in about 9 weeks (around 06/08/2024).  [name] Fees DPM

## 2024-04-15 DIAGNOSIS — M1711 Unilateral primary osteoarthritis, right knee: Secondary | ICD-10-CM | POA: Diagnosis not present

## 2024-04-15 DIAGNOSIS — M5440 Lumbago with sciatica, unspecified side: Secondary | ICD-10-CM | POA: Diagnosis not present

## 2024-05-01 ENCOUNTER — Encounter: Payer: Self-pay | Admitting: Internal Medicine

## 2024-05-01 DIAGNOSIS — E1159 Type 2 diabetes mellitus with other circulatory complications: Secondary | ICD-10-CM

## 2024-05-04 MED ORDER — GLIPIZIDE 5 MG PO TABS: 5.0000 mg | ORAL_TABLET | Freq: Every day | ORAL | 3 refills | Status: AC

## 2024-06-08 ENCOUNTER — Ambulatory Visit: Admitting: Internal Medicine

## 2024-06-11 ENCOUNTER — Ambulatory Visit: Admitting: Podiatry

## 2024-06-15 ENCOUNTER — Ambulatory Visit: Admitting: Internal Medicine

## 2024-06-29 ENCOUNTER — Ambulatory Visit: Admitting: Podiatry

## 2024-07-22 ENCOUNTER — Ambulatory Visit (INDEPENDENT_AMBULATORY_CARE_PROVIDER_SITE_OTHER): Admitting: Otolaryngology
